# Patient Record
Sex: Male | Born: 1935
Health system: Southern US, Community
[De-identification: ages and names within clinical notes are randomized; demographics above are authoritative.]

## PROBLEM LIST (undated history)

## (undated) DIAGNOSIS — K589 Irritable bowel syndrome without diarrhea: Secondary | ICD-10-CM

## (undated) DIAGNOSIS — J310 Chronic rhinitis: Secondary | ICD-10-CM

## (undated) DIAGNOSIS — I10 Essential (primary) hypertension: Secondary | ICD-10-CM

## (undated) DIAGNOSIS — F419 Anxiety disorder, unspecified: Secondary | ICD-10-CM

## (undated) DIAGNOSIS — K5730 Diverticulosis of large intestine without perforation or abscess without bleeding: Secondary | ICD-10-CM

## (undated) DIAGNOSIS — I2510 Atherosclerotic heart disease of native coronary artery without angina pectoris: Secondary | ICD-10-CM

## (undated) DIAGNOSIS — Z87438 Personal history of other diseases of male genital organs: Secondary | ICD-10-CM

## (undated) DIAGNOSIS — I6529 Occlusion and stenosis of unspecified carotid artery: Secondary | ICD-10-CM

## (undated) DIAGNOSIS — Z9289 Personal history of other medical treatment: Secondary | ICD-10-CM

## (undated) DIAGNOSIS — K635 Polyp of colon: Secondary | ICD-10-CM

## (undated) DIAGNOSIS — E785 Hyperlipidemia, unspecified: Secondary | ICD-10-CM

## (undated) DIAGNOSIS — I6381 Other cerebral infarction due to occlusion or stenosis of small artery: Secondary | ICD-10-CM

## (undated) HISTORY — DX: Chronic rhinitis: J31.0

## (undated) HISTORY — DX: Atherosclerotic heart disease of native coronary artery without angina pectoris: I25.10

## (undated) HISTORY — DX: Personal history of other diseases of male genital organs: Z87.438

## (undated) HISTORY — DX: Anxiety disorder, unspecified: F41.9

## (undated) HISTORY — DX: Occlusion and stenosis of unspecified carotid artery: I65.29

## (undated) HISTORY — DX: Essential (primary) hypertension: I10

## (undated) HISTORY — DX: Polyp of colon: K63.5

## (undated) HISTORY — DX: Irritable bowel syndrome, unspecified: K58.9

## (undated) HISTORY — DX: Diverticulosis of large intestine without perforation or abscess without bleeding: K57.30

## (undated) HISTORY — DX: Hyperlipidemia, unspecified: E78.5

## (undated) HISTORY — DX: Personal history of other medical treatment: Z92.89

## (undated) HISTORY — DX: Other cerebral infarction due to occlusion or stenosis of small artery: I63.81

---
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
## 1973-05-17 HISTORY — PX: NASAL SINUS SURGERY: SHX719

## 1986-07-12 ENCOUNTER — Encounter (INDEPENDENT_AMBULATORY_CARE_PROVIDER_SITE_OTHER): Payer: Self-pay | Admitting: *Deleted

## 1993-02-16 ENCOUNTER — Encounter: Payer: Self-pay | Admitting: Internal Medicine

## 1993-04-20 ENCOUNTER — Encounter: Payer: Self-pay | Admitting: Internal Medicine

## 1997-10-04 ENCOUNTER — Encounter: Payer: Self-pay | Admitting: Internal Medicine

## 2001-09-28 ENCOUNTER — Encounter: Payer: Self-pay | Admitting: Internal Medicine

## 2003-01-23 ENCOUNTER — Encounter: Payer: Self-pay | Admitting: Internal Medicine

## 2003-02-01 ENCOUNTER — Ambulatory Visit (HOSPITAL_COMMUNITY): Admission: RE | Admit: 2003-02-01 | Discharge: 2003-02-01 | Payer: Self-pay | Admitting: Internal Medicine

## 2003-02-01 ENCOUNTER — Encounter: Payer: Self-pay | Admitting: Internal Medicine

## 2003-03-22 ENCOUNTER — Ambulatory Visit (HOSPITAL_COMMUNITY): Admission: RE | Admit: 2003-03-22 | Discharge: 2003-03-22 | Payer: Self-pay | Admitting: General Surgery

## 2003-05-18 HISTORY — PX: HERNIA REPAIR: SHX51

## 2004-03-03 ENCOUNTER — Ambulatory Visit: Payer: Self-pay | Admitting: Pain Medicine

## 2004-03-30 ENCOUNTER — Ambulatory Visit: Payer: Self-pay | Admitting: Internal Medicine

## 2004-04-14 ENCOUNTER — Ambulatory Visit: Payer: Self-pay | Admitting: Physician Assistant

## 2004-06-30 ENCOUNTER — Ambulatory Visit: Payer: Self-pay | Admitting: Physician Assistant

## 2004-08-05 ENCOUNTER — Ambulatory Visit: Payer: Self-pay | Admitting: Internal Medicine

## 2004-08-07 ENCOUNTER — Ambulatory Visit: Payer: Self-pay | Admitting: Internal Medicine

## 2004-08-10 ENCOUNTER — Ambulatory Visit: Payer: Self-pay | Admitting: Internal Medicine

## 2004-09-25 ENCOUNTER — Ambulatory Visit: Payer: Self-pay | Admitting: Physician Assistant

## 2005-03-12 ENCOUNTER — Ambulatory Visit: Payer: Self-pay | Admitting: Internal Medicine

## 2005-03-16 ENCOUNTER — Encounter: Admission: RE | Admit: 2005-03-16 | Discharge: 2005-03-16 | Payer: Self-pay | Admitting: Otolaryngology

## 2005-03-23 ENCOUNTER — Encounter (INDEPENDENT_AMBULATORY_CARE_PROVIDER_SITE_OTHER): Payer: Self-pay | Admitting: *Deleted

## 2005-03-23 ENCOUNTER — Ambulatory Visit: Payer: Self-pay | Admitting: Internal Medicine

## 2005-04-29 ENCOUNTER — Ambulatory Visit: Payer: Self-pay | Admitting: Internal Medicine

## 2005-05-02 ENCOUNTER — Encounter: Admission: RE | Admit: 2005-05-02 | Discharge: 2005-05-02 | Payer: Self-pay | Admitting: Internal Medicine

## 2005-09-03 ENCOUNTER — Ambulatory Visit: Payer: Self-pay | Admitting: Physician Assistant

## 2005-09-08 ENCOUNTER — Ambulatory Visit: Payer: Self-pay | Admitting: Physician Assistant

## 2005-09-15 ENCOUNTER — Encounter: Payer: Self-pay | Admitting: Pain Medicine

## 2005-10-25 ENCOUNTER — Ambulatory Visit: Payer: Self-pay | Admitting: Internal Medicine

## 2005-12-21 ENCOUNTER — Ambulatory Visit: Payer: Self-pay | Admitting: Physician Assistant

## 2006-02-21 ENCOUNTER — Ambulatory Visit: Payer: Self-pay | Admitting: Internal Medicine

## 2006-04-22 ENCOUNTER — Ambulatory Visit: Payer: Self-pay | Admitting: Physician Assistant

## 2006-05-11 ENCOUNTER — Ambulatory Visit: Payer: Self-pay | Admitting: Internal Medicine

## 2006-05-17 HISTORY — PX: OTHER SURGICAL HISTORY: SHX169

## 2006-05-17 HISTORY — PX: CORONARY STENT PLACEMENT: SHX1402

## 2006-06-20 ENCOUNTER — Ambulatory Visit: Payer: Self-pay | Admitting: Internal Medicine

## 2006-06-21 ENCOUNTER — Ambulatory Visit: Payer: Self-pay | Admitting: Internal Medicine

## 2006-06-21 LAB — CONVERTED CEMR LAB
BUN: 14 mg/dL (ref 6–23)
CO2: 30 meq/L (ref 19–32)
Calcium: 9 mg/dL (ref 8.4–10.5)
Chloride: 107 meq/L (ref 96–112)
Cortisol, Plasma: 12.1 ug/dL
Creatinine, Ser: 0.9 mg/dL (ref 0.4–1.5)
GFR calc Af Amer: 107 mL/min
GFR calc non Af Amer: 88 mL/min
Glucose, Bld: 84 mg/dL (ref 70–99)
Potassium: 4.1 meq/L (ref 3.5–5.1)
Sodium: 141 meq/L (ref 135–145)

## 2006-07-08 ENCOUNTER — Ambulatory Visit: Payer: Self-pay | Admitting: Internal Medicine

## 2006-08-15 ENCOUNTER — Emergency Department (HOSPITAL_COMMUNITY): Admission: EM | Admit: 2006-08-15 | Discharge: 2006-08-16 | Payer: Self-pay | Admitting: Emergency Medicine

## 2006-09-20 ENCOUNTER — Ambulatory Visit: Payer: Self-pay | Admitting: Internal Medicine

## 2006-09-20 LAB — CONVERTED CEMR LAB
Anti Nuclear Antibody(ANA): NEGATIVE
Basophils Absolute: 0 10*3/uL (ref 0.0–0.1)
Basophils Relative: 0.7 % (ref 0.0–1.0)
Eosinophils Absolute: 0.1 10*3/uL (ref 0.0–0.6)
Eosinophils Relative: 2.5 % (ref 0.0–5.0)
HCT: 41.1 % (ref 39.0–52.0)
Hemoglobin: 14.4 g/dL (ref 13.0–17.0)
Lymphocytes Relative: 21.3 % (ref 12.0–46.0)
MCHC: 34.9 g/dL (ref 30.0–36.0)
MCV: 90 fL (ref 78.0–100.0)
Monocytes Absolute: 0.4 10*3/uL (ref 0.2–0.7)
Monocytes Relative: 7.8 % (ref 3.0–11.0)
Neutro Abs: 3.9 10*3/uL (ref 1.4–7.7)
Neutrophils Relative %: 67.7 % (ref 43.0–77.0)
Platelets: 252 10*3/uL (ref 150–400)
RBC: 4.57 M/uL (ref 4.22–5.81)
RDW: 12.2 % (ref 11.5–14.6)
Sed Rate: 10 mm/hr (ref 0–20)
WBC: 5.6 10*3/uL (ref 4.5–10.5)

## 2006-10-13 ENCOUNTER — Ambulatory Visit: Payer: Self-pay | Admitting: Internal Medicine

## 2007-02-06 ENCOUNTER — Encounter: Payer: Self-pay | Admitting: *Deleted

## 2007-02-06 DIAGNOSIS — K589 Irritable bowel syndrome without diarrhea: Secondary | ICD-10-CM

## 2007-02-06 DIAGNOSIS — Z87898 Personal history of other specified conditions: Secondary | ICD-10-CM

## 2007-02-06 DIAGNOSIS — I10 Essential (primary) hypertension: Secondary | ICD-10-CM | POA: Insufficient documentation

## 2007-03-10 ENCOUNTER — Ambulatory Visit: Payer: Self-pay | Admitting: Internal Medicine

## 2007-03-10 LAB — CONVERTED CEMR LAB
ALT: 22 units/L (ref 0–53)
AST: 23 units/L (ref 0–37)
Albumin: 3.6 g/dL (ref 3.5–5.2)
Alkaline Phosphatase: 59 units/L (ref 39–117)
BUN: 12 mg/dL (ref 6–23)
Basophils Absolute: 0 10*3/uL (ref 0.0–0.1)
Basophils Relative: 0 % (ref 0.0–1.0)
Bilirubin Urine: NEGATIVE
Bilirubin, Direct: 0.1 mg/dL (ref 0.0–0.3)
CO2: 26 meq/L (ref 19–32)
Calcium: 9.4 mg/dL (ref 8.4–10.5)
Chloride: 107 meq/L (ref 96–112)
Cholesterol: 226 mg/dL (ref 0–200)
Creatinine, Ser: 1 mg/dL (ref 0.4–1.5)
Direct LDL: 132.2 mg/dL
Eosinophils Absolute: 0.2 10*3/uL (ref 0.0–0.6)
Eosinophils Relative: 3.5 % (ref 0.0–5.0)
GFR calc Af Amer: 95 mL/min
GFR calc non Af Amer: 78 mL/min
Glucose, Bld: 100 mg/dL — ABNORMAL HIGH (ref 70–99)
HCT: 41.4 % (ref 39.0–52.0)
HDL: 56 mg/dL (ref >39.0)
Hemoglobin, Urine: NEGATIVE
Hemoglobin: 14.3 g/dL (ref 13.0–17.0)
Ketones, ur: NEGATIVE mg/dL
Leukocytes, UA: NEGATIVE
Lymphocytes Relative: 29.7 % (ref 12.0–46.0)
MCHC: 34.6 g/dL (ref 30.0–36.0)
MCV: 91.2 fL (ref 78.0–100.0)
Monocytes Absolute: 0.4 10*3/uL (ref 0.2–0.7)
Monocytes Relative: 8.6 % (ref 3.0–11.0)
Neutro Abs: 2.6 10*3/uL (ref 1.4–7.7)
Neutrophils Relative %: 58.2 % (ref 43.0–77.0)
Nitrite: NEGATIVE
PSA: 1.58 ng/mL (ref 0.10–4.00)
Platelets: 191 10*3/uL (ref 150–400)
Potassium: 4 meq/L (ref 3.5–5.1)
RBC: 4.53 M/uL (ref 4.22–5.81)
RDW: 12.7 % (ref 11.5–14.6)
Sodium: 140 meq/L (ref 135–145)
Specific Gravity, Urine: 1.025 (ref 1.000–1.03)
TSH: 3.54 microintl units/mL (ref 0.35–5.50)
Total Bilirubin: 0.7 mg/dL (ref 0.3–1.2)
Total CHOL/HDL Ratio: 4
Total Protein, Urine: NEGATIVE mg/dL
Total Protein: 6.2 g/dL (ref 6.0–8.3)
Triglycerides: 184 mg/dL — ABNORMAL HIGH (ref 0–149)
Urine Glucose: NEGATIVE mg/dL
Urobilinogen, UA: 0.2 (ref 0.0–1.0)
VLDL: 37 mg/dL (ref 0–40)
WBC: 4.5 10*3/uL (ref 4.5–10.5)
pH: 6 (ref 5.0–8.0)

## 2007-03-16 ENCOUNTER — Ambulatory Visit: Payer: Self-pay | Admitting: Internal Medicine

## 2007-03-16 ENCOUNTER — Encounter: Payer: Self-pay | Admitting: Internal Medicine

## 2007-03-16 DIAGNOSIS — M542 Cervicalgia: Secondary | ICD-10-CM | POA: Insufficient documentation

## 2007-03-25 ENCOUNTER — Telehealth: Payer: Self-pay | Admitting: Internal Medicine

## 2007-03-25 ENCOUNTER — Observation Stay (HOSPITAL_COMMUNITY): Admission: EM | Admit: 2007-03-25 | Discharge: 2007-03-25 | Payer: Self-pay | Admitting: Emergency Medicine

## 2007-03-25 ENCOUNTER — Ambulatory Visit: Payer: Self-pay | Admitting: Internal Medicine

## 2007-03-25 DIAGNOSIS — I209 Angina pectoris, unspecified: Secondary | ICD-10-CM

## 2007-03-28 ENCOUNTER — Ambulatory Visit: Payer: Self-pay | Admitting: Cardiovascular Disease

## 2007-03-28 ENCOUNTER — Ambulatory Visit: Payer: Self-pay

## 2007-03-28 ENCOUNTER — Encounter: Payer: Self-pay | Admitting: Internal Medicine

## 2007-03-28 ENCOUNTER — Inpatient Hospital Stay (HOSPITAL_COMMUNITY): Admission: AD | Admit: 2007-03-28 | Discharge: 2007-03-30 | Payer: Self-pay | Admitting: Cardiovascular Disease

## 2007-04-05 ENCOUNTER — Ambulatory Visit: Payer: Self-pay | Admitting: Cardiovascular Disease

## 2007-04-05 ENCOUNTER — Ambulatory Visit: Payer: Self-pay

## 2007-04-05 ENCOUNTER — Ambulatory Visit: Payer: Self-pay | Admitting: Cardiology

## 2007-04-11 ENCOUNTER — Encounter (HOSPITAL_COMMUNITY): Admission: RE | Admit: 2007-04-11 | Discharge: 2007-05-17 | Payer: Self-pay | Admitting: Cardiovascular Disease

## 2007-04-18 ENCOUNTER — Ambulatory Visit: Payer: Self-pay | Admitting: Cardiovascular Disease

## 2007-04-26 ENCOUNTER — Ambulatory Visit: Payer: Self-pay | Admitting: Internal Medicine

## 2007-04-26 DIAGNOSIS — I2510 Atherosclerotic heart disease of native coronary artery without angina pectoris: Secondary | ICD-10-CM | POA: Insufficient documentation

## 2007-04-26 DIAGNOSIS — F4321 Adjustment disorder with depressed mood: Secondary | ICD-10-CM

## 2007-04-28 ENCOUNTER — Ambulatory Visit: Payer: Self-pay | Admitting: Cardiovascular Disease

## 2007-06-06 ENCOUNTER — Ambulatory Visit: Payer: Self-pay | Admitting: Cardiovascular Disease

## 2007-06-26 ENCOUNTER — Telehealth: Payer: Self-pay | Admitting: Internal Medicine

## 2007-06-28 ENCOUNTER — Encounter: Payer: Self-pay | Admitting: Internal Medicine

## 2007-07-06 ENCOUNTER — Telehealth: Payer: Self-pay | Admitting: Internal Medicine

## 2007-07-25 ENCOUNTER — Ambulatory Visit: Payer: Self-pay | Admitting: Cardiovascular Disease

## 2007-08-21 ENCOUNTER — Ambulatory Visit: Payer: Self-pay | Admitting: Cardiovascular Disease

## 2007-08-21 LAB — CONVERTED CEMR LAB
ALT: 36 units/L (ref 0–53)
AST: 26 units/L (ref 0–37)
Albumin: 3.6 g/dL (ref 3.5–5.2)
Alkaline Phosphatase: 53 units/L (ref 39–117)
Bilirubin, Direct: 0.1 mg/dL (ref 0.0–0.3)
Cholesterol: 121 mg/dL (ref 0–200)
HDL: 57.4 mg/dL (ref >39.0)
LDL Cholesterol: 37 mg/dL (ref 0–99)
Total Bilirubin: 0.9 mg/dL (ref 0.3–1.2)
Total CHOL/HDL Ratio: 2.1
Total Protein: 6.4 g/dL (ref 6.0–8.3)
Triglycerides: 134 mg/dL (ref 0–149)
VLDL: 27 mg/dL (ref 0–40)

## 2007-09-18 ENCOUNTER — Ambulatory Visit: Payer: Self-pay | Admitting: Internal Medicine

## 2007-09-28 ENCOUNTER — Ambulatory Visit: Payer: Medicare Other | Admitting: Physician Assistant

## 2007-10-04 ENCOUNTER — Telehealth (INDEPENDENT_AMBULATORY_CARE_PROVIDER_SITE_OTHER): Payer: Self-pay | Admitting: *Deleted

## 2007-10-23 ENCOUNTER — Ambulatory Visit: Payer: Self-pay | Admitting: Cardiovascular Disease

## 2007-11-08 ENCOUNTER — Telehealth (INDEPENDENT_AMBULATORY_CARE_PROVIDER_SITE_OTHER): Payer: Self-pay | Admitting: *Deleted

## 2007-11-09 ENCOUNTER — Ambulatory Visit: Payer: Medicare Other | Admitting: Pain Medicine

## 2007-11-16 ENCOUNTER — Ambulatory Visit: Payer: Self-pay | Admitting: Cardiovascular Disease

## 2007-11-16 ENCOUNTER — Encounter: Payer: Self-pay | Admitting: Internal Medicine

## 2007-11-27 ENCOUNTER — Ambulatory Visit: Payer: Medicare Other | Admitting: Physician Assistant

## 2007-12-04 ENCOUNTER — Encounter: Payer: Self-pay | Admitting: Internal Medicine

## 2007-12-14 ENCOUNTER — Ambulatory Visit: Payer: Medicare Other | Admitting: Pain Medicine

## 2008-01-15 ENCOUNTER — Ambulatory Visit: Payer: Self-pay | Admitting: Cardiovascular Disease

## 2008-01-23 ENCOUNTER — Ambulatory Visit: Payer: Self-pay | Admitting: Cardiovascular Disease

## 2008-02-27 ENCOUNTER — Ambulatory Visit: Payer: Self-pay | Admitting: Internal Medicine

## 2008-03-14 ENCOUNTER — Ambulatory Visit: Payer: Medicare Other | Admitting: Pain Medicine

## 2008-04-24 ENCOUNTER — Ambulatory Visit: Payer: Self-pay | Admitting: Internal Medicine

## 2008-04-26 ENCOUNTER — Ambulatory Visit: Payer: Self-pay | Admitting: Internal Medicine

## 2008-04-26 LAB — CONVERTED CEMR LAB
ALT: 34 units/L (ref 0–53)
AST: 24 units/L (ref 0–37)
Albumin: 3.9 g/dL (ref 3.5–5.2)
Alkaline Phosphatase: 50 units/L (ref 39–117)
BUN: 14 mg/dL (ref 6–23)
Bilirubin, Direct: 0.1 mg/dL (ref 0.0–0.3)
CO2: 27 meq/L (ref 19–32)
Calcium: 9.5 mg/dL (ref 8.4–10.5)
Chloride: 105 meq/L (ref 96–112)
Cholesterol: 132 mg/dL (ref 0–200)
Creatinine, Ser: 1.1 mg/dL (ref 0.4–1.5)
GFR calc Af Amer: 85 mL/min
GFR calc non Af Amer: 70 mL/min
Glucose, Bld: 97 mg/dL (ref 70–99)
HDL: 80.2 mg/dL (ref >39.0)
LDL Cholesterol: 36 mg/dL (ref 0–99)
PSA: 0.57 ng/mL (ref 0.10–4.00)
Potassium: 4.1 meq/L (ref 3.5–5.1)
Sodium: 140 meq/L (ref 135–145)
TSH: 1.72 microintl units/mL (ref 0.35–5.50)
Total Bilirubin: 0.7 mg/dL (ref 0.3–1.2)
Total CHOL/HDL Ratio: 1.6
Total Protein: 6.5 g/dL (ref 6.0–8.3)
Triglycerides: 81 mg/dL (ref 0–149)
VLDL: 16 mg/dL (ref 0–40)

## 2008-04-30 ENCOUNTER — Ambulatory Visit: Payer: Self-pay | Admitting: Cardiovascular Disease

## 2008-06-13 ENCOUNTER — Encounter: Payer: Self-pay | Admitting: Internal Medicine

## 2008-06-17 DIAGNOSIS — E785 Hyperlipidemia, unspecified: Secondary | ICD-10-CM

## 2008-06-18 ENCOUNTER — Ambulatory Visit: Payer: Self-pay | Admitting: Cardiovascular Disease

## 2008-06-24 ENCOUNTER — Telehealth: Payer: Self-pay | Admitting: Internal Medicine

## 2008-06-26 ENCOUNTER — Encounter: Payer: Self-pay | Admitting: Internal Medicine

## 2008-07-23 ENCOUNTER — Ambulatory Visit: Payer: Self-pay | Admitting: Cardiovascular Disease

## 2008-09-09 ENCOUNTER — Encounter: Payer: Self-pay | Admitting: Internal Medicine

## 2008-10-21 ENCOUNTER — Ambulatory Visit: Payer: Self-pay | Admitting: Cardiovascular Disease

## 2008-11-14 ENCOUNTER — Telehealth (INDEPENDENT_AMBULATORY_CARE_PROVIDER_SITE_OTHER): Payer: Self-pay | Admitting: *Deleted

## 2008-11-19 ENCOUNTER — Ambulatory Visit: Payer: Self-pay

## 2008-11-19 ENCOUNTER — Encounter: Payer: Self-pay | Admitting: Cardiology

## 2009-01-21 ENCOUNTER — Ambulatory Visit: Payer: Self-pay | Admitting: Cardiovascular Disease

## 2009-03-14 ENCOUNTER — Telehealth: Payer: Self-pay | Admitting: Internal Medicine

## 2009-03-27 ENCOUNTER — Encounter: Payer: Self-pay | Admitting: Cardiology

## 2009-03-28 ENCOUNTER — Encounter (INDEPENDENT_AMBULATORY_CARE_PROVIDER_SITE_OTHER): Payer: Self-pay | Admitting: *Deleted

## 2009-04-15 ENCOUNTER — Encounter: Payer: Self-pay | Admitting: Cardiology

## 2009-04-16 ENCOUNTER — Encounter: Payer: Self-pay | Admitting: Cardiology

## 2009-04-16 ENCOUNTER — Ambulatory Visit: Payer: Self-pay | Admitting: Cardiovascular Disease

## 2009-04-16 LAB — CONVERTED CEMR LAB
BUN: 13 mg/dL (ref 6–23)
Basophils Absolute: 0 10*3/uL (ref 0.0–0.1)
Basophils Relative: 0.2 % (ref 0.0–3.0)
CO2: 24 meq/L (ref 19–32)
Calcium: 9.3 mg/dL (ref 8.4–10.5)
Chloride: 109 meq/L (ref 96–112)
Creatinine, Ser: 1 mg/dL (ref 0.4–1.5)
Eosinophils Absolute: 0.2 10*3/uL (ref 0.0–0.7)
Eosinophils Relative: 2.3 % (ref 0.0–5.0)
GFR calc non Af Amer: 77.65 mL/min (ref >60)
Glucose, Bld: 87 mg/dL (ref 70–99)
HCT: 41.8 % (ref 39.0–52.0)
Hemoglobin: 14.4 g/dL (ref 13.0–17.0)
INR: 1 (ref 0.8–1.0)
Lymphocytes Relative: 13.5 % (ref 12.0–46.0)
Lymphs Abs: 1.1 10*3/uL (ref 0.7–4.0)
MCHC: 34.5 g/dL (ref 30.0–36.0)
MCV: 94.1 fL (ref 78.0–100.0)
Monocytes Absolute: 0.6 10*3/uL (ref 0.1–1.0)
Monocytes Relative: 6.7 % (ref 3.0–12.0)
Neutro Abs: 6.6 10*3/uL (ref 1.4–7.7)
Neutrophils Relative %: 77.3 % — ABNORMAL HIGH (ref 43.0–77.0)
Platelets: 192 10*3/uL (ref 150.0–400.0)
Potassium: 4.1 meq/L (ref 3.5–5.1)
Prothrombin Time: 10.1 s (ref 9.1–11.7)
RBC: 4.44 M/uL (ref 4.22–5.81)
RDW: 12.6 % (ref 11.5–14.6)
Sodium: 139 meq/L (ref 135–145)
WBC: 8.5 10*3/uL (ref 4.5–10.5)
aPTT: 30.6 s — ABNORMAL HIGH (ref 21.7–28.8)

## 2009-04-22 ENCOUNTER — Ambulatory Visit: Payer: Self-pay | Admitting: Cardiology

## 2009-04-22 ENCOUNTER — Inpatient Hospital Stay (HOSPITAL_BASED_OUTPATIENT_CLINIC_OR_DEPARTMENT_OTHER): Admission: RE | Admit: 2009-04-22 | Discharge: 2009-04-22 | Payer: Self-pay | Admitting: Cardiology

## 2009-04-28 ENCOUNTER — Ambulatory Visit: Payer: Self-pay | Admitting: Internal Medicine

## 2009-04-28 LAB — CONVERTED CEMR LAB
ALT: 27 units/L (ref 0–53)
AST: 21 units/L (ref 0–37)
Albumin: 3.8 g/dL (ref 3.5–5.2)
Alkaline Phosphatase: 51 units/L (ref 39–117)
Bilirubin, Direct: 0.1 mg/dL (ref 0.0–0.3)
Cholesterol: 122 mg/dL (ref 0–200)
HDL: 69.8 mg/dL (ref >39.00)
LDL Cholesterol: 37 mg/dL (ref 0–99)
PSA: 0.59 ng/mL (ref 0.10–4.00)
Total Bilirubin: 0.8 mg/dL (ref 0.3–1.2)
Total CHOL/HDL Ratio: 2
Total Protein: 6.6 g/dL (ref 6.0–8.3)
Triglycerides: 76 mg/dL (ref 0.0–149.0)
VLDL: 15.2 mg/dL (ref 0.0–40.0)

## 2009-04-30 ENCOUNTER — Ambulatory Visit: Payer: Self-pay | Admitting: Cardiology

## 2009-04-30 ENCOUNTER — Ambulatory Visit: Payer: Self-pay | Admitting: Cardiovascular Disease

## 2009-05-02 ENCOUNTER — Telehealth: Payer: Self-pay | Admitting: Cardiovascular Disease

## 2009-05-08 ENCOUNTER — Telehealth: Payer: Self-pay | Admitting: Cardiovascular Disease

## 2009-06-23 ENCOUNTER — Telehealth: Payer: Self-pay | Admitting: Internal Medicine

## 2009-07-21 ENCOUNTER — Telehealth: Payer: Self-pay | Admitting: Internal Medicine

## 2009-07-25 ENCOUNTER — Ambulatory Visit: Payer: Self-pay | Admitting: Cardiovascular Disease

## 2009-08-04 ENCOUNTER — Encounter (INDEPENDENT_AMBULATORY_CARE_PROVIDER_SITE_OTHER): Payer: Self-pay | Admitting: *Deleted

## 2009-08-04 LAB — CONVERTED CEMR LAB
ALT: 24 units/L (ref 0–53)
AST: 22 units/L (ref 0–37)
Albumin: 3.8 g/dL (ref 3.5–5.2)
Alkaline Phosphatase: 52 units/L (ref 39–117)
Bilirubin, Direct: 0.2 mg/dL (ref 0.0–0.3)
Cholesterol: 108 mg/dL (ref 0–200)
HDL: 66.2 mg/dL (ref >39.00)
LDL Cholesterol: 22 mg/dL (ref 0–99)
Total Bilirubin: 0.6 mg/dL (ref 0.3–1.2)
Total CHOL/HDL Ratio: 2
Total Protein: 6.7 g/dL (ref 6.0–8.3)
Triglycerides: 98 mg/dL (ref 0.0–149.0)
VLDL: 19.6 mg/dL (ref 0.0–40.0)

## 2009-08-07 ENCOUNTER — Telehealth: Payer: Self-pay | Admitting: Internal Medicine

## 2009-08-07 ENCOUNTER — Ambulatory Visit: Payer: Self-pay | Admitting: Cardiovascular Disease

## 2009-09-11 ENCOUNTER — Telehealth: Payer: Self-pay | Admitting: Internal Medicine

## 2010-01-15 ENCOUNTER — Encounter: Payer: Self-pay | Admitting: Cardiology

## 2010-01-15 ENCOUNTER — Ambulatory Visit: Payer: Self-pay | Admitting: Cardiovascular Disease

## 2010-02-03 ENCOUNTER — Ambulatory Visit: Payer: Self-pay | Admitting: Cardiology

## 2010-02-03 ENCOUNTER — Ambulatory Visit (HOSPITAL_COMMUNITY): Admission: RE | Admit: 2010-02-03 | Discharge: 2010-02-03 | Payer: Self-pay | Admitting: Cardiology

## 2010-02-03 ENCOUNTER — Encounter: Payer: Self-pay | Admitting: Cardiology

## 2010-02-03 ENCOUNTER — Telehealth (INDEPENDENT_AMBULATORY_CARE_PROVIDER_SITE_OTHER): Payer: Self-pay | Admitting: *Deleted

## 2010-02-03 ENCOUNTER — Ambulatory Visit: Payer: Self-pay

## 2010-02-04 ENCOUNTER — Encounter: Payer: Self-pay | Admitting: Cardiology

## 2010-02-11 ENCOUNTER — Telehealth (INDEPENDENT_AMBULATORY_CARE_PROVIDER_SITE_OTHER): Payer: Self-pay | Admitting: *Deleted

## 2010-02-14 ENCOUNTER — Telehealth (INDEPENDENT_AMBULATORY_CARE_PROVIDER_SITE_OTHER): Payer: Self-pay | Admitting: *Deleted

## 2010-02-17 ENCOUNTER — Encounter: Payer: Self-pay | Admitting: Internal Medicine

## 2010-02-19 ENCOUNTER — Encounter (INDEPENDENT_AMBULATORY_CARE_PROVIDER_SITE_OTHER): Payer: Self-pay | Admitting: *Deleted

## 2010-02-23 ENCOUNTER — Encounter: Payer: Self-pay | Admitting: Internal Medicine

## 2010-02-24 ENCOUNTER — Ambulatory Visit: Payer: Self-pay | Admitting: Diagnostic Neuroimaging

## 2010-02-24 ENCOUNTER — Encounter: Payer: Self-pay | Admitting: Cardiology

## 2010-02-27 ENCOUNTER — Encounter (INDEPENDENT_AMBULATORY_CARE_PROVIDER_SITE_OTHER): Payer: Self-pay | Admitting: *Deleted

## 2010-03-03 ENCOUNTER — Encounter: Payer: Self-pay | Admitting: Cardiology

## 2010-03-05 ENCOUNTER — Telehealth: Payer: Self-pay | Admitting: Cardiovascular Disease

## 2010-03-09 ENCOUNTER — Telehealth (INDEPENDENT_AMBULATORY_CARE_PROVIDER_SITE_OTHER): Payer: Self-pay | Admitting: *Deleted

## 2010-04-13 ENCOUNTER — Encounter (INDEPENDENT_AMBULATORY_CARE_PROVIDER_SITE_OTHER): Payer: Self-pay | Admitting: *Deleted

## 2010-04-15 ENCOUNTER — Ambulatory Visit: Payer: Self-pay | Admitting: Internal Medicine

## 2010-04-15 DIAGNOSIS — Z860100 Personal history of colon polyps, unspecified: Secondary | ICD-10-CM | POA: Insufficient documentation

## 2010-05-07 ENCOUNTER — Ambulatory Visit: Payer: Self-pay | Admitting: Internal Medicine

## 2010-05-07 LAB — CONVERTED CEMR LAB
ALT: 21 units/L (ref 0–53)
AST: 22 units/L (ref 0–37)
Albumin: 4 g/dL (ref 3.5–5.2)
Alkaline Phosphatase: 57 units/L (ref 39–117)
BUN: 14 mg/dL (ref 6–23)
Basophils Absolute: 0 10*3/uL (ref 0.0–0.1)
Basophils Relative: 0.5 % (ref 0.0–3.0)
Bilirubin, Direct: 0.1 mg/dL (ref 0.0–0.3)
CO2: 27 meq/L (ref 19–32)
Calcium: 9.6 mg/dL (ref 8.4–10.5)
Chloride: 106 meq/L (ref 96–112)
Cholesterol: 125 mg/dL (ref 0–200)
Creatinine, Ser: 1 mg/dL (ref 0.4–1.5)
Eosinophils Absolute: 0.2 10*3/uL (ref 0.0–0.7)
Eosinophils Relative: 3.6 % (ref 0.0–5.0)
GFR calc non Af Amer: 79.25 mL/min (ref >60.00)
Glucose, Bld: 86 mg/dL (ref 70–99)
HCT: 40.4 % (ref 39.0–52.0)
HDL: 57.3 mg/dL (ref >39.00)
Hemoglobin: 14 g/dL (ref 13.0–17.0)
LDL Cholesterol: 43 mg/dL (ref 0–99)
Lymphocytes Relative: 22.7 % (ref 12.0–46.0)
Lymphs Abs: 1.3 10*3/uL (ref 0.7–4.0)
MCHC: 34.6 g/dL (ref 30.0–36.0)
MCV: 91.2 fL (ref 78.0–100.0)
Monocytes Absolute: 0.6 10*3/uL (ref 0.1–1.0)
Monocytes Relative: 10.6 % (ref 3.0–12.0)
Neutro Abs: 3.4 10*3/uL (ref 1.4–7.7)
Neutrophils Relative %: 62.6 % (ref 43.0–77.0)
PSA: 0.55 ng/mL (ref 0.10–4.00)
Platelets: 171 10*3/uL (ref 150.0–400.0)
Potassium: 4.5 meq/L (ref 3.5–5.1)
RBC: 4.43 M/uL (ref 4.22–5.81)
RDW: 13.1 % (ref 11.5–14.6)
Sodium: 139 meq/L (ref 135–145)
TSH: 2.02 microintl units/mL (ref 0.35–5.50)
Total Bilirubin: 0.7 mg/dL (ref 0.3–1.2)
Total CHOL/HDL Ratio: 2
Total Protein: 6.4 g/dL (ref 6.0–8.3)
Triglycerides: 125 mg/dL (ref 0.0–149.0)
VLDL: 25 mg/dL (ref 0.0–40.0)
WBC: 5.5 10*3/uL (ref 4.5–10.5)

## 2010-05-13 ENCOUNTER — Encounter: Payer: Self-pay | Admitting: Internal Medicine

## 2010-05-19 ENCOUNTER — Telehealth: Payer: Self-pay | Admitting: Internal Medicine

## 2010-05-27 ENCOUNTER — Ambulatory Visit
Admission: RE | Admit: 2010-05-27 | Discharge: 2010-05-27 | Payer: Self-pay | Source: Home / Self Care | Attending: Internal Medicine | Admitting: Internal Medicine

## 2010-05-27 ENCOUNTER — Encounter: Payer: Self-pay | Admitting: Internal Medicine

## 2010-06-02 ENCOUNTER — Encounter: Payer: Self-pay | Admitting: Internal Medicine

## 2010-06-14 LAB — CONVERTED CEMR LAB
BUN: 15 mg/dL (ref 6–23)
CO2: 25 meq/L (ref 19–32)
Calcium: 9.8 mg/dL (ref 8.4–10.5)
Chloride: 108 meq/L (ref 96–112)
Creatinine, Ser: 1 mg/dL (ref 0.4–1.5)
GFR calc non Af Amer: 74.05 mL/min (ref >60)
Glucose, Bld: 83 mg/dL (ref 70–99)
Potassium: 4.2 meq/L (ref 3.5–5.1)
Sodium: 141 meq/L (ref 135–145)

## 2010-06-16 NOTE — Letter (Signed)
Summary: [facility] Office Visit Note   [facility] Office Visit Note   Imported By: Roderic Ovens 02/20/2010 13:54:39  _____________________________________________________________________  External Attachment:    Type:   Image     Comment:   External Document

## 2010-06-16 NOTE — Procedures (Signed)
Summary: Colonoscopy/[place]  Colonoscopy/[place]   Imported By: Sherian Rein 04/16/2010 10:24:40  _____________________________________________________________________  External Attachment:    Type:   Image     Comment:   External Document

## 2010-06-16 NOTE — Procedures (Signed)
Summary: Colonoscopy   Colonoscopy  Procedure date:  09/28/2001  Findings:      Location:  [facility].  Results: Hemorrhoids.     Results: Diverticulosis.       Results: Polyp. Tubular Adenoma   Procedures Next Due Date:    Colonoscopy: 09/2004 Patient Name: [name] [name], [name] [name] MRN: [id number] Procedure Procedures: Colonoscopy CPT: 62130.    with polypectomy. CPT: A3573898.  Personnel: Endoscopist: Wilhemina Bonito. Marina Goodell, MD.  Referred By: Rosalyn Gess. Norins, MD.  Exam Location: Exam performed in Outpatient Clinic. Outpatient  Patient Consent: Procedure, Alternatives, Risks and Benefits discussed, consent obtained, from patient. Consent was obtained by the RN.  Indications  Average Risk Screening Routine.  History  Pre-Exam Physical: Performed Sep 28, 2001. Entire physical exam was normal.  Exam Exam: Extent of exam reached: Cecum, extent intended: Cecum.  The cecum was identified by appendiceal orifice and IC valve. Patient position: on left side. Colon retroflexion performed. Images taken. ASA Classification: I. Tolerance: good.  Monitoring: Pulse and BP monitoring, Oximetry used. Supplemental O2 given.  Colon Prep Used Golytely for colon prep. Prep results: excellent.  Sedation Meds: Patient assessed and found to be appropriate for moderate (conscious) sedation. Fentanyl 100 mcg. given IV. Versed 5 mg. given IV.  Findings MULTIPLE POLYPS: Splenic Flexure. minimum size 2 mm, maximum size 4 mm. Procedure:  snare with cautery, removed, Polyp retrieved, 1 polyps ICD9: Colon Polyps: 211.3.  NORMAL EXAM: Cecum.  - DIVERTICULOSIS: Descending Colon to Sigmoid Colon. ICD9: Diverticulosis, Colon: 562.10.  HEMORRHOIDS: Internal and External. ICD9: Hemorrhoids, Internal and  External: 455.6.   Assessment Abnormal examination, see findings above.  Diagnoses: 211.3: Colon Polyps.  562.10: Diverticulosis, Colon.  455.6: Hemorrhoids, Internal and  External.    Events  Unplanned Interventions: No intervention was required.  Unplanned Events: There were no complications. Plans Patient Education: Patient given standard instructions for: Polyps.  Disposition: After procedure patient sent to recovery. After recovery patient sent home.  Scheduling/Referral: Colonoscopy, to Wilhemina Bonito. Marina Goodell, MD, in 3 years,    This report was created from the original endoscopy report, which was reviewed and signed by the above listed endoscopist.    cc:  Illene Regulus, MD

## 2010-06-16 NOTE — Procedures (Signed)
Summary: Summary Report  Summary Report   Imported By: Erle Crocker 03/04/2010 15:33:28  _____________________________________________________________________  External Attachment:    Type:   Image     Comment:   External Document

## 2010-06-16 NOTE — Assessment & Plan Note (Signed)
Summary: add on  Medications Added ASPIRIN EC 325 MG TBEC (ASPIRIN) Take one tablet by mouth daily      Allergies Added: NKDA  Primary Provider:  Norins   History of Present Illness: [name] [name] walks in today with a chief complaint of tunnel vision, bright lights in his eyes with scintillation of his vision.  He arose this morning and took his medications. He was sitting at the computer when he noticed he couldn't see the screen are read the words. He denied any weakness or numbness in his extremities or face. He denied any diplopia, slurred speech, dysarthria, difficulty swallowing, or imbalance when he stood up. He did not notice any change in his hearing that he did have a swishing sound in his head. He denies any headache other than minimal posterior occipital headache. It radiated to his left shoulder. It is now resolved.  Had no nausea, vomiting. He denies any fever chills or sweats.  There is no history of cerebrovascular disease. He takes an aspirin every day. He has multiple risk factors including coronary artery disease.  By the time he arrived to the clinic his symptoms had resolved. He says he is fine now. Symptoms lasted less than a hour.     Current Medications (verified): 1)  Calcium 600 Mg  Tabs (Calcium) .... Once Daily 2)  Garlic-Lecthin 956-213 Mg  Caps (Garlic-Lecthin) .... Once Daily 3)  Flomax 0.4 Mg  Cp24 (Tamsulosin Hcl) .Marland Kitchen.. 1 Once Daily 4)  Aspirin 81 Mg  Tabs (Aspirin) .... Take 1 Tablet By Mouth Once A Day 5)  Avodart 0.5 Mg  Caps (Dutasteride) .... Once Daily 6)  Lisinopril 10 Mg Tabs (Lisinopril) .... Take 1 Tablet By Mouth Once A Day 7)  Zolpidem Tartrate 10 Mg Tabs (Zolpidem Tartrate) .... As Needed 8)  Crestor 40 Mg Tabs (Rosuvastatin Calcium) .... Take One Tablet By Mouth Daily. 9)  Alprazolam 0.25 Mg Tabs (Alprazolam) .... 1/2 To 1 As Needed  Allergies (verified): No Known Drug Allergies  Past History:  Past Medical History: Last updated:  06/17/2008 UCD BENIGN PROSTATIC HYPERTROPHY, HX OF (ICD-V13.8) IBS (ICD-564.1) HYPERTENSION (ICD-401.9) anxiety rhinnitis Physician roster;                            GI- Dr. Landry Corporal - Dr. Nicholas Lose                             Psych- Dr Babs Sciara CAD: drug eluting stent LAD 03/2007 Brodie Hyperlipidemia  Past Surgical History: Last updated: 06/17/2008 Appendectomy 1956 sinus surgery 1975  Family History: Last updated: 03/16/2007 neg for lung, colon cancer; neg for CAD, DM, HTN  Social History: Last updated: 04/28/2009 [employer] grad. married '62  1 son- Chief Technology Officer x 12 years, now works with dad; 1 daughter-PA, 1 daughter - Hydrologist; 1 grand-daughter (Nov 15, '09) work: Copywriter, advertising and self-employed. Doing well. Reports that he spends a good deal of time at his home in [place] which he greatly enjoys.  Risk Factors: Alcohol Use: 1 (04/26/2008) Caffeine Use: 0 (04/26/2008) Exercise: yes (04/26/2008)  Risk Factors: Smoking Status: never (02/06/2007) Passive Smoke Exposure: no (03/16/2007)  Review of Systems       negative other than history of present  illness  Physical Exam  General:  well-developed well-nourished no acute distress, alert and oriented x3 Head:  normocephalic and atraumatic Eyes:  PERRLA/EOM intact; conjunctiva and lids normal. Mouth:  Teeth, gums and palate normal. Oral mucosa normal. Neck:  Neck supple, no JVD. No masses, thyromegaly or abnormal cervical nodes. Chest Wall:  no deformities or breast masses noted Lungs:  Clear bilaterally to auscultation and percussion. Heart:  MI nondisplaced, regular rate and rhythm, normal S1-S2, no carotid bruits Msk:  Back normal, normal gait. Muscle strength and tone normal. Pulses:  pulses normal in all 4 extremities Extremities:  No clubbing or cyanosis. Neurologic:  Alert and oriented x 3. motor, sensory, and gait intact. Cranial nerves grossly intact. Skin:  Intact  without lesions or rashes. Psych:  Normal affect.   EKG  Procedure date:  02/03/2010  Findings:      normal sinus rhythm, normal EKG showed an incomplete right bundle, stable, second EKG showed a PVC  Impression & Recommendations:  Problem # 1:  TIA (ICD-435.9) Assessment New I am suspicious that he had a posterior or basilar TIA. I spoke to Dr. Junie Bame of neurology. He advised that I increase his aspirin to 325 mg a day, schedule her MRI and MRA of the neck and head which is to be this afternoon at 5 PM, echocardiogram which is scheduled for 2 PM today. He is also been told as has his son is here with him today that if his symptoms recur he should go to emergency room and take an extra aspirin. All questions were answered.  Other Orders: EKG w/ Interpretation (93000) TLB-BMP (Basic Metabolic Panel-BMET) (80048-METABOL) MRA (MRA) MRI (MRI) Echocardiogram (Echo)  Patient Instructions: 1)  MRI/MRA of head and neck 2)  Your physician has requested that you have an echocardiogram.  Echocardiography is a painless test that uses sound waves to create images of your heart. It provides your doctor with information about the size and shape of your heart and how well your heart's chambers and valves are working.  This procedure takes approximately one hour. There are no restrictions for this procedure. 3)  Increase your Aspirin to 325mg  daily 4)  You have been referred to Dr Thad Ranger at [facility], if you have not heard from them please give our office a call back.

## 2010-06-16 NOTE — Assessment & Plan Note (Signed)
Summary: 6 month return.amber      Allergies Added: NKDA  Primary Provider:  Norins  CC:  check up.  History of Present Illness: [name] [name] is seen today for F/U of elevated lipids and CAD.  He has a stent to the LAD which was widely patient by recent cath 12/10  as part of the [employer] Trial.  He denies SSCP.  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  He is compliant with his meds and has less myalgias on Crestor.  His BP is under good control and he is trying to limit his sodium intake  He sees Dr Arthur Holms who follows his prostatism.    Current Problems (verified): 1)  Unspecified Chronic Ischemic Heart Disease  (ICD-414.9) 2)  Hyperlipidemia-mixed  (ICD-272.4) 3)  Cad  (ICD-414.00) 4)  Adjustment Disorder With Depressed Mood  (ICD-309.0) 5)  Angina, Atypical  (ICD-413.9) 6)  Neck Pain  (ICD-723.1) 7)  Benign Prostatic Hypertrophy, Hx of  (ICD-V13.8) 8)  Ibs  (ICD-564.1) 9)  Hypertension  (ICD-401.9)  Current Medications (verified): 1)  Calcium 600 Mg  Tabs (Calcium) .... Once Daily 2)  Garlic-Lecthin 573-220 Mg  Caps (Garlic-Lecthin) .... Once Daily 3)  Flomax 0.4 Mg  Cp24 (Tamsulosin Hcl) .Marland Kitchen.. 1 Once Daily 4)  Aspirin 81 Mg  Tabs (Aspirin) .... Take 1 Tablet By Mouth Once A Day 5)  Avodart 0.5 Mg  Caps (Dutasteride) .... Once Daily 6)  Lisinopril 10 Mg Tabs (Lisinopril) .... Take 1 Tablet By Mouth Once A Day 7)  Zolpidem Tartrate 10 Mg Tabs (Zolpidem Tartrate) .... As Needed 8)  Crestor 40 Mg Tabs (Rosuvastatin Calcium) .... Take One Tablet By Mouth Daily. 9)  Alprazolam 0.25 Mg Tabs (Alprazolam) .... 1/2 To 1 As Needed  Allergies (verified): No Known Drug Allergies  Past History:  Past Medical History: Last updated: 06/17/2008 UCD BENIGN PROSTATIC HYPERTROPHY, HX OF (ICD-V13.8) IBS (ICD-564.1) HYPERTENSION (ICD-401.9) anxiety rhinnitis Physician roster;                            GI- Dr. Landry Corporal - Dr. Nicholas Lose                             Psych- Dr Babs Sciara CAD: drug eluting stent LAD 03/2007 Brodie Hyperlipidemia  Past Surgical History: Last updated: 06/17/2008 Appendectomy 1956 sinus surgery 1975  Family History: Last updated: 03/16/2007 neg for lung, colon cancer; neg for CAD, DM, HTN  Social History: Last updated: 04/28/2009 [employer] grad. married '62  1 son- Chief Technology Officer x 12 years, now works with dad; 1 daughter-PA, 1 daughter - Hydrologist; 1 grand-daughter (Nov 15, '09) work: Copywriter, advertising and self-employed. Doing well. Reports that he spends a good deal of time at his home in [place] which he greatly enjoys.  Review of Systems       Denies fever, malais, weight loss, blurry vision, decreased visual acuity, cough, sputum, SOB, hemoptysis, pleuritic pain, palpitaitons, heartburn, abdominal pain, melena, lower extremity edema, claudication, or rash.   Vital Signs:  Patient profile:   77 year old male Height:      69 inches Weight:      152 pounds BMI:     22.53 Pulse rate:   70 / minute Resp:  12 per minute BP sitting:   110 / 70  (left arm)  Vitals Entered By: Kem Parkinson (January 15, 2010 4:37 PM)  Physical Exam  General:  Affect appropriate Healthy:  appears stated age HEENT: normal Neck supple with no adenopathy JVP normal no bruits no thyromegaly Lungs clear with no wheezing and good diaphragmatic motion Heart:  S1/S2 no murmur,rub, gallop or click PMI normal Abdomen: benighn, BS positve, no tenderness, no AAA no bruit.  No HSM or HJR Distal pulses intact with no bruits No edema Neuro non-focal Skin warm and dry    Impression & Recommendations:  Problem # 1:  HYPERLIPIDEMIA-MIXED (ICD-272.4) Well controlled labs per Norrins His updated medication list for this problem includes:    Crestor 40 Mg Tabs (Rosuvastatin calcium) .Marland Kitchen... Take one tablet by mouth daily.  CHOL: 108 (07/25/2009)   LDL: 22  (07/25/2009)   HDL: 66.20 (07/25/2009)   TG: 98.0 (07/25/2009)  Problem # 2:  CAD (ICD-414.00) Stent to LAD 2008  patent on cath 2010 Continue ASA His updated medication list for this problem includes:    Aspirin 81 Mg Tabs (Aspirin) .Marland Kitchen... Take 1 tablet by mouth once a day    Lisinopril 10 Mg Tabs (Lisinopril) .Marland Kitchen... Take 1 tablet by mouth once a day  Problem # 3:  HYPERTENSION (ICD-401.9) Well controlled continue ACE His updated medication list for this problem includes:    Aspirin 81 Mg Tabs (Aspirin) .Marland Kitchen... Take 1 tablet by mouth once a day    Lisinopril 10 Mg Tabs (Lisinopril) .Marland Kitchen... Take 1 tablet by mouth once a day  Prevention & Chronic Care Immunizations   Influenza vaccine: Fluvax 3+  (04/28/2009)    Tetanus booster: Not documented    Pneumococcal vaccine: Pneumovax (Medicare)  (02/27/2008)    H. zoster vaccine: 04/28/2009: Zostavax  Colorectal Screening   Hemoccult: Not documented    Colonoscopy: Done  (03/23/2005)  Other Screening   PSA: 0.59  (04/28/2009)   Smoking status: never  (02/06/2007)  Lipids   Total Cholesterol: 108  (07/25/2009)   LDL: 22  (07/25/2009)   LDL Direct: 132.2  (03/10/2007)   HDL: 66.20  (07/25/2009)   Triglycerides: 98.0  (07/25/2009)    SGOT (AST): 22  (07/25/2009)   SGPT (ALT): 24  (07/25/2009)   Alkaline phosphatase: 52  (07/25/2009)   Total bilirubin: 0.6  (07/25/2009)  Hypertension   Last Blood Pressure: 110 / 70  (01/15/2010)   Serum creatinine: 1.0  (04/16/2009)   Serum potassium 4.1  (04/16/2009)  Self-Management Support :    Hypertension self-management support: Not documented    Lipid self-management support: Not documented

## 2010-06-16 NOTE — Progress Notes (Signed)
Summary: mail order  Phone Note Call from Patient   Caller: Spouse Summary of Call: Wife called stating that [facility] have not received rx from 06/23/09. I advised wife that they were sent but we will fax again Initial call taken by: Rock Nephew CMA,  July 21, 2009 9:22 AM    Prescriptions: AVODART 0.5 MG  CAPS (DUTASTERIDE) once daily  #90 Capsule x 3   Entered by:   Rock Nephew CMA   Authorized by:   Jacques Navy MD   Signed by:   Rock Nephew CMA on 07/21/2009   Method used:   Re-Faxed to ...       [facility])             , [place]         Ph: [phone]       Fax: [phone]   RxID:   [id number] FLOMAX 0.4 MG  CP24 (TAMSULOSIN HCL) 1 once daily  #90 Capsule x 3   Entered by:   Rock Nephew CMA   Authorized by:   Jacques Navy MD   Signed by:   Rock Nephew CMA on 07/21/2009   Method used:   Re-Faxed to ...       [facility])             , [place]         Ph: [phone]       Fax: [phone]   RxID:   [id number]

## 2010-06-16 NOTE — Progress Notes (Signed)
Summary: 48 hour holter  Phone Note Outgoing Call Call back at [facility] Phone [phone]   Call placed by: Stanton Kidney, EMT-P,  February 14, 2010 1:12 PM Summary of Call: Left message to call to schedule for 48 holter monitor. Stanton Kidney, EMT-P  February 14, 2010 1:13 PM  02/16/10 s/w wife, schedule pt. for 02/18/10 at 9:30am. Stanton Kidney, EMT-P  February 16, 2010 5:21 PM

## 2010-06-16 NOTE — Progress Notes (Signed)
Summary: [facility]  Phone Note Call from Patient   Summary of Call: Pt's wife is req a call [facility].  Initial call taken by: Lamar Sprinkles, CMA,  August 07, 2009 5:53 PM  Follow-up for Phone Call        Spoke with patient spouse and they rec'd a letter stating that patient meds could not be shipped b/c [facility] could not determine the origin of the fax. Patient spouse is aware that I re-sent the prescription's noting please expedite shipment. She will call back as needed. Follow-up by: Lucious Groves,  August 08, 2009 8:52 AM    Prescriptions: AVODART 0.5 MG  CAPS (DUTASTERIDE) once daily  #90 Capsule x 3   Entered by:   Lucious Groves   Authorized by:   Jacques Navy MD   Signed by:   Lucious Groves on 08/08/2009   Method used:   Faxed to ...       [facility])             , [place]         Ph: [phone]       Fax: [phone]   RxID:   [id number] FLOMAX 0.4 MG  CP24 (TAMSULOSIN HCL) 1 once daily  #90 Capsule x 3   Entered by:   Lucious Groves   Authorized by:   Jacques Navy MD   Signed by:   Lucious Groves on 08/08/2009   Method used:   Faxed to ...       [facility])             , [place]         Ph: [phone]       Fax: [phone]   RxID:   [id number]

## 2010-06-16 NOTE — Letter (Signed)
Summary: [facility] - Lipid  [facility], Main Office  [address]   [place], [place] [address]   Phone: [phone]  Fax: [phone]     August 04, 2009 MRN: [id number]   [name] [name] [address] [place], [place]  [address]   Dear [name],  We have reviewed your cholesterol results.  They are as follows:     Total Cholesterol:    108 (Desirable: less than 200)       HDL  Cholesterol:     66.20  (Desirable: greater than 40 for men and 50 for women)       LDL Cholesterol:       22  (Desirable: less than 100 for low risk and less than 70 for moderate to high risk)       Triglycerides:       98.0  (Desirable: less than 150)  Our recommendations include:These numbers look good. Continue on the same medicine. Liver function is normal. Take care, Dr. Leanora Cover.    Call our office at the number listed above if you have any questions.  Lowering your LDL cholesterol is important, but it is only one of a large number of "risk factors" that may indicate that you are at risk for heart disease, stroke or other complications of hardening of the arteries.  Other risk factors include:   A.  Cigarette Smoking* B.  High Blood Pressure* C.  Obesity* D.   Low HDL Cholesterol (see yours above)* E.   Diabetes Mellitus (higher risk if your is uncontrolled) F.  Family history of premature heart disease G.  Previous history of stroke or cardiovascular disease    *These are risk factors YOU HAVE CONTROL OVER.  For more information, visit .  There is now evidence that lowering the TOTAL CHOLESTEROL AND LDL CHOLESTEROL can reduce the risk of heart disease.  The American Heart Association recommends the following guidelines for the treatment of elevated cholesterol:  1.  If there is now current heart disease and less than two risk factors, TOTAL CHOLESTEROL should be less than 200 and LDL CHOLESTEROL should be less than 100. 2.  If there is current heart disease or two or  more risk factors, TOTAL CHOLESTEROL should be less than 200 and LDL CHOLESTEROL should be less than 70.  A diet low in cholesterol, saturated fat, and calories is the cornerstone of treatment for elevated cholesterol.  Cessation of smoking and exercise are also important in the management of elevated cholesterol and preventing vascular disease.  Studies have shown that 30 to 60 minutes of physical activity most days can help lower blood pressure, lower cholesterol, and keep your weight at a healthy level.  Drug therapy is used when cholesterol levels do not respond to therapeutic lifestyle changes (smoking cessation, diet, and exercise) and remains unacceptably high.  If medication is started, it is important to have you levels checked periodically to evaluate the need for further treatment options.  Thank you,    [employer] Team

## 2010-06-16 NOTE — Progress Notes (Signed)
   Faxed LOv,Demo,Insurance Card over to [facility] @ [phone] [facility]  February 03, 2010 11:46 AM

## 2010-06-16 NOTE — Procedures (Signed)
Summary: colonoscopy   Colonoscopy  Procedure date:  03/23/2005  Findings:      Location:  [facility].  Pathology:  Adenomatous polyp.        Pathology:  Hyperplastic polyp.     Results: Diverticulosis.         Procedures Next Due Date:    Colonoscopy: 03/2010 Patient Name: [name] [name], [name] [name] MRN: [id number] Procedure Procedures: Colonoscopy CPT: 40981.    with polypectomy. CPT: A3573898.  Personnel: Endoscopist: Wilhemina Bonito. Marina Goodell, MD.  Exam Location: Exam performed in Outpatient Clinic. Outpatient  Patient Consent: Procedure, Alternatives, Risks and Benefits discussed, consent obtained, from patient. Consent was obtained by the RN.  Indications  Surveillance of: Adenomatous Polyp(s). This is an initial surveillance exam. Initial polypectomy was performed in 2003. in May. 1-2 Polyps were found at Index Exam. Largest polyp removed was 1 to 5 mm. Prior polyp located in proximal (splenic flexure and beyond) colon. Pathology of worst  polyp: tubular adenoma.  History  Current Medications: Patient is not currently taking Coumadin.  Pre-Exam Physical: Performed Mar 23, 2005. Cardio-pulmonary exam, Rectal exam, Abdominal exam, Mental status exam WNL.  Exam Exam: Extent of exam reached: Cecum, extent intended: Cecum.  The cecum was identified by appendiceal orifice and IC valve. Patient position: on left side. Colon retroflexion performed. Images taken. ASA Classification: II. Tolerance: excellent.  Monitoring: Pulse and BP monitoring, Oximetry used. Supplemental O2 given.  Colon Prep Used MIRALAX for colon prep. Prep results: excellent.  Sedation Meds: Patient assessed and found to be appropriate for moderate (conscious) sedation. Fentanyl 100 mcg. given IV. Versed 10 mg. given IV.  Findings NORMAL EXAM: Cecum to Rectum.  MULTIPLE POLYPS: Hepatic Flexure to Descending Colon. minimum size 3 mm, maximum size 4 mm. Procedure:  snare without cautery,  removed, Polyp retrieved, 2 polyps Polyps sent to pathology. ICD9: Colon Polyps: 211.3. Comments: 3 seen and removed (hep,splen,descnd).  - DIVERTICULOSIS: Descending Colon to Sigmoid Colon. ICD9: Diverticulosis, Colon: 562.10.   Assessment Abnormal examination, see findings above.  Diagnoses: 211.3: Colon Polyps.  562.10: Diverticulosis, Colon.   Events  Unplanned Interventions: No intervention was required.  Unplanned Events: There were no complications. Plans Disposition: After procedure patient sent to recovery. After recovery patient sent home.  Scheduling/Referral: Colonoscopy, to Wilhemina Bonito. Marina Goodell, MD, in 5 years,    This report was created from the original endoscopy report, which was reviewed and signed by the above listed endoscopist.   cc:  Illene Regulus, MD      The Patient

## 2010-06-16 NOTE — Procedures (Signed)
Summary: Flexible Sigmoidoscopy/[facility]  Flexible Sigmoidoscopy/[facility]   Imported By: Sherian Rein 04/16/2010 10:26:47  _____________________________________________________________________  External Attachment:    Type:   Image     Comment:   External Document

## 2010-06-16 NOTE — Progress Notes (Signed)
Summary: [facility]   Imported By: Sherian Rein 04/16/2010 10:30:34  _____________________________________________________________________  External Attachment:    Type:   Image     Comment:   External Document

## 2010-06-16 NOTE — Progress Notes (Signed)
Summary: calling re rx to be sent    Phone Note Call from Patient   Caller: Patient [phone] Reason for Call: Talk to Nurse Summary of Call: pt calling re rx to be sent, pt forgot name, they said they don't have it can you resend, says he gave the info to you a few weeks ago, can call him if not and he will get it from wife Initial call taken by: Glynda Jaeger,  March 05, 2010 9:40 AM  Follow-up for Phone Call        Left message to call back Deliah Goody, RN  March 05, 2010 5:59 PM   lmtcb Scherrie Bateman, LPN  March 06, 2010 12:18 PM  PT NEEDED SCRIPTS FOR CRESTOR  40 MG AND LISINOPRIL 10 MG  FAXED TO [facility]  NUMBER [phone]. Follow-up by: Scherrie Bateman, LPN,  March 06, 2010 1:42 PM    Prescriptions: LISINOPRIL 10 MG TABS (LISINOPRIL) Take 1 tablet by mouth once a day  #90 x 4   Entered by:   Scherrie Bateman, LPN   Authorized by:   Colon Branch, MD, [facility]   Signed by:   Scherrie Bateman, LPN on 78/29/5621   Method used:   Printed then faxed to ...       [facility]. [phone]* (retail)       [phone] [address].       [place], [place]  [address]       Ph: [phone]       Fax: [phone]   RxID:   [id number] CRESTOR 40 MG TABS (ROSUVASTATIN CALCIUM) Take one tablet by mouth daily.  #90 x 4   Entered by:   Scherrie Bateman, LPN   Authorized by:   Colon Branch, MD, [facility]   Signed by:   Scherrie Bateman, LPN on 33/29/5188   Method used:   Printed then faxed to ...       [facility]. [phone]* (retail)       [phone] [address].       [place], [place]  [address]       Ph: [phone]       Fax: [phone]   RxID:   [id number]

## 2010-06-16 NOTE — Progress Notes (Signed)
   [facility] request recieved sent to [facility]  February 11, 2010 1:32 PM     Appended Document:  2nd Request from [facility]Marland KitchenMarland Kitchen1st copy not found by [facility]. spoke with Okey Regal @ [facility] @ [phone] Case # [id number] she states [facility] never recieved !st copy of request

## 2010-06-16 NOTE — Progress Notes (Signed)
  Phone Note Refill Request   Refills Requested: Medication #1:  FLOMAX 0.4 MG  CP24 1 once daily  Medication #2:  AVODART 0.5 MG  CAPS once daily Initial call taken by: Ami Bullins CMA,  June 23, 2009 4:40 PM    Prescriptions: AVODART 0.5 MG  CAPS (DUTASTERIDE) once daily  #90 Capsule x 3   Entered by:   Ami Bullins CMA   Authorized by:   Jacques Navy MD   Signed by:   Bill Salinas CMA on 06/23/2009   Method used:   Faxed to ...       [facility])             , [place]         Ph: [phone]       Fax: [phone]   RxID:   [id number] FLOMAX 0.4 MG  CP24 (TAMSULOSIN HCL) 1 once daily  #90 Capsule x 3   Entered by:   Ami Bullins CMA   Authorized by:   Jacques Navy MD   Signed by:   Bill Salinas CMA on 06/23/2009   Method used:   Faxed to ...       [facility])             , [place]         Ph: [phone]       Fax: [phone]   RxID:   [id number]   Appended Document:  Patient called to be sure prescriptions were done, notified.

## 2010-06-16 NOTE — Progress Notes (Signed)
Summary[facility]   Imported By: Sherian Rein 04/16/2010 10:32:01  _____________________________________________________________________  External Attachment:    Type:   Image     Comment:   External Document

## 2010-06-16 NOTE — Consult Note (Signed)
Summary: [facility]  [facility]   Imported By: Lennie Odor 02/26/2010 14:35:51  _____________________________________________________________________  External Attachment:    Type:   Image     Comment:   External Document

## 2010-06-16 NOTE — Letter (Signed)
Summary: [facility] Instructions  [facility]  [address] [place], [place] [address]   Phone: [phone]  Fax: [phone]       [name] [name]    1932-11-29    MRN: [id number]        Procedure Day /Date:TUESDAY, 05/19/10     Arrival Time:8:00 AM     Procedure Time:9:00 AM     Location of Procedure:                    X  [facility] (4th Floor)          PREPARATION FOR COLONOSCOPY WITH MOVIPREP   Starting 5 days prior to your procedure12/29/11do not eat nuts, seeds, popcorn, corn, beans, peas,  salads, or any raw vegetables.  Do not take any fiber supplements (e.g. Metamucil, Citrucel, and Benefiber).  THE DAY BEFORE YOUR PROCEDURE         DATE: 05/18/09 DAY: MONDAY  1.  Drink clear liquids the entire day-NO SOLID FOOD  2.  Do not drink anything colored red or purple.  Avoid juices with pulp.  No orange juice.  3.  Drink at least 64 oz. (8 glasses) of fluid/clear liquids during the day to prevent dehydration and help the prep work efficiently.  CLEAR LIQUIDS INCLUDE: Water Jello Ice Popsicles Tea (sugar ok, no milk/cream) Powdered fruit flavored drinks Coffee (sugar ok, no milk/cream) Gatorade Juice: apple, white grape, white cranberry  Lemonade Clear bullion, consomm, broth Carbonated beverages (any kind) Strained chicken noodle soup Hard Candy                             4.  In the morning, mix first dose of MoviPrep solution:    Empty 1 Pouch A and 1 Pouch B into the disposable container    Add lukewarm drinking water to the top line of the container. Mix to dissolve    Refrigerate (mixed solution should be used within 24 hrs)  5.  Begin drinking the prep at 5:00 p.m. The MoviPrep container is divided by 4 marks.   Every 15 minutes drink the solution down to the next mark (approximately 8 oz) until the full liter is complete.   6.  Follow completed prep with 16 oz of clear liquid of your choice (Nothing red or purple).  Continue  to drink clear liquids until bedtime.  7.  Before going to bed, mix second dose of MoviPrep solution:    Empty 1 Pouch A and 1 Pouch B into the disposable container    Add lukewarm drinking water to the top line of the container. Mix to dissolve    Refrigerate  THE DAY OF YOUR PROCEDURE      DATE: 05/19/09 DAY: TUESDAY  Beginning at 4:00 a.m. (5 hours before procedure):         1. Every 15 minutes, drink the solution down to the next mark (approx 8 oz) until the full liter is complete.  2. Follow completed prep with 16 oz. of clear liquid of your choice.    3. You may drink clear liquids until 7:00 AM (2 HOURS BEFORE PROCEDURE).   MEDICATION INSTRUCTIONS  Unless otherwise instructed, you should take regular prescription medications with a small sip of water   as early as possible the morning of your procedure.         OTHER INSTRUCTIONS  You will need a responsible adult at least  75 years of age to accompany you and drive you home.   This person must remain in the waiting room during your procedure.  Wear loose fitting clothing that is easily removed.  Leave jewelry and other valuables at home.  However, you may wish to bring a book to read or  an iPod/MP3 player to listen to music as you wait for your procedure to start.  Remove all body piercing jewelry and leave at home.  Total time from sign-in until discharge is approximately 2-3 hours.  You should go home directly after your procedure and rest.  You can resume normal activities the  day after your procedure.  The day of your procedure you should not:   Drive   Make legal decisions   Operate machinery   Drink alcohol   Return to work  You will receive specific instructions about eating, activities and medications before you leave.    The above instructions have been reviewed and explained to me by   _______________________    I fully understand and can verbalize these instructions  _____________________________ Date _________

## 2010-06-16 NOTE — Letter (Signed)
Summary: Pre Visit Letter Revised  [facility]  [address] [place], [place] [address]   Phone: [phone]  Fax: [phone]        02/19/2010 MRN: [id number] [name] [name] [address] [place], [place]  [address]             Procedure Date:  04-07-10   Welcome to the Gastroenterology Division at [facility].    You are scheduled to see a nurse for your pre-procedure visit on 03-24-10 at 10:30A.M. on the 3rd floor at [facility], [address]  We ask that you try to arrive at our office 15 minutes prior to your appointment time to allow for check-in.  Please take a minute to review the attached form.  If you answer "Yes" to one or more of the questions on the first page, we ask that you call the person listed at your earliest opportunity.  If you answer "No" to all of the questions, please complete the rest of the form and bring it to your appointment.    Your nurse visit will consist of discussing your medical and surgical history, your immediate family medical history, and your medications.   If you are unable to list all of your medications on the form, please bring the medication bottles to your appointment and we will list them.  We will need to be aware of both prescribed and over the counter drugs.  We will need to know exact dosage information as well.    Please be prepared to read and sign documents such as consent forms, a financial agreement, and acknowledgement forms.  If necessary, and with your consent, a friend or relative is welcome to sit-in on the nurse visit with you.  Please bring your insurance card so that we may make a copy of it.  If your insurance requires a referral to see a specialist, please bring your referral form from your primary care physician.  No co-pay is required for this nurse visit.     If you cannot keep your appointment, please call [phone] to cancel or reschedule prior to your appointment date.  This allows  [place] the opportunity to schedule an appointment for another patient in need of care.    Thank you for choosing [facility] for your medical needs.  We appreciate the opportunity to care for you.  Please visit [place] at our website  to learn more about our practice.  Sincerely, The Gastroenterology Division

## 2010-06-16 NOTE — Letter (Signed)
Summary: Colonoscopy Letter  [facility]  [address] [place], [place] [address]   Phone: [phone]  Fax: [phone]      February 17, 2010 MRN: [id number]   EFRAIN CLAUSON [address] [place], [place]  [address]   Dear Mr. MAHABIR,   According to your medical record, it is time for you to schedule a Colonoscopy. The American Cancer Society recommends this procedure as a method to detect early colon cancer. Patients with a family history of colon cancer, or a personal history of colon polyps or inflammatory bowel disease are at increased risk.  This letter has been generated based on the recommendations made at the time of your procedure. If you feel that in your particular situation this may no longer apply, please contact our office.  Please call our office at [phone] to schedule this appointment or to update your records at your earliest convenience.  Thank you for cooperating with [place] to provide you with the very best care possible.   Sincerely,  Wilhemina Bonito. Marina Goodell, M.D.  [facility] Gastroenterology Division [phone]

## 2010-06-16 NOTE — Assessment & Plan Note (Signed)
Summary: [name] [name] call/lg  Medications Added ZOLPIDEM TARTRATE 10 MG TABS (ZOLPIDEM TARTRATE) as needed      Allergies Added: NKDA  Primary Provider:  Norins   History of Present Illness: Hortman is seen today for F/U of elevated lipids and CAD.  He has a stent to the LAD which was widely patient by recent cath as part of the [employer] Trial.  He denies SSCP.  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  He is compliant with his meds and has less myalgias on Crestor.  His BP is under good control and he is trying to limit his sodium intake  Current Problems (verified): 1)  Unspecified Chronic Ischemic Heart Disease  (ICD-414.9) 2)  Hyperlipidemia-mixed  (ICD-272.4) 3)  Cad  (ICD-414.00) 4)  Adjustment Disorder With Depressed Mood  (ICD-309.0) 5)  Angina, Atypical  (ICD-413.9) 6)  Neck Pain  (ICD-723.1) 7)  Benign Prostatic Hypertrophy, Hx of  (ICD-V13.8) 8)  Ibs  (ICD-564.1) 9)  Hypertension  (ICD-401.9)  Current Medications (verified): 1)  Calcium 600 Mg  Tabs (Calcium) .... Once Daily 2)  Garlic-Lecthin 315-176 Mg  Caps (Garlic-Lecthin) .... Once Daily 3)  Flomax 0.4 Mg  Cp24 (Tamsulosin Hcl) .Marland Kitchen.. 1 Once Daily 4)  Aspirin 81 Mg  Tabs (Aspirin) .... Take 1 Tablet By Mouth Once A Day 5)  Avodart 0.5 Mg  Caps (Dutasteride) .... Once Daily 6)  Lisinopril 10 Mg Tabs (Lisinopril) .... Take 1 Tablet By Mouth Once A Day 7)  Zolpidem Tartrate 10 Mg Tabs (Zolpidem Tartrate) .... As Needed 8)  Crestor 40 Mg Tabs (Rosuvastatin Calcium) .... Take One Tablet By Mouth Daily.  Allergies (verified): No Known Drug Allergies  Past History:  Past Medical History: Last updated: 06/17/2008 UCD BENIGN PROSTATIC HYPERTROPHY, HX OF (ICD-V13.8) IBS (ICD-564.1) HYPERTENSION (ICD-401.9) anxiety rhinnitis Physician roster;                            GI- Dr. Landry Corporal - Dr. Nicholas Lose    Psych- Dr Babs Sciara CAD: drug eluting stent LAD 03/2007 [place] Hyperlipidemia  Past Surgical History: Last updated: 06/17/2008 Appendectomy 1956 sinus surgery 1975  Family History: Last updated: 03/16/2007 neg for lung, colon cancer; neg for CAD, DM, HTN  Social History: Last updated: 04/28/2009 [employer] grad. married '62  1 son- Chief Technology Officer x 12 years, now works with dad; 1 daughter-PA, 1 daughter - Hydrologist; 1 grand-daughter (Nov 15, '09) work: Copywriter, advertising and self-employed. Doing well. Reports that he spends a good deal of time at his home in [place] which he greatly enjoys.  Review of Systems       Denies fever, malais, weight loss, blurry vision, decreased visual acuity, cough, sputum, SOB, hemoptysis, pleuritic pain, palpitaitons, heartburn, abdominal pain, melena, lower extremity edema, claudication, or rash.   Vital Signs:  Patient profile:   77 year old male Height:      69 inches Weight:      152 pounds BMI:     22.53 Pulse rate:   74 / minute Resp:     12 per minute BP sitting:   109 / 65  (left arm)  Vitals Entered By: Kem Parkinson (August 07, 2009 4:15 PM)  Physical Exam  General:  Affect appropriate Healthy:  appears stated age HEENT: normal Neck supple with no adenopathy JVP normal no bruits no thyromegaly Lungs clear with no wheezing and good diaphragmatic motion Heart:  S1/S2 no murmur,rub, gallop or click PMI normal Abdomen: benighn, BS positve, no tenderness, no AAA no bruit.  No HSM or HJR Distal pulses intact with no bruits No edema Neuro non-focal Skin warm and dry    Impression & Recommendations:  Problem # 1:  CAD (ICD-414.00) Prev stent LAD patent by cath.  No angina.  Continue medical Rx His updated medication list for this problem includes:    Aspirin 81 Mg Tabs (Aspirin) .Marland Kitchen... Take 1 tablet by mouth once a day    Lisinopril 10 Mg Tabs (Lisinopril) .Marland Kitchen... Take 1 tablet by mouth once a day  Problem # 2:   HYPERTENSION (ICD-401.9) Well contorlled His updated medication list for this problem includes:    Aspirin 81 Mg Tabs (Aspirin) .Marland Kitchen... Take 1 tablet by mouth once a day    Lisinopril 10 Mg Tabs (Lisinopril) .Marland Kitchen... Take 1 tablet by mouth once a day  Problem # 3:  HYPERLIPIDEMIA-MIXED (ICD-272.4) At goal with no side effects His updated medication list for this problem includes:    Crestor 40 Mg Tabs (Rosuvastatin calcium) .Marland Kitchen... Take one tablet by mouth daily.  CHOL: 108 (07/25/2009)   LDL: 22 (07/25/2009)   HDL: 66.20 (07/25/2009)   TG: 98.0 (07/25/2009)  Patient Instructions: 1)  Your physician wants you to follow-up in: 6 months with Dr Eden Emms.  You will receive a reminder letter in the mail two months in advance. If you don't receive a letter, please call our office to schedule the follow-up appointment.

## 2010-06-16 NOTE — Progress Notes (Signed)
   Monitor,12 lead faxed to [facility] @ [phone] [facility]  March 09, 2010 12:51 PM

## 2010-06-16 NOTE — Letter (Signed)
Summary: New Patient letter  [facility]  [address] [place], [place] [address]   Phone: [phone]  Fax: [phone]       02/27/2010 MRN: [id number]  [name] [name] [address] [place], [place]  [address]  Dear [name] [name],  Welcome to the Gastroenterology Division at [facility].    You are scheduled to see Dr.  Marina Goodell on 04-15-10 at 10:00a.m. on the 3rd floor at [facility], [address].  We ask that you try to arrive at our office 15 minutes prior to your appointment time to allow for check-in.  We would like you to complete the enclosed self-administered evaluation form prior to your visit and bring it with you on the day of your appointment.  We will review it with you.  Also, please bring a complete list of all your medications or, if you prefer, bring the medication bottles and we will list them.  Please bring your insurance card so that we may make a copy of it.  If your insurance requires a referral to see a specialist, please bring your referral form from your primary care physician.  Co-payments are due at the time of your visit and may be paid by cash, check or credit card.     Your office visit will consist of a consult with your physician (includes a physical exam), any laboratory testing he/she may order, scheduling of any necessary diagnostic testing (e.g. x-ray, ultrasound, CT-scan), and scheduling of a procedure (e.g. Endoscopy, Colonoscopy) if required.  Please allow enough time on your schedule to allow for any/all of these possibilities.    If you cannot keep your appointment, please call [phone] to cancel or reschedule prior to your appointment date.  This allows [place] the opportunity to schedule an appointment for another patient in need of care.  If you do not cancel or reschedule by 5 p.m. the business day prior to your appointment date, you will be charged a $50.00 late cancellation/no-show fee.    Thank you for choosing  [facility] for your medical needs.  We appreciate the opportunity to care for you.  Please visit [place] at our website  to learn more about our practice.                     Sincerely,                                                             The Gastroenterology Division

## 2010-06-16 NOTE — Procedures (Signed)
Summary: SUMMARY REPORT  SUMMARY REPORT   Imported By: Mirna Mires 03/03/2010 15:00:13  _____________________________________________________________________  External Attachment:    Type:   Image     Comment:   External Document

## 2010-06-16 NOTE — Consult Note (Signed)
Summary[facility]   Imported By: Sherian Rein 04/16/2010 10:29:26  _____________________________________________________________________  External Attachment:    Type:   Image     Comment:   External Document

## 2010-06-16 NOTE — Progress Notes (Signed)
Summary: Alprazolam refill  Phone Note Call from Patient Call back at Work Phone [phone]   Summary of Call: Patient left message on triage that he will be flying out of town and needs Alprazolam refilled. Per the patient this was last filled in Feb and he cannot board the plane without having had the medication. Please advise. Initial call taken by: Lucious Groves,  September 11, 2009 9:26 AM  Follow-up for Phone Call        ok to refill his alprazolam Follow-up by: Jacques Navy MD,  September 11, 2009 1:18 PM    New/Updated Medications: ALPRAZOLAM 0.25 MG TABS (ALPRAZOLAM) 1/2 to 1 as needed Prescriptions: ALPRAZOLAM 0.25 MG TABS (ALPRAZOLAM) 1/2 to 1 as needed  #30 x 1   Entered by:   Ami Bullins CMA   Authorized by:   Jacques Navy MD   Signed by:   Bill Salinas CMA on 09/12/2009   Method used:   Telephoned to ...       [facility]. [phone]* (retail)       [phone] [address].       [place], [place]  [address]       Ph: [phone]       Fax: [phone]   RxID:   [id number]

## 2010-06-16 NOTE — Assessment & Plan Note (Signed)
Summary: Surveillance colonoscopy (on Plavix?)    History of Present Illness Visit Type: new patient  Primary GI MD: Yancey Flemings MD Primary Provider: Jacques Navy, MD  Requesting Provider: na Chief Complaint: Consult colon. Pt states at times will have diarrhea, constipation, and hemorrhoids but not bothering him at this time. Pt states that he was taken off of Plavix by Cardiologist  History of Present Illness:   77 year old with hypertension, hyperlipidemia, coronary artery disease, anxiety, and adenomatous colon polyps. He presents today after receiving a recall letter for surveillance colonoscopy. At that time, he was on Plavix therapy. Patient underwent previous colonoscopy in 2003 and again in 2006 with both adenomatous and hyperplastic polyps found. He recently had some visual disturbance which was felt to possibly represent a TIA. Initially placed on aspirin and sent to neurology. A neurologist told him that he probably had a migraine equivalent but recommended Plavix empirically, off chance that may represent TIA. The patient reported easy bruisability and has discontinued Plavix on his own. Thus, on no anticoagulation therapy. He does remain on aspirin. Chronic medical problems are stable as are his medications. He continues with chronic bowel habits and alternate between mild constipation and diarrhea. He has had this for years. Also occasional discomfort from hemorrhoids.   GI Review of Systems      Denies abdominal pain, acid reflux, belching, bloating, chest pain, dysphagia with liquids, dysphagia with solids, heartburn, loss of appetite, nausea, vomiting, vomiting blood, weight loss, and  weight gain.      Reports constipation, diarrhea, and  hemorrhoids.     Denies anal fissure, black tarry stools, change in bowel habit, diverticulosis, fecal incontinence, heme positive stool, irritable bowel syndrome, jaundice, light color stool, liver problems, rectal bleeding, and  rectal  pain.    Current Medications (verified): 1)  Calcium 600 Mg  Tabs (Calcium) .... Once Daily 2)  Garlic-Lecthin 161-096 Mg  Caps (Garlic-Lecthin) .... Once Daily 3)  Flomax 0.4 Mg  Cp24 (Tamsulosin Hcl) .Marland Kitchen.. 1 Once Daily 4)  Aspirin Ec 325 Mg Tbec (Aspirin) .... Take One Tablet By Mouth Daily 5)  Avodart 0.5 Mg  Caps (Dutasteride) .... Once Daily 6)  Lisinopril 10 Mg Tabs (Lisinopril) .... Take 1 Tablet By Mouth Once A Day 7)  Zolpidem Tartrate 10 Mg Tabs (Zolpidem Tartrate) .... As Needed 8)  Crestor 40 Mg Tabs (Rosuvastatin Calcium) .... Take One Tablet By Mouth Daily. 9)  Alprazolam 0.25 Mg Tabs (Alprazolam) .... 1/2 To 1 As Needed  Allergies (verified): No Known Drug Allergies  Past History:  Past Medical History: UCD BENIGN PROSTATIC HYPERTROPHY, HX OF (ICD-V13.8) IBS (ICD-564.1) HYPERTENSION (ICD-401.9) Anxiety Rhinnitis Hyperlipidemia  TIA CAD 211.3: Colon Polyps 562.10: Diverticulosis, Colon 455.6: Hemorrhoids, Internal and  External  Physician roster:                            GI- Dr. Landry Corporal - Dr. Nicholas Lose                             Psych- Dr Babs Sciara CAD: drug eluting stent LAD 03/2007 Juanda Chance  Past Surgical History: Appendectomy 1956 sinus surgery 1975 Stent Surgery  Hernia Surgery   Family History: neg for lung, colon cancer; neg for CAD,  DM, HTN Family History of Heart Disease: Father--MI   Social History: [employer] grad married '62  1 son- Chief Technology Officer x 12 years, now works with dad; 1 daughter-PA, 1 daughter - Hydrologist; 1 grand-daughter (Nov 15, '09) work: Copywriter, advertising and self-employed. Doing well. Reports that he spends a good deal of time at his home in [facility] which he greatly enjoys. Patient has never smoked Alcohol Use - yes: 1-2  Illicit Drug Use - no  Review of Systems       The patient complains of allergy/sinus.  The patient denies anemia, anxiety-new, arthritis/joint pain, back pain, blood in  urine, breast changes/lumps, change in vision, confusion, cough, coughing up blood, depression-new, fainting, fatigue, fever, headaches-new, hearing problems, heart murmur, heart rhythm changes, itching, menstrual pain, muscle pains/cramps, night sweats, nosebleeds, pregnancy symptoms, shortness of breath, skin rash, sleeping problems, sore throat, swelling of feet/legs, swollen lymph glands, thirst - excessive , urination - excessive , urination changes/pain, urine leakage, vision changes, and voice change.    Vital Signs:  Patient profile:   77 year old male Height:      69 inches Weight:      155 pounds BMI:     22.97 BSA:     1.86 Pulse rate:   68 / minute Pulse rhythm:   regular BP sitting:   128 / 64  (left arm) Cuff size:   regular  Vitals Entered By: Ok Anis CMA (April 15, 2010 10:23 AM)  Physical Exam  General:  Well developed, well nourished, no acute distress. Head:  Normocephalic and atraumatic. Eyes:  PERRLA, no icterus. Mouth:  No deformity or lesions. Neck:  Supple; no masses or thyromegaly. Lungs:  Clear throughout to auscultation. Heart:  Regular rate and rhythm; no murmurs, rubs,  or bruits. Abdomen:  Soft, nontender and nondistended. No masses, hepatosplenomegaly or hernias noted. Normal bowel sounds. Msk:  Symmetrical with no gross deformities. Normal posture. Pulses:  Normal pulses noted. Extremities:  No clubbing, cyanosis, edema or deformities noted. Neurologic:  Alert and  oriented x4;  grossly normal neurologically. Skin:  Intact without significant lesions or rashes. Psych:  Alert and cooperative. Normal mood and affect.   Impression & Recommendations:  Problem # 1:  COLONIC POLYPS, HX OF (ICD-V12.72) history of adenomatous colon polyps. Due for surveillance. He is an appropriate candidate without contraindication. The nature of colonoscopy as well as the risks, benefits, and alternatives were reviewed. He understood and agreed to proceed. Movi  prep prescribed. The patient instructed on its use.  Problem # 2:  CAD (ICD-414.00) history of coronary artery disease. Stable. On aspirin but no Plavix.  Other Orders: Colonoscopy (Colon)  Patient Instructions: 1)  Colonoscopy [facility] 05/19/10 9:00 am arrive at 8:00 am 2)  Movi prep instructions given. 3)  Movi prep Rx. sent to pharmacy. 4)  Colonoscopy and Flexible Sigmoidoscopy brochure given.  5)  Copy sent to : Jacques Navy, MD  6)  The medication list was reviewed and reconciled.  All changed / newly prescribed medications were explained.  A complete medication list was provided to the patient / caregiver. Prescriptions: MOVIPREP 100 GM  SOLR (PEG-KCL-NACL-NASULF-NA ASC-C) As per prep instructions.  #1 x 0   Entered by:   Milford Cage NCMA   Authorized by:   Hilarie Fredrickson MD   Signed by:   Milford Cage NCMA on 04/15/2010   Method used:   Electronically to        [facility] [phone] (retail)       [phone]       [place], [place]  [address]       Ph: [phone]       Fax: [phone]   RxID:   [id number]

## 2010-06-18 NOTE — Assessment & Plan Note (Signed)
Summary: YEARLY   --STC   Vital Signs:  Patient profile:   77 year old male Height:      69 inches Weight:      155 pounds BMI:     22.97 O2 Sat:      96 % on Room air Temp:     97.8 degrees F oral Pulse rate:   72 / minute BP sitting:   110 / 58  (left arm) Cuff size:   regular  Vitals Entered By: Bill Salinas CMA (May 07, 2010 10:09 AM)  O2 Flow:  Room air CC: cpx/ ab  Vision Screening:      Vision Comments: 10/2009 with no changes/ ab   Primary Care Ethie Curless:  Jacques Navy, MD   CC:  cpx/ ab.  History of Present Illness: Patient presents for medicare wellness exam and general medical care.   He has been doing well except for an episode of visual change of brief duration. He was thoroughly evaluated  by both Cardiology ( Dr. Daleen Squibb) and neurology (Dr. Kinnie Scales) with negative MRI brain, MRA brain, 2 D echo, event record and labs. 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   He is otherwise doing well. He has had routine follow up for his hearing with Dr. Annalee Genta. He is scheduled for routine colorectal cancer screening with  Dr. Marina Goodell.  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  He is 100%  independent in all ADLs. He has had no falls and has no increased fall risk. he has no signs or symptoms of depression. He is cognitively intact managing his business affairs and personal affairs.   Preventive Screening-Counseling & Management  Alcohol-Tobacco     Alcohol drinks/day: 1     Alcohol type: wine     >5/day in last 3 mos: no     Smoking Status: never  Caffeine-Diet-Exercise     Caffeine use/day: 6 cups per day     Diet Comments: heart healthy     Diet Counseling: not indicated; diet is assessed to be healthy     Does Patient Exercise: yes     Type of exercise: walking     Exercise (avg: min/session): 30-60     Times/week: 6     Exercise Counseling: not indicated; exercise is  adequate  Hep-HIV-STD-Contraception     Hepatitis Risk: no risk noted     HIV Risk: no risk noted     STD Risk: no risk noted     Dental Visit-last 6 months yes     TSE monthly: no     Sun Exposure-Excessive: no  Safety-Violence-Falls     Seat Belt Use: yes     Helmet Use: n/a     Firearms in the Home: firearms in the home-gun safe     Smoke Detectors: yes     Violence in the Home: no risk noted     Sexual Abuse: no     Fall Risk: low fall risk      Sexual History:  currently monogamous.        Drug Use:  never.        Blood Transfusions:  no.    Current Problems (verified): 1)  Special Screening Malignant Neoplasm of Prostate  (ICD-V76.44) 2)  Colonic Polyps, Hx of  (ICD-V12.72) 3)  Unspecified Chronic Ischemic Heart Disease  (ICD-414.9) 4)  Hyperlipidemia-mixed  (ICD-272.4) 5)  Cad  (ICD-414.00) 6)  Adjustment Disorder With Depressed Mood  (  ICD-309.0) 7)  Angina, Atypical  (ICD-413.9) 8)  Neck Pain  (ICD-723.1) 9)  Benign Prostatic Hypertrophy, Hx of  (ICD-V13.8) 10)  Ibs  (ICD-564.1) 11)  Hypertension  (ICD-401.9)  Current Medications (verified): 1)  Calcium 600 Mg  Tabs (Calcium) .... Once Daily 2)  Garlic-Lecthin 161-096 Mg  Caps (Garlic-Lecthin) .... Once Daily 3)  Flomax 0.4 Mg  Cp24 (Tamsulosin Hcl) .Marland Kitchen.. 1 Once Daily 4)  Aspirin Ec 325 Mg Tbec (Aspirin) .... Take One Tablet By Mouth Daily 5)  Avodart 0.5 Mg  Caps (Dutasteride) .... Once Daily 6)  Lisinopril 10 Mg Tabs (Lisinopril) .... Take 1 Tablet By Mouth Once A Day 7)  Zolpidem Tartrate 10 Mg Tabs (Zolpidem Tartrate) .... As Needed 8)  Crestor 40 Mg Tabs (Rosuvastatin Calcium) .... Take One Tablet By Mouth Daily. 9)  Alprazolam 0.25 Mg Tabs (Alprazolam) .... 1/2 To 1 As Needed 10)  Moviprep 100 Gm  Solr (Peg-Kcl-Nacl-Nasulf-Na Asc-C) .... As Per Prep Instructions.  Allergies (verified): No Known Drug Allergies  Past History:  Past Medical History: UCD BENIGN PROSTATIC HYPERTROPHY, HX OF  (ICD-V13.8) IBS (ICD-564.1) HYPERTENSION (ICD-401.9) Anxiety Rhinnitis Hyperlipidemia  CAD 211.3: Colon Polyps 562.10: Diverticulosis, Colon 455.6: Hemorrhoids, Internal and  External  Physician roster:                            GI- Dr. Landry Corporal - Dr. Nicholas Lose                             Psych- Dr Babs Sciara                             Neurology - Dr. Marjory Lies                             cardiologist - Dr. Eden Emms  Past Surgical History: Appendectomy 1956 sinus surgery 1975 Stent Surgery  Hernia Surgery - righ herniorrhy  Family History: Father  deceased @ 83: CAd/MI Mother - deceased @ 39: cause of death unknown. Had RA neg for lung, colon cancer, DM, HTN  Social History: Caffeine use/day:  6 cups per day Dental Care w/in 6 mos.:  yes Sun Exposure-Excessive:  no Seat Belt Use:  yes Fall Risk:  low fall risk Blood Transfusions:  no Hepatitis Risk:  no risk noted HIV Risk:  no risk noted STD Risk:  no risk noted Sexual History:  currently monogamous Drug Use:  never  Review of Systems  The patient denies anorexia, fever, weight loss, weight gain, vision loss, decreased hearing, hoarseness, chest pain, syncope, dyspnea on exertion, peripheral edema, prolonged cough, headaches, hemoptysis, abdominal pain, severe indigestion/heartburn, hematuria, genital sores, muscle weakness, transient blindness, difficulty walking, depression, abnormal bleeding, enlarged lymph nodes, angioedema, and testicular masses.    Physical Exam  General:  alert, well-developed, well-nourished, well-hydrated, and healthy-appearing white male in no distress.   Head:  normocephalic, atraumatic, and no abnormalities observed.   Eyes:  vision grossly intact, pupils equal, pupils round, pupils reactive to light, corneas and lenses clear, and no injection.  Fundicsopic exam deferred to opthalmology Ears:  R ear normal, L ear normal, and no external deformities.  Nose:  no  external deformity and no external erythema.   Mouth:  good dentition, no gingival abnormalities, no dental plaque, pharynx pink and moist, no erythema, no posterior lymphoid hypertrophy, and no pharyngeal crowing.   Neck:  supple, full ROM, no masses, no thyromegaly, and no carotid bruits.   Chest Wall:  no deformities.   Lungs:  Normal respiratory effort, chest expands symmetrically. Lungs are clear to auscultation, no crackles or wheezes. Heart:  Normal rate and regular rhythm. S1 and S2 normal without gallop, murmur, click, rub or other extra sounds. Abdomen:  soft, non-tender, normal bowel sounds, no guarding, and no hepatomegaly.   Prostate:  deferred to PSA Msk:  normal ROM, no joint tenderness, no joint swelling, no joint warmth, no redness over joints, and no joint instability.   Pulses:  2+ radial pulses Extremities:  No clubbing, cyanosis, edema, or deformity noted with normal full range of motion of all joints.   Neurologic:  alert & oriented X3, cranial nerves II-XII intact, strength normal in all extremities, gait normal, and DTRs symmetrical and normal.   Skin:  turgor normal, color normal, no rashes, no suspicious lesions, and no ulcerations.   Cervical Nodes:  no anterior cervical adenopathy and no posterior cervical adenopathy.   Axillary Nodes:  no R axillary adenopathy and no L axillary adenopathy.   Psych:  Oriented X3, memory intact for recent and remote, normally interactive, good eye contact, and not anxious appearing.     Impression & Recommendations:  Problem # 1:  CAD (ICD-414.00) Followed closely by Dr. Eden Emms. He is doing very well: no chest pain or limitations in his activities. He is adherent to his medical regimen.  Plan - continued risk factor modification           follow-up with Dr. Eden Emms as instructed.  His updated medication list for this problem includes:    Aspirin Ec 325 Mg Tbec (Aspirin) .Marland Kitchen... Take one tablet by mouth daily    Lisinopril 10 Mg  Tabs (Lisinopril) .Marland Kitchen... Take 1 tablet by mouth once a day  Problem # 2:  HYPERLIPIDEMIA-MIXED (ICD-272.4) Due for routine lab follow-up with recommendations to follow.   His updated medication list for this problem includes:    Crestor 40 Mg Tabs (Rosuvastatin calcium) .Marland Kitchen... Take one tablet by mouth daily.  Orders: TLB-Lipid Panel (80061-LIPID) TLB-Hepatic/Liver Function Pnl (80076-HEPATIC) TLB-TSH (Thyroid Stimulating Hormone) (84443-TSH)  Addendum - lab with excellent control. LDL less than 70.   Problem # 3:  IBS (ICD-564.1) Stable with no flare of symptoms or bowel problems.  Problem # 4:  HYPERTENSION (ICD-401.9)  His updated medication list for this problem includes:    Lisinopril 10 Mg Tabs (Lisinopril) .Marland Kitchen... Take 1 tablet by mouth once a day  Orders: TLB-BMP (Basic Metabolic Panel-BMET) (80048-METABOL)  BP today: 110/58 Prior BP: 128/64 (04/15/2010)  Labs Reviewed: K+: 4.2 (02/03/2010) Creat: : 1.0 (02/03/2010)   Excellent control. Plan is to continue present medications.   Problem # 5:  Preventive Health Care (ICD-V70.0)  Interval history as noted. He is currently doing very well. Physical exam is unremarkable. Lab results are all within normal range. He is current with colorectal cancer screening with last study Nov '06. Prostate cancer screen - PSA 0.55 normal. Immunizations: Pneumovax Oct '09; shingles vaccine Dec '10.  Neuro workup was negative and the working diagnosis is atypical migraine to explain his transient visual changes which have not recurred.   In summary - a very nice man who is  medically stable. His prevention is up to date. He is counseled to continue healthy diet and to maintain a regular exercise program. He is counseled to investigate long term care options.  He will return as needed or in 1 year for routine follow-up.  Orders: Medicare -1st Annual Wellness Visit [phone])  Complete Medication List: 1)  Calcium 600 Mg Tabs (Calcium) ....  Once daily 2)  Garlic-lecthin 956-213 Mg Caps (Garlic-lecthin) .... Once daily 3)  Flomax 0.4 Mg Cp24 (Tamsulosin hcl) .Marland Kitchen.. 1 once daily 4)  Aspirin Ec 325 Mg Tbec (Aspirin) .... Take one tablet by mouth daily 5)  Avodart 0.5 Mg Caps (Dutasteride) .... Once daily 6)  Lisinopril 10 Mg Tabs (Lisinopril) .... Take 1 tablet by mouth once a day 7)  Zolpidem Tartrate 10 Mg Tabs (Zolpidem tartrate) .... As needed 8)  Crestor 40 Mg Tabs (Rosuvastatin calcium) .... Take one tablet by mouth daily. 9)  Alprazolam 0.25 Mg Tabs (Alprazolam) .... 1/2 to 1 as needed 10)  Moviprep 100 Gm Solr (Peg-kcl-nacl-nasulf-na asc-c) .... As per prep instructions.  Other Orders: TLB-CBC Platelet - w/Differential (85025-CBCD) TLB-PSA (Prostate Specific Antigen) (84153-PSA)  Patient: [name] [name] Note: All result statuses are Final unless otherwise noted.  Tests: (1) Lipid Panel (LIPID)   Cholesterol               125 mg/dL                   0-865     ATP III Classification            Desirable:  < 200 mg/dL                    Borderline High:  200 - 239 mg/dL               High:  > = 240 mg/dL   Triglycerides             125.0 mg/dL                 7.8-469.6     Normal:  <150 mg/dL     Borderline High:  295 - 199 mg/dL   HDL                       28.41 mg/dL                 >32.44   VLDL Cholesterol          25.0 mg/dL                  0.1-02.7   LDL Cholesterol           43 mg/dL                    2-53  CHO/HDL Ratio:  CHD Risk                             2                    Men          Women     1/2 Average Risk     3.4          3.3     Average Risk          5.0  4.4     2X Average Risk          9.6          7.1     3X Average Risk          15.0          11.0                           Tests: (2) Hepatic/Liver Function Panel (HEPATIC)   Total Bilirubin           0.7 mg/dL                   1.6-1.0   Direct Bilirubin          0.1 mg/dL                   9.6-0.4   Alkaline Phosphatase      57  U/L                      39-117   AST                       22 U/L                      0-37   ALT                       21 U/L                      0-53   Total Protein             6.4 g/dL                    5.4-0.9   Albumin                   4.0 g/dL                    8.1-1.9  Tests: (3) BMP (METABOL)   Sodium                    139 mEq/L                   135-145   Potassium                 4.5 mEq/L                   3.5-5.1   Chloride                  106 mEq/L                   96-112   Carbon Dioxide            27 mEq/L                    19-32   Glucose                   86 mg/dL                    14-78   BUN  14 mg/dL                    1-61   Creatinine                1.0 mg/dL                   0.9-6.0   Calcium                   9.6 mg/dL                   4.5-40.9   GFR                       79.25 mL/min                >60.00  Tests: (4) CBC Platelet w/Diff (CBCD)   White Cell Count          5.5 K/uL                    4.5-10.5   Red Cell Count            4.43 Mil/uL                 4.22-5.81   Hemoglobin                14.0 g/dL                   81.1-91.4   Hematocrit                40.4 %                      39.0-52.0   MCV                       91.2 fl                     78.0-100.0   MCHC                      34.6 g/dL                   78.2-95.6   RDW                       13.1 %                      11.5-14.6   Platelet Count            171.0 K/uL                  150.0-400.0   Neutrophil %              62.6 %                      43.0-77.0   Lymphocyte %              22.7 %                      12.0-46.0   Monocyte %                10.6 %  3.0-12.0   Eosinophils%              3.6 %                       0.0-5.0   Basophils %               0.5 %                       0.0-3.0   Neutrophill Absolute      3.4 K/uL                    1.4-7.7   Lymphocyte Absolute       1.3 K/uL                    0.7-4.0   Monocyte  Absolute         0.6 K/uL                    0.1-1.0  Eosinophils, Absolute                             0.2 K/uL                    0.0-0.7   Basophils Absolute        0.0 K/uL                    0.0-0.1  Tests: (5) TSH (TSH)   FastTSH                   2.02 uIU/mL                 0.35-5.50  Tests: (6) Prostate Specific Antigen (PSA)   PSA-Hyb                   0.55 ng/mL                  0.10-4.00   Orders Added: 1)  TLB-Lipid Panel [80061-LIPID] 2)  TLB-Hepatic/Liver Function Pnl [80076-HEPATIC] 3)  TLB-BMP (Basic Metabolic Panel-BMET) [80048-METABOL] 4)  TLB-CBC Platelet - w/Differential [85025-CBCD] 5)  TLB-TSH (Thyroid Stimulating Hormone) [84443-TSH] 6)  TLB-PSA (Prostate Specific Antigen) [16109-UEA] 7)  Medicare -1st Annual Wellness Visit [G0438] 8)  Est. Patient Level III [54098]

## 2010-06-18 NOTE — Letter (Signed)
Summary: [facility]  [facility]   Imported By: Lester [place] 05/20/2010 11:10:46  _____________________________________________________________________  External Attachment:    Type:   Image     Comment:   External Document

## 2010-06-18 NOTE — Progress Notes (Signed)
 ----   Converted from flag ---- ---- 05/19/2010 8:06 AM, Tawni Levy wrote: patient wants to cancel procedure and wants to give you the reason on why he is cancelling ------------------------------  Phone Note Other Incoming   Summary of Call: see phone note. Initial call taken by: Milford Cage NCMA,  May 19, 2010 8:15 AM

## 2010-06-18 NOTE — Procedures (Addendum)
Summary: Colonoscopy  Patient: [name] [name] Note: All result statuses are Final unless otherwise noted.  Tests: (1) Colonoscopy (COL)   COL Colonoscopy           DONE (C)     [facility]     [address]     [place], [place]  [address]           COLONOSCOPY PROCEDURE REPORT           PATIENT:  [name] [name], [name] [name]  MR#:  [id number]     BIRTHDATE:  08-17-1932, 77 yrs. old  GENDER:  male     ENDOSCOPIST:  Wilhemina Bonito. Eda Keys, MD     REF. BY:  Surveillance Program Recall     PROCEDURE DATE:  05/27/2010     PROCEDURE:  Colonoscopy with snare polypectomy x 1     ASA CLASS:  Class II     INDICATIONS:  history of pre-cancerous (adenomatous) colon polyps,     surveillance and high-risk screening ; index 2003 and f/u 2006 w/     TAs     MEDICATIONS:   Fentanyl 87.5 mcg IV, Versed 10 mg IV           DESCRIPTION OF PROCEDURE:   After the risks benefits and     alternatives of the procedure were thoroughly explained, informed     consent was obtained.  Digital rectal exam was performed and     revealed moderate external hemorrhoids.   The LB CF-[id number]     endoscope was introduced through the anus and advanced to the     cecum, which was identified by both the appendix and ileocecal     valve, without limitations.Time to ceum = 4:40 min.  The quality     of the prep was excellent, using MoviPrep.  The instrument was     then slowly withdrawn (time = 18:06 min) as the colon was fully     examined.     <<PROCEDUREIMAGES>>           FINDINGS:  A 12mm sessile polyp was found in the mid transverse     colon. Polyps were snared without cautery. Retrieval was     successful.   Mild diverticulosis was found in the sigmoid colon.     Retroflexed views in the rectum revealed internal hemorrhoids.     The scope was then withdrawn from the patient and the procedure     completed.           COMPLICATIONS:  None     ENDOSCOPIC IMPRESSION:     1) Sessile polyp in the mid transverse colon -  removed     2) Mild diverticulosis in the sigmoid colon           RECOMMENDATIONS:     1) Repeat Colonoscopy in 3 years.           ______________________________     Wilhemina Bonito. Eda Keys, MD           CC:  Dr. Illene Regulus, The patient           n.     REVISED:  05/28/2010 10:52 AM     eSIGNED:   Wilhemina Bonito. Eda Keys at 05/28/2010 10:52 AM           Latanya Maudlin, [id number]  Note: An exclamation mark (!) indicates a result that was not dispersed into the flowsheet. Document Creation Date: 05/28/2010 10:52 AM _______________________________________________________________________  Marland Kitchen  1) Order result status: Final Collection or observation date-time: 05/27/2010 08:54 Requested date-time:  Receipt date-time:  Reported date-time:  Referring Physician:   Ordering Physician: Fransico Setters [phone]) Specimen Source:  Source: Launa Grill Order Number: [id number] Lab site:   Appended Document: Colonoscopy recall 3 yrs/aw     Procedures Next Due Date:    Colonoscopy: 05/2013

## 2010-06-18 NOTE — Progress Notes (Signed)
Summary: cxl procedure for 05/19/10   Phone Note Outgoing Call   Call placed by: Milford Cage NCMA,  May 19, 2010 8:08 AM Call placed to: Patient Summary of Call: Returned pt. call and he states that he is not going to be able to make it this morning for his 9:00 am procedure.  He wants to reschedule.  I rescheduled him to 05/27/10.  Pt. did not give me details on why he canceled and is fully aware of the 100.00 cxl fee.   Initial call taken by: Milford Cage NCMA,  May 19, 2010 8:13 AM     Appended Document: cxl procedure for 05/19/10 charge fee  Appended Document: cxl procedure for 05/19/10 done.

## 2010-06-18 NOTE — Letter (Signed)
Summary: Patient Notice- Polyp Results  [facility]  [address] [place], [place] [address]   Phone: [phone]  Fax: [phone]        June 02, 2010 MRN: [id number]    [name] [name] [address] [place], [place]  [address]    Dear [name] [name],  I am pleased to inform you that the colon polyp(s) removed during your recent colonoscopy was (were) found to be benign (no cancer detected) upon pathologic examination.  I recommend you have a repeat colonoscopy examination in 3 years to look for recurrent polyps, as having colon polyps increases your risk for having recurrent polyps or even colon cancer in the future.  Should you develop new or worsening symptoms of abdominal pain, bowel habit changes or bleeding from the rectum or bowels, please schedule an evaluation with either your primary care physician or with me.  Additional information/recommendations:  __ No further action with gastroenterology is needed at this time. Please      follow-up with your primary care physician for your other healthcare      needs.    Please call us if you are having persistent problems or have questions about your condition that have not been fully answered at this time.  Sincerely,  Hilarie Fredrickson MD  This letter has been electronically signed by your physician.  Appended Document: Patient Notice- Polyp Results Letter mailed

## 2010-07-20 ENCOUNTER — Encounter: Payer: Self-pay | Admitting: Cardiovascular Disease

## 2010-08-03 ENCOUNTER — Encounter: Payer: Self-pay | Admitting: Cardiovascular Disease

## 2010-08-04 ENCOUNTER — Ambulatory Visit (INDEPENDENT_AMBULATORY_CARE_PROVIDER_SITE_OTHER): Payer: Medicare Other | Admitting: Cardiovascular Disease

## 2010-08-04 ENCOUNTER — Encounter: Payer: Self-pay | Admitting: Cardiovascular Disease

## 2010-08-04 DIAGNOSIS — E785 Hyperlipidemia, unspecified: Secondary | ICD-10-CM

## 2010-08-04 DIAGNOSIS — I10 Essential (primary) hypertension: Secondary | ICD-10-CM

## 2010-08-04 DIAGNOSIS — I2510 Atherosclerotic heart disease of native coronary artery without angina pectoris: Secondary | ICD-10-CM

## 2010-08-04 NOTE — Patient Instructions (Addendum)
Follow-up appointment with Dr Eden Emms in 1year. Labs with Dr Arthur Holms in 6 months Call if any chest pain

## 2010-08-04 NOTE — Assessment & Plan Note (Signed)
Stable continue statin Labs per primary  LDL 43 on 12/11  Lab Results  Component Value Date   LDLCALC 43 05/07/2010

## 2010-08-04 NOTE — Progress Notes (Signed)
HPI  [name] [name] is seen today for F/U of elevated lipids and CAD.  He has a stent to the LAD on 11/08  which was widely patient by  cath 12/10  as part of the Saturn Trial.  He denies SSCP.  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  He is compliant with his meds and has less myalgias on Crestor.  His BP is under good control and he is trying to limit his sodium intake  He sees Dr Arthur Holms who follows his prostatism.  He requires some ambien to sleep.  Last winter had ? Migraine and had w/u for TIA which was negative with no carotid disease and no MRI/MRA abnormalities  ROS: Denies fever, malais, weight loss, blurry vision, decreased visual acuity, cough, sputum, SOB, hemoptysis, pleuritic pain, palpitaitons, heartburn, abdominal pain, melena, lower extremity edema, claudication, or rash.   General:  Affect appropriate Healthy:  appears stated age HEENT: normal Neck supple with no adenopathy JVP normal no bruits no thyromegaly Lungs clear with no wheezing and good diaphragmatic motion Heart:  S1/S2 no murmur,rub, gallop or click PMI normal Abdomen: benighn, BS positve, no tenderness, no AAA no bruit.  No HSM or HJR Distal pulses intact with no bruits No edema Neuro non-focal Skin warm and dry No muscular weakness   Current Outpatient Prescriptions  Medication Sig Dispense Refill  . ALPRAZolam (XANAX) 0.25 MG tablet Take 0.25 mg by mouth. 1/2 to 1 tab prn       . aspirin 81 MG tablet Take 81 mg by mouth daily.        . calcium carbonate (OS-CAL) 600 MG TABS Take 600 mg by mouth daily.        Marland Kitchen dutasteride (AVODART) 0.5 MG capsule Take 0.5 mg by mouth daily.        . Garlic-Lecthin 161-096 MG CAPS 1 tab po qd       . lisinopril (PRINIVIL,ZESTRIL) 10 MG tablet Take 10 mg by mouth daily.        . nitroGLYCERIN (NITROSTAT) 0.4 MG SL tablet Place 0.4 mg under the tongue every 5 (five) minutes as needed.        . Psyllium  (MEDI-MUCIL PO) Take 240 g by mouth daily. Every morning       . rosuvastatin (CRESTOR) 40 MG tablet Take 40 mg by mouth daily.        . Tamsulosin HCl (FLOMAX) 0.4 MG CAPS 1 tab by mouth daily       . zolpidem (AMBIEN) 10 MG tablet Take 10 mg by mouth at bedtime as needed.        Marland Kitchen DISCONTD: aspirin 325 MG tablet Take 325 mg by mouth daily.           Review of patient's allergies indicates no known allergies.

## 2010-08-04 NOTE — Assessment & Plan Note (Signed)
Well controlled Continue ACE and low sodium diet

## 2010-08-04 NOTE — Assessment & Plan Note (Signed)
Stent proximal LAD 2008  Patent by cath 2010 Active with no angina  Continue asa

## 2010-09-29 NOTE — Discharge Summary (Signed)
NAMEMarland Kitchen  [name] [name], [name] [name]                ACCOUNT NO.:  [id number]   MEDICAL RECORD NO.:  [id number]          PATIENT TYPE:  INP   LOCATION:  6522                         FACILITY:  [facility]   PHYSICIAN:  [name]. Eden Emms, MD, FACCDATE OF BIRTH:  11-04-1932   DATE OF ADMISSION:  03/28/2007  DATE OF DISCHARGE:  03/30/2007                         DISCHARGE SUMMARY - REFERRING   DISCHARGING PHYSICIAN:  Theron Arista C. Eden Emms, MD.   HISTORY:  [name] [name] is a 77 year old,  white male who was referred to Dr.  Fabio Bering office clinic somewhat urgently for chest discomfort.  Apparently he was in the hospital over the weekend and ruled out for  myocardial infarction and was set up to have a stress test.  However,  his Myoview was markedly positive in the anterior apical wall associated  with EKG changes.  He was brought immediately to Dr. Fabio Bering clinic.  The patient had described his discomfort as particularly related to  exertion sudden in onset with increasing frequency over the preceding 2  weeks.  His risk factors include hypertension and positive family  history.  He also has a history of constipation, irritable bowel  syndrome and BPH.   LABORATORY DATA:  Admission H&H was 14.4 and 41.5, normal indices,  platelets 241, WBCs 4.9. At discharge, H&H was 12.9 and 37.6, normal  indices.  Platelets 196, WBCs 4.8. Admission PT was 12.3, sodium 134,  potassium 3.9, BUN 11, creatinine 0.91, glucose 112. At discharge,  sodium is 139, potassium 4.0, BUN 11, creatinine 0.96, glucose 86.  Post  procedure CK-MB was 57 and 1.5.  Fasting lipids showed a total  cholesterol of 221, triglycerides 264, HDL 49, LDL 119. Chest x-ray that  had been performed on the 8th showed hyperexpansion without acute  cardiopulmonary processes. Admission EKG showed normal sinus rhythm,  normal axis, early R-wave, nonspecific ST-T wave changes.  EKG at the  time of discharge postprocedure showed normal sinus rhythm. He did have  some new  T-wave inversion in 1 and AVL and in V2 his T-wave was  biphasic, otherwise unremarkable.   HOSPITAL COURSE:  The patient was admitted to [facility] and placed  on IV heparin and Integrilin as well as beta blocker and cholesterol  agent. He did not have any further chest discomfort and cardiac  catheterization was arranged.  The research team saw him in regards to  SATURN study.  On March 29, 2007, Dr. Juanda Chance performed  catheterization.  This showed a 30% proximal RCA 95/50% proximal LAD,  30% distal circumflex with an EF of 60%.  Dr. Riley Kill performed drug-  eluting stent to the LAD reducing these lesions to 0%. By the 13th, he  was ambulating without difficulty. The catheterization site did show  some slight oozing after closed with AngioSeal.  Post ambulation and  cardiac rehab, it was felt that he could possibly be discharged home.  Prior to discharge, [name] [name] will also be evaluated by the research  team in regards to the SATURN study.   DISCHARGE DIAGNOSIS:  1. ACS.  2. Positive stress Myoview.  3. Coronary artery disease.  4.  Status post drug-eluting stent to the left anterior descending.  5. Hyperlipidemia.  6. History of hypertension.  7. History as listed below.   PROCEDURES PERFORMED:  Cardiac catheterization with drug-eluting stent  performed to the proximal LAD by Dr. Juanda Chance on March 29, 2007.   DISPOSITION:  The patient is discharged home, asked to maintain low-  sodium heart-healthy diet.  Wound care and activities are per  supplemental sheet.   DISCHARGE MEDICATIONS:  1. Aspirin 325 mg daily.  2. Plavix 75 mg daily.  3. Lipitor 80 mg q.h.s.  4. Nitroglycerin 0.4 as needed.  He is also instructed he could continue calcium, garlic, flax seed oil,  Metamucil and Flomax as previously.   In regards to the SATURN study, his core LDL has already been sent to  the lab.  He will receive a phone call and may possibly be discontinuing  Lipitor and being  placed on the SATURN study drug in the future.  If he  is maintained on the SATURN trial, they will be following his lipids and  LFTs.  If he is not entered into the SATURN trial, he will need fasting  LFTs and FLPs in 6-8 weeks.  He was asked to bring all medications to  all appointments.  He will follow up with Dr. Fabio Bering PA next week on  April 05, 2007 at 1:15 in regards to slight oozing of his  catheterization site at the time of discharge.   DISCHARGE TIME:  35 minutes.      Joellyn Rued, PA-C      [name]. Eden Emms, MD, [facility]  Electronically Signed    EW/MEDQ  D:  03/30/2007  T:  03/30/2007  Job:  [id number]   cc:   Rosalyn Gess. Norins, MD

## 2010-09-29 NOTE — Assessment & Plan Note (Signed)
[facility]                            CARDIOLOGY OFFICE NOTE   NAME:[name], [name] ORRICO                  MRN:          [id number]  DATE:04/05/2007                            DOB:          08-27-1932    This is a 77 year old married white male patient of Dr. Eden [name], who was  admitted to [facility] urgently on November 11 with a markedly  positive abnormal Myoview and EKG changes.  The patient underwent  cardiac catheterization and successful stenting of the proximal LAD with  a Promus drug-eluting stent and IVUS guidance, improving the narrowing  from 95% down to 0.  IVUS of the circumflex artery for the SATURN study  showed moderate amount of plaque in the mid circumflex.  He had normal  LV function with no areas of hypokinesis.  He had ruled out for an MI.   Since the patient has been home, he has done quite well.  He is walking  daily without problems.  He denies any further chest pain, palpitations  or shortness of breath.   CURRENT MEDICATIONS:  1. Plavix 75 mg daily.  2. Lipitor 80 mg q.h.s.  3. Aspirin 81 mg daily.  4. Avodart daily.  5. Flomax daily.  6. Calcium 600 mg daily.  7. Garlic 650 mg daily.  8. Metamucil daily.   PHYSICAL EXAM:  This is a pleasant but anxious 77 year old white male in  no acute distress.  Blood pressure 118/68, pulse 85, weight 159.  NECK:  Without JVD, HJR, bruit or thyroid enlargement.  LUNGS:  Clear anterior, posterior and lateral.  HEART:  Regular rate and rhythm at 85 beats per minute, normal S1 and  S2.  No murmur, rub, bruit, thrill or heave noted.  ABDOMEN:  Soft without organomegaly, masses, lesions or abnormal  tenderness.  RIGHT GROIN:  He has a moderate amount of ecchymosis.  He has a slightly  widened pulse without bruit.  LOWER EXTREMITIES:  Without cyanosis, clubbing or edema.  He has good  distal pulses.   IMPRESSION:  1. Coronary artery disease, status post stenting to the proximal  left      anterior descending artery on March 29, 2007.  2. Right groin hematoma, rule out pseudoaneurysm.  3. Hyperlipidemia.   PLAN AT THIS TIME:  The patient is stable from a cardiac standpoint.  We  will check a duplex of his right groin to rule out pseudoaneurysm.  I  have asked him to hold off on lifting weights until his groin is  completely healed and he will see Dr. Eden [name] back in 2 months.      [name] Reedy, PA-C  Electronically Signed      [name] [name]. [name] Chance, MD, [facility]  Electronically Signed   ML/MedQ  DD: 04/05/2007  DT: 04/06/2007  Job #: [id number]

## 2010-09-29 NOTE — Assessment & Plan Note (Signed)
[facility]                             PULMONARY OFFICE NOTE   [name] [name], [name] [name]                  MRN:          [id number]  DATE:10/13/2006                            DOB:          03-26-1933    PRIMARY CARE PHYSICIAN:  Illene Regulus, M.D.   PROBLEM LIST:  Recurrent angio edema of the face.   HISTORY OF PRESENT ILLNESS:  He says that he has had no recurrence since  he was last here for initial visit with me 3 weeks ago. He has not  changed medications and has not needed routine antihistamines. He does  not notice nasal congestion, wheeze, adenopathy, or fever.   MEDICATIONS:  Aspirin 325 mg, calcium, garlic, flax seed oil, Metamucil,  Flomax. Doxazosin remains on hold. P.r.n. use of Nasarel, Xanax, Zantac,  Tylenol and Ambien.   ALLERGIES:  NO KNOWN DRUG ALLERGIES.   OBJECTIVE:  VITAL SIGNS:  Weight 160 pounds. Blood pressure 110/72,  pulse 82. Room air saturation 95%.  HEENT:  There is no evidence of angio edema or urticaria. I find no  adenopathy. Eyes, nose, and throat are clear.  CHEST:  Clear.   LABORATORY DATA:  Lab work from last visit included RAST test, positive  for specific IGE for dust mite, cat and dog dander's, grass, house dust.  His CBC with normal with a normal eosinophil percentage of 2.5.  Sedimentation rate was unremarkable at 10.   IMPRESSION:  Angio edema suggested by his history, may have faded. If  possible, this was a reaction to the Doxazosin that he has not been  taking but I suspect a lingering viral response because of the time of  year when it developed. Hopefully, it will remain gone.   PLAN:  We agreed that I would see him again p.r.n.     Clinton D. Maple Hudson, MD, [facility] Bollman, FACP  Electronically Signed    CDY/MedQ  DD: 10/16/2006  DT: 10/16/2006  Job #: [id number]

## 2010-09-29 NOTE — Assessment & Plan Note (Signed)
[facility]                            CARDIOLOGY OFFICE NOTE   GLENDALE, YOUNGBLOOD                       MRN:          [id number]  DATE:01/15/2008                            DOB:          1933/04/28    [name] returns today for followup.  He had a stent to his LAD in  November 2008.  He has been doing well.  He had a followup stress test  and had a hypertensive response.  We started him on lisinopril 10 mg a  day.  This has helped quite a bit and his blood pressure is in a better  range.  He is not having significant chest pain, PND, or orthopnea.  No  palpitations.  He is no longer on a statin drug, but he does take a  study drug in regards to his cholesterol.   His current meds otherwise include calcium, garlic, Plavix 75 a day,  aspirin a day, Avodart, cholesterol study drug, Flomax, and lisinopril  10 a day.   PHYSICAL EXAMINATION:  GENERAL:  Remarkable for somewhat anxious white  male in no distress.  VITAL SIGNS:  Weight is 156, blood pressure 120/68, pulse 80 and  regular, respiratory rate 14, and afebrile.  HEENT:  Unremarkable.  NECK:  Carotids are normal without bruit.  No lymphadenopathy,  thyromegaly, or JVP elevation.  LUNGS:  Clear.  Good diaphragmatic motion.  No wheezing.  CARDIAC:  S1 and S2.  Normal heart sounds.  PMI normal.  ABDOMEN:  Benign.  Bowel sounds positive.  No AAA.  No tenderness.  No  bruit.  No hepatosplenomegaly.  No hepatojugular reflux.  No tenderness.  EXTREMITIES:  Distal pulses are intact.  No edema.  NEURO:  Nonfocal.  SKIN:  Warm and dry.  No muscular weakness.   IMPRESSION:  1. Hypertension.  Currently, improved and controlled.  Continue low-      salt diet and lisinopril.  2. Coronary artery disease, previous stent to the left anterior      descending.  Continue aspirin and Plavix.  3. History of prostatism.  Continue Avodart.  I will check PSA in 6      months.  Follow up primary care.  4.  Cholesterol study drug.  Follow up with research nurses.  I suspect      it is part of the study that his LFTs and LDL will be followed.   Overall, I think he is doing well.     [name]. Eden Emms, MD, [facility]  Electronically Signed    PCN/MedQ  DD: 01/15/2008  DT: 01/16/2008  Job #: [id number]

## 2010-09-29 NOTE — Cardiovascular Report (Signed)
NAME:  [name] [name], [name] [name]                ACCOUNT NO.:  [id number]   MEDICAL RECORD NO.:  [id number]          PATIENT TYPE:  INP   LOCATION:  6522                         FACILITY:  [facility]   PHYSICIAN:  Everardo Beals. Juanda Chance, MD, FACCDATE OF BIRTH:  October 22, 1932   DATE OF PROCEDURE:  03/29/2007  DATE OF DISCHARGE:                            CARDIAC CATHETERIZATION   PROCEDURE:  Cardiac catheterization, percutaneous coronary intervention  and IVUS study.   HISTORY:  Mr. Atkerson is 77 years old and recently developed symptoms of  chest pain and was hospitalized and ruled out for myocardial infarction  and had a Myoview scan done yesterday which was markedly positive for  anterior ischemia.  He was seen by Dr. Eden Emms and admitted to the  hospital for evaluation and angiography today.   PROCEDURE:  The procedure was performed by the right femoral artery  using an arterial sheath and 6-French preformed coronary catheters.  A  front wall arterial puncture was performed and Omnipaque contrast was  used.  After completion of diagnostic study, made a decision to proceed  with intervention on the lesion in the proximal left anterior descending  artery.   The patient had been on heparin and Integrilin and was given additional  heparin to prolong ACT greater than 200 seconds.  He was given 600 mg of  Plavix at the end of the procedure and received a chewable aspirin on  the morning of the procedure.  We used a Q4 6-French guiding catheter  with side holes.  We passed a Prowater wire down across the lesion and  down the LAD without difficulty.  We predilated with a 2.25 x 50-mm  Maverick performing 2 inflations up to 8 atmospheres for 20 seconds.  We  then deployed a 2.75 x 18-mm Promus stent, deploying this with 1  inflation of 12 atmospheres for 20 seconds.  Proximal edge of the stent  ended right at the first diagonal branch and the distal end of the stent  ended right at the second diagonal branch.  We then  postdilated the  stent with a 3.0 x 50-mm Quantum Maverick performing 2 inflation of 16  atmospheres for 20 seconds.  We then performed an IVUS run with Atlantis  IVUS catheter and automatic pullback.  This demonstrated that the stent  was well expanded and well opposed.   The patient had been consented for the Saturn trial.  We next performed  IVUS of the circumflex artery as part of this trial.  We passed the new  Prowater wire down the circumflex artery.  Passed an Atlantis catheter  about 2/3 the way down the circumflex artery and did automatic pullback.  Final diagnostic studies were then performed through the guiding  catheter.  The right femoral artery was closed with Angio-Seal at the  end of the procedure.  The patient tolerated the procedure well and left  the laboratory in satisfactory condition.   RESULTS:  THE LEFT MAIN CORONARY:  The left main coronary artery was  free of significant disease.   THE LEFT ANTERIOR DESCENDING ARTERY:  The left anterior descending  artery gave rise to 2 diagonal branches, septal perforator, and the  third diagonal branch.  There was 95% stenosis at the first diagonal  branch and a tandem 50% stenosis just before the second diagonal branch.   THE CIRCUMFLEX ARTERY:  The circumflex artery gave rise to a ramus  branch and atrial branch, a small marginal branch and a posterolateral  branch.  There was 30% narrowing in the mid-to-distal circumflex artery  after the small marginal branch.   THE RIGHT CORONARY ARTERY:  The right coronary artery was a moderate-  sized vessel that gave rise to 2 right ventricular branches, posterior  descending branch, and 2 posterolateral branches.  There was 30%  narrowing in the mid right coronary.   THE LEFT VENTRICULOGRAM:  The left ventriculogram performed in the RAO  projection showed good wall motion with no areas of hypokinesis.  The  estimated ejection fraction was 60%.   The aortic pressure was 102/56  with a mean of 77.  Left ventricular  pressure was 102/13.   CONCLUSION:  1. Coronary artery disease with 95% stenosis in the proximal LAD, 30%      narrowing in the mid-to-distal circumflex artery, 30% narrowing in      the mid right coronary and normal LV function.  2. Successful PCI of the lesion in the proximal LAD using a Promus      drug-eluting stent and IVUS guidance with improvement in center      narrowing from 95% to 0%.  3. IVUS of the circumflex artery for the Saturn study with a moderate      amount of plaque in the mid circumflex artery.   DISPOSITION:  The patient returned to post angio room for further  observation.  He should be on Plavix for at least a year.      Bruce Elvera Lennox Juanda Chance, MD, [facility]  Electronically Signed     BRB/MEDQ  D:  03/29/2007  T:  03/30/2007  Job:  [id number]   cc:   Rosalyn Gess. Norins, MD  [name]. Eden Emms, MD, [facility]  Bruce R. Juanda Chance, MD, [facility]  Cardiopulmonary Lab

## 2010-09-29 NOTE — Assessment & Plan Note (Signed)
[facility]                            CARDIOLOGY OFFICE NOTE   [name] [name], [name] [name]                  MRN:          [id number]  DATE:03/28/2007                            DOB:          1932-07-29    [name] was seen today as a semi-urgent add on.   He is a 77 year old patient of Dr. Debby Bud.  He was just in the hospital  over the weekend for chest pain.  Apparently, he ruled out for  myocardial infarction and was set up to have a Myoview.  His Myoview was  markedly positive with gross 4/4 ischemia in the anterior apical wall.  The patient had chest pain with exercise that was 5/10 with EKG changes.  He was brought immediately to our pod.  I talked with the patient at  length.  His chest pain is crescendo.  It has been going on for 2 weeks.  It is fairly classic of exertional angina.  It is very concerning in  regards to sudden onset and increasing frequency.  I explained to him  that he needed to be hospitalized.  We will transport him to [facility] by EMS, and he will be placed on Integrilin and heparin, as  well as aspirin and beta blockers.  In talking to the patient, he has no  previously documented coronary artery disease.  His coronary risk  factors include hypertension and positive family history.   He has otherwise been doing well.   PAST MEDICAL HISTORY:  Unremarkable, except for some:  1. Constipation.  2. Hypertension.  3. Irritable bowel syndrome.  4. BPH.   PAST SURGICAL HISTORY:  Status post appendectomy.   Family History: non-contributory   CURRENT MEDICATIONS:  1. An aspirin a day.  2. Calcium.  3. Garlic.  4. Flax seed oil.  5. Metamucil.  6. Doxazosin 8 mg daily.  7. Flomax.   ALLERGIES:  He has no known allergies.   SOCIAL HISTORY:  The patient is married.  His wife is coming to the  office to meet him.  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  He does not smoke  or drink.  He is  active, except for the last 2 weeks since he has had chest pain.   PHYSICAL EXAMINATION:  GENERAL:  A pale, elderly white male in no  distress.  VITAL SIGNS:  Blood pressure is 150/80, pulse 80 and regular, afebrile,  respiratory rate 14.  HEENT:  Unremarkable.  NECK:  Carotids normal.  No bruits.  No lymphadenopathy or thyromegaly  or JVP elevation.  LUNGS:  Clear.  Good diaphragmatic motion.  No wheezing.  HEART:  S1 and S2, normal heart sounds.  PMI normal.  ABDOMEN:  Benign.  Bowel sounds positive.  No AAA.  No tenderness.  No  hepatosplenomegaly, no hepatojugular reflux, no bruits.  EXTREMITIES:  Distal pulses intact, no edema.  NEUROLOGIC:  Nonfocal.   ELECTROCARDIOGRAM:  His baseline EKG shows sinus rhythm with nonspecific  ST-T wave changes.   IMPRESSION:  1. Unstable angina.  Hospitalization for heparin and Integrilin.  Aspirin given in the office.  IV kept in from stress test.  Beta      blockers.  2. Hypertension.  Continue beta blocker.  3. Prostatism.  Continue Flomax.   The risks of catheterization were discussed with the patient.  He will  be transported by EMS to [facility] to a telemetry bed.  I have placed him on  the board for Dr. Juanda Chance for first case angioplasty.     [name]. Eden Emms, MD, [facility]  Electronically Signed    PCN/MedQ  DD: 03/28/2007  DT: 03/29/2007  Job #: [id number]

## 2010-09-29 NOTE — Discharge Summary (Signed)
[name], [name]                ACCOUNT NO.:  [id number]   MEDICAL RECORD NO.:  [id number]          PATIENT TYPE:  INP   LOCATION:  1407                         FACILITY:  [facility]   PHYSICIAN:  Rosalyn Gess. Norins, MD  DATE OF BIRTH:  1933/01/05   DATE OF ADMISSION:  03/25/2007  DATE OF DISCHARGE:  03/25/2007                               DISCHARGE SUMMARY   ADMITTING DIAGNOSIS:  Chest pain.   DISCHARGE DIAGNOSIS:  Atypical chest pain, myocardial infarction ruled  out.   CONSULTANTS:  None.   PROCEDURES:  None.   HISTORY OF PRESENT ILLNESS:  The patient is a 77 year old Caucasian  gentleman followed by Dr. Debby Bud in the office for general routine  medical care.  He has no prior history of any cardiac disease.  He  reports that over the past several days he has noticed that with  exertion he will have some substernal chest discomfort which he  describes as sharp.  There has been no radiation of pain or discomfort.  No associated symptoms, specifically no shortness of breath and  diaphoresis.  The patient did report that he has noticed that the  episodes of discomfort had become more intense over the past several  days, such that on Friday when he went walking, the pain was such that  he had to lie down.  The patient had recurrent chest pain later in the  evening and presented to [facility] Emergency Department for  evaluation.  The patient was subsequently admitted to rule out MI.   HOSPITAL COURSE:  The patient was admitted to a telemetry unit for  observation.  His telemetry tracings were unremarkable and normal.  Cardiac markers were cycled with point-of-care CK-MB of 1.4 with  troponin I of less than 0.05.  Cardiac panel at 0700 hours with a CK-MB  of 1.9 with a troponin-I of 0.04, third panel at time 12:43 p.m. with a  CK-MB of 1.7 and troponin I of 0.03.   During the patient's hospital stay, he remained pain-free and  comfortable.  Additional laboratory was drawn  including a TSH, which was  normal at 3.128, and a lipid profile revealing a cholesterol of 210,  triglycerides of 210, HDL cholesterol of 49, LDL cholesterol of 119.  Basic metabolic panel was unremarkable with a glucose of 105.  CBC was  normal.   With the patient having ruled out by cardiac enzymes, being pain-free  and asymptomatic with normal telemetry tracings, he is thought to be  stable and ready for discharge home.   The patient was examined in the early morning of the day of discharge,  had temperature of 97.9, blood pressure 108/73, heart rate 70,  respirations 18.  O2 SAT was 99% on 2 L.  General Appearance:  This is a  slender gentleman who was actually walking about the room in no acute  distress.  Chest was clear with no rales, wheezes or rhonchi.  Cardiovascular exam revealed 2+ pulses and a quiet precordium with  regular rate and rhythm.   DISCHARGE MEDICATIONS:  The patient will resume all his home  medications  basically consisting of aspirin.   ACTIVITY:  The patient is advised to avoid rigorous activity and  exercise until further notice.   FOLLOWUP:  He will be scheduled for an outpatient nuclear stress test at  [facility].   CONDITION ON DISCHARGE:  The patient's condition at time of discharge  dictation is stable, pain-free and has ruled out for MI.      Rosalyn Gess Norins, MD  Electronically Signed     MEN/MEDQ  D:  03/25/2007  T:  03/27/2007  Job:  [id number]

## 2010-09-29 NOTE — Assessment & Plan Note (Signed)
[facility]                            CARDIOLOGY OFFICE NOTE   [name], [name]                       MRN:          [id number]  DATE:06/18/2008                            DOB:          09/29/1932    [name] returns today for followup.  The patient has had angioplasty  and stenting of the LAD in November 2008.  He is now approximately 14  months post.  He has been having significant bruising from his Plavix.  He actually took a bandage off in the room today and had a 2 x 2 cm  piece of skin come off his hand.  He had placed a bandage on there to  cover up his bruising.  Since he has been stable without any significant  chest pain, a nonischemic Myoview postprocedure.  I told him we could  stop his Plavix.  Dr. Juanda Chance had recommended 1 years' treatment and he  has succeeded with this.   He will continue aspirin.  He is otherwise doing well.   REVIEW OF SYSTEMS:  Remarkable for continued prostatism.  He requires  both Avodart and Flomax.  He does not have a urologist and follows up  with Dr. Debby Bud for this.   CURRENT MEDICATIONS:  1. Calcium.  2. Garlic.  3. Metamucil.  4. Plavix 75 a day to be stopped and aspirin a day.  5. Avodart 5 mg a day.  6. Study drug, which is a cholesterol agent.  7. Flomax, the dose unknown.  8. Lisinopril 10 a day.  9. Aspirin a day.   PHYSICAL EXAMINATION:  GENERAL:  Remarkable for a thin, affable male in  no distress.  He does have multiple ecchymoses particularly in his right  hand.  VITAL SIGNS:  Blood pressure is 120/70, pulse 70 and regular,  respiratory 14, and afebrile.  HEENT:  Unremarkable.  NECK:  Carotids are normal without bruit.  No lymphadenopathy,  thyromegaly, or JVP elevation.  LUNGS:  Clear.  Good diaphragmatic motion.  No wheezing.  S1 and S2.  Normal heart sounds.  PMI normal.  ABDOMEN:  Benign.  Bowel sounds positive.  No AAA.  No tenderness.  No  bruit.  No hepatosplenomegaly.  No  hepatojugular reflux.  EXTREMITIES:  Distal pulses are intact.  No edema.  NEUROLOGIC:  Nonfocal.  SKIN:  Warm and dry.  MUSCULOSKELETAL:  No muscular weakness.   IMPRESSION:  1. Coronary artery disease, stent to the left anterior descending      coronary artery.  Continue aspirin therapy.  Consider adding beta-      blocker therapy in the future, although I believe the patient has      had fatigue on beta-blockers in the past.  2. Bruising on aspirin and Plavix.  He has finished 14 months of      Plavix.  Post drug-eluting stent to the  left anterior descending      coronary artery.  Stop Plavix and allow the skin to heal.  I will      resume Plavix only for high risk Myoview or current symptoms.  3. Prostatism.  Continue Flomax and Avodart.  Prostate-specific      antigen in 6 months.  Follow up with Dr. Debby Bud.  4. Hypercholesterolemia.  Continue study drug.  Follow up with      research nurses in regards to routine lab work.  I will see him      back in July for his followup stress Myoview.     [name]. Eden Emms, MD, [facility]  Electronically Signed    PCN/MedQ  DD: 06/18/2008  DT: 06/19/2008  Job #: [id number]

## 2010-09-29 NOTE — H&P (Signed)
NAME:  [name] [name], [name] [name]                ACCOUNT NO.:  [id number]   MEDICAL RECORD NO.:  [id number]          PATIENT TYPE:  INP   LOCATION:  0101                         FACILITY:  [facility]   PHYSICIAN:  Candyce Churn, M.D.DATE OF BIRTH:  Oct 30, 1932   DATE OF ADMISSION:  03/25/2007  DATE OF DISCHARGE:                              HISTORY & PHYSICAL   CHIEF COMPLAINT:  Exertional chest pain over the last 24-48 hours now  occurring at rest.   HISTORY OF PRESENT ILLNESS:  Mr. Mcfadyen is a very pleasant 77 year old  male with onset of exertional chest tightness yesterday with severe  fatigue.  Today he has developed the substernal chest pressure with  exertion, occurring at rest.  This does not radiate.  It is not  associated with diaphoresis, nausea or shortness of breath.  He  presented [facility] Emergency Room and had relief of chest  discomfort with sublingual nitroglycerin but has required repeated doses  of nitroglycerin to keep the chest pain relieved.  Reports a history of  indigestion at times and uses Tagamet but this discomfort is different.  He went for a walk yesterday and had severe fatigue and had to lie down  on the floor of his garage after walking.  Again, the pain does not  radiate to his jaw, neck, shoulder, arm or back.  It is a pressure  sensation and substernal.  He is now admitted to rule out unstable  angina.   PAST MEDICAL HISTORY:  1. Seasonal allergic rhinitis.  2. Benign prostatic hypertrophy.  3. Moderate elevations of cholesterol.  4. Episodic indigestion.  5. Mild insomnia.   MEDICATIONS:  1. Flomax 0.4 mg p.o. nightly.  2. Aspirin 81 mg daily.  3. Episodic use of Alavert.  4. Episodic use of Tagamet.  5. Episodic use of Ambien 10 mg h.s. p.r.n. insomnia.   PAST SURGICAL HISTORY:  Appendectomy.   FAMILY HISTORY:  Significant for brother with CABG at age 89 and he was  not a smoker.  He is now age 44.  Father died of an MI at age 42 but  was  a smoker.  Mother died at age 22 with complications from rheumatoid  arthritis.   SOCIAL HISTORY:  Denies tobacco use.  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  His wife is present  with him in the ER and very supportive.  He has two daughters and a son  in their late 78s and early 56s, all in good health.   REVIEW OF SYSTEMS:  The patient reports chest pain syndrome in May 2006,  where he went to the emergency room and found to be indigestion.  Again,  he denies fevers, chills, nausea, vomiting, diarrhea or diaphoresis.   PHYSICAL EXAMINATION:  GENERAL APPEARANCE:  This is an alert male in no  acute distress.  VITAL SIGNS:  Blood pressure 136/69, temperature 97.5, pulse 70 and  regular, respirations 16 and unlabored.  O2 saturation on 2 liters 97%.  HEENT:  Exam is benign.  NECK:  Supple without JVD  or thyromegaly or bruits or lymphadenopathy.  CHEST:  Clear to auscultation.  CARDIAC:  Regular rhythm.  No murmurs or gallops.  ABDOMEN:  Soft, nontender.  No obvious organomegaly.  Bowel sounds are  normal.  No pain to palpation.  EXTREMITIES:  No cyanosis, clubbing or edema.  Warm distally.  Good  capillary refill.  NEUROLOGIC:  Alert and oriented x3, nonfocal.   EKG reveals sinus rhythm at 67 with an incomplete right bundle branch  block.  No ST-segment elevation or depression.   Chest x-ray is clear but appears slightly hyperexpanded.   LABORATORY DATA:  White cell count 4200, hemoglobin 13.4, platelet count  233,000.  Sodium 139, potassium 3.8, chloride 107, bicarb 26, BUN 15,  creatinine 0.9, glucose 105, calcium 9.4.  CK-MB 1.4, troponin I is less  than 0.05 and myoglobin of 73.2.   ASSESSMENT:  A 77 year old male with chest pain syndrome consistent with  crescendo angina.  It could also be esophageal reflux with spasm.  He  does have a significant family history of coronary artery disease.   PLAN:  Admit.  Nasal cannula  O2, subcu Lovenox 1 mg/kg q.12h., aspirin,  Protonix and IV nitroglycerin.  Will check serial cardiac enzymes and  repeat EKG in a.m. and check fasting lipid profile and TSH.  The patient  will definitely need  cardiac consultation.      Candyce Churn, M.D.  Electronically Signed     RNG/MEDQ  D:  03/25/2007  T:  03/26/2007  Job:  [id number]

## 2010-09-29 NOTE — Assessment & Plan Note (Signed)
[facility]                            CARDIOLOGY OFFICE NOTE   Stokes, Rattigan [name] [name]                  MRN:          [id number]  DATE:06/06/2007                            DOB:          Mar 05, 1933    HISTORY:  Mr. Schowalter returns today for followup.  I saw him as an urgent  add-on at the end of November for markedly positive Myoview.   The patient had marked ischemia in the anterior wall.  He had a 95%  stenosis in the proximal LAD without critical disease in his other  arteries.  I think he had a Promus drug-eluting stent placed by Dr.  Juanda Chance.  Since that time, he has done well post-cath.  He needed an IVUS  to rule out fistula, but his leg has now healed well.  He has been  compliant with his medications.  He has not been on a beta blocker due  to relative bradycardia.   He has been compliant with his aspirin and Plavix.  He has not had any  significant chest pain.  He has been active.  He walks 2 miles a day in  less than 30 minutes.  He carries his nitro with him, but has not had to  use it.  He needs a follow up lipid and liver profile in 3 months.   REVIEW OF SYSTEMS:  Remarkable for prostatism.  He is trying to wean  himself off Flomax and take Avodart only.  He says his urinary stream  has been good.  His review of systems otherwise negative.   CURRENT MEDICATIONS:  1. Calcium.  2. Garlic.  3. Metamucil.  4. Plavix 75 a day.  5. Lipitor 80 a day.  6. Aspirin a day.  7. Avodart.  8. Flomax.  9. I believe Saturn study drug.   PHYSICAL EXAMINATION:  VITAL SIGNS:  Remarkable for a heart rate of 68  with occasional PACs.  His weight 159, blood pressure 114/70,  respiratory rate 16, afebrile.  HEENT:  Unremarkable.  Carotids are without bruit.  No lymphadenopathy,  thyromegaly or JVP elevation.  LUNGS:  Clear with good diaphragmatic motion.  No wheezing.  S1-S2  normal heart sounds.  PMI normal.  ABDOMEN:  Benign.  Bowel sounds are  positive.  No AAA, no bruit, no  hepatosplenomegaly, no hepatojugular reflux.  No tenderness.  EXTREMITIES:  Femorals are +3 bilaterally without bruit.  Right groin is  well healed.  Distal pulses are intact, no edema.  NEURO:  Nonfocal.  SKIN:  Warm and dry.  No muscular weakness.   DIAGNOSTICS:  EKG is essentially normal.  He does have occasional PACs  and sinus arrhythmia.   IMPRESSION:  1. Coronary disease.  Stent to the proximal left anterior descending.      Follow up Myoview in 6 months.  Continue aspirin and Plavix.  2. Hypertension, currently well controlled.  We will see where his      blood pressure lies when he is off his Flomax.  Continue a low-salt      diet.  3. Hypercholesterolemia in the setting of coronary disease.  Continue      high-dose Lipitor 80 a day.  Lipid and liver profile in 3 months.  4. Premature atrial contractions with sinus arrhythmia, currently      asymptomatic.  At some point, he may need a Holter monitor.  He      will let me know if he has any palpitations or undue high heart      rate and we would consider starting a beta blocker.  5. Prostatism, Flomax being stopped.  Continue Avodart for decreasing      prostate volume.  Follow up with Dr. Debby Bud.  6. Saturn study drug to follow up with our research nurses   FOLLOW UP:  I will see [name] [name] in about 6 months.     [name]. Eden Emms, MD, [facility]  Electronically Signed    PCN/MedQ  DD: 06/06/2007  DT: 06/06/2007  Job #: [id number]

## 2010-09-29 NOTE — Assessment & Plan Note (Signed)
[facility]                            CARDIOLOGY OFFICE NOTE   [name], [name]                       MRN:          [id number]  DATE:11/16/2007                            DOB:          28-Jun-1932    [name] returns today for followup.  He has had a previous stent to  the LAD.  He had a Myoview study today, it was nonischemic.  However, he  was quite hypertensive developing a systolic BP of 225.  I talked to him  at length about this last time I saw him.  His Flomax had been stopped  for his prostatism and I told him his blood pressure may elevate, it  clearly has, given his known coronary artery disease.  We will start him  on lisinopril.  We could have chosen a beta blocker, but I think he will  have better tolerance to the ACE inhibitor.  He has a history of sinus  arrhythmia and PACs, but these are asymptomatic, and he is not having  palpitations.  I went over his Myoview with him in detail.  There was no  evidence of in-stent restenosis to the LAD.  He has normal perfusion  with an EF of 57%.   He has been compliant with his medications.   REVIEW OF SYSTEMS:  Remarkable for compliant with a low-sodium diet.  He  has the ability to take his blood pressure at home and want to keep  blood pressure chart for me over the next few weeks.  He is on calcium,  garlic, and Metamucil, Plavix 75 a day, aspirin a day, Avodart, and  statin study drug, which is an anti-cholesterol pill.   PHYSICAL EXAMINATION:  GENERAL:  Remarkable for a thin white male in no  distress.  VITAL SIGNS:  Weight is 182; blood pressure is 178/63; pulse 87 and  regular; and respiratory rate 14, afebrile.  Affect appropriate.  HEENT:  Unremarkable.  NECK:  Carotids are without bruit.  No lymphadenopathy, thyromegaly, or  JVP elevation.  LUNGS:  Clear diaphragmatic motion.  No wheezing.  S1 and S2 with an S4  gallop.  PMI normal.  ABDOMEN:  Benign.  Bowel sounds positive.   No AAA.  No tenderness.  No  bruit.  No hepatosplenomegaly.  No hepatojugular reflux.  EXTREMITIES:  Distal pulses intact.  No edema.  NEURO:  Nonfocal.  SKIN:  Warm and dry.  MUSCULOSKELETAL:  No muscular weakness.   DIAGNOSTIC IMAGING:  EKG shows an incomplete right bundle branch block.  Stress test reviewed in detail.  He was able to exercise for 11 minutes.  His systolic pressure rose to 225.  Peak heart rate was 146.  EF 57%  with nonischemic Myoview.   IMPRESSION:  1. Coronary artery disease, previous stent to the left anterior      descending artery.  Continue aspirin therapy and consider addition      of beta-blocker.  2. Hypercholesterolemia.  Continue statin study drug.  3. Hypertension, lisinopril 10 mg a day added.   We will see the patient back in 6-8  weeks.     [name]. Eden Emms, MD, [facility]  Electronically Signed    PCN/MedQ  DD: 11/16/2007  DT: 11/17/2007  Job #: [id number]

## 2010-10-02 NOTE — Assessment & Plan Note (Signed)
[facility]                             PULMONARY OFFICE NOTE   [name] [name], [name] [name]                  MRN:          [id number]  DATE:09/20/2006                            DOB:          December 02, 1932    REFERRING PHYSICIAN:  Rosalyn Gess. Norins, MD   PROBLEM:  Allergy consultation at the kind request of Dr. Debby Bud for  this 77 year old man concerned about an allergic basis for episodes of  swelling of his face.   HISTORY:  Six months ago he had the first episode, onset between 2 a.m.  and lasting until 8 a.m. while he was in bed, of swelling in the tissues  of his cheek and then eyelids. Has had 2 or 3 these episodes now, the  longest of which lasted 36 hours. It is not always the same side of his  face. It does not involve his throat although he is concerned it might  challenge his breathing. He has had no prior similar episodes except  remotely when he had some urticaria within 20 minutes of [place] food.  There was no repeat when he rechallenged himself with more of the same  food later. He has not recognized any obvious infection symptoms.  Talking through usual suspect exposures nothing is identified. There has  been no change in household products. He did change to Flomax 3 months  ago. Overall health has been good. Usually he experiences days to weeks  between episodes. There is a remote allergy evaluation including allergy  skin testing when he was in the [employer]. At that time, attention was to  seasonal pollen inhalants primarily.   MEDICATIONS:  1. Aspirin 325 mg.  2. Calcium 600 mg.  3. Garlic 650 mg.  4. Flax seed oil 100 mg.  5. Metamucil.  6. Doxazosin 8 mg currently on hold since February.  7. Flomax.  8. Nasarel p.r.n.  9. Xanax.  10.Zantac.  11.Tylenol.  12.Ambien.   No medication allergy.   REVIEW OF SYSTEMS:  No obvious infection, no adenopathy. Weight has been  stable. No fever or sweat. No joint pain. No purulent or  bloody  discharge, wheeze, or cough, abdominal cramps or change in bowel or  bladder habit. Occasional nasal congestion with some difficulty  breathing through stuffy nose.   PAST MEDICAL HISTORY:  Elevated cholesterol, appendectomy, enlarged  prostate. No intolerance to latex, contrast dye or aspirin. There is a  remote history of sinus surgery. Degenerative disk disease cervical  spine, remote history of irritable bowel syndrome, depression and  anxiety.   SOCIAL HISTORY:  Two glasses of wine per day. He is married and lives  with his wife. 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 He is not usually around the construction site. As  hobbies he sails and walks his dogs. There are no new products being  used on his dogs that might relate to current complaints.   FAMILY HISTORY:  Allergies.   OBJECTIVE:  VITAL SIGNS:  Weight 166 pounds, blood pressure 126/72,  pulse 86, room air saturation 95%.  GENERAL:  This is a trim, alert,  comfortable-appearing man, pale  complexion. Skin is without visible rash, no adenopathy.  HEENT:  Nasal and oral mucosa looks normal. Conjunctiva are normal. No  stridor.  CHEST:  Quiet, clear lung fields, unlabored breathing.  HEART:  Regular rhythm without murmur or gallop.  ABDOMEN:  No enlargement of liver or spleen.  EXTREMITIES:  No edema or cyanosis.   LABORATORY DATA:  He had an eosinophil report at 8.2% on October 25, 2005.   IMPRESSION:  Episodic subacute angioedema mostly involving the face  without obvious trigger. My first thought would be his antiprostate  drugs although the pattern is not clear cut.   PLAN:  CBC with DIF, ANA, sed rate, __________ with IgG. He will return  in 3 weeks for followup and we will look at whether other evaluation  would be helpful at that time. We may want to do a therapeutic trial off  of his prostate drugs if he can tolerate it for a few weeks. I  appreciate the chance to see  him.     Clinton D. Maple Hudson, MD, [facility], FACP  Electronically Signed    CDY/MedQ  DD: 09/22/2006  DT: 09/23/2006  Job #: [id number]   cc:   Rosalyn Gess. Norins, MD

## 2010-10-02 NOTE — Letter (Signed)
May 12, 2006    Duke Salvia, MD, [facility]  [address]  [address]  [place], [place] [address]   RE:  [name] [name], [name] [name]  MRN:  [id number]  /  DOB:  1933/01/12   Dear [name] [name],   [name] [name] is a pleasant 77 year old gentleman I follow for  general medical care. He has been followed for hypertension, IBS, and  BPH. He had a full physical examination February 21, 2006 which is  available on his chart.   The patient sees me today reporting that he has now developed episodes  of a.m. dizziness. These will occur usually after being up and about for  20-30 minutes. This sensation of dizziness and disequilibrium is to the  extent where he has to lie down although he has not had any loss of  consciousness. He denies any palpitations or skipped or irregular heart  beats. He reports that these episodes will last anywhere from 10-20  minutes.   CURRENT MEDICATIONS:  1. Cardura 8 mg q.h.s. for BPH.  2. Aspirin 325 mg daily.   He does take multiple vitamins, garlic, flax seed.   His cardiovascular history is unremarkable. He did have a stress  Cardiolite study October 28, 2003 read out as sonographic images revealing  mild inferior basal thinning but no ischemia on the study with an EF of  59%. His EKG during that study was unremarkable and he had no  arrhythmias. The patient did undergo carotid Doppler examination October 28, 2003 which showed 0-39% RICA stenosis, 40-59% LICA stenosis. His  examination is unremarkable. The patient has been seen and evaluated by  Lucky Cowboy for high frequency left-sided sudden sensory neural hearing  loss with associated tinnitus.   PHYSICAL EXAMINATION:  VITAL SIGNS:  Today the patient is afebrile, his  blood pressure was 118/70, heart rate was 71, weight is 164.  GENERAL:  This is a slender gentleman who is in no acute distress.  CARDIOVASCULAR:  2+ radial pulses, precordium was quiet and he had a  regular rate and rhythm.   No further exam was  conducted.   [name]' symptoms were worrisome to me in regards to new onset  dizziness. It is not a classic presentation for orthostatic hypotension  that would occur right after changing position. Furthermore, the  severity of his dizziness requiring him to lay down also compounds my  concern. In talking with the patient, I suggested he may be a candidate  to undergo tilt table testing. I would appreciate your opinion in this  regard. He is very willing and I would be delighted to refer him to you  if an in- person evaluation would be helpful or perhaps he could be  directly scheduled for tilt-table testing.   I appreciate your consideration of this matter. Please let me know what  you would wish to do in regards to either scheduling him for a tilt-  table test or referring him for consultation.   I hope you had a wonderful holiday season with your family and have a  prosperous new year.    Sincerely,      Rosalyn Gess. Norins, MD  Electronically Signed    MEN/MedQ  DD: 05/12/2006  DT: 05/12/2006  Job #: [id number]

## 2010-10-02 NOTE — Op Note (Signed)
   NAME:  [name] [name], [name] [name] NO.:  [id number]   MEDICAL RECORD NO.:  [id number]                   PATIENT TYPE:  AMB   LOCATION:  DAY                                  FACILITY:  [facility]   PHYSICIAN:  Ollen Gross. Vernell Morgans, M.D.              DATE OF BIRTH:  04-20-1933   DATE OF PROCEDURE:  03/22/2003  DATE OF DISCHARGE:                                 OPERATIVE REPORT   PREOPERATIVE DIAGNOSIS:  Umbilical hernia.   POSTOPERATIVE DIAGNOSIS:  Umbilical hernia.   OPERATION/PROCEDURE:  Umbilical hernia repair.   SURGEON:  Ollen Gross. Carolynne Edouard, M.D.   ANESTHESIA:  General endotracheal anesthesia.   DESCRIPTION OF PROCEDURE:  After informed consent was obtained, the patient  was brought to the operating room and placed in the supine position on the  operating table.  After adequate induction of general anesthesia, the  patient's abdomen was prepped with Betadine and draped in the usual sterile  manner.  A vertically oriented incision was made through the umbilicus.  This incision was carried down through the skin and subcutaneous tissue  sharply with the electrocautery.  Blunt dissection was then carried out to  define the hernia sac.  There was only some preperitoneal fat within the  hernia.  This was all able to be reduced back below the fascia without  difficulty.  The edges of the hernia defect were very healthy and thick  fascia.  There were no other palpable defects of the anterior abdominal  wall.  The defect was then closed with interrupted 0 Prolene sutures.  The  wound was then infiltrated with 0.25% Marcaine.  The wound was then  irrigated with copious amounts of saline.  The deep subcutaneous layer was  closed with interrupted 3-0 Vicryl stitches and the skin was closed with a  running 4-0 Monocryl subcuticular stitch.  Benzoin and Steri-Strips and  sterile dressings were applied.  The patient tolerated the procedure well.  At the end of the case all needle,  sponge and instrument counts were  correct.  The patient was then awakened and taken to the recovery room in  stable condition.                                               Ollen Gross. Vernell Morgans, M.D.    PST/MEDQ  D:  03/22/2003  T:  03/22/2003  Job:  [id number]

## 2010-10-02 NOTE — Assessment & Plan Note (Signed)
[facility]                             PRIMARY CARE OFFICE NOTE   NAME:[name] [name], [name] [name]                  MRN:          [id number]  DATE:02/21/2006                            DOB:          1932/08/24    Mr. Riecke is a 77 year old gentleman followed for hypertension, IBS and BPH,  who has returned for follow-up evaluation and exam.   The patient was last seen on October 25, 2005, by Corwin Levins, MD, for viral  gastroenteritis, from which he made a good recovery.   INTERVAL HISTORY:  1. The patient has had plantar fasciitis, which has been going on for some      time.  He has seen Dr. Charlsie Merles.  He has had steroid injections x3.  He      recently had a sonic treatment.  He also has been wearing a leg brace      to relieve pressure on the tendons.  This continues to be a problem for      him.  2. Sleep difficulty.  The patient does have nocturia x3-5.  This does      disrupt his sleep pattern.  3. Decreased appetite.  The patient reports he has had a loss of interest      in food.  He has no significant symptoms.  He has had no dyspepsia.  He      has had no dysphagia or other symptoms.   PAST MEDICAL HISTORY:  Surgical:  1. Appendectomy, remote.  2. Sinus surgery, remote.   Medical:  1. The patient had the usual childhood disease.  2. Hypertension.  3. Chronic rhinitis.  4. Irritable bowel syndrome.  5. Mild anxiety.  6. BPH.   CURRENT MEDICATIONS:  1. Doxazosin 8 mg daily.  2. Aspirin 325 mg daily.  3. Calcium 600 mg daily.  4. Garlic 80 mg.  5. Flaxseed daily.  6. Metamucil daily.  7. Nasarel nasal spray p.r.n.  8. Xanax p.r.n.  9. Ambien 10 mg q.h.s. p.r.n.   FAMILY HISTORY:  Noncontributory and is well-documented in previous visits.   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  He  reports that his son, who was at the [employer] and a Teacher, adult education, is now working with him in his  business.  He has a daughter who  is in Artist as a Advice worker.  The patient reports his  marriage is very healthy and robust.   REVIEW OF SYSTEMS:  Negative for any fevers, sweats or chills.  He has had  some insomnia as noted.  Eye exam was done in the last 12 months.  No ENT,  cardiovascular or respiratory complaints.  GI:  The patient has rare  heartburn but takes no medication for this.  He has had no change in his  bowel habits.  GU:  Significant for nocturia 3-5 times.  No ED.  No  musculoskeletal complaints.  No dermatologic complaints.  The patient does  see Dr. Nicholas Lose every 6 months.  No neurologic or psychiatric issues.   CHART REVIEW:  The  patient's last colonoscopy was March 23, 2005, and was  a normal study with a single polyp which was adenomatous in nature and one  hyperplastic polyp.  He would be a candidate for follow-up in 5 years.  The  patient's latest stress Cardiolite study October 28, 2003, showed mild inferior  basal thinning but no ischemia, with an ejection fraction estimated at 59%.  The patient's last carotid Doppler study October 28, 2003, showed no RICA  stenosis, LICA with 40-59% stenosis with mild plaquing.  Last  hospitalization was in 1998.  The patient did have a hearing evaluation per  Lucky Cowboy, MD, August 11, 2004.  The patient had MRI of the brain with and  without contrast May 02, 2005, for evaluation of vertigo.  This was a  negative study with no abnormalities noted.  MRI angiogram was also  performed, which showed no abnormalities.  The patient had cervical spine  films February 01, 2003, which showed mild degenerative disease at C4-5, C5-  6, with normal alignment.   Laboratory ordered and pending includes a lipid panel, BUN and creatinine  and creatinine and PSA.  The patient will be notified by phone for these.   ASSESSMENT AND PLAN:  1. Hypertension.  The patient's blood pressure is excellently controlled      at  111/65.  He will continue on Cardura.  2. Benign prostatic hypertrophy.  Patient with increasing nocturia.      Interestingly, he has normal volumes.  This has been a recent change.      Plan:  A trial of Vesicare 5 mg daily to rule out irritable bladder.      If the patient does not get a response, we would need to consider      changing his medications to possibly Flomax versus use of Proscar or      Avodart.  If this fails, the patient might be a candidate after that      for urology consultation.  3. Gastrointestinal.  The patient is currently stable with no symptoms of      irritable bowel syndrome.  4. Health maintenance.  The patient is current and up to date as noted      with studies recently completed.   In summary, he is a very pleasant gentleman who seems to be medically stable  at this time.  He will return to see me on a p.r.n. basis, and he will be  notified by phone of his lab results.            ______________________________  Rosalyn Gess Norins, MD      MEN/MedQ  DD:  02/22/2006  DT:  02/23/2006  Job #:  [id number]

## 2010-11-02 ENCOUNTER — Telehealth: Payer: Self-pay

## 2010-11-02 NOTE — Telephone Encounter (Signed)
Patient called [facility] requesting 14yr refills on his avodart. He is requesting a call back when ready to pick up

## 2010-11-04 MED ORDER — DUTASTERIDE 0.5 MG PO CAPS: 0.5000 mg | ORAL_CAPSULE | Freq: Every day | ORAL | Status: DC

## 2011-01-11 ENCOUNTER — Telehealth: Payer: Self-pay | Admitting: *Deleted

## 2011-01-11 ENCOUNTER — Other Ambulatory Visit: Payer: Self-pay | Admitting: *Deleted

## 2011-01-11 MED ORDER — TAMSULOSIN HCL 0.4 MG PO CAPS: 0.4000 mg | ORAL_CAPSULE | Freq: Every day | ORAL | Status: DC

## 2011-01-11 NOTE — Telephone Encounter (Signed)
Patient requesting a call back about RF needed.

## 2011-01-12 NOTE — Telephone Encounter (Signed)
No answer, left vm for patient to call office w/details of RX needed

## 2011-01-22 ENCOUNTER — Ambulatory Visit (INDEPENDENT_AMBULATORY_CARE_PROVIDER_SITE_OTHER): Payer: Medicare Other | Admitting: Internal Medicine

## 2011-01-22 VITALS — BP 128/70 | HR 72 | Temp 97.0°F | Wt 160.0 lb

## 2011-01-22 DIAGNOSIS — R194 Change in bowel habit: Secondary | ICD-10-CM

## 2011-01-22 DIAGNOSIS — R198 Other specified symptoms and signs involving the digestive system and abdomen: Secondary | ICD-10-CM

## 2011-01-22 MED ORDER — CHOLESTYRAMINE 4 G PO PACK: 1.0000 | PACK | Freq: Two times a day (BID) | ORAL | Status: DC

## 2011-01-22 NOTE — Patient Instructions (Signed)
Loose stools and rare fecal incontinence without blood or signs of infection, without pain or evidence of any colon related disease. Plan - stop the metamucil and use cholestyramine 4 g packet twice a day. If this does not clear up the problem will need to consider a GI consult to evaluate colon function and absorption issues.

## 2011-01-24 NOTE — Progress Notes (Signed)
  Subjective:    Patient ID: [name] [name], male    DOB: [birth year], 77 y.o.   MRN: [id number]  HPI [name] [name] prsents due to a change in bowel habit: he has had more frequent stools that are loose in nature. He has had an episode of nocturnal incontinence. There has been no blood or mucus in the stool. He has not had any abdominal pain. No new foods and no travel. Last colonoscopy Jan 11th, '12 with removal of a sessile polyp. He has had no weight loss, night sweats or adenopathy.  I have reviewed the patient's medical history in detail and updated the computerized patient record.    Review of Systems System review is negative for any constitutional, cardiac, pulmonary, GI or neuro symptoms or complaints     Objective:   Physical Exam Vitals reviewed - normal Gen'l - WNWD white man in no distress Abdomen - BS+, soft       Assessment & Plan:  Change in bowel habit - no evidence of infectious diarrhea or IBD. Has had recent colonoscopy.  PLan - cholestyramine 4 g  BID, stop bulk laxative            For lack of improvement in symptoms will refer to GI

## 2011-02-01 ENCOUNTER — Other Ambulatory Visit: Payer: Self-pay | Admitting: Internal Medicine

## 2011-02-23 LAB — BASIC METABOLIC PANEL
BUN: 15
BUN: 11
CO2: 26
Calcium: 9.4
Chloride: 105
Creatinine, Ser: 0.97
GFR calc Af Amer: >60
GFR calc non Af Amer: >60
Glucose, Bld: 105 — ABNORMAL HIGH
Potassium: 3.9
Potassium: 4

## 2011-02-23 LAB — HEPARIN LEVEL (UNFRACTIONATED)
Heparin Unfractionated: 0.24 — ABNORMAL LOW
Heparin Unfractionated: <0.1 — ABNORMAL LOW

## 2011-02-23 LAB — LIPID PANEL
Cholesterol: 210 — ABNORMAL HIGH
Cholesterol: 221 — ABNORMAL HIGH
HDL: 49
HDL: 49
Total CHOL/HDL Ratio: 4.3
Total CHOL/HDL Ratio: 4.5
Triglycerides: 264 — ABNORMAL HIGH

## 2011-02-23 LAB — CBC
HCT: 37.6 — ABNORMAL LOW
HCT: 41.5
MCHC: 34.5
MCV: 89.8
Platelets: 237
Platelets: 241
RBC: 4.16 — ABNORMAL LOW
RBC: 4.19 — ABNORMAL LOW
RDW: 13.1
WBC: 4.8
WBC: 4.9
WBC: 4.9
WBC: 5.1

## 2011-02-23 LAB — CARDIAC PANEL(CRET KIN+CKTOT+MB+TROPI)
CK, MB: 1.7
Relative Index: INVALID
Total CK: 74
Troponin I: 0.04

## 2011-02-23 LAB — TSH: TSH: 3.128

## 2011-02-23 LAB — POCT CARDIAC MARKERS
Myoglobin, poc: 73.2
Operator id: 4531

## 2011-02-23 LAB — PROTIME-INR: Prothrombin Time: 12.3

## 2011-02-23 LAB — CK TOTAL AND CKMB (NOT AT ARMC)
CK, MB: 1.5
Relative Index: INVALID
Total CK: 57

## 2011-05-10 ENCOUNTER — Encounter: Payer: Self-pay | Admitting: Internal Medicine

## 2011-05-10 ENCOUNTER — Other Ambulatory Visit (INDEPENDENT_AMBULATORY_CARE_PROVIDER_SITE_OTHER): Payer: Medicare Other

## 2011-05-10 ENCOUNTER — Ambulatory Visit (INDEPENDENT_AMBULATORY_CARE_PROVIDER_SITE_OTHER): Payer: Medicare Other | Admitting: Internal Medicine

## 2011-05-10 DIAGNOSIS — Z86010 Personal history of colonic polyps: Secondary | ICD-10-CM

## 2011-05-10 DIAGNOSIS — Z2911 Encounter for prophylactic immunotherapy for respiratory syncytial virus (RSV): Secondary | ICD-10-CM | POA: Diagnosis not present

## 2011-05-10 DIAGNOSIS — I2510 Atherosclerotic heart disease of native coronary artery without angina pectoris: Secondary | ICD-10-CM

## 2011-05-10 DIAGNOSIS — I10 Essential (primary) hypertension: Secondary | ICD-10-CM

## 2011-05-10 DIAGNOSIS — Z23 Encounter for immunization: Secondary | ICD-10-CM

## 2011-05-10 DIAGNOSIS — Z0000 Encounter for general adult medical examination without abnormal findings: Secondary | ICD-10-CM

## 2011-05-10 DIAGNOSIS — E785 Hyperlipidemia, unspecified: Secondary | ICD-10-CM

## 2011-05-10 DIAGNOSIS — Z87898 Personal history of other specified conditions: Secondary | ICD-10-CM

## 2011-05-10 DIAGNOSIS — M542 Cervicalgia: Secondary | ICD-10-CM

## 2011-05-10 LAB — COMPREHENSIVE METABOLIC PANEL
Albumin: 3.9 g/dL (ref 3.5–5.2)
BUN: 18 mg/dL (ref 6–23)
CO2: 25 mEq/L (ref 19–32)
Calcium: 9.2 mg/dL (ref 8.4–10.5)
Chloride: 109 mEq/L (ref 96–112)
Creatinine, Ser: 0.9 mg/dL (ref 0.4–1.5)
GFR: 83.96 mL/min (ref >60.00)
Glucose, Bld: 92 mg/dL (ref 70–99)
Potassium: 4.1 mEq/L (ref 3.5–5.1)

## 2011-05-10 LAB — CBC WITH DIFFERENTIAL/PLATELET
Basophils Absolute: 0 10*3/uL (ref 0.0–0.1)
Eosinophils Relative: 2.6 % (ref 0.0–5.0)
MCV: 91.4 fl (ref 78.0–100.0)
Monocytes Absolute: 0.5 10*3/uL (ref 0.1–1.0)
Neutrophils Relative %: 59.8 % (ref 43.0–77.0)
Platelets: 174 10*3/uL (ref 150.0–400.0)
RDW: 13.5 % (ref 11.5–14.6)
WBC: 4.6 10*3/uL (ref 4.5–10.5)

## 2011-05-10 LAB — LIPID PANEL
Cholesterol: 117 mg/dL (ref 0–200)
LDL Cholesterol: 35 mg/dL (ref 0–99)
Triglycerides: 98 mg/dL (ref 0.0–149.0)

## 2011-05-10 LAB — HEPATIC FUNCTION PANEL
Alkaline Phosphatase: 52 U/L (ref 39–117)
Bilirubin, Direct: 0.1 mg/dL (ref 0.0–0.3)
Total Bilirubin: 0.7 mg/dL (ref 0.3–1.2)

## 2011-05-10 NOTE — Progress Notes (Signed)
Subjective:    Patient ID: [name] [name], male    DOB: 12-Aug-1932, 77 y.o.   MRN: [id number]  HPI The patient is here for annual Medicare wellness examination and management of other chronic and acute problems. He is doing well and feels well.    The risk factors are reflected in the social history.  The roster of all physicians providing medical care to patient - is listed in the Snapshot section of the chart.  Activities of daily living:  The patient is 100% inedpendent in all ADLs: dressing, toileting, feeding as well as independent mobility  Home safety : The patient has smoke detectors in the home. They wear seatbelts.  firearms are present in the home, kept in a safe fashion. There is no violence in the home. No fall risk and house has been made safe.   There is no risks for hepatitis, STDs or HIV. There is no   history of blood transfusion. They have no travel history to infectious disease endemic areas of the world.  The patient has seen their dentist in the last six month. They have seen their eye doctor in the last year. They deny any acute hearing difficulty, does have chronic loss of hearing in the left ear.He gets aduciology exam every six months.  They do not  have excessive sun exposure. Discussed the need for sun protection: hats, long sleeves and use of sunscreen if there is significant sun exposure.   Diet: the importance of a healthy diet is discussed. They do have a healthy diet.  The patient has a regular exercise program: walking , 30 minutes duration,  7 per week.  The benefits of regular aerobic exercise were discussed.  Depression screen: there are no signs or vegative symptoms of depression- irritability, change in appetite, anhedonia, sadness/tearfullness.  Cognitive assessment: the patient manages all their financial and personal affairs and is actively engaged.  The following portions of the patient's history were reviewed and updated as appropriate:  allergies, current medications, past family history, past medical history,  past surgical history, past social history  and problem list.  Vision, hearing, body mass index were assessed and reviewed.   During the course of the visit the patient was educated and counseled about appropriate screening and preventive services including : fall prevention , diabetes screening, nutrition counseling, colorectal cancer screening, and recommended immunizations.  Past Medical History  Diagnosis Date  . History of BPH   . IBS (irritable bowel syndrome)   . HTN (hypertension)   . Anxiety   . Rhinitis   . Hyperlipidemia   . CAD (coronary artery disease)   . Colon polyps   . Diverticular disease of colon   . Hemorrhoids     external and internal   Past Surgical History  Procedure Date  . Appendectomy 1956  . Nasal sinus surgery 1975  . Hernia repair   . Coronary stent placement 2008     Successful PCI of the lesion in the proximal LAD using a Promus    Family History  Problem Relation Age of Onset  . Coronary artery disease Father   . Heart attack Father   . Rheum arthritis Mother    History   Social History  . Marital Status: Married    Spouse Name: N/A    Number of Children: 3  . Years of Education: 16   Occupational History  . businessman, Copywriter, advertising    Social History Main Topics  . Smoking status: Never Smoker   .  Smokeless tobacco: Never Used  . Alcohol Use: 2.4 - 3.0 oz/week    4-5 Glasses of wine per week     occasional  . Drug Use: No  . Sexually Active: Yes -- Male partner(s)   Other Topics Concern  . 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 He has two daughters, 1 in [place] 1 in [place] (Dec '12) and a son who works with him. Several grandchildren.Reports that he spends a good deal of time at his home in [facility] which he  greatly enjoys and his home in the [place]. Enjoys driving his BMW convertible when in the mountains.       Review of Systems Constitutional:  Negative for fever, chills, activity change and unexpected weight change.  HEENT:  Negative for hearing loss, ear pain, congestion, neck stiffness and postnasal drip. Negative for sore throat or swallowing problems. Negative for dental complaints.   Eyes: Negative for vision loss or change in visual acuity.  Respiratory: Negative for chest tightness and wheezing. Negative for DOE.   Cardiovascular: Negative for chest pain or palpitations. No decreased exercise tolerance Gastrointestinal: No change in bowel habit. No bloating or gas. No reflux or indigestion Genitourinary: Negative for urgency, frequency, flank pain and difficulty urinating.  Musculoskeletal: Negative for myalgias, back pain, arthralgias and gait problem.  Neurological: Negative for dizziness, tremors, weakness and headaches.  Hematological: Negative for adenopathy.  Psychiatric/Behavioral: Negative for behavioral problems and dysphoric mood.       Objective:   Physical Exam Vital signs reviewed Gen'l: Well nourished well developed white male in no acute distress  HEENT: Head: Normocephalic and atraumatic. Right Ear: External ear normal. EAC/TM nl. Left Ear: External ear normal.  EAC/TM nl. Nose: Nose normal. Mouth/Throat: Oropharynx is clear and moist. Dentition - native, in good repair. No buccal or palatal lesions. Posterior pharynx clear. Eyes: Conjunctivae and sclera clear. EOM intact. Pupils are equal, round, and reactive to light. Right eye exhibits no discharge. Left eye exhibits no discharge. Neck: Normal range of motion. Neck supple. No JVD present. No tracheal deviation present. No thyromegaly present.  Cardiovascular: Normal rate, regular rhythm, no gallop, no friction rub, no murmur heard.      Quiet precordium. 2+ radial and DP pulses . No carotid  bruits Pulmonary/Chest: Effort normal. No respiratory distress or increased WOB, no wheezes, no rales. No chest wall deformity or CVAT. Abdominal: Soft. Bowel sounds are normal in all quadrants. He exhibits no distension, no tenderness, no rebound or guarding, No heptosplenomegaly  Genitourinary:  deferred Musculoskeletal: Normal range of motion. He exhibits no edema and no tenderness.       Small and large joints without redness, synovial thickening or deformity. Full range of motion preserved about all small, median and large joints.  Lymphadenopathy:    He has no cervical or supraclavicular adenopathy.  Neurological: He is alert and oriented to person, place, and time. CN II-XII intact. DTRs 2+ and symmetrical biceps, radial and patellar tendons. Cerebellar function normal with no tremor, rigidity, normal gait and station.  Skin: Skin is warm and dry. No rash noted. No erythema.  Psychiatric: He has a normal mood and affect. His behavior is normal. Thought content normal.   Lab Results  Component Value Date   WBC 4.6 05/10/2011   HGB 13.1 05/10/2011   HCT 38.1* 05/10/2011   PLT  174.0 05/10/2011   GLUCOSE 92 05/10/2011   CHOL 117 05/10/2011   TRIG 98.0 05/10/2011   HDL 62.90 05/10/2011        LDLCALC 35 05/10/2011        ALT 26 05/10/2011   AST 26 05/10/2011   AST     NA 139 05/10/2011   K 4.1 05/10/2011   CL 109 05/10/2011   CREATININE 0.9 05/10/2011   BUN 18 05/10/2011   CO2 25 05/10/2011   TSH 2.02 05/07/2010   PSA 0.44 05/10/2011              Assessment & Plan:

## 2011-05-11 ENCOUNTER — Encounter: Payer: Self-pay | Admitting: Internal Medicine

## 2011-05-11 DIAGNOSIS — Z0000 Encounter for general adult medical examination without abnormal findings: Secondary | ICD-10-CM | POA: Insufficient documentation

## 2011-05-11 NOTE — Assessment & Plan Note (Signed)
BP Readings from Last 3 Encounters:  05/10/11 112/68  01/22/11 128/70  08/04/10 118/60   Excellent control.  Plan - continue present medications

## 2011-05-11 NOTE — Assessment & Plan Note (Signed)
Doing very well - no chest pain, no limitations in activity. Risk modification is excellent. He continues to exercise, weight is well controlled, lipids are controlled. Last cardiology OV March 20, '12 - stable.  Plan- continue risk modification.

## 2011-05-11 NOTE — Assessment & Plan Note (Signed)
Doing well - nocturia x 2 and no other symptoms of prostatism. PSA remains in the low normal range.

## 2011-05-11 NOTE — Assessment & Plan Note (Signed)
No active complaint and no limitation in range of motion, no radicular symptoms.

## 2011-05-11 NOTE — Assessment & Plan Note (Signed)
Interval medical history significant for colon polypectomy otherwise normal. Physical exam is normal. Lab results are in normal range. He is current with colon cancer screening and prostate cancer screening with a normal PSA. Advised that future prostate screening is not indicated. Immunizations are up to date except he is due Tetanus booster..  In summary - a very nice man who is medically stable with a projected life-expectancy that is better than average. He will return as needed or in one year.

## 2011-05-11 NOTE — Assessment & Plan Note (Signed)
Excellent control with LDL less than 70!  Plan - continue present medication

## 2011-05-11 NOTE — Assessment & Plan Note (Signed)
Last colonoscopy May 27, 2010. S/p polypectomy - final path report = tubular adenoma.  Plan - recall per GI protocol.

## 2011-05-26 ENCOUNTER — Other Ambulatory Visit: Payer: Self-pay | Admitting: Dermatology

## 2011-05-26 DIAGNOSIS — D2360 Other benign neoplasm of skin of unspecified upper limb, including shoulder: Secondary | ICD-10-CM | POA: Diagnosis not present

## 2011-05-26 DIAGNOSIS — Z85820 Personal history of malignant melanoma of skin: Secondary | ICD-10-CM | POA: Diagnosis not present

## 2011-05-26 DIAGNOSIS — L570 Actinic keratosis: Secondary | ICD-10-CM | POA: Diagnosis not present

## 2011-05-26 DIAGNOSIS — Z85828 Personal history of other malignant neoplasm of skin: Secondary | ICD-10-CM | POA: Diagnosis not present

## 2011-05-26 DIAGNOSIS — C44621 Squamous cell carcinoma of skin of unspecified upper limb, including shoulder: Secondary | ICD-10-CM | POA: Diagnosis not present

## 2011-06-03 DIAGNOSIS — H2589 Other age-related cataract: Secondary | ICD-10-CM | POA: Diagnosis not present

## 2011-06-06 ENCOUNTER — Other Ambulatory Visit: Payer: Self-pay | Admitting: Internal Medicine

## 2011-06-14 ENCOUNTER — Other Ambulatory Visit: Payer: Self-pay | Admitting: *Deleted

## 2011-06-14 MED ORDER — LISINOPRIL 10 MG PO TABS: 10.0000 mg | ORAL_TABLET | Freq: Every day | ORAL | Status: DC

## 2011-06-14 MED ORDER — ROSUVASTATIN CALCIUM 40 MG PO TABS: 40.0000 mg | ORAL_TABLET | Freq: Every day | ORAL | Status: DC

## 2011-06-17 ENCOUNTER — Other Ambulatory Visit: Payer: Self-pay | Admitting: *Deleted

## 2011-06-17 ENCOUNTER — Other Ambulatory Visit: Payer: Self-pay | Admitting: Cardiovascular Disease

## 2011-06-17 MED ORDER — ROSUVASTATIN CALCIUM 40 MG PO TABS: 40.0000 mg | ORAL_TABLET | Freq: Every day | ORAL | Status: DC

## 2011-06-17 NOTE — Telephone Encounter (Signed)
Refill   Patient needs Crestor refill  90 day through [facility], as well as a short term supply as he is almost out of meds.   Please send to:   [facility]  [address]  [place], [place]  [phone]  Patient can be reached on mobile [phone]

## 2011-07-21 DIAGNOSIS — H00029 Hordeolum internum unspecified eye, unspecified eyelid: Secondary | ICD-10-CM | POA: Diagnosis not present

## 2011-07-29 DIAGNOSIS — H16149 Punctate keratitis, unspecified eye: Secondary | ICD-10-CM | POA: Diagnosis not present

## 2011-08-04 ENCOUNTER — Encounter: Payer: Self-pay | Admitting: Cardiovascular Disease

## 2011-08-04 ENCOUNTER — Ambulatory Visit (INDEPENDENT_AMBULATORY_CARE_PROVIDER_SITE_OTHER): Payer: Medicare Other | Admitting: Cardiovascular Disease

## 2011-08-04 VITALS — BP 101/52 | HR 74 | Resp 18 | Ht 70.0 in | Wt 159.8 lb

## 2011-08-04 DIAGNOSIS — I2510 Atherosclerotic heart disease of native coronary artery without angina pectoris: Secondary | ICD-10-CM | POA: Diagnosis not present

## 2011-08-04 NOTE — Assessment & Plan Note (Signed)
Stable with no angina and good activity level.  Continue medical Rx  

## 2011-08-04 NOTE — Patient Instructions (Signed)
Your physician wants you to follow-up in:  6 MONTHS WITH DR NISHAN  You will receive a reminder letter in the mail two months in advance. If you don't receive a letter, please call our office to schedule the follow-up appointment. Your physician recommends that you continue on your current medications as directed. Please refer to the Current Medication list given to you today. 

## 2011-08-04 NOTE — Assessment & Plan Note (Signed)
Cholesterol is at goal.  Continue current dose of statin and diet Rx.  No myalgias or side effects.  F/U  LFT's in 6 months. Lab Results  Component Value Date   LDLCALC 35 05/10/2011

## 2011-08-04 NOTE — Progress Notes (Signed)
78 yo S/P stent to LAD in 2008  Now off Plavix.    ROS: Denies fever, malais, weight loss, blurry vision, decreased visual acuity, cough, sputum, SOB, hemoptysis, pleuritic pain, palpitaitons, heartburn, abdominal pain, melena, lower extremity edema, claudication, or rash.  All other systems reviewed and negative  General: Affect appropriate Healthy:  appears stated age HEENT: normal Neck supple with no adenopathy JVP normal no bruits no thyromegaly Lungs clear with no wheezing and good diaphragmatic motion Heart:  S1/S2 no murmur, no rub, gallop or click PMI normal Abdomen: benighn, BS positve, no tenderness, no AAA no bruit.  No HSM or HJR Distal pulses intact with no bruits No edema Neuro non-focal Skin warm and dry No muscular weakness   Current Outpatient Prescriptions  Medication Sig Dispense Refill  . ALPRAZolam (XANAX) 0.25 MG tablet Take 0.25 mg by mouth. 1/2 to 1 tab prn       . aspirin 81 MG tablet Take 81 mg by mouth daily.        . calcium carbonate (OS-CAL) 600 MG TABS Take 600 mg by mouth daily.        . cholestyramine (QUESTRAN) 4 G packet Take 1 packet by mouth 2 (two) times daily.  60 each  11  . dutasteride (AVODART) 0.5 MG capsule Take 1 capsule (0.5 mg total) by mouth daily.  90 capsule  3  . Garlic-Lecthin 161-096 MG CAPS 1 tab po qd       . lisinopril (PRINIVIL,ZESTRIL) 10 MG tablet Take 1 tablet (10 mg total) by mouth daily.  90 tablet  3  . nitroGLYCERIN (NITROSTAT) 0.4 MG SL tablet Place 0.4 mg under the tongue every 5 (five) minutes as needed.        . Psyllium (MEDI-MUCIL PO) Take 240 g by mouth daily. Every morning       . rosuvastatin (CRESTOR) 40 MG tablet Take 1 tablet (40 mg total) by mouth daily.  90 tablet  3  . Tamsulosin HCl (FLOMAX) 0.4 MG CAPS TAKE 1 CAPSULE BY MOUTH ONCE DAILY  30 capsule  3  . zolpidem (AMBIEN) 10 MG tablet Take 10 mg by mouth at bedtime as needed.          Allergies  Review of patient's allergies indicates no known  allergies.  Electrocardiogram: NSR rate 78 ICRBBB  Assessment and Plan

## 2011-08-04 NOTE — Assessment & Plan Note (Signed)
Well controlled.  Continue current medications and low sodium Dash type diet.    

## 2011-08-06 DIAGNOSIS — H0019 Chalazion unspecified eye, unspecified eyelid: Secondary | ICD-10-CM | POA: Diagnosis not present

## 2011-08-06 DIAGNOSIS — H01009 Unspecified blepharitis unspecified eye, unspecified eyelid: Secondary | ICD-10-CM | POA: Diagnosis not present

## 2011-08-06 DIAGNOSIS — H00029 Hordeolum internum unspecified eye, unspecified eyelid: Secondary | ICD-10-CM | POA: Diagnosis not present

## 2011-08-18 DIAGNOSIS — H00029 Hordeolum internum unspecified eye, unspecified eyelid: Secondary | ICD-10-CM | POA: Diagnosis not present

## 2011-08-18 DIAGNOSIS — H0019 Chalazion unspecified eye, unspecified eyelid: Secondary | ICD-10-CM | POA: Diagnosis not present

## 2011-08-18 DIAGNOSIS — H01009 Unspecified blepharitis unspecified eye, unspecified eyelid: Secondary | ICD-10-CM | POA: Diagnosis not present

## 2011-08-24 ENCOUNTER — Ambulatory Visit (INDEPENDENT_AMBULATORY_CARE_PROVIDER_SITE_OTHER): Payer: Medicare Other | Admitting: Internal Medicine

## 2011-08-24 ENCOUNTER — Encounter: Payer: Self-pay | Admitting: Internal Medicine

## 2011-08-24 ENCOUNTER — Other Ambulatory Visit (INDEPENDENT_AMBULATORY_CARE_PROVIDER_SITE_OTHER): Payer: Medicare Other

## 2011-08-24 VITALS — BP 98/58 | HR 89 | Temp 98.0°F | Resp 16 | Wt 158.5 lb

## 2011-08-24 DIAGNOSIS — R109 Unspecified abdominal pain: Secondary | ICD-10-CM

## 2011-08-24 LAB — URINALYSIS, ROUTINE W REFLEX MICROSCOPIC
Leukocytes, UA: NEGATIVE
Nitrite: NEGATIVE
Specific Gravity, Urine: 1.025 (ref 1.000–1.030)
Total Protein, Urine: NEGATIVE
pH: 5.5 (ref 5.0–8.0)

## 2011-08-25 NOTE — Progress Notes (Signed)
  Subjective:    Patient ID: [name] [name], male    DOB: 01/01/1933, 78 y.o.   MRN: [id number]  HPI [name] [name] was awakened from sleep by sharp pain in the left flank that eventually radiated to the left side and abdomen. He could not get comfortable. He had nausea and a clammy feeling. The pain lasted for several hours and then resolved. He denies any fever or chills, no dysuria, no hematuria. He has remained asymptomatic.  PMH, FamHx and SocHx reviewed for any changes and relevance.    Review of Systems System review is negative for any constitutional, cardiac, pulmonary, GI or neuro symptoms or complaints other than as described in the HPI.     Objective:   Physical Exam Filed Vitals:   08/24/11 1549  BP: 98/58  Pulse: 89  Temp: 98 F (36.7 C)  Resp: 16   Gen'l- WNWD whie man in no distress Cor - RRR Pulm - normal respirations Back - no CVAT, no flank tenderness to percussion Abd - BS + x 4, no guarding or rebound  Lab: U/A negative for blood     Assessment & Plan:  Flank pain - his history is classic for nephrolithiasis with either a passed stone or small stone that became asymptomatic, favoring the former. With U/A being negative for blood would not pursue CT - kdiney stone protocol due to low probable yield  Plan - reassurance            For recurrent episode will need prompt U/A and CT

## 2011-09-03 DIAGNOSIS — D239 Other benign neoplasm of skin, unspecified: Secondary | ICD-10-CM | POA: Diagnosis not present

## 2011-09-03 DIAGNOSIS — L821 Other seborrheic keratosis: Secondary | ICD-10-CM | POA: Diagnosis not present

## 2011-09-03 DIAGNOSIS — L570 Actinic keratosis: Secondary | ICD-10-CM | POA: Diagnosis not present

## 2011-09-07 DIAGNOSIS — H0019 Chalazion unspecified eye, unspecified eyelid: Secondary | ICD-10-CM | POA: Diagnosis not present

## 2011-09-14 ENCOUNTER — Telehealth: Payer: Self-pay | Admitting: *Deleted

## 2011-09-14 MED ORDER — ALPRAZOLAM 0.25 MG PO TABS: 0.2500 mg | ORAL_TABLET | Freq: Every evening | ORAL | Status: DC | PRN

## 2011-09-14 NOTE — Telephone Encounter (Signed)
Left msg on vm stating needing a nre rx for his xanax. Haven't had med fill since 04/2010. Going on a trip to [place] and needing med to help calm him down. [facility]ing [facility] [address]... 09/14/11@10 :21am/LMB

## 2011-09-14 NOTE — Telephone Encounter (Signed)
Ok for refill on xanax. Have a great trip

## 2011-09-14 NOTE — Telephone Encounter (Signed)
Notified pt med call into [facility].... 09/14/11@3 :44pm/LMB

## 2011-09-17 ENCOUNTER — Other Ambulatory Visit: Payer: Self-pay | Admitting: Ophthalmology

## 2011-09-17 DIAGNOSIS — H0019 Chalazion unspecified eye, unspecified eyelid: Secondary | ICD-10-CM | POA: Diagnosis not present

## 2011-09-17 DIAGNOSIS — D3100 Benign neoplasm of unspecified conjunctiva: Secondary | ICD-10-CM | POA: Diagnosis not present

## 2011-10-12 DIAGNOSIS — H0019 Chalazion unspecified eye, unspecified eyelid: Secondary | ICD-10-CM | POA: Diagnosis not present

## 2011-12-14 ENCOUNTER — Telehealth: Payer: Self-pay | Admitting: Internal Medicine

## 2011-12-14 NOTE — Telephone Encounter (Signed)
Caller: Agam/Patient; PCP: Illene Regulus; CB#: [phone];  Call regarding Status of TdaP Vaccine;  Leaving for [facility] for birth of new grandbaby within next 3 weeks;  Was advised by daughter to get updated TDaP before visiting new infant.  Per Epic, no recent TDaP but electronic record has only immunizations since 2009.  Transferred to office/Sue for nurse visit appt. Info provided for caller with med question that was answered with available resources per Med Question Call Guideline.

## 2011-12-15 ENCOUNTER — Ambulatory Visit (INDEPENDENT_AMBULATORY_CARE_PROVIDER_SITE_OTHER): Payer: Medicare Other

## 2011-12-15 DIAGNOSIS — Z23 Encounter for immunization: Secondary | ICD-10-CM

## 2011-12-21 DIAGNOSIS — J343 Hypertrophy of nasal turbinates: Secondary | ICD-10-CM | POA: Diagnosis not present

## 2011-12-22 ENCOUNTER — Other Ambulatory Visit: Payer: Self-pay | Admitting: *Deleted

## 2011-12-22 MED ORDER — TAMSULOSIN HCL 0.4 MG PO CAPS: 0.4000 mg | ORAL_CAPSULE | Freq: Every day | ORAL | Status: DC

## 2012-02-24 ENCOUNTER — Ambulatory Visit (INDEPENDENT_AMBULATORY_CARE_PROVIDER_SITE_OTHER): Payer: Medicare Other | Admitting: Internal Medicine

## 2012-02-24 ENCOUNTER — Encounter: Payer: Self-pay | Admitting: Internal Medicine

## 2012-02-24 VITALS — BP 110/62 | HR 60 | Temp 98.3°F | Resp 16 | Wt 156.0 lb

## 2012-02-24 DIAGNOSIS — L570 Actinic keratosis: Secondary | ICD-10-CM | POA: Diagnosis not present

## 2012-02-24 DIAGNOSIS — N318 Other neuromuscular dysfunction of bladder: Secondary | ICD-10-CM | POA: Diagnosis not present

## 2012-02-24 DIAGNOSIS — N3281 Overactive bladder: Secondary | ICD-10-CM

## 2012-02-27 NOTE — Progress Notes (Signed)
  Subjective:    Patient ID: [name] [name], male    DOB: August 14, 1932, 78 y.o.   MRN: [id number]  HPI Mr. Votta presents for a problem with increased urinary frequency - nocturia 4-5 despite being on Proscar and Flomax for BPH. This is a progressive problem.  PMH, FamHx and SocHx reviewed for any changes and relevance.  Medications - reviewed   Review of Systems System review is negative for any constitutional, cardiac, pulmonary, GI or neuro symptoms or complaints other than as described in the HPI.     Objective:   Physical Exam Filed Vitals:   02/24/12 1548  BP: 110/62  Pulse: 60  Temp: 98.3 F (36.8 C)  Resp: 16   Gen'l- WNWD white man in no distress Cor- RRR PUlm - normal respirations Neuro - A&O x 3.       Assessment & Plan:  OAB - possible OAB despite treatment for BPH.  Plan Trial of myrbetriq 25 mg daily (#14 samples)

## 2012-03-09 DIAGNOSIS — L570 Actinic keratosis: Secondary | ICD-10-CM | POA: Diagnosis not present

## 2012-03-09 DIAGNOSIS — L821 Other seborrheic keratosis: Secondary | ICD-10-CM | POA: Diagnosis not present

## 2012-03-09 DIAGNOSIS — D1801 Hemangioma of skin and subcutaneous tissue: Secondary | ICD-10-CM | POA: Diagnosis not present

## 2012-03-09 DIAGNOSIS — D239 Other benign neoplasm of skin, unspecified: Secondary | ICD-10-CM | POA: Diagnosis not present

## 2012-03-14 DIAGNOSIS — H9319 Tinnitus, unspecified ear: Secondary | ICD-10-CM | POA: Diagnosis not present

## 2012-03-14 DIAGNOSIS — R05 Cough: Secondary | ICD-10-CM | POA: Diagnosis not present

## 2012-03-14 DIAGNOSIS — H04129 Dry eye syndrome of unspecified lacrimal gland: Secondary | ICD-10-CM | POA: Diagnosis not present

## 2012-03-14 DIAGNOSIS — H9120 Sudden idiopathic hearing loss, unspecified ear: Secondary | ICD-10-CM | POA: Diagnosis not present

## 2012-03-15 ENCOUNTER — Telehealth: Payer: Self-pay | Admitting: Internal Medicine

## 2012-03-15 NOTE — Telephone Encounter (Signed)
Forward  4 pages from [facility] to Dr. Illene Regulus for review on 03-15-12 ym

## 2012-03-23 ENCOUNTER — Telehealth: Payer: Self-pay | Admitting: Internal Medicine

## 2012-03-23 NOTE — Telephone Encounter (Signed)
K. Myrbetriq 25 mg #30, 1 po qd, refill 11

## 2012-03-23 NOTE — Telephone Encounter (Signed)
Pt had samples of Myrbetriq.  He wants an Advertising account executive for this sent to [facility] at [place].

## 2012-03-24 MED ORDER — MIRABEGRON ER 25 MG PO TB24: 25.0000 mg | ORAL_TABLET | Freq: Every day | ORAL | Status: DC

## 2012-03-24 NOTE — Telephone Encounter (Signed)
mediation called into [facility].  patient notified of this.

## 2012-03-30 ENCOUNTER — Telehealth: Payer: Self-pay | Admitting: Internal Medicine

## 2012-03-30 NOTE — Telephone Encounter (Signed)
Patient Information:  Caller Name: Andre  Phone: [phone]  Patient: Garlin, Batdorf  Gender: Male  DOB: 1933-02-08  Age: 78 Years  PCP: Illene Regulus (Adults only)   Symptoms  Reason For Call & Symptoms: neck pain/tightness x 2 weeks  Reviewed Health History In EMR: Yes  Reviewed Medications In EMR: Yes  Reviewed Allergies In EMR: Yes  Date of Onset of Symptoms: 03/16/2012  Guideline(s) Used:  Neck Pain or Stiffness  Disposition Per Guideline:   See Today in Office  Reason For Disposition Reached:   Patient wants to be seen  Advice Given:  N/A  Office Follow Up:  Does the office need to follow up with this patient?: Yes  Instructions For The Office: If can work in appt 03/30/12 please call patient; appt scheduled for 03/31/12 0800.  krs/can  RN Note:  Patient anxious.  Denies chest pain or SOB.  Appt scheduled 0800 03/31/12 with Ms. Sampson Si for See Within 24 Hours.

## 2012-03-31 ENCOUNTER — Ambulatory Visit (INDEPENDENT_AMBULATORY_CARE_PROVIDER_SITE_OTHER): Payer: Medicare Other | Admitting: Internal Medicine

## 2012-03-31 ENCOUNTER — Other Ambulatory Visit (INDEPENDENT_AMBULATORY_CARE_PROVIDER_SITE_OTHER): Payer: Medicare Other

## 2012-03-31 ENCOUNTER — Encounter: Payer: Self-pay | Admitting: Internal Medicine

## 2012-03-31 VITALS — BP 118/70 | HR 77 | Temp 97.5°F | Ht 70.0 in | Wt 155.0 lb

## 2012-03-31 DIAGNOSIS — R5383 Other fatigue: Secondary | ICD-10-CM

## 2012-03-31 DIAGNOSIS — E785 Hyperlipidemia, unspecified: Secondary | ICD-10-CM

## 2012-03-31 DIAGNOSIS — Z1329 Encounter for screening for other suspected endocrine disorder: Secondary | ICD-10-CM

## 2012-03-31 DIAGNOSIS — R070 Pain in throat: Secondary | ICD-10-CM

## 2012-03-31 DIAGNOSIS — R5381 Other malaise: Secondary | ICD-10-CM

## 2012-03-31 LAB — LIPID PANEL
Cholesterol: 123 mg/dL (ref 0–200)
LDL Cholesterol: 39 mg/dL (ref 0–99)
Triglycerides: 115 mg/dL (ref 0.0–149.0)
VLDL: 23 mg/dL (ref 0.0–40.0)

## 2012-03-31 LAB — CBC
MCHC: 33.1 g/dL (ref 30.0–36.0)
Platelets: 188 10*3/uL (ref 150.0–400.0)
RDW: 13 % (ref 11.5–14.6)
WBC: 5.4 10*3/uL (ref 4.5–10.5)

## 2012-03-31 LAB — BASIC METABOLIC PANEL
CO2: 26 mEq/L (ref 19–32)
Chloride: 110 mEq/L (ref 96–112)
Potassium: 4.2 mEq/L (ref 3.5–5.1)

## 2012-03-31 NOTE — Progress Notes (Signed)
Subjective:    Patient ID: [name] [name], male    DOB: [birth year], 78 y.o.   MRN: [id number]  HPI  Pt presents to the clinic today with c/o of throat pain. This incident occurred on 2 separate occasions yesterday. He felt as if he had a necktie on too tight. His throat would tighten up and his vision would be slightly blurred for just a second, then it would return to normal. He did not have any difficulty talking, swallowing or feeling like he was choking on his food. This sensation would last only a few minutes. He was not feeling stressed or anxious. He did not have any difficulty breathing.He has been feeling a little more tired, but he has be  nworking a lot more than usual.  He feels like this may be due to a medication he was recently started on Mirabegron, so he decided to stop taking this medication. He has not been sick or feeling ill. He has had recent travel to [place] 1 month ago. He has had no sick contacts.   Review of Systems  Past Medical History  Diagnosis Date  . History of BPH   . IBS (irritable bowel syndrome)   . HTN (hypertension)   . Anxiety   . Rhinitis   . Hyperlipidemia   . CAD (coronary artery disease)   . Colon polyps   . Diverticular disease of colon   . Hemorrhoids     external and internal    Current Outpatient Prescriptions  Medication Sig Dispense Refill  . ALPRAZolam (XANAX) 0.25 MG tablet Take 1 tablet (0.25 mg total) by mouth at bedtime as needed for sleep. 1/2 to 1 tab prn  30 tablet  0  . aspirin 81 MG tablet Take 81 mg by mouth daily.        . calcium carbonate (OS-CAL) 600 MG TABS Take 600 mg by mouth daily.        . cholestyramine (QUESTRAN) 4 G packet Take 1 packet by mouth 2 (two) times daily.  60 each  11  . dutasteride (AVODART) 0.5 MG capsule Take 1 capsule (0.5 mg total) by mouth daily.  90 capsule  3  . Garlic-Lecthin 213-086 MG CAPS 1 tab po qd       . lisinopril (PRINIVIL,ZESTRIL) 10 MG tablet Take 1 tablet (10 mg total) by mouth  daily.  90 tablet  3  . mirabegron ER (MYRBETRIQ) 25 MG TB24 Take 1 tablet (25 mg total) by mouth daily.  30 tablet  11  . nitroGLYCERIN (NITROSTAT) 0.4 MG SL tablet Place 0.4 mg under the tongue every 5 (five) minutes as needed.        . Psyllium (MEDI-MUCIL PO) Take 240 g by mouth daily. Every morning       . rosuvastatin (CRESTOR) 40 MG tablet Take 1 tablet (40 mg total) by mouth daily.  90 tablet  3  . Tamsulosin HCl (FLOMAX) 0.4 MG CAPS Take 1 capsule (0.4 mg total) by mouth daily.  90 capsule  3  . zolpidem (AMBIEN) 10 MG tablet Take 10 mg by mouth at bedtime as needed.          No Known Allergies  Family History  Problem Relation Age of Onset  . Coronary artery disease Father   . Heart attack Father   . Rheum arthritis Mother     History   Social History  . Marital Status: Married    Spouse Name: N/A    Number  of Children: 3  . Years of Education: 16   Occupational History  . businessman, Copywriter, advertising    Social History Main Topics  . Smoking status: Never Smoker   . Smokeless tobacco: Never Used  . Alcohol Use: 2.4 - 3.0 oz/week    4-5 Glasses of wine per week     Comment: occasional  . Drug Use: No  . Sexually Active: Yes -- Male partner(s)   Other Topics Concern  . 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 He has two daughters, 1 in [place] 1 in [place] (Dec '12) and a son who works with him. Several grandchildren.Reports that he spends a good deal of time at his home in [facility] which he greatly enjoys and his home in the [place]. Enjoys driving his BMW [place]ible when in the mountains.     Constitutional: Denies fever, malaise, fatigue, headache or abrupt weight changes.  HEENT: Pt reports intermittent throat tightening. Denies eye pain, eye redness, ear pain, ringing in the ears, wax buildup, runny nose, nasal  congestion, bloody nose, or sore throat. Respiratory: Denies difficulty breathing, shortness of breath, cough or sputum production.   Cardiovascular: Denies chest pain, chest tightness, palpitations or swelling in the hands or feet.  Gastrointestinal: Denies abdominal pain, bloating, constipation, diarrhea or blood in the stool.  Neurological: Denies dizziness, difficulty with memory, difficulty with speech or problems with balance and coordination.   No other specific complaints in a complete review of systems (except as listed in HPI above).     Objective:   Physical Exam   Wt Readings from Last 3 Encounters:  02/24/12 156 lb (70.761 kg)  08/24/11 158 lb 8 oz (71.895 kg)  08/04/11 159 lb 12.8 oz (72.485 kg)    General: Appears his stated age, well developed, well nourished in NAD. HEENT: Head: normal shape and size; Eyes: sclera white, no icterus, conjunctiva pink, PERRLA and EOMs intact; Ears: Tm's gray and intact, normal light reflex; Nose: mucosa pink and moist, septum midline; Throat/Mouth: Teeth present, mucosa pink and moist, no exudate, lesions or ulcerations noted.  Neck: Normal range of motion. Neck supple, trachea midline. No massses, lumps or thyromegaly present. Some mild discomfort with palpation of the thyroid. Cardiovascular: Normal rate and rhythm. S1,S2 noted.  No murmur, rubs or gallops noted. No JVD or BLE edema. No carotid bruits noted. Pulmonary/Chest: Normal effort and positive vesicular breath sounds. No respiratory distress. No wheezes, rales or ronchi noted.  Neurological: Alert and oriented. Cranial nerves II-XII intact. Coordination normal. +DTRs bilaterally.     Assessment & Plan:   Throat discomfort Pain in thyroid Fatigue Hyperlipidemia  Will obtain basic labs: CBC, BMET, lipids, TSH Based upon lab results, may consider further evaluation of thyroid disorder  RTC as need or if symptoms perist

## 2012-04-03 ENCOUNTER — Encounter: Payer: Self-pay | Admitting: Internal Medicine

## 2012-04-06 ENCOUNTER — Other Ambulatory Visit: Payer: Self-pay | Admitting: Internal Medicine

## 2012-04-10 ENCOUNTER — Ambulatory Visit (INDEPENDENT_AMBULATORY_CARE_PROVIDER_SITE_OTHER): Payer: Medicare Other | Admitting: Cardiovascular Disease

## 2012-04-10 ENCOUNTER — Encounter: Payer: Self-pay | Admitting: Cardiovascular Disease

## 2012-04-10 VITALS — BP 114/56 | HR 78 | Ht 70.0 in | Wt 157.0 lb

## 2012-04-10 DIAGNOSIS — I10 Essential (primary) hypertension: Secondary | ICD-10-CM | POA: Diagnosis not present

## 2012-04-10 DIAGNOSIS — R079 Chest pain, unspecified: Secondary | ICD-10-CM | POA: Diagnosis not present

## 2012-04-10 DIAGNOSIS — I2510 Atherosclerotic heart disease of native coronary artery without angina pectoris: Secondary | ICD-10-CM

## 2012-04-10 DIAGNOSIS — E785 Hyperlipidemia, unspecified: Secondary | ICD-10-CM

## 2012-04-10 MED ORDER — ROSUVASTATIN CALCIUM 40 MG PO TABS: 40.0000 mg | ORAL_TABLET | Freq: Every day | ORAL | Status: DC

## 2012-04-10 MED ORDER — LISINOPRIL 10 MG PO TABS: 10.0000 mg | ORAL_TABLET | Freq: Every day | ORAL | Status: DC

## 2012-04-10 NOTE — Assessment & Plan Note (Signed)
Cholesterol is at goal.  Continue current dose of statin and diet Rx.  No myalgias or side effects.  F/U  LFT's in 6 months. Lab Results  Component Value Date   LDLCALC 39 03/31/2012             

## 2012-04-10 NOTE — Patient Instructions (Addendum)
Your physician recommends that you schedule a follow-up appointment in: YEAR WITH DR [facility] Your physician recommends that you continue on your current medications as directed. Please refer to the Current Medication list given to you today.  Your physician has requested that you have an exercise tolerance test. For further information please visit [email or web address] Please also follow instruction sheet, as given. DX CHEST PAIN  WITH D RNISHAN

## 2012-04-10 NOTE — Assessment & Plan Note (Signed)
Well controlled.  Continue current medications and low sodium Dash type diet.    

## 2012-04-10 NOTE — Assessment & Plan Note (Signed)
Pain in neck,throat and chest does not sound anginal but 5 years since stent  F/U ETT

## 2012-04-10 NOTE — Progress Notes (Signed)
Patient ID: [name] [name], male   DOB: 10/19/1932, 78 y.o.   MRN: [id number] 78 yo S/P stent to LAD in 2008 Now off Plavix.  Recently seen by primary care for throat tightness and chest pain. More of his symptoms included pounding in his head and feeling that his collar was too tight.  No nitro taken.  Only two episodes last few weeks.  Urinary frequency Rx with alpha blocker by Dr Norrins with improvement and PSA ok    ROS: Denies fever, malais, weight loss, blurry vision, decreased visual acuity, cough, sputum, SOB, hemoptysis, pleuritic pain, palpitaitons, heartburn, abdominal pain, melena, lower extremity edema, claudication, or rash.  All other systems reviewed and negative  General: Affect appropriate Healthy:  appears stated age HEENT: normal Neck supple with no adenopathy JVP normal no bruits no thyromegaly Lungs clear with no wheezing and good diaphragmatic motion Heart:  S1/S2 no murmur, no rub, gallop or click PMI normal Abdomen: benighn, BS positve, no tenderness, no AAA no bruit.  No HSM or HJR Distal pulses intact with no bruits No edema Neuro non-focal Skin warm and dry No muscular weakness   Current Outpatient Prescriptions  Medication Sig Dispense Refill  . ALPRAZolam (XANAX) 0.25 MG tablet Take 1 tablet (0.25 mg total) by mouth at bedtime as needed for sleep. 1/2 to 1 tab prn  30 tablet  0  . aspirin 81 MG tablet Take 81 mg by mouth daily.        . AVODART 0.5 MG capsule TAKE 1 CAPSULE BY MOUTH DAILY  90 capsule  2  . calcium carbonate (OS-CAL) 600 MG TABS Take 600 mg by mouth daily.        . cholestyramine (QUESTRAN) 4 G packet Take 1 packet by mouth 2 (two) times daily.  60 each  11  . Garlic-Lecthin 192-650 MG CAPS 1 tab po qd       . lisinopril (PRINIVIL,ZESTRIL) 10 MG tablet Take 1 tablet (10 mg total) by mouth daily.  90 tablet  3  . mirabegron ER (MYRBETRIQ) 25 MG TB24 Take 1 tablet (25 mg total) by mouth daily.  30 tablet  11  . nitroGLYCERIN (NITROSTAT) 0.4  MG SL tablet Place 0.4 mg under the tongue every 5 (five) minutes as needed.        . Psyllium (MEDI-MUCIL PO) Take 240 g by mouth daily. Every morning       . rosuvastatin (CRESTOR) 40 MG tablet Take 1 tablet (40 mg total) by mouth daily.  90 tablet  3  . Tamsulosin HCl (FLOMAX) 0.4 MG CAPS Take 1 capsule (0.4 mg total) by mouth daily.  90 capsule  3  . zolpidem (AMBIEN) 10 MG tablet Take 10 mg by mouth at bedtime as needed.          Allergies  Review of patient's allergies indicates no known allergies.  Electrocardiogram:  08/04/11  SR rate 78 ICRBBB otherwise normal  Assessment and Plan  

## 2012-04-27 ENCOUNTER — Ambulatory Visit (INDEPENDENT_AMBULATORY_CARE_PROVIDER_SITE_OTHER): Payer: Medicare Other | Admitting: Cardiovascular Disease

## 2012-04-27 ENCOUNTER — Encounter: Payer: Self-pay | Admitting: *Deleted

## 2012-04-27 DIAGNOSIS — R9439 Abnormal result of other cardiovascular function study: Secondary | ICD-10-CM | POA: Diagnosis not present

## 2012-04-27 DIAGNOSIS — R079 Chest pain, unspecified: Secondary | ICD-10-CM | POA: Diagnosis not present

## 2012-04-27 DIAGNOSIS — Z01810 Encounter for preprocedural cardiovascular examination: Secondary | ICD-10-CM | POA: Diagnosis not present

## 2012-04-27 LAB — CBC WITH DIFFERENTIAL/PLATELET
Basophils Absolute: 0 10*3/uL (ref 0.0–0.1)
Basophils Relative: 0.3 % (ref 0.0–3.0)
Eosinophils Absolute: 0.1 10*3/uL (ref 0.0–0.7)
HCT: 41 % (ref 39.0–52.0)
Hemoglobin: 13.9 g/dL (ref 13.0–17.0)
Lymphocytes Relative: 29.6 % (ref 12.0–46.0)
Lymphs Abs: 1.7 10*3/uL (ref 0.7–4.0)
MCHC: 33.9 g/dL (ref 30.0–36.0)
MCV: 90.8 fl (ref 78.0–100.0)
Monocytes Absolute: 0.5 10*3/uL (ref 0.1–1.0)
Neutro Abs: 3.3 10*3/uL (ref 1.4–7.7)
RBC: 4.51 Mil/uL (ref 4.22–5.81)
RDW: 13.2 % (ref 11.5–14.6)

## 2012-04-27 LAB — PROTIME-INR
INR: 1 ratio (ref 0.8–1.0)
Prothrombin Time: 10.4 s (ref 10.2–12.4)

## 2012-04-27 LAB — BASIC METABOLIC PANEL
BUN: 17 mg/dL (ref 6–23)
Calcium: 9.4 mg/dL (ref 8.4–10.5)
Chloride: 106 mEq/L (ref 96–112)
Creatinine, Ser: 1.1 mg/dL (ref 0.4–1.5)
GFR: 72.01 mL/min (ref >60.00)

## 2012-04-27 MED ORDER — METOPROLOL SUCCINATE ER 25 MG PO TB24: 25.0000 mg | ORAL_TABLET | Freq: Every day | ORAL | Status: DC

## 2012-04-27 NOTE — Patient Instructions (Signed)
Your physician has requested that you have a cardiac catheterization. Cardiac catheterization is used to diagnose and/or treat various heart conditions. Doctors may recommend this procedure for a number of different reasons. The most common reason is to evaluate chest pain. Chest pain can be a symptom of coronary artery disease (CAD), and cardiac catheterization can show whether plaque is narrowing or blocking your heart's arteries. This procedure is also used to evaluate the valves, as well as measure the blood flow and oxygen levels in different parts of your heart. For further information please visit [email or web address] Please follow instruction sheet, as given. WITH DR  COOPER ON MON 05-01-12

## 2012-04-27 NOTE — Progress Notes (Signed)
Exercise Treadmill Test  Pre-Exercise Testing Evaluation Rhythm: normal sinus  Rate: 79                 Test  Exercise Tolerance Test Ordering MD: Charlton Haws, MD  Interpreting MD: Charlton Haws, MD  Unique Test No: 1  Treadmill:  1  Indication for ETT: cad  Contraindication to ETT: No   Stress Modality: exercise - treadmill  Cardiac Imaging Performed: non   Protocol: standard Bruce - maximal  Max BP:  206/72  Max MPHR (bpm):  144 85% MPR (bpm):  122  MPHR obtained (bpm):  164 % MPHR obtained:  113  Reached 85% MPHR (min:sec):  3:13 Total Exercise Time (min-sec):  6:46  Workload in METS:  11.3 Borg Scale: 15  Reason ETT Terminated:  NSVT    ST Segment Analysis At Rest: normal ST segments - no evidence of significant ST depression With Exercise: no evidence of significant ST depression  Other Information Arrhythmia:  Yes Angina during ETT:  No angina but had dyspnea and salvos of VT longest run 18 beats some of which was polymorphic Quality of ETT:  diagnostic  ETT Interpretation:  Abnormal   Comments: Test terminated for salvos of VT with longest run about 18 beats   Recommendations: Patient advised to have heart cath given known CAD and worrisome VT.  Prevoiusly EF ok  Will have cors and LV with Dr Excell Seltzer on Monday

## 2012-04-28 ENCOUNTER — Other Ambulatory Visit: Payer: Self-pay | Admitting: Cardiovascular Disease

## 2012-04-28 DIAGNOSIS — I472 Ventricular tachycardia: Secondary | ICD-10-CM

## 2012-05-01 ENCOUNTER — Encounter (HOSPITAL_BASED_OUTPATIENT_CLINIC_OR_DEPARTMENT_OTHER)
Admission: RE | Disposition: A | Discharge status: Home / Self Care | Payer: Self-pay | Source: Ambulatory Visit | Attending: Cardiovascular Disease

## 2012-05-01 ENCOUNTER — Inpatient Hospital Stay (HOSPITAL_BASED_OUTPATIENT_CLINIC_OR_DEPARTMENT_OTHER)
Admission: RE | Admit: 2012-05-01 | Discharge: 2012-05-01 | Disposition: A | Discharge status: Home / Self Care | Payer: Medicare Other | Source: Ambulatory Visit | Attending: Cardiovascular Disease | Admitting: Cardiovascular Disease

## 2012-05-01 ENCOUNTER — Encounter (HOSPITAL_COMMUNITY): Payer: Self-pay

## 2012-05-01 DIAGNOSIS — I472 Ventricular tachycardia, unspecified: Secondary | ICD-10-CM

## 2012-05-01 DIAGNOSIS — Z79899 Other long term (current) drug therapy: Secondary | ICD-10-CM | POA: Insufficient documentation

## 2012-05-01 DIAGNOSIS — I2510 Atherosclerotic heart disease of native coronary artery without angina pectoris: Secondary | ICD-10-CM | POA: Diagnosis not present

## 2012-05-01 DIAGNOSIS — I209 Angina pectoris, unspecified: Secondary | ICD-10-CM | POA: Insufficient documentation

## 2012-05-01 DIAGNOSIS — T82897A Other specified complication of cardiac prosthetic devices, implants and grafts, initial encounter: Secondary | ICD-10-CM | POA: Insufficient documentation

## 2012-05-01 DIAGNOSIS — Y831 Surgical operation with implant of artificial internal device as the cause of abnormal reaction of the patient, or of later complication, without mention of misadventure at the time of the procedure: Secondary | ICD-10-CM | POA: Insufficient documentation

## 2012-05-01 SURGERY — JV LEFT HEART CATHETERIZATION WITH CORONARY ANGIOGRAM
Anesthesia: Moderate Sedation

## 2012-05-01 MED ORDER — SODIUM CHLORIDE 0.9 % IJ SOLN: 3.0000 mL | Freq: Two times a day (BID) | INTRAMUSCULAR | Status: DC

## 2012-05-01 MED ORDER — SODIUM CHLORIDE 0.9 % IV SOLN: 250.0000 mL | INTRAVENOUS | Status: DC | PRN

## 2012-05-01 MED ORDER — ONDANSETRON HCL 4 MG/2ML IJ SOLN: 4.0000 mg | Freq: Four times a day (QID) | INTRAMUSCULAR | Status: DC | PRN

## 2012-05-01 MED ORDER — SODIUM CHLORIDE 0.9 % IV SOLN: 1.0000 mL/kg/h | INTRAVENOUS | Status: DC

## 2012-05-01 MED ORDER — ASPIRIN 81 MG PO CHEW
324.0000 mg | CHEWABLE_TABLET | ORAL | Status: AC
  Administered 2012-05-01: 324 mg via ORAL

## 2012-05-01 MED ORDER — ACETAMINOPHEN 325 MG PO TABS: 650.0000 mg | ORAL_TABLET | ORAL | Status: DC | PRN

## 2012-05-01 MED ORDER — SODIUM CHLORIDE 0.9 % IJ SOLN: 3.0000 mL | INTRAMUSCULAR | Status: DC | PRN

## 2012-05-01 NOTE — OR Nursing (Signed)
Discharge instructions reviewed and signed, pt stated understanding, ambulated in hall without difficulty, site level 0, transported to wife's car via wheelchair 

## 2012-05-01 NOTE — Interval H&P Note (Signed)
History and Physical Interval Note:  05/01/2012 7:59 AM  [name] [name]  has presented today for surgery, with the diagnosis of chest pain  The various methods of treatment have been discussed with the patient and family. After consideration of risks, benefits and other options for treatment, the patient has consented to  Procedure(s) (LRB) with comments: JV LEFT HEART CATHETERIZATION WITH CORONARY ANGIOGRAM (N/A) as a surgical intervention .  The patient's history has been reviewed, patient examined, no change in status, stable for surgery.  I have reviewed the patient's chart and labs.  Questions were answered to the patient's satisfaction.     Tonny Bollman

## 2012-05-01 NOTE — OR Nursing (Signed)
Tegaderm dressing applied, site level 0, bedrest begins at 0845 

## 2012-05-01 NOTE — H&P (View-Only) (Signed)
Patient ID: [name] [name], male   DOB: 1932/12/06, 78 y.o.   MRN: [id number] 78 yo S/P stent to LAD in 2008 Now off Plavix.  Recently seen by primary care for throat tightness and chest pain. More of his symptoms included pounding in his head and feeling that his collar was too tight.  No nitro taken.  Only two episodes last few weeks.  Urinary frequency Rx with alpha blocker by Dr Arthur Holms with improvement and PSA ok    ROS: Denies fever, malais, weight loss, blurry vision, decreased visual acuity, cough, sputum, SOB, hemoptysis, pleuritic pain, palpitaitons, heartburn, abdominal pain, melena, lower extremity edema, claudication, or rash.  All other systems reviewed and negative  General: Affect appropriate Healthy:  appears stated age HEENT: normal Neck supple with no adenopathy JVP normal no bruits no thyromegaly Lungs clear with no wheezing and good diaphragmatic motion Heart:  S1/S2 no murmur, no rub, gallop or click PMI normal Abdomen: benighn, BS positve, no tenderness, no AAA no bruit.  No HSM or HJR Distal pulses intact with no bruits No edema Neuro non-focal Skin warm and dry No muscular weakness   Current Outpatient Prescriptions  Medication Sig Dispense Refill  . ALPRAZolam (XANAX) 0.25 MG tablet Take 1 tablet (0.25 mg total) by mouth at bedtime as needed for sleep. 1/2 to 1 tab prn  30 tablet  0  . aspirin 81 MG tablet Take 81 mg by mouth daily.        . AVODART 0.5 MG capsule TAKE 1 CAPSULE BY MOUTH DAILY  90 capsule  2  . calcium carbonate (OS-CAL) 600 MG TABS Take 600 mg by mouth daily.        . cholestyramine (QUESTRAN) 4 G packet Take 1 packet by mouth 2 (two) times daily.  60 each  11  . Garlic-Lecthin 213-086 MG CAPS 1 tab po qd       . lisinopril (PRINIVIL,ZESTRIL) 10 MG tablet Take 1 tablet (10 mg total) by mouth daily.  90 tablet  3  . mirabegron ER (MYRBETRIQ) 25 MG TB24 Take 1 tablet (25 mg total) by mouth daily.  30 tablet  11  . nitroGLYCERIN (NITROSTAT) 0.4  MG SL tablet Place 0.4 mg under the tongue every 5 (five) minutes as needed.        . Psyllium (MEDI-MUCIL PO) Take 240 g by mouth daily. Every morning       . rosuvastatin (CRESTOR) 40 MG tablet Take 1 tablet (40 mg total) by mouth daily.  90 tablet  3  . Tamsulosin HCl (FLOMAX) 0.4 MG CAPS Take 1 capsule (0.4 mg total) by mouth daily.  90 capsule  3  . zolpidem (AMBIEN) 10 MG tablet Take 10 mg by mouth at bedtime as needed.          Allergies  Review of patient's allergies indicates no known allergies.  Electrocardiogram:  08/04/11  SR rate 78 ICRBBB otherwise normal  Assessment and Plan

## 2012-05-01 NOTE — CV Procedure (Signed)
   Cardiac Catheterization Procedure Note  Name: [name] [name] MRN: [id number] DOB: 1932-08-05  Procedure: Left Heart Cath, Selective Coronary Angiography, LV angiography  Indication: 78 year old gentleman with known CAD. He presented with symptoms of atypical angina. An exercise treadmill study was done and the patient had exercise-induced ventricular tachycardia. He was referred for cardiac catheterization to evaluate for ischemia induced VT.  Procedural details: The right groin was prepped, draped, and anesthetized with 1% lidocaine. Using modified Seldinger technique, a 4 French sheath was introduced into the right femoral artery. Standard Judkins catheters were used for coronary angiography and left ventriculography. Catheter exchanges were performed over a guidewire. There were no immediate procedural complications. The patient was transferred to the post catheterization recovery area for further monitoring.  Procedural Findings: Hemodynamics:  AO 86/45 with a mean of 63 LV 87/11   Coronary angiography: Coronary dominance: right  Left mainstem: The left main is patent. There is no obstructive disease present.  Left anterior descending (LAD): The LAD is patent throughout. There is a stented segment in the proximal LAD with 30% in-stent restenosis in the proximal aspect of the stent. The diagonal branches are patent. The distal LAD is small with mild diffuse disease.  Left circumflex (LCx): The left circumflex is a moderate to large caliber vessel. The proximal circumflex is patent. The mid vessel has 30% stenosis. The obtuse marginal branches are patent.  Right coronary artery (RCA): The RCA is a large, dominant vessel. There is calcification in the right cusp. The proximal and mid portions of the RCA had minor irregularity with no significant stenosis. The PDA is a large vessel with no significant obstructive disease. The distal RCA has an area of 60-75% stenosis that has  progressed since the previous study in 2010. The PLA branches are patent.  Left ventriculography: Left ventricular systolic function is normal, LVEF is estimated at 55-65%, there is no significant mitral regurgitation   Final Conclusions:   1. Moderately severe stenosis of the distal RCA 2. Continued patency of the stented segment in the proximal LAD with mild in-stent restenosis 3. Mild nonobstructive stenosis of left circumflex 4. Normal left ventricular function  Recommendations: The patient's RCA stenosis has progressed since the previous study. He has had exercise-induced VT. It is not clear to me whether this lesion is causing coronary ischemia. Will plan on FFR evaluation of the distal RCA and possible stenting if the FFR is positive. The patient will be brought back for a staged radial case with planned same-day discharge. He will be started on Plavix in anticipation of stenting.  Tonny Bollman 05/01/2012, 8:36 AM

## 2012-05-01 NOTE — OR Nursing (Signed)
Meal served 

## 2012-05-03 ENCOUNTER — Ambulatory Visit (HOSPITAL_COMMUNITY)
Admission: RE | Admit: 2012-05-03 | Discharge: 2012-05-03 | Disposition: A | Discharge status: Home / Self Care | Payer: Medicare Other | Source: Ambulatory Visit | Attending: Cardiovascular Disease | Admitting: Cardiovascular Disease

## 2012-05-03 ENCOUNTER — Encounter (HOSPITAL_COMMUNITY)
Admission: RE | Disposition: A | Discharge status: Home / Self Care | Payer: Self-pay | Source: Ambulatory Visit | Attending: Cardiovascular Disease

## 2012-05-03 DIAGNOSIS — I2510 Atherosclerotic heart disease of native coronary artery without angina pectoris: Secondary | ICD-10-CM | POA: Insufficient documentation

## 2012-05-03 HISTORY — PX: PERCUTANEOUS CORONARY STENT INTERVENTION (PCI-S): SHX5485

## 2012-05-03 LAB — BASIC METABOLIC PANEL
BUN: 17 mg/dL (ref 6–23)
CO2: 25 mEq/L (ref 19–32)
Calcium: 9.6 mg/dL (ref 8.4–10.5)
Creatinine, Ser: 1.1 mg/dL (ref 0.50–1.35)
GFR calc non Af Amer: 63 mL/min — ABNORMAL LOW (ref >90)
Glucose, Bld: 94 mg/dL (ref 70–99)
Sodium: 140 mEq/L (ref 135–145)

## 2012-05-03 LAB — POCT ACTIVATED CLOTTING TIME: Activated Clotting Time: 209 seconds

## 2012-05-03 SURGERY — PERCUTANEOUS CORONARY STENT INTERVENTION (PCI-S)
Anesthesia: LOCAL

## 2012-05-03 MED ORDER — LIDOCAINE HCL (PF) 1 % IJ SOLN
INTRAMUSCULAR | Status: AC
  Filled 2012-05-03: qty 30

## 2012-05-03 MED ORDER — SODIUM CHLORIDE 0.9 % IV SOLN
INTRAVENOUS | Status: DC
  Administered 2012-05-03: 08:00:00 via INTRAVENOUS

## 2012-05-03 MED ORDER — ONDANSETRON HCL 4 MG/2ML IJ SOLN: 4.0000 mg | Freq: Four times a day (QID) | INTRAMUSCULAR | Status: DC | PRN

## 2012-05-03 MED ORDER — SODIUM CHLORIDE 0.9 % IV SOLN: 1.0000 mL/kg/h | INTRAVENOUS | Status: DC

## 2012-05-03 MED ORDER — SODIUM CHLORIDE 0.9 % IJ SOLN: 3.0000 mL | Freq: Two times a day (BID) | INTRAMUSCULAR | Status: DC

## 2012-05-03 MED ORDER — HEPARIN SODIUM (PORCINE) 1000 UNIT/ML IJ SOLN
INTRAMUSCULAR | Status: AC
  Filled 2012-05-03: qty 1

## 2012-05-03 MED ORDER — ADENOSINE 12 MG/4ML IV SOLN
12.0000 mL | INTRAVENOUS | Status: DC
  Filled 2012-05-03: qty 12

## 2012-05-03 MED ORDER — SODIUM CHLORIDE 0.9 % IJ SOLN: 3.0000 mL | INTRAMUSCULAR | Status: DC | PRN

## 2012-05-03 MED ORDER — SODIUM CHLORIDE 0.9 % IV SOLN: 250.0000 mL | INTRAVENOUS | Status: DC | PRN

## 2012-05-03 MED ORDER — FENTANYL CITRATE 0.05 MG/ML IJ SOLN
INTRAMUSCULAR | Status: AC
  Filled 2012-05-03: qty 2

## 2012-05-03 MED ORDER — ACETAMINOPHEN 325 MG PO TABS: 650.0000 mg | ORAL_TABLET | ORAL | Status: DC | PRN

## 2012-05-03 MED ORDER — MIDAZOLAM HCL 2 MG/2ML IJ SOLN
INTRAMUSCULAR | Status: AC
  Filled 2012-05-03: qty 2

## 2012-05-03 MED ORDER — VERAPAMIL HCL 2.5 MG/ML IV SOLN
INTRAVENOUS | Status: AC
  Filled 2012-05-03: qty 2

## 2012-05-03 MED ORDER — NITROGLYCERIN 0.2 MG/ML ON CALL CATH LAB
INTRAVENOUS | Status: AC
  Filled 2012-05-03: qty 1

## 2012-05-03 MED ORDER — OXYCODONE-ACETAMINOPHEN 5-325 MG PO TABS: 1.0000 | ORAL_TABLET | ORAL | Status: DC | PRN

## 2012-05-03 MED ORDER — ASPIRIN 81 MG PO CHEW
324.0000 mg | CHEWABLE_TABLET | ORAL | Status: AC
  Administered 2012-05-03: 324 mg via ORAL
  Filled 2012-05-03: qty 4

## 2012-05-03 MED ORDER — HEPARIN (PORCINE) IN NACL 2-0.9 UNIT/ML-% IJ SOLN
INTRAMUSCULAR | Status: AC
  Filled 2012-05-03: qty 1000

## 2012-05-03 NOTE — CV Procedure (Signed)
   CARDIAC CATH NOTE  Name: [name] [name] MRN: [id number] DOB: 1932-10-25  Procedure: Pressure wire analysis of the right coronary artery  Indication: 78 year old gentleman with known CAD. He underwent recent cardiac catheterization demonstrating moderate stenosis of the distal RCA with a 60% lesion. He was initially referred for cardiac catheterization because of exercised induced ventricular tachycardia. He was brought in today for pressure wire analysis of the RCA to rule out an ischemic substrate for his ventricular tachycardia.  Procedural Details: The right wrist was prepped, draped, and anesthetized with 1% lidocaine. Using the modified Seldinger technique, a 5 Fr sheath was introduced into the radial artery. 3 mg verapamil was administered through the radial sheath. Weight-based unfractionated heparin was given for anticoagulation. Once a therapeutic ACT was achieved, a 5 [place] JR 4 guide catheter was inserted.  A prime wire coronary guidewire was used to cross the lesion.  The wire was normalized at the tip of the guide catheter prior to insertion and coronary artery. Intravenous adenosine was administered after the wire had crossed the lesion. The FFR at peak hyperemia was 0.92. The wire was removed and angiography demonstrated stability of the right coronary artery appearance. The patient tolerated the procedure well. There were no immediate procedural complications. A TR band was used for radial hemostasis. The patient was transferred to the post catheterization recovery area for further monitoring.  Conclusions: Moderate nonobstructive stenosis of the distal right coronary artery.  Recommendations: Continue medical therapy. No indication for PCI at this time.  Tonny Bollman 05/03/2012, 10:21 AM

## 2012-05-03 NOTE — Interval H&P Note (Signed)
History and Physical Interval Note:  05/03/2012 9:31 AM  Lenetta Quaker  has presented today for surgery, with the diagnosis of blockage  The various methods of treatment have been discussed with the patient and family. After consideration of risks, benefits and other options for treatment, the patient has consented to  Procedure(s) (LRB) with comments: PERCUTANEOUS CORONARY STENT INTERVENTION (PCI-S) (N/A) as a surgical intervention .  The patient's history has been reviewed, patient examined, no change in status, stable for surgery.  I have reviewed the patient's chart and labs.  Questions were answered to the patient's satisfaction.    Diagnostic cardiac catheterization showed an intermediate lesion in the distal right coronary artery. The patient had exercised induced ventricular tachycardia. He will undergo pressure wire analysis of the RCA today with possible PCI if the pressure wire is positive.  Tonny Bollman

## 2012-05-03 NOTE — H&P (View-Only) (Signed)
Patient ID: [name] [name], male   DOB: 06/20/1932, 78 y.o.   MRN: [id number] 78 yo S/P stent to LAD in 2008 Now off Plavix.  Recently seen by primary care for throat tightness and chest pain. More of his symptoms included pounding in his head and feeling that his collar was too tight.  No nitro taken.  Only two episodes last few weeks.  Urinary frequency Rx with alpha blocker by Dr Norrins with improvement and PSA ok    ROS: Denies fever, malais, weight loss, blurry vision, decreased visual acuity, cough, sputum, SOB, hemoptysis, pleuritic pain, palpitaitons, heartburn, abdominal pain, melena, lower extremity edema, claudication, or rash.  All other systems reviewed and negative  General: Affect appropriate Healthy:  appears stated age HEENT: normal Neck supple with no adenopathy JVP normal no bruits no thyromegaly Lungs clear with no wheezing and good diaphragmatic motion Heart:  S1/S2 no murmur, no rub, gallop or click PMI normal Abdomen: benighn, BS positve, no tenderness, no AAA no bruit.  No HSM or HJR Distal pulses intact with no bruits No edema Neuro non-focal Skin warm and dry No muscular weakness   Current Outpatient Prescriptions  Medication Sig Dispense Refill  . ALPRAZolam (XANAX) 0.25 MG tablet Take 1 tablet (0.25 mg total) by mouth at bedtime as needed for sleep. 1/2 to 1 tab prn  30 tablet  0  . aspirin 81 MG tablet Take 81 mg by mouth daily.        . AVODART 0.5 MG capsule TAKE 1 CAPSULE BY MOUTH DAILY  90 capsule  2  . calcium carbonate (OS-CAL) 600 MG TABS Take 600 mg by mouth daily.        . cholestyramine (QUESTRAN) 4 G packet Take 1 packet by mouth 2 (two) times daily.  60 each  11  . Garlic-Lecthin 192-650 MG CAPS 1 tab po qd       . lisinopril (PRINIVIL,ZESTRIL) 10 MG tablet Take 1 tablet (10 mg total) by mouth daily.  90 tablet  3  . mirabegron ER (MYRBETRIQ) 25 MG TB24 Take 1 tablet (25 mg total) by mouth daily.  30 tablet  11  . nitroGLYCERIN (NITROSTAT) 0.4  MG SL tablet Place 0.4 mg under the tongue every 5 (five) minutes as needed.        . Psyllium (MEDI-MUCIL PO) Take 240 g by mouth daily. Every morning       . rosuvastatin (CRESTOR) 40 MG tablet Take 1 tablet (40 mg total) by mouth daily.  90 tablet  3  . Tamsulosin HCl (FLOMAX) 0.4 MG CAPS Take 1 capsule (0.4 mg total) by mouth daily.  90 capsule  3  . zolpidem (AMBIEN) 10 MG tablet Take 10 mg by mouth at bedtime as needed.          Allergies  Review of patient's allergies indicates no known allergies.  Electrocardiogram:  08/04/11  SR rate 78 ICRBBB otherwise normal  Assessment and Plan  

## 2012-05-03 NOTE — Progress Notes (Signed)
PER DR COOPER STOP PLAVIX AND CLIENT NOTIFIED AND VOICED UNDERSTANDING

## 2012-05-22 ENCOUNTER — Encounter: Payer: Self-pay | Admitting: Physician Assistant

## 2012-05-22 ENCOUNTER — Encounter (INDEPENDENT_AMBULATORY_CARE_PROVIDER_SITE_OTHER): Payer: Medicare Other

## 2012-05-22 ENCOUNTER — Ambulatory Visit (INDEPENDENT_AMBULATORY_CARE_PROVIDER_SITE_OTHER): Payer: Medicare Other | Admitting: Physician Assistant

## 2012-05-22 VITALS — BP 122/70 | HR 60 | Ht 70.0 in | Wt 157.4 lb

## 2012-05-22 DIAGNOSIS — H539 Unspecified visual disturbance: Secondary | ICD-10-CM | POA: Diagnosis not present

## 2012-05-22 DIAGNOSIS — I472 Ventricular tachycardia: Secondary | ICD-10-CM | POA: Diagnosis not present

## 2012-05-22 DIAGNOSIS — E785 Hyperlipidemia, unspecified: Secondary | ICD-10-CM

## 2012-05-22 DIAGNOSIS — I10 Essential (primary) hypertension: Secondary | ICD-10-CM

## 2012-05-22 DIAGNOSIS — I6529 Occlusion and stenosis of unspecified carotid artery: Secondary | ICD-10-CM

## 2012-05-22 DIAGNOSIS — I2510 Atherosclerotic heart disease of native coronary artery without angina pectoris: Secondary | ICD-10-CM | POA: Diagnosis not present

## 2012-05-22 DIAGNOSIS — H53129 Transient visual loss, unspecified eye: Secondary | ICD-10-CM

## 2012-05-22 NOTE — Patient Instructions (Addendum)
Your physician recommends that you continue on your current medications as directed. Please refer to the Current Medication list given to you today.   PLEASE SCHEDULE FOR CAROTID DUPLEX TO BE DONE; DX VISUAL DISTURBANCE/CHANGES  FOLLOW UP WITH DR. Eden Emms IN APPROX 6 WEEKS  PLEASE SCHEDULE AND APPT TO SEE YOUR OPTOMETRIST ( DR. Hyacinth Meeker) ASAP

## 2012-05-22 NOTE — Progress Notes (Signed)
[address] [place], [place]  [address] Phone: [phone], Fax:  [phone]  Date:  05/22/2012   Name:  [name] [name]   DOB:  1933-02-18   MRN:  [id number]  PCP:  Illene Regulus, MD  Primary Cardiologist:  Dr. Charlton Haws  Primary Electrophysiologist:  None    History of Present Illness: [name] [name] is a 79 y.o. male who returns for follow up after recent heart cath.  He has a hx of CAD, s/p stent to the LAD in 2008, HTN, HL, IBS, anxiety.  Echo 9/11:  EF 55-60%, normal motion, grade 2 diastolic dysfunction, mild MR, mild LAE, mild RAE, PASP 33.  He saw Dr. Eden Emms in 03/2012 and noted throat pain intermittently.  He was set up for an ETT.  ETT 04/27/12: Abnormal with episodes of ventricular tachycardia-longest 18 beats. He was set up for cardiac catheterization. LHC 05/01/12:  pLAD 30% ISR, mCFX 30%, dRCA 60-75% which had progressed from previous study in 2010. Patient was loaded with Plavix and brought back 12/18 for pressure wire analysis. FFR at peak hyperemia was 0.92 which was felt to be hemodynamically insignificant. Continued medical therapy was recommended. He was placed on Toprol and his Plavix was stopped.   Patient comes in today with multiple questions about the findings on his stress test and the findings on his heart catheterization. He notes his initial symptoms that prompted the workup included a visual disturbance. He describes central visual field blurriness with normal peripheral vision that was episodic. It would last for about 20 minutes. It was associated with throat tightness. There have been no exertional symptoms. He denies chest pain, shortness of breath, syncope. He denies orthopnea, PND or edema. I explained the findings on his heart catheterization as well as indications for medical therapy versus PCI. We also discussed the rationale for beta blocker therapy as well as his low risk for sudden cardiac death given that his ejection  fraction is normal and he does not have significant, obstructive coronary artery disease.  Labs (11/13):   LDL 39, TSH 2.94 Labs (12/13):   Hgb 13.9, K 4, creatinine 1.10   Wt Readings from Last 3 Encounters:  05/22/12 157 lb 6.4 oz (71.396 kg)  05/03/12 151 lb (68.493 kg)  05/03/12 151 lb (68.493 kg)     Past Medical History  Diagnosis Date  . History of BPH   . IBS (irritable bowel syndrome)   . HTN (hypertension)   . Anxiety   . Rhinitis   . Hyperlipidemia   . CAD (coronary artery disease)     a. s/p stent to LAD 2008;  b. abnormal ETT 11/13 with VTach => LHC pLAD 30% ISR, mCFX 30%, dRCA 60-75% which had progressed from previous study in 2010 => FFR of RCA 0.92 => med Rx   . Colon polyps   . Diverticular disease of colon   . Hemorrhoids     external and internal  . Hx of echocardiogram     a. Echo 9/11:  EF 55-60%, normal motion, grade 2 diastolic dysfunction, mild MR, mild LAE, mild RAE, PASP 33.    Current Outpatient Prescriptions  Medication Sig Dispense Refill  . ALPRAZolam (XANAX) 0.25 MG tablet Take 1 tablet (0.25 mg total) by mouth at bedtime as needed for sleep. 1/2 to 1 tab prn  30 tablet  0  . aspirin 81 MG tablet Take 81 mg by mouth daily.        Marland Kitchen  AVODART 0.5 MG capsule TAKE 1 CAPSULE BY MOUTH DAILY  90 capsule  2  . calcium carbonate (OS-CAL) 600 MG TABS Take 600 mg by mouth daily.        . Garlic-Lecthin 130-865 MG CAPS Take 1 tablet by mouth daily.       Marland Kitchen lisinopril (PRINIVIL,ZESTRIL) 10 MG tablet Take 1 tablet (10 mg total) by mouth daily.  90 tablet  3  . metoprolol succinate (TOPROL-XL) 25 MG 24 hr tablet Take 1 tablet (25 mg total) by mouth daily.  30 tablet  11  . mirabegron ER (MYRBETRIQ) 25 MG TB24 Take 1 tablet (25 mg total) by mouth daily.  30 tablet  11  . nitroGLYCERIN (NITROSTAT) 0.4 MG SL tablet Place 0.4 mg under the tongue every 5 (five) minutes as needed. For shortness of breath      . Psyllium (MEDI-MUCIL PO) Take 240 g by mouth daily. Every  morning       . rosuvastatin (CRESTOR) 40 MG tablet Take 1 tablet (40 mg total) by mouth daily.  90 tablet  3  . Tamsulosin HCl (FLOMAX) 0.4 MG CAPS Take 1 capsule (0.4 mg total) by mouth daily.  90 capsule  3  . zolpidem (AMBIEN) 10 MG tablet Take 10 mg by mouth at bedtime as needed. For sleep      . clopidogrel (PLAVIX) 75 MG tablet Take 75 mg by mouth daily.        Allergies: No Known Allergies  Social History:  The patient  reports that he has never smoked. He has never used smokeless tobacco. He reports that he drinks about 2.4 - 3 ounces of alcohol per week. He reports that he does not use illicit drugs.   ROS:  Please see the history of present illness.      All other systems reviewed and negative.   PHYSICAL EXAM: VS:  BP 122/70  Pulse 60  Ht 5\' 10"  (1.778 m)  Wt 157 lb 6.4 oz (71.396 kg)  BMI 22.58 kg/m2 Well nourished, well developed, in no acute distress HEENT: normal Neck: no JVD Cardiac:  normal S1, S2; RRR; no murmur Lungs:  clear to auscultation bilaterally, no wheezing, rhonchi or rales Abd: soft, nontender, no hepatomegaly Ext: no edema; right groin without hematoma or bruit, right groin without hematoma or bruit  Skin: warm and dry Neuro:  CNs 2-12 intact, no focal abnormalities noted  EKG:  Sinus bradycardia, HR 57, normal axis, incomplete RBBB, no significant change when compared to prior tracings     ASSESSMENT AND PLAN:  1. Coronary Artery Disease:   As noted, FFR of the RCA lesion was not hemodynamically significant. Continue aspirin and beta blocker therapy. Also continue on statin. As noted, he had many questions regarding findings on cardiac catheterization and stress testing. As noted above, we had a long discussion regarding the rationale for his current treatment.  2. Ventricular Tachycardia:   As noted, we a long discussion regarding his low risk for sudden cardiac death given the fact that his cardiac catheterization demonstrated nonobstructive CAD  and he has normal LV function. Continue beta blocker.  3. Visual Disturbance:   Patient tells me that this was his initial symptom complex in conjunction with his throat discomfort. I have asked him to followup with his optometrist for a full retinal exam. I will also set him up for carotid Dopplers today to rule out significant carotid stenosis. Continue aspirin.  4. Hypertension:   Controlled.  Continue current  therapy.   5. Hyperlipidemia:   Continue Crestor.  6. Disposition:   Followup with Dr. Eden Emms in 6 weeks.  Signed, Tereso Newcomer, PA-C  1:17 PM 05/22/2012

## 2012-05-23 ENCOUNTER — Encounter: Payer: Self-pay | Admitting: Internal Medicine

## 2012-05-23 ENCOUNTER — Ambulatory Visit (INDEPENDENT_AMBULATORY_CARE_PROVIDER_SITE_OTHER): Payer: Medicare Other | Admitting: Internal Medicine

## 2012-05-23 VITALS — BP 110/60 | HR 71 | Temp 97.5°F | Resp 10 | Ht 70.0 in | Wt 157.1 lb

## 2012-05-23 DIAGNOSIS — I10 Essential (primary) hypertension: Secondary | ICD-10-CM

## 2012-05-23 DIAGNOSIS — E785 Hyperlipidemia, unspecified: Secondary | ICD-10-CM | POA: Diagnosis not present

## 2012-05-23 DIAGNOSIS — N318 Other neuromuscular dysfunction of bladder: Secondary | ICD-10-CM

## 2012-05-23 DIAGNOSIS — Z0000 Encounter for general adult medical examination without abnormal findings: Secondary | ICD-10-CM | POA: Diagnosis not present

## 2012-05-23 DIAGNOSIS — I2510 Atherosclerotic heart disease of native coronary artery without angina pectoris: Secondary | ICD-10-CM

## 2012-05-23 DIAGNOSIS — N3281 Overactive bladder: Secondary | ICD-10-CM

## 2012-05-23 DIAGNOSIS — Z87898 Personal history of other specified conditions: Secondary | ICD-10-CM

## 2012-05-23 DIAGNOSIS — K589 Irritable bowel syndrome without diarrhea: Secondary | ICD-10-CM

## 2012-05-23 NOTE — Patient Instructions (Addendum)
Everything is looking great. You do not need labs since they have been normal Nov and Dec.  Please come back as needed or in 1 year.

## 2012-05-23 NOTE — Progress Notes (Signed)
Subjective:    Patient ID: [name] [name], male    DOB: 02/18/1933, 79 y.o.   MRN: [id number]  HPI The patient is here for annual Medicare wellness examination and management of other chronic and acute problems.  Mr. Rewerts had Cardiac cath revealing RCA lesion and in-stent LAD. Had FFR which revealed absence of significant RCA obstruction thus not requiring stenting.  He has otherwise been doing well. He had a great trip to [place] and [place].   The risk factors are reflected in the social history.  The roster of all physicians providing medical care to patient - is listed in the Snapshot section of the chart.  Activities of daily living:  The patient is 100% inedpendent in all ADLs: dressing, toileting, feeding as well as independent mobility  Home safety : The patient has smoke detectors in the home. Falls - no falls. Will make the home fall safe. They wear seatbelts. firearms are present in the home, kept in a safe fashion. There is no violence in the home.   There is no risks for hepatitis, STDs or HIV. There is no  history of blood transfusion. They have no travel history to infectious disease endemic areas of the world - [place] but not tropical.  The patient has seen their dentist in the last six month. They have seen their eye doctor in the last year. They admit to hearing difficulty left ear and have had audiologic testing in the last year.    They do not  have excessive sun exposure. Discussed the need for sun protection: hats, long sleeves and use of sunscreen if there is significant sun exposure.   Diet: the importance of a healthy diet is discussed. They do have a healthy diet.  The patient has a regular exercise program: walks , 2 miles a day - 14 min/mileduration, 7 per week.  The benefits of regular aerobic exercise were discussed.  Depression screen: there are no signs or vegative symptoms of depression- irritability, change in appetite, anhedonia,  sadness/tearfullness.  Cognitive assessment: the patient manages all their financial and personal affairs and is actively engaged.   The following portions of the patient's history were reviewed and updated as appropriate: allergies, current medications, past family history, past medical history,  past surgical history, past social history  and problem list.  Past Medical History  Diagnosis Date  . History of BPH   . IBS (irritable bowel syndrome)   . HTN (hypertension)   . Anxiety   . Rhinitis   . Hyperlipidemia   . CAD (coronary artery disease)     a. s/p stent to LAD 2008;  b. abnormal ETT 11/13 with VTach => LHC pLAD 30% ISR, mCFX 30%, dRCA 60-75% which had progressed from previous study in 2010 => FFR of RCA 0.92 => med Rx   . Colon polyps   . Diverticular disease of colon   . Hemorrhoids     external and internal  . Hx of echocardiogram     a. Echo 9/11:  EF 55-60%, normal motion, grade 2 diastolic dysfunction, mild MR, mild LAE, mild RAE, PASP 33.   Past Surgical History  Procedure Date  . Appendectomy 1956  . Nasal sinus surgery 1975  . Hernia repair   . Coronary stent placement 2008     Successful PCI of the lesion in the proximal LAD using a Promus    Family History  Problem Relation Age of Onset  . Coronary artery disease Father   .  Heart attack Father   . Rheum arthritis Mother    History   Social History  . Marital Status: Married    Spouse Name: N/A    Number of Children: 3  . Years of Education: 16   Occupational History  . businessman, Copywriter, advertising    Social History Main Topics  . Smoking status: Never Smoker   . Smokeless tobacco: Never Used  . Alcohol Use: 2.4 - 3.0 oz/week    4-5 Glasses of wine per week     Comment: occasional  . Drug Use: No  . Sexually Active: Yes -- Male partner(s)   Other Topics Concern  . 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 He has two daughters, 1 in [place] 1 in [place] (Dec '12) and a son who works with him. Several grandchildren.Reports that he spends a good deal of time at his home in [facility] which he greatly enjoys and his home in the [place]. Enjoys driving his BMW convertible when in the mountains.    Current Outpatient Prescriptions on File Prior to Visit  Medication Sig Dispense Refill  . ALPRAZolam (XANAX) 0.25 MG tablet Take 1 tablet (0.25 mg total) by mouth at bedtime as needed for sleep. 1/2 to 1 tab prn  30 tablet  0  . aspirin 81 MG tablet Take 81 mg by mouth daily.        . AVODART 0.5 MG capsule TAKE 1 CAPSULE BY MOUTH DAILY  90 capsule  2  . calcium carbonate (OS-CAL) 600 MG TABS Take 600 mg by mouth daily.        . Garlic-Lecthin 161-096 MG CAPS Take 1 tablet by mouth daily.       Marland Kitchen lisinopril (PRINIVIL,ZESTRIL) 10 MG tablet Take 1 tablet (10 mg total) by mouth daily.  90 tablet  3  . metoprolol succinate (TOPROL-XL) 25 MG 24 hr tablet Take 1 tablet (25 mg total) by mouth daily.  30 tablet  11  . mirabegron ER (MYRBETRIQ) 25 MG TB24 Take 1 tablet (25 mg total) by mouth daily.  30 tablet  11  . nitroGLYCERIN (NITROSTAT) 0.4 MG SL tablet Place 0.4 mg under the tongue every 5 (five) minutes as needed. For shortness of breath      . Psyllium (MEDI-MUCIL PO) Take 240 g by mouth daily. Every morning       . rosuvastatin (CRESTOR) 40 MG tablet Take 1 tablet (40 mg total) by mouth daily.  90 tablet  3  . Tamsulosin HCl (FLOMAX) 0.4 MG CAPS Take 1 capsule (0.4 mg total) by mouth daily.  90 capsule  3  . zolpidem (AMBIEN) 10 MG tablet Take 10 mg by mouth at bedtime as needed. For sleep         Vision, hearing, body mass index were assessed and reviewed.   During the course of the visit the patient was educated and counseled about appropriate screening and preventive services including : fall prevention , diabetes screening, nutrition  counseling, colorectal cancer screening, and recommended immunizations.    Review of Systems Constitutional:  Negative for fever, chills, activity change and unexpected weight change.  HEENT:  Negative for hearing loss, ear pain, congestion, neck stiffness and postnasal drip. Negative for sore throat or swallowing problems. Negative for dental complaints.   Eyes: Negative for vision loss or change in visual acuity.  Respiratory: Negative for chest tightness and wheezing. Negative for DOE.   Cardiovascular: Negative for chest pain or palpitations. No decreased exercise tolerance Gastrointestinal: No change in bowel habit. No bloating or gas. No reflux or indigestion Genitourinary: Negative for urgency, frequency, flank pain and difficulty urinating.  Musculoskeletal: Negative for myalgias, back pain, arthralgias and gait problem.  Neurological: Negative for dizziness, tremors, weakness and headaches.  Hematological: Negative for adenopathy.  Psychiatric/Behavioral: Negative for behavioral problems and dysphoric mood.       Objective:   Physical Exam Filed Vitals:   05/23/12 1431  BP: 110/60  Pulse: 71  Temp: 97.5 F (36.4 C)  Resp: 10   Wt Readings from Last 3 Encounters:  05/23/12 157 lb 1.9 oz (71.269 kg)  05/22/12 157 lb 6.4 oz (71.396 kg)  05/03/12 151 lb (68.493 kg)   Gen'l: Well nourished well developed white male in no acute distress  HEENT: Head: Normocephalic and atraumatic. Right Ear: External ear normal. EAC/TM nl. Left Ear: External ear normal.  EAC/TM nl. Nose: Nose normal. Mouth/Throat: Oropharynx is clear and moist. Dentition - native, in good repair. No buccal or palatal lesions. Posterior pharynx clear. Eyes: Conjunctivae and sclera clear. EOM intact. Pupils are equal, round, and reactive to light. Right eye exhibits no discharge. Left eye exhibits no discharge. Neck: Normal range of motion. Neck supple. No JVD present. No tracheal deviation present. No thyromegaly  present.  Cardiovascular: Normal rate, regular rhythm, no gallop, no friction rub, no murmur heard.      Quiet precordium. 2+ radial and DP pulses . No carotid bruits Pulmonary/Chest: Effort normal. No respiratory distress or increased WOB, no wheezes, no rales. No chest wall deformity or CVAT. Abdomen: Soft. Bowel sounds are normal in all quadrants. He exhibits no distension, no tenderness, no rebound or guarding, No heptosplenomegaly  Genitourinary:  deferred Musculoskeletal: Normal range of motion. He exhibits no edema and no tenderness.       Small and large joints without redness, synovial thickening or deformity. Full range of motion preserved about all small, median and large joints.  Lymphadenopathy:    He has no cervical or supraclavicular adenopathy.  Neurological: He is alert and oriented to person, place, and time. CN II-XII intact. DTRs 2+ and symmetrical biceps, radial and patellar tendons. Cerebellar function normal with no tremor, rigidity, normal gait and station.  Skin: Skin is warm and dry. No rash noted. No erythema.  Psychiatric: He has a normal mood and affect. His behavior is normal. Thought content normal.   Lab Results  Component Value Date   WBC 5.6 04/27/2012   HGB 13.9 04/27/2012   HCT 41.0 04/27/2012   PLT 195.0 04/27/2012   GLUCOSE 94 05/03/2012   CHOL 123 03/31/2012   TRIG 115.0 03/31/2012   HDL 61.50 03/31/2012   LDLDIRECT 132.2 03/10/2007   LDLCALC 39 03/31/2012        ALT 26 05/10/2011   AST 26 05/10/2011        NA 140 05/03/2012   K 4.0 05/03/2012   CL 105 05/03/2012   CREATININE 1.10 05/03/2012   BUN 17 05/03/2012   CO2 25 05/03/2012   TSH 2.94 03/31/2012   PSA 0.44 05/10/2011   INR 1.0 04/27/2012           Assessment & Plan:

## 2012-05-24 ENCOUNTER — Telehealth: Payer: Self-pay | Admitting: *Deleted

## 2012-05-24 ENCOUNTER — Encounter: Payer: Self-pay | Admitting: Physician Assistant

## 2012-05-24 NOTE — Telephone Encounter (Signed)
Message copied by Tarri Fuller on Wed May 24, 2012  5:29 PM ------      Message from: [place], [place] T      Created: Wed May 24, 2012  2:08 PM       0-39% bilateral ICA stenosis      Repeat dopplers in 1 year      Continue ASA      Follow up with optometrist for visual changes as discussed at office visit      Tereso Newcomer, PA-C  2:07 PM 05/24/2012

## 2012-05-24 NOTE — Telephone Encounter (Signed)
wife notified about carotid doppler results and to repeat dopplers in 1 yr, make sure pt f/u w/eye dr about visual changes, wife verbalized understanding today

## 2012-05-25 DIAGNOSIS — H2589 Other age-related cataract: Secondary | ICD-10-CM | POA: Diagnosis not present

## 2012-05-25 DIAGNOSIS — G43809 Other migraine, not intractable, without status migrainosus: Secondary | ICD-10-CM | POA: Diagnosis not present

## 2012-05-26 DIAGNOSIS — N3281 Overactive bladder: Secondary | ICD-10-CM | POA: Insufficient documentation

## 2012-05-26 NOTE — Assessment & Plan Note (Signed)
Good response to myrbetriq.  Plan  Continue myrbetriq

## 2012-05-26 NOTE — Assessment & Plan Note (Signed)
Interval history with NSVT on stress test leading to cath that was negative for obstructive disease. He has otherwise been doing very well. Physical exam is normal. Labs from past months reviewed - no indication to repeat labs. He is current with colorectal cancer screening and is aged out for prostate cancer screening. Immunizations are brought up to date.  In summary - a very nice man who is medically stable. He has good control of cardiac risk factors. He will return as needed or in 1 year

## 2012-05-26 NOTE — Assessment & Plan Note (Signed)
Stable and doing well. 

## 2012-05-26 NOTE — Assessment & Plan Note (Signed)
Excellent control on present medications  Plan- continue present regimen

## 2012-05-26 NOTE — Assessment & Plan Note (Signed)
Had a problem with increase urinary frequency which did respond to Myrbetriq - establishing OAB as diagnosis

## 2012-05-26 NOTE — Assessment & Plan Note (Signed)
Recent complete evaluation for CAD with non-obstructive disease of the RCA, patent stent to LAD  Plan Continue risk factor modification.

## 2012-05-26 NOTE — Assessment & Plan Note (Signed)
BP Readings from Last 3 Encounters:  05/23/12 110/60  05/22/12 122/70  05/03/12 104/55   Great control. No change in medications.

## 2012-06-19 ENCOUNTER — Ambulatory Visit (INDEPENDENT_AMBULATORY_CARE_PROVIDER_SITE_OTHER): Payer: Medicare Other | Admitting: Cardiovascular Disease

## 2012-06-19 VITALS — BP 110/60 | HR 72 | Ht 70.0 in | Wt 158.4 lb

## 2012-06-19 DIAGNOSIS — I472 Ventricular tachycardia, unspecified: Secondary | ICD-10-CM | POA: Diagnosis not present

## 2012-06-19 DIAGNOSIS — I10 Essential (primary) hypertension: Secondary | ICD-10-CM

## 2012-06-19 DIAGNOSIS — E785 Hyperlipidemia, unspecified: Secondary | ICD-10-CM | POA: Diagnosis not present

## 2012-06-19 DIAGNOSIS — I2510 Atherosclerotic heart disease of native coronary artery without angina pectoris: Secondary | ICD-10-CM | POA: Diagnosis not present

## 2012-06-19 NOTE — Assessment & Plan Note (Signed)
Well controlled.  Continue current medications and low sodium Dash type diet.    

## 2012-06-19 NOTE — Patient Instructions (Signed)
Your physician recommends that you schedule a follow-up appointment in: 3 MONTHS WITH DR Eden Emms    09-19-12  AT 9:30 AM Your physician recommends that you continue on your current medications as directed. Please refer to the Current Medication list given to you today.

## 2012-06-19 NOTE — Assessment & Plan Note (Signed)
Cholesterol is at goal.  Continue current dose of statin and diet Rx.  No myalgias or side effects.  F/U  LFT's in 6 months. Lab Results  Component Value Date   LDLCALC 39 03/31/2012

## 2012-06-19 NOTE — Assessment & Plan Note (Signed)
CAD stabel with normal EF  Continue Toprol

## 2012-06-19 NOTE — Assessment & Plan Note (Signed)
Stable with no angina and good activity level.  Continue medical Rx  

## 2012-06-19 NOTE — Progress Notes (Signed)
Patient ID: [name] [name], male   DOB: [birth year], 79 y.o.   MRN: [id number] He has a hx of CAD, s/p stent to the LAD in 2008, HTN, HL, IBS, anxiety. Echo 9/11: EF 55-60%, normal motion, grade 2 diastolic dysfunction, mild MR, mild LAE, mild RAE, PASP 33. He saw Dr. Eden Emms in 03/2012 and noted throat pain intermittently. He was set up for an ETT. ETT 04/27/12: Abnormal with episodes of ventricular tachycardia-longest 18 beats. He was set up for cardiac catheterization. LHC 05/01/12: pLAD 30% ISR, mCFX 30%, dRCA 60-75% which had progressed from previous study in 2010. Patient was loaded with Plavix and brought back 12/18 for pressure wire analysis. FFR at peak hyperemia was 0.92 which was felt to be hemodynamically insignificant. Continued medical therapy was recommended. He was placed on Toprol and his Plavix was stopped.   Doing well since being on Toprol no palpitations chest pain or presyncope  Previous visual issues turned out to be occular migrains   ROS: Denies fever, malais, weight loss, blurry vision, decreased visual acuity, cough, sputum, SOB, hemoptysis, pleuritic pain, palpitaitons, heartburn, abdominal pain, melena, lower extremity edema, claudication, or rash.  All other systems reviewed and negative  General: Affect appropriate Healthy:  appears stated age HEENT: normal Neck supple with no adenopathy JVP normal no bruits no thyromegaly Lungs clear with no wheezing and good diaphragmatic motion Heart:  S1/S2 no murmur, no rub, gallop or click PMI normal Abdomen: benighn, BS positve, no tenderness, no AAA no bruit.  No HSM or HJR Distal pulses intact with no bruits No edema Neuro non-focal Skin warm and dry No muscular weakness   Current Outpatient Prescriptions  Medication Sig Dispense Refill  . ALPRAZolam (XANAX) 0.25 MG tablet Take 1 tablet (0.25 mg total) by mouth at bedtime as needed for sleep. 1/2 to 1 tab prn  30 tablet  0  . aspirin 81 MG tablet Take 81 mg by mouth  daily.        . AVODART 0.5 MG capsule TAKE 1 CAPSULE BY MOUTH DAILY  90 capsule  2  . calcium carbonate (OS-CAL) 600 MG TABS Take 600 mg by mouth daily.        . Garlic-Lecthin 284-132 MG CAPS Take 1 tablet by mouth daily.       Marland Kitchen lisinopril (PRINIVIL,ZESTRIL) 10 MG tablet Take 1 tablet (10 mg total) by mouth daily.  90 tablet  3  . metoprolol succinate (TOPROL-XL) 25 MG 24 hr tablet Take 1 tablet (25 mg total) by mouth daily.  30 tablet  11  . mirabegron ER (MYRBETRIQ) 25 MG TB24 Take 1 tablet (25 mg total) by mouth daily.  30 tablet  11  . nitroGLYCERIN (NITROSTAT) 0.4 MG SL tablet Place 0.4 mg under the tongue every 5 (five) minutes as needed. For shortness of breath      . Psyllium (MEDI-MUCIL PO) Take 240 g by mouth daily. Every morning       . rosuvastatin (CRESTOR) 40 MG tablet Take 1 tablet (40 mg total) by mouth daily.  90 tablet  3  . zolpidem (AMBIEN) 10 MG tablet Take 10 mg by mouth at bedtime as needed. For sleep      . Tamsulosin HCl (FLOMAX) 0.4 MG CAPS Take 1 capsule (0.4 mg total) by mouth daily.  90 capsule  3    Allergies  Review of patient's allergies indicates no known allergies.  Electrocardiogram:  05/22/12  SR normal except for ICRBBB  Assessment and Plan

## 2012-06-26 ENCOUNTER — Ambulatory Visit: Payer: Medicare Other | Admitting: Cardiovascular Disease

## 2012-07-18 ENCOUNTER — Other Ambulatory Visit: Payer: Self-pay | Admitting: Internal Medicine

## 2012-07-20 ENCOUNTER — Other Ambulatory Visit: Payer: Self-pay

## 2012-07-20 MED ORDER — ALPRAZOLAM 0.25 MG PO TABS: ORAL_TABLET | ORAL | Status: DC

## 2012-07-31 ENCOUNTER — Other Ambulatory Visit: Payer: Self-pay | Admitting: *Deleted

## 2012-07-31 MED ORDER — NITROGLYCERIN 0.4 MG SL SUBL: 0.4000 mg | SUBLINGUAL_TABLET | SUBLINGUAL | Status: DC | PRN

## 2012-08-24 ENCOUNTER — Encounter: Payer: Self-pay | Admitting: Internal Medicine

## 2012-08-24 ENCOUNTER — Ambulatory Visit (INDEPENDENT_AMBULATORY_CARE_PROVIDER_SITE_OTHER): Payer: Medicare Other | Admitting: Internal Medicine

## 2012-08-24 VITALS — BP 130/70 | HR 86 | Temp 98.5°F | Ht 70.0 in | Wt 157.0 lb

## 2012-08-24 DIAGNOSIS — I10 Essential (primary) hypertension: Secondary | ICD-10-CM

## 2012-08-24 DIAGNOSIS — I6381 Other cerebral infarction due to occlusion or stenosis of small artery: Secondary | ICD-10-CM

## 2012-08-24 DIAGNOSIS — R5381 Other malaise: Secondary | ICD-10-CM

## 2012-08-24 DIAGNOSIS — R5383 Other fatigue: Secondary | ICD-10-CM | POA: Diagnosis not present

## 2012-08-24 HISTORY — DX: Other cerebral infarction due to occlusion or stenosis of small artery: I63.81

## 2012-08-24 NOTE — Progress Notes (Signed)
Subjective:    Patient ID: [name] [name], male    DOB: 09/14/1932, 79 y.o.   MRN: [id number]  HPI  Here to f/u with acute complaint of left thumb weakkness and discomfort for 5 days, made appt and today states incidentally pain and weakness are much improved with minimal loss grip strength left.  No trauma, gout or overuse per pt, but experienced pain to thenar eminence with weakness of thumb flexion and opposition, but fortunately improved today.  Pt denies chest pain, increased sob or doe, wheezing, orthopnea, PND, increased LE swelling, palpitations, dizziness or syncope. Pt denies new neurological symptoms such as new headache, or facial or extremity weakness or numbness, recent MRI 2011 with old lacunar infarct of which he was unaware today Past Medical History  Diagnosis Date  . History of BPH   . IBS (irritable bowel syndrome)   . HTN (hypertension)   . Anxiety   . Rhinitis   . Hyperlipidemia   . CAD (coronary artery disease)     a. s/p stent to LAD 2008;  b. abnormal ETT 11/13 with VTach => LHC pLAD 30% ISR, mCFX 30%, dRCA 60-75% which had progressed from previous study in 2010 => FFR of RCA 0.92 => med Rx   . Colon polyps   . Diverticular disease of colon   . Hemorrhoids     external and internal  . Hx of echocardiogram     a. Echo 9/11:  EF 55-60%, normal motion, grade 2 diastolic dysfunction, mild MR, mild LAE, mild RAE, PASP 33.  . Carotid stenosis     dopplers 1/14:  0-39% bilateral ICA stenosis  . Stroke, lacunar 08/24/2012    Old, 2011 head mri   Past Surgical History  Procedure Laterality Date  . Appendectomy  1956  . Nasal sinus surgery  1975  . Hernia repair    . Coronary stent placement  2008     Successful PCI of the lesion in the proximal LAD using a Promus     reports that he has never smoked. He has never used smokeless tobacco. He reports that he drinks about 2.4 ounces of alcohol per week. He reports that he does not use illicit drugs. family history  includes Coronary artery disease in his father; Heart attack in his father; and Rheum arthritis in his mother. No Known Allergies Current Outpatient Prescriptions on File Prior to Visit  Medication Sig Dispense Refill  . ALPRAZolam (XANAX) 0.25 MG tablet take 1/2 to 1 tablet by mouth at bedtime if needed for sleep  30 tablet  0  . aspirin 81 MG tablet Take 81 mg by mouth daily.        . AVODART 0.5 MG capsule TAKE 1 CAPSULE BY MOUTH DAILY  90 capsule  2  . calcium carbonate (OS-CAL) 600 MG TABS Take 600 mg by mouth daily.        . Garlic-Lecthin 409-811 MG CAPS Take 1 tablet by mouth daily.       Marland Kitchen lisinopril (PRINIVIL,ZESTRIL) 10 MG tablet Take 1 tablet (10 mg total) by mouth daily.  90 tablet  3  . metoprolol succinate (TOPROL-XL) 25 MG 24 hr tablet Take 1 tablet (25 mg total) by mouth daily.  30 tablet  11  . mirabegron ER (MYRBETRIQ) 25 MG TB24 Take 1 tablet (25 mg total) by mouth daily.  30 tablet  11  . nitroGLYCERIN (NITROSTAT) 0.4 MG SL tablet Place 1 tablet (0.4 mg total) under the tongue every 5 (  five) minutes as needed. For shortness of breath  25 tablet  12  . Psyllium (MEDI-MUCIL PO) Take 240 g by mouth daily. Every morning       . rosuvastatin (CRESTOR) 40 MG tablet Take 1 tablet (40 mg total) by mouth daily.  90 tablet  3  . Tamsulosin HCl (FLOMAX) 0.4 MG CAPS Take 1 capsule (0.4 mg total) by mouth daily.  90 capsule  3  . zolpidem (AMBIEN) 10 MG tablet Take 10 mg by mouth at bedtime as needed. For sleep       No current facility-administered medications on file prior to visit.   Review of Systems All otherwise neg per pt     Objective:   Physical Exam BP 130/70  Pulse 86  Temp(Src) 98.5 F (36.9 C) (Oral)  Ht 5\' 10"  (1.778 m)  Wt 157 lb (71.215 kg)  BMI 22.53 kg/m2  SpO2 95% VS noted,  Constitutional: Pt appears well-developed and well-nourished.  HENT: Head: NCAT.  Right Ear: External ear normal.  Left Ear: External ear normal.  Eyes: Conjunctivae and EOM are  normal. Pupils are equal, round, and reactive to light.  Neck: Normal range of motion. Neck supple.  Cardiovascular: Normal rate and regular rhythm.   Pulmonary/Chest: Effort normal and breath sounds normal.  Neurological: Pt is alert. Not confused , motor 5/5 except trace left grip loss, no thenar eminence atrophy or interosseous, and thumb/5th finger opposition intact, sens/dtr intact to LUE Skin: Skin is warm. No erythema. No rash Psychiatric: Pt behavior is normal. Thought content normal.     Assessment & Plan:

## 2012-08-24 NOTE — Patient Instructions (Signed)
Please continue all other medications as before, and refills have been done if requested. OK to use tylenol 8 hr for pain (or low dose advil or alleve with prilosec) Please call or message if you feel you are not better in 1-2 wks for a referral to Hand Surgeon Thank you for enrolling in MyChart. Please follow the instructions below to securely access your online medical record. MyChart allows you to send messages to your doctor, view your test results, renew your prescriptions, schedule appointments, and more.

## 2012-08-24 NOTE — Assessment & Plan Note (Signed)
Spontaneously improved with minor weakness/grip strength loss, ? MSK vs neuritic, exam o/w benign, to cont to monitor for now for any worsening, tylenol prn,  to f/u any worsening symptoms or concerns

## 2012-08-24 NOTE — Assessment & Plan Note (Signed)
stable overall by history and exam, recent data reviewed with pt, and pt to continue medical treatment as before,  to f/u any worsening symptoms or concerns BP Readings from Last 3 Encounters:  08/24/12 130/70  06/19/12 110/60  05/23/12 110/60

## 2012-09-07 ENCOUNTER — Other Ambulatory Visit: Payer: Self-pay | Admitting: Dermatology

## 2012-09-07 ENCOUNTER — Other Ambulatory Visit: Payer: Self-pay

## 2012-09-07 DIAGNOSIS — L819 Disorder of pigmentation, unspecified: Secondary | ICD-10-CM | POA: Diagnosis not present

## 2012-09-07 DIAGNOSIS — D1801 Hemangioma of skin and subcutaneous tissue: Secondary | ICD-10-CM | POA: Diagnosis not present

## 2012-09-07 DIAGNOSIS — L821 Other seborrheic keratosis: Secondary | ICD-10-CM | POA: Diagnosis not present

## 2012-09-07 DIAGNOSIS — L570 Actinic keratosis: Secondary | ICD-10-CM | POA: Diagnosis not present

## 2012-09-07 DIAGNOSIS — Z85820 Personal history of malignant melanoma of skin: Secondary | ICD-10-CM | POA: Diagnosis not present

## 2012-09-07 DIAGNOSIS — Z85828 Personal history of other malignant neoplasm of skin: Secondary | ICD-10-CM | POA: Diagnosis not present

## 2012-09-07 DIAGNOSIS — D485 Neoplasm of uncertain behavior of skin: Secondary | ICD-10-CM | POA: Diagnosis not present

## 2012-09-07 MED ORDER — DUTASTERIDE 0.5 MG PO CAPS: 0.5000 mg | ORAL_CAPSULE | Freq: Every day | ORAL | Status: DC

## 2012-09-11 ENCOUNTER — Other Ambulatory Visit: Payer: Self-pay

## 2012-09-11 MED ORDER — DUTASTERIDE 0.5 MG PO CAPS: 0.5000 mg | ORAL_CAPSULE | Freq: Every day | ORAL | Status: DC

## 2012-09-11 NOTE — Telephone Encounter (Signed)
rx for Avodart on 09/07/12 was not electronically routed, this was done today.

## 2012-09-14 DIAGNOSIS — M6530 Trigger finger, unspecified finger: Secondary | ICD-10-CM | POA: Diagnosis not present

## 2012-09-19 ENCOUNTER — Encounter: Payer: Self-pay | Admitting: Cardiovascular Disease

## 2012-09-19 ENCOUNTER — Ambulatory Visit (INDEPENDENT_AMBULATORY_CARE_PROVIDER_SITE_OTHER): Payer: Medicare Other | Admitting: Cardiovascular Disease

## 2012-09-19 VITALS — BP 116/64 | HR 72 | Resp 18 | Ht 70.0 in | Wt 155.0 lb

## 2012-09-19 DIAGNOSIS — I2510 Atherosclerotic heart disease of native coronary artery without angina pectoris: Secondary | ICD-10-CM | POA: Diagnosis not present

## 2012-09-19 DIAGNOSIS — E785 Hyperlipidemia, unspecified: Secondary | ICD-10-CM

## 2012-09-19 DIAGNOSIS — I10 Essential (primary) hypertension: Secondary | ICD-10-CM

## 2012-09-19 NOTE — Assessment & Plan Note (Signed)
Well controlled.  Continue current medications and low sodium Dash type diet.    

## 2012-09-19 NOTE — Patient Instructions (Signed)
Your physician wants you to follow-up in:  6 MONTHS WITH DR NISHAN  You will receive a reminder letter in the mail two months in advance. If you don't receive a letter, please call our office to schedule the follow-up appointment. Your physician recommends that you continue on your current medications as directed. Please refer to the Current Medication list given to you today. 

## 2012-09-19 NOTE — Assessment & Plan Note (Signed)
Cholesterol is at goal.  Continue current dose of statin and diet Rx.  No myalgias or side effects.  F/U  LFT's in 6 months. Lab Results  Component Value Date   LDLCALC 39 03/31/2012

## 2012-09-19 NOTE — Progress Notes (Signed)
Patient ID: [name] [name], male   DOB: [birth year], 79 y.o.   MRN: [id number] He has a hx of CAD, s/p stent to the LAD in 2008, HTN, HL, IBS, anxiety. Echo 9/11: EF 55-60%, normal motion, grade 2 diastolic dysfunction, mild MR, mild LAE, mild RAE, PASP 33. He saw Dr. Eden Emms in 03/2012 and noted throat pain intermittently. He was set up for an ETT. ETT 04/27/12: Abnormal with episodes of ventricular tachycardia-longest 18 beats. He was set up for cardiac catheterization. LHC 05/01/12: pLAD 30% ISR, mCFX 30%, dRCA 60-75% which had progressed from previous study in 2010. Patient was loaded with Plavix and brought back 12/18 for pressure wire analysis. FFR at peak hyperemia was 0.92 which was felt to be hemodynamically insignificant. Continued medical therapy was recommended. He was placed on Toprol and his Plavix was stopped.  Doing well since being on Toprol no palpitations chest pain or presyncope Previous visual issues turned out to be occular migrains  ROS: Denies fever, malais, weight loss, blurry vision, decreased visual acuity, cough, sputum, SOB, hemoptysis, pleuritic pain, palpitaitons, heartburn, abdominal pain, melena, lower extremity edema, claudication, or rash.  All other systems reviewed and negative  General: Affect appropriate Healthy:  appears stated age HEENT: normal Neck supple with no adenopathy JVP normal no bruits no thyromegaly Lungs clear with no wheezing and good diaphragmatic motion Heart:  S1/S2 no murmur, no rub, gallop or click PMI normal Abdomen: benighn, BS positve, no tenderness, no AAA no bruit.  No HSM or HJR Distal pulses intact with no bruits No edema Neuro non-focal Skin warm and dry No muscular weakness   Current Outpatient Prescriptions  Medication Sig Dispense Refill  . ALPRAZolam (XANAX) 0.25 MG tablet take 1/2 to 1 tablet by mouth at bedtime if needed for sleep  30 tablet  0  . aspirin 81 MG tablet Take 81 mg by mouth daily.        . calcium  carbonate (OS-CAL) 600 MG TABS Take 600 mg by mouth daily.        Marland Kitchen dutasteride (AVODART) 0.5 MG capsule Take 1 capsule (0.5 mg total) by mouth daily.  90 capsule  0  . Garlic-Lecthin 952-841 MG CAPS Take 1 tablet by mouth daily.       Marland Kitchen lisinopril (PRINIVIL,ZESTRIL) 10 MG tablet Take 1 tablet (10 mg total) by mouth daily.  90 tablet  3  . metoprolol succinate (TOPROL-XL) 25 MG 24 hr tablet Take 1 tablet (25 mg total) by mouth daily.  30 tablet  11  . mirabegron ER (MYRBETRIQ) 25 MG TB24 Take 1 tablet (25 mg total) by mouth daily.  30 tablet  11  . nitroGLYCERIN (NITROSTAT) 0.4 MG SL tablet Place 1 tablet (0.4 mg total) under the tongue every 5 (five) minutes as needed. For shortness of breath  25 tablet  12  . Psyllium (MEDI-MUCIL PO) Take 240 g by mouth daily. Every morning       . rosuvastatin (CRESTOR) 40 MG tablet Take 1 tablet (40 mg total) by mouth daily.  90 tablet  3  . Tamsulosin HCl (FLOMAX) 0.4 MG CAPS Take 1 capsule (0.4 mg total) by mouth daily.  90 capsule  3  . zolpidem (AMBIEN) 10 MG tablet Take 10 mg by mouth at bedtime as needed. For sleep       No current facility-administered medications for this visit.    Allergies  Review of patient's allergies indicates no known allergies.  Electrocardiogram:  Assessment and Plan

## 2012-09-19 NOTE — Assessment & Plan Note (Signed)
Stable with no angina and good activity level.  Continue medical Rx  

## 2012-09-20 ENCOUNTER — Other Ambulatory Visit: Payer: Self-pay | Admitting: *Deleted

## 2012-09-20 MED ORDER — METOPROLOL SUCCINATE ER 25 MG PO TB24: 25.0000 mg | ORAL_TABLET | Freq: Every day | ORAL | Status: DC

## 2012-09-26 DIAGNOSIS — H5210 Myopia, unspecified eye: Secondary | ICD-10-CM | POA: Diagnosis not present

## 2012-09-26 DIAGNOSIS — H5000 Unspecified esotropia: Secondary | ICD-10-CM | POA: Diagnosis not present

## 2012-10-04 ENCOUNTER — Telehealth: Payer: Self-pay

## 2012-10-04 NOTE — Telephone Encounter (Signed)
Prior authorization form filled out and waiting on MD signature

## 2012-10-04 NOTE — Telephone Encounter (Signed)
Phone call from patients wife stating a prior authorization is required for Avodart. She states he's been on this medication for many years. Ref # [id number]; [facility] [phone].

## 2012-10-19 ENCOUNTER — Telehealth: Payer: Self-pay | Admitting: Internal Medicine

## 2012-10-19 NOTE — Telephone Encounter (Signed)
Caller: Can/Patient; Phone: [phone]; Reason for Call: Patient calling about Avodart Rx.  States using [facility] for years, but received letter saying Avodart was not covered.  Will only cover finasteride, but Avodart works well for them.  Would like "MD to fight for me on this one and get it straightened out.  " Per Epic, PA completed 10/04/12, but [facility] did not approve.  States would like samples if possible until this can be resolved.  Info to office for provider review/letter of necessity/callback.  May reach patient at [phone].  Krs/can

## 2012-10-19 NOTE — Telephone Encounter (Signed)
I did the PA for Avodart but it was denied. (see other message)

## 2012-10-19 NOTE — Telephone Encounter (Signed)
Pt aware and would like a prescription for [facility] sent to [facility].

## 2012-10-19 NOTE — Telephone Encounter (Signed)
Call patient - we did what we could. Fenasteride does work very well and can replace avodart.

## 2012-10-20 MED ORDER — FINASTERIDE 5 MG PO TABS: 5.0000 mg | ORAL_TABLET | Freq: Every day | ORAL | Status: DC

## 2012-10-20 NOTE — Telephone Encounter (Signed)
Rx done. 

## 2012-10-20 NOTE — Telephone Encounter (Signed)
Pt notified by voicemail.

## 2012-12-28 DIAGNOSIS — M722 Plantar fascial fibromatosis: Secondary | ICD-10-CM | POA: Diagnosis not present

## 2013-01-04 ENCOUNTER — Telehealth: Payer: Self-pay | Admitting: Internal Medicine

## 2013-01-04 MED ORDER — TAMSULOSIN HCL 0.4 MG PO CAPS: 0.4000 mg | ORAL_CAPSULE | Freq: Every day | ORAL | Status: DC

## 2013-01-04 NOTE — Telephone Encounter (Signed)
Prescription has been sent to [facility] and [facility]

## 2013-01-04 NOTE — Telephone Encounter (Signed)
Patient is requesting a refill on Tamsulosin, he would like a 30 day supply sent to [facility] [phone], and a year supply sent to [facility]

## 2013-01-09 ENCOUNTER — Ambulatory Visit (INDEPENDENT_AMBULATORY_CARE_PROVIDER_SITE_OTHER): Payer: Medicare Other | Admitting: Internal Medicine

## 2013-01-09 ENCOUNTER — Encounter: Payer: Self-pay | Admitting: Internal Medicine

## 2013-01-09 ENCOUNTER — Encounter: Payer: Self-pay | Admitting: Family Medicine

## 2013-01-09 ENCOUNTER — Ambulatory Visit (INDEPENDENT_AMBULATORY_CARE_PROVIDER_SITE_OTHER): Payer: Medicare Other | Admitting: Family Medicine

## 2013-01-09 VITALS — BP 128/70 | HR 61 | Temp 97.6°F | Wt 155.0 lb

## 2013-01-09 VITALS — BP 128/70 | HR 61 | Wt 155.0 lb

## 2013-01-09 DIAGNOSIS — M7918 Myalgia, other site: Secondary | ICD-10-CM

## 2013-01-09 DIAGNOSIS — G5702 Lesion of sciatic nerve, left lower limb: Secondary | ICD-10-CM

## 2013-01-09 DIAGNOSIS — G5700 Lesion of sciatic nerve, unspecified lower limb: Secondary | ICD-10-CM | POA: Diagnosis not present

## 2013-01-09 DIAGNOSIS — IMO0001 Reserved for inherently not codable concepts without codable children: Secondary | ICD-10-CM | POA: Diagnosis not present

## 2013-01-09 NOTE — Progress Notes (Signed)
Subjective:    Patient ID: [name] [name], male    DOB: 1933-03-30, 79 y.o.   MRN: [id number]  HPI Mr. Stefanko presents with a several day h/o pain in the left buttock. It hurts to walk, to sit and to turn. No radiation of pain down the leg, no paresthesia or weakness. No hip pain. No h/o injury or overuse.  Past Medical History  Diagnosis Date  . History of BPH   . IBS (irritable bowel syndrome)   . HTN (hypertension)   . Anxiety   . Rhinitis   . Hyperlipidemia   . CAD (coronary artery disease)     a. s/p stent to LAD 2008;  b. abnormal ETT 11/13 with VTach => LHC pLAD 30% ISR, mCFX 30%, dRCA 60-75% which had progressed from previous study in 2010 => FFR of RCA 0.92 => med Rx   . Colon polyps   . Diverticular disease of colon   . Hemorrhoids     external and internal  . Hx of echocardiogram     a. Echo 9/11:  EF 55-60%, normal motion, grade 2 diastolic dysfunction, mild MR, mild LAE, mild RAE, PASP 33.  . Carotid stenosis     dopplers 1/14:  0-39% bilateral ICA stenosis  . Stroke, lacunar 08/24/2012    Old, 2011 head mri   Past Surgical History  Procedure Laterality Date  . Appendectomy  1956  . Nasal sinus surgery  1975  . Hernia repair    . Coronary stent placement  2008     Successful PCI of the lesion in the proximal LAD using a Promus    Family History  Problem Relation Age of Onset  . Coronary artery disease Father   . Heart attack Father   . Rheum arthritis Mother    History   Social History  . Marital Status: Married    Spouse Name: N/A    Number of Children: 3  . Years of Education: 16   Occupational History  . businessman, Copywriter, advertising    Social History Main Topics  . Smoking status: Never Smoker   . Smokeless tobacco: Never Used  . Alcohol Use: 2.4 - 3 oz/week    4-5 Glasses of wine per week     Comment: occasional  . Drug Use: No  . Sexual Activity: Yes    Partners: Female   Other Topics Concern  . 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 He has two daughters, 1 in [place] 1 in [place] (Dec '12) and a son who works with him. Several grandchildren.Reports that he spends a good deal of time at his home in [facility] [place] [facility] which he greatly enjoys and his home in the [place]. Enjoys driving his BMW [place] when [place].      ACP - Yes CPR, yes for short-term mechanical ventilation for reversible disease. Recommended [email or web address] for consideration.    Current Outpatient Prescriptions on File Prior to Visit  Medication Sig Dispense Refill  . ALPRAZolam (XANAX) 0.25 MG tablet take 1/2 to 1 tablet by mouth at bedtime if needed for sleep  30 tablet  0  . aspirin 81 MG tablet Take 81 mg by mouth daily.        . calcium carbonate (OS-CAL) 600 MG TABS Take 600 mg by mouth daily.        Marland Kitchen  dutasteride (AVODART) 0.5 MG capsule Take 1 capsule (0.5 mg total) by mouth daily.  90 capsule  0  . finasteride (PROSCAR) 5 MG tablet Take 1 tablet (5 mg total) by mouth daily.  90 tablet  3  . lisinopril (PRINIVIL,ZESTRIL) 10 MG tablet Take 1 tablet (10 mg total) by mouth daily.  90 tablet  3  . metoprolol succinate (TOPROL-XL) 25 MG 24 hr tablet Take 1 tablet (25 mg total) by mouth daily.  90 tablet  4  . Psyllium (MEDI-MUCIL PO) Take 240 g by mouth daily. Every morning       . rosuvastatin (CRESTOR) 40 MG tablet Take 1 tablet (40 mg total) by mouth daily.  90 tablet  3  . tamsulosin (FLOMAX) 0.4 MG CAPS capsule Take 1 capsule (0.4 mg total) by mouth daily.  90 capsule  3  . zolpidem (AMBIEN) 10 MG tablet Take 10 mg by mouth at bedtime as needed. For sleep      . nitroGLYCERIN (NITROSTAT) 0.4 MG SL tablet Place 1 tablet (0.4 mg total) under the tongue every 5 (five) minutes as needed. For shortness of breath  25 tablet  12   No current facility-administered medications on file prior to visit.       Review of Systems System review is negative for any constitutional, cardiac, pulmonary, GI or neuro symptoms or complaints other than as described in the HPI.     Objective:   Physical Exam Filed Vitals:   01/09/13 1537  BP: 128/70  Pulse: 61  Temp: 97.6 F (36.4 C)  Gen'l- WNWD white man in no distress Cor- RRR BAck - Back exam: normal stand; normal flex to greater than 100 degrees; normal gait; normal toe/heel walk; normal step up to exam table; normal SLR sitting; normal DTRs at the patellar tendons;  sensation to ; no  CVA tenderness; able to move supine to sitting witout assistance. Normal internal and external rotation of the hips.         Assessment & Plan:  Buttock pain, left - suspect muscle injury with negative back and hip exam  Plan - immediate referral to Dr. Katrinka Blazing for sports medicine consult.

## 2013-01-09 NOTE — Patient Instructions (Addendum)
Pain in the left buttock - no evidence of back or hip pathology.  Plan - immediate referral to Dr. Antoine Primas for sports medicine consult.

## 2013-01-09 NOTE — Patient Instructions (Signed)
Great to meet you You have piriformis syndrome.  You will sit in your car with tennis ball in back pocket.  Can do that in front of computer as well.  Ibuprofen as needed Icing could be help Do exercises at least most days of the week Come back in 2-3 weeks if not completely better.

## 2013-01-09 NOTE — Assessment & Plan Note (Signed)
Piriformis Syndrome  Using an anatomical model, reviewed with the patient the structures involved and how they related to diagnosis. The patient indicated understanding.   The patient was given a handout from Dr. Ailene Ards book "The Sports Medicine Patient Advisor" describing the anatomy and rehabilitation of the following condition: Piriformis Syndrome  Also given a handout with more extensive Piriformis stretching, hip flexor and abductor strengthening, ham stretching  Rec deep massage w, explained self-massage with ball  Icing and anti-inflammatories as needed  Followup in 2-3 weeks if not better.

## 2013-01-09 NOTE — Progress Notes (Signed)
   CC: Left buttock pain  HPI: Patient is a very pleasant 79 year old male coming in with left buttocks pain. Patient has had this pain for approximately 3 weeks. Patient does walk a regular basis and has noticed that when he is going uphill or downhill he seems to have more discomfort. If he is walking in a straight line he does not have any pain. Patient describes the pain as more of a dull aching sensation. Patient denies any changing shoes recently, denies any injury, denies any falls. Patient denies any radiation of pain, any numbness or weakness of the lower extremity. Patient also denies any fevers chills or any abnormal weight loss. Patient has tried ibuprofen which has been minimally beneficial. Patient was the severity of 2-3/10. Patient's toe favorite pastime is walking and he would like to not discontinue if possible. Patient denies any nighttime awakening  Past medical, surgical, family and social history reviewed. Medications reviewed all in the electronic medical record.   Review of Systems: No headache, visual changes, nausea, vomiting, diarrhea, constipation, dizziness, abdominal pain, skin rash, fevers, chills, night sweats, weight loss, swollen lymph nodes, body aches, joint swelling, muscle aches, chest pain, shortness of breath, mood changes.   Objective:    Blood pressure 128/70, pulse 61, weight 155 lb (70.308 kg).   General: No apparent distress alert and oriented x3 mood and affect normal, dressed appropriately.  HEENT: Pupils equal, extraocular movements intact Respiratory: Patient's speak in full sentences and does not appear short of breath Cardiovascular: No lower extremity edema, non tender, no erythema Skin: Warm dry intact with no signs of infection or rash on extremities or on axial skeleton. Abdomen: Soft nontender Neuro: Cranial nerves II through XII are intact, neurovascularly intact in all extremities with 2+ DTRs and 2+ pulses. Lymph: No lymphadenopathy of  posterior or anterior cervical chain or axillae bilaterally.  Gait normal with good balance and coordination.  MSK: Non tender with full range of motion and good stability and symmetric strength and tone of shoulders, elbows, wrist, hip, knee and ankles bilaterally.  Hip: Left ROM IR: 35 Deg, ER: 45 Deg, Flexion: 120 Deg, Extension: 100 Deg, Abduction: 45 Deg, Adduction: 45 Deg Strength IR: 5/5, ER: 5/5, Flexion: 5/5, Extension: 5/5, Abduction: 5/5, Adduction: 5/5 Pelvic alignment unremarkable to inspection and palpation. Standing hip rotation and gait without trendelenburg sign / unsteadiness. Greater trochanter without tenderness to palpation. No tenderness over piriformis and greater trochanter. Mildly positive Pearlean Brownie been negative Fadir Patient is tender to palpation over the performance muscle on the left side. Minimal tenderness over the left SI joint  Right hip is unremarkable. Impression and Recommendations:     This case required medical decision making of moderate complexity.

## 2013-02-20 DIAGNOSIS — H43399 Other vitreous opacities, unspecified eye: Secondary | ICD-10-CM | POA: Diagnosis not present

## 2013-02-21 ENCOUNTER — Other Ambulatory Visit: Payer: Self-pay | Admitting: Cardiovascular Disease

## 2013-03-09 ENCOUNTER — Other Ambulatory Visit: Payer: Self-pay | Admitting: Dermatology

## 2013-03-09 DIAGNOSIS — L570 Actinic keratosis: Secondary | ICD-10-CM | POA: Diagnosis not present

## 2013-03-09 DIAGNOSIS — Z85820 Personal history of malignant melanoma of skin: Secondary | ICD-10-CM | POA: Diagnosis not present

## 2013-03-09 DIAGNOSIS — D0470 Carcinoma in situ of skin of unspecified lower limb, including hip: Secondary | ICD-10-CM | POA: Diagnosis not present

## 2013-03-09 DIAGNOSIS — D485 Neoplasm of uncertain behavior of skin: Secondary | ICD-10-CM | POA: Diagnosis not present

## 2013-03-09 DIAGNOSIS — Z85828 Personal history of other malignant neoplasm of skin: Secondary | ICD-10-CM | POA: Diagnosis not present

## 2013-03-14 ENCOUNTER — Other Ambulatory Visit (INDEPENDENT_AMBULATORY_CARE_PROVIDER_SITE_OTHER): Payer: Medicare Other

## 2013-03-14 ENCOUNTER — Encounter: Payer: Self-pay | Admitting: Family Medicine

## 2013-03-14 ENCOUNTER — Ambulatory Visit (INDEPENDENT_AMBULATORY_CARE_PROVIDER_SITE_OTHER): Payer: Medicare Other | Admitting: Family Medicine

## 2013-03-14 VITALS — BP 110/62 | HR 73 | Wt 157.0 lb

## 2013-03-14 DIAGNOSIS — M7072 Other bursitis of hip, left hip: Secondary | ICD-10-CM

## 2013-03-14 DIAGNOSIS — G5702 Lesion of sciatic nerve, left lower limb: Secondary | ICD-10-CM

## 2013-03-14 DIAGNOSIS — M76899 Other specified enthesopathies of unspecified lower limb, excluding foot: Secondary | ICD-10-CM

## 2013-03-14 DIAGNOSIS — G5700 Lesion of sciatic nerve, unspecified lower limb: Secondary | ICD-10-CM

## 2013-03-14 NOTE — Patient Instructions (Signed)
Good to see you Hamstring tendonitis with ischial bursitis Tennis ball in back pocket when driving.  Continue other exercises 3 times a week and start these daily.  No running or jumping for next 48 hours  Compression sleeve with activity and 30 minutes afterward.  Come back again in 3-4 weeks.

## 2013-03-14 NOTE — Progress Notes (Signed)
CC: Left buttock pain follow up.   HPI: Patient is a 79 year old gentleman, coming in with recurrent left gluteal pain.  Was seen two months ago and was diagnosed with piriformis syndrome. Patient states he had been doing the exercises that did seem to be improving for quite some time. Unfortunately though when he continues to do his walking of approximately 5-6 miles a day he does have some discomfort especially going up hills. Patient is also noticing discomfort when he is sitting in his car. This is all left-sided. Patient states that it is a little bit more inferior can wear was previously. Patient denies any radiation to the legs or any back pain. Patient points to the pain being more just under the gluteal crease. Denies any nighttime awakening. Patient states that ibuprofen does seem to help.  Past medical, surgical, family and social history reviewed. Medications reviewed all in the electronic medical record.   Review of Systems: No headache, visual changes, nausea, vomiting, diarrhea, constipation, dizziness, abdominal pain, skin rash, fevers, chills, night sweats, weight loss, swollen lymph nodes, body aches, joint swelling, muscle aches, chest pain, shortness of breath, mood changes.   Objective:    Blood pressure 110/62, pulse 73, weight 157 lb (71.215 kg), SpO2 97.00%.   General: No apparent distress alert and oriented x3 mood and affect normal, dressed appropriately.  HEENT: Pupils equal, extraocular movements intact Respiratory: Patient's speak in full sentences and does not appear short of breath Cardiovascular: No lower extremity edema, non tender, no erythema Skin: Warm dry intact with no signs of infection or rash on extremities or on axial skeleton. Abdomen: Soft nontender Neuro: Cranial nerves II through XII are intact, neurovascularly intact in all extremities with 2+ DTRs and 2+ pulses. Lymph: No lymphadenopathy of posterior or anterior cervical chain or axillae  bilaterally.  Gait normal with good balance and coordination.  MSK: Non tender with full range of motion and good stability and symmetric strength and tone of shoulders, elbows, wrist, hip, knee and ankles bilaterally.  Hip: Left ROM IR: 35 Deg, ER: 45 Deg, Flexion: 120 Deg, Extension: 100 Deg, Abduction: 45 Deg, Adduction: 45 Deg Strength IR: 5/5, ER: 5/5, Flexion: 5/5, Extension: 5/5, Abduction: 5/5, Adduction: 5/5 Pelvic alignment unremarkable to inspection and palpation. Standing hip rotation and gait without trendelenburg sign / unsteadiness. Greater trochanter without tenderness to palpation. Mildly positive Pearlean Brownie been negative Fadir Patient is tender to palpation over ischial tuberosity at the insertion of the hamstring. Minimal tenderness over the left SI joint Patient does have mild tightness of the hamstrings bilaterally left greater than right  Muscular skeletal ultrasound was performed and interpreted by Antoine Primas, M today.  Limited ultrasound shows that patient should tuberosity does have hypoechoic changes surrounding it that does correspond well to a bursitis. Patient does have what appears to be chronic tendinopathy with some hyperechoic changes but no true tear appreciated in the tendon. No avulsion tear noted.  Impression: Ischial bursitis with chronic proximal hamstring tendinopathy.  Procedure: Real-time Ultrasound Guided Injection of left ischial tuberosity bursitis Device: GE Logiq E  Ultrasound guided injection is preferred based studies that show increased duration, increased effect, greater accuracy, decreased procedural pain, increased response rate, and decreased cost with ultrasound guided versus blind injection.  Verbal informed consent obtained.  Time-out conducted.  Noted no overlying erythema, induration, or other signs of local infection.  Skin prepped in a sterile fashion.  Local anesthesia: Topical Ethyl chloride.  With sterile technique and under  real  time ultrasound guidance:  Injected 4 cc of 0.5% Marcaine and 1 cc of Kenalog 40 mg/dL into the ischial bursa. Pictures taken. Completed without difficulty  Pain immediately resolved suggesting accurate placement of the medication.  Advised to call if fevers/chills, erythema, induration, drainage, or persistent bleeding.  Images permanently stored and available for review in the ultrasound unit.  Impression: Technically successful ultrasound guided injection.      Impression and Recommendations:     This case required medical decision making of moderate complexity.

## 2013-03-14 NOTE — Assessment & Plan Note (Signed)
The patient did have injection into the ischial bursa under ultrasound guidance. Patient tolerated the procedure well with decrease in pain immediately. Patient given home exercise program and he'll start 48 hours. Patient will gradually increase his activity over the course of time. Patient will continue with the walking as well as icing protocol. Can take anti-inflammatories as needed. Patient will follow up again in 4 weeks. If he continues to have trouble at that time we can consider further imaging versus physical therapy.

## 2013-03-15 DIAGNOSIS — D239 Other benign neoplasm of skin, unspecified: Secondary | ICD-10-CM | POA: Diagnosis not present

## 2013-03-15 DIAGNOSIS — Z85820 Personal history of malignant melanoma of skin: Secondary | ICD-10-CM | POA: Diagnosis not present

## 2013-03-15 DIAGNOSIS — D1801 Hemangioma of skin and subcutaneous tissue: Secondary | ICD-10-CM | POA: Diagnosis not present

## 2013-03-15 DIAGNOSIS — L821 Other seborrheic keratosis: Secondary | ICD-10-CM | POA: Diagnosis not present

## 2013-03-15 DIAGNOSIS — Z85828 Personal history of other malignant neoplasm of skin: Secondary | ICD-10-CM | POA: Diagnosis not present

## 2013-03-15 DIAGNOSIS — L819 Disorder of pigmentation, unspecified: Secondary | ICD-10-CM | POA: Diagnosis not present

## 2013-03-15 DIAGNOSIS — L570 Actinic keratosis: Secondary | ICD-10-CM | POA: Diagnosis not present

## 2013-03-19 DIAGNOSIS — H903 Sensorineural hearing loss, bilateral: Secondary | ICD-10-CM | POA: Diagnosis not present

## 2013-03-19 DIAGNOSIS — H9120 Sudden idiopathic hearing loss, unspecified ear: Secondary | ICD-10-CM | POA: Diagnosis not present

## 2013-03-19 DIAGNOSIS — H905 Unspecified sensorineural hearing loss: Secondary | ICD-10-CM | POA: Diagnosis not present

## 2013-03-19 DIAGNOSIS — H9319 Tinnitus, unspecified ear: Secondary | ICD-10-CM | POA: Diagnosis not present

## 2013-03-20 ENCOUNTER — Telehealth: Payer: Self-pay | Admitting: Internal Medicine

## 2013-03-20 NOTE — Telephone Encounter (Signed)
Rec'd from [facility] forward 4 pages to Dr.Norins ° °

## 2013-03-21 ENCOUNTER — Encounter: Payer: Self-pay | Admitting: Internal Medicine

## 2013-03-21 ENCOUNTER — Encounter: Payer: Self-pay | Admitting: Cardiovascular Disease

## 2013-03-21 ENCOUNTER — Ambulatory Visit (INDEPENDENT_AMBULATORY_CARE_PROVIDER_SITE_OTHER): Payer: Medicare Other | Admitting: Cardiovascular Disease

## 2013-03-21 ENCOUNTER — Ambulatory Visit (INDEPENDENT_AMBULATORY_CARE_PROVIDER_SITE_OTHER): Payer: Medicare Other | Admitting: Internal Medicine

## 2013-03-21 VITALS — BP 150/73 | HR 96 | Ht 70.0 in | Wt 152.0 lb

## 2013-03-21 VITALS — BP 124/64 | HR 67 | Temp 98.0°F | Wt 154.0 lb

## 2013-03-21 DIAGNOSIS — J370 Chronic laryngitis: Secondary | ICD-10-CM | POA: Diagnosis not present

## 2013-03-21 DIAGNOSIS — I2510 Atherosclerotic heart disease of native coronary artery without angina pectoris: Secondary | ICD-10-CM

## 2013-03-21 MED ORDER — LISINOPRIL 10 MG PO TABS: 10.0000 mg | ORAL_TABLET | Freq: Every day | ORAL | Status: DC

## 2013-03-21 MED ORDER — ROSUVASTATIN CALCIUM 40 MG PO TABS: 40.0000 mg | ORAL_TABLET | Freq: Every day | ORAL | Status: DC

## 2013-03-21 MED ORDER — PANTOPRAZOLE SODIUM 40 MG PO TBEC: 40.0000 mg | DELAYED_RELEASE_TABLET | Freq: Every day | ORAL | Status: DC

## 2013-03-21 NOTE — Progress Notes (Signed)
Patient ID: [name] [name], male   DOB: 07-05-1932, 79 y.o.   MRN: [id number] He has a hx of CAD, s/p stent to the LAD in 2008, HTN, HL, IBS, anxiety. Echo 9/11: EF 55-60%, normal motion, grade 2 diastolic dysfunction, mild MR, mild LAE, mild RAE, PASP 33. He saw Dr. Eden Emms in 03/2012 and noted throat pain intermittently. He was set up for an ETT. ETT 04/27/12: Abnormal with episodes of ventricular tachycardia-longest 18 beats. He was set up for cardiac catheterization. LHC 05/01/12: pLAD 30% ISR, mCFX 30%, dRCA 60-75% which had progressed from previous study in 2010. Patient was loaded with Plavix and brought back 12/18 for pressure wire analysis. FFR at peak hyperemia was 0.92 which was felt to be hemodynamically insignificant. Continued medical therapy was recommended. He was placed on Toprol and his Plavix was stopped.  Doing well since being on Toprol no palpitations chest pain or presyncope Previous visual issues turned out to be occular migrains  Needs lisinopril and crestor refilled Will have labs with Dr Arthur Holms in January  Has a new grand daughter in [place]   ROS: Denies fever, malais, weight loss, blurry vision, decreased visual acuity, cough, sputum, SOB, hemoptysis, pleuritic pain, palpitaitons, heartburn, abdominal pain, melena, lower extremity edema, claudication, or rash.  All other systems reviewed and negative  General: Affect appropriate Healthy:  appears stated age HEENT: normal Neck supple with no adenopathy JVP normal no bruits no thyromegaly Lungs clear with no wheezing and good diaphragmatic motion Heart:  S1/S2 no murmur, no rub, gallop or click PMI normal Abdomen: benighn, BS positve, no tenderness, no AAA no bruit.  No HSM or HJR Distal pulses intact with no bruits No edema Neuro non-focal Skin warm and dry No muscular weakness   Current Outpatient Prescriptions  Medication Sig Dispense Refill  . ALPRAZolam (XANAX) 0.25 MG tablet take 1/2 to 1 tablet by mouth  at bedtime if needed for sleep  30 tablet  0  . aspirin 81 MG tablet Take 81 mg by mouth daily.        . calcium carbonate (OS-CAL) 600 MG TABS Take 600 mg by mouth daily.        . CRESTOR 40 MG tablet TAKE 1 TABLET DAILY  90 tablet  0  . dutasteride (AVODART) 0.5 MG capsule Take 1 capsule (0.5 mg total) by mouth daily.  90 capsule  0  . finasteride (PROSCAR) 5 MG tablet Take 1 tablet (5 mg total) by mouth daily.  90 tablet  3  . lisinopril (PRINIVIL,ZESTRIL) 10 MG tablet TAKE 1 TABLET DAILY  90 tablet  0  . metoprolol succinate (TOPROL-XL) 25 MG 24 hr tablet Take 1 tablet (25 mg total) by mouth daily.  90 tablet  4  . nitroGLYCERIN (NITROSTAT) 0.4 MG SL tablet Place 1 tablet (0.4 mg total) under the tongue every 5 (five) minutes as needed. For shortness of breath  25 tablet  12  . Psyllium (MEDI-MUCIL PO) Take 240 g by mouth daily. Every morning       . tamsulosin (FLOMAX) 0.4 MG CAPS capsule Take 1 capsule (0.4 mg total) by mouth daily.  90 capsule  3  . zolpidem (AMBIEN) 10 MG tablet Take 10 mg by mouth at bedtime as needed. For sleep       No current facility-administered medications for this visit.    Allergies  Review of patient's allergies indicates no known allergies.  Electrocardiogram: 05/22/12  SR rate 57 normal   Assessment and Plan

## 2013-03-21 NOTE — Progress Notes (Signed)
Subjective:    Patient ID: [name] [name], male    DOB: September 09, 1932, 79 y.o.   MRN: [id number]  HPI [name] [name] presents for chronic throat clearing and he has been loosing his voice. He has tried gargling without success. No fevers, no chills. No cough.  He has seen Dr. Eden Emms today for cardiac follow up - good report.  Saw Dr. Michiel Sites - hamstring pull.   Past Medical History  Diagnosis Date  . History of BPH   . IBS (irritable bowel syndrome)   . HTN (hypertension)   . Anxiety   . Rhinitis   . Hyperlipidemia   . CAD (coronary artery disease)     a. s/p stent to LAD 2008;  b. abnormal ETT 11/13 with VTach => LHC pLAD 30% ISR, mCFX 30%, dRCA 60-75% which had progressed from previous study in 2010 => FFR of RCA 0.92 => med Rx   . Colon polyps   . Diverticular disease of colon   . Hemorrhoids     external and internal  . Hx of echocardiogram     a. Echo 9/11:  EF 55-60%, normal motion, grade 2 diastolic dysfunction, mild MR, mild LAE, mild RAE, PASP 33.  . Carotid stenosis     dopplers 1/14:  0-39% bilateral ICA stenosis  . Stroke, lacunar 08/24/2012    Old, 2011 head mri   Past Surgical History  Procedure Laterality Date  . Appendectomy  1956  . Nasal sinus surgery  1975  . Hernia repair    . Coronary stent placement  2008     Successful PCI of the lesion in the proximal LAD using a Promus    Family History  Problem Relation Age of Onset  . Coronary artery disease Father   . Heart attack Father   . Rheum arthritis Mother    History   Social History  . Marital Status: Married    Spouse Name: N/A    Number of Children: 3  . Years of Education: 16   Occupational History  . businessman, Copywriter, advertising    Social History Main Topics  . Smoking status: Never Smoker   . Smokeless tobacco: Never Used  . Alcohol Use: 2.4 - 3 oz/week    4-5 Glasses of wine per week     Comment: occasional  . Drug Use: No  . Sexual Activity: Yes    Partners: Female   Other Topics  Concern  . 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 He has two daughters, 1 in [place] 1 in [place] (Dec '12) and a son who works with him. Several grandchildren.Reports that he spends a good deal of time at his home in [facility] which he greatly enjoys and his home in the [place]. Enjoys driving his BMW convertible when in the mountains.      ACP - Yes CPR, yes for short-term mechanical ventilation for reversible disease. Recommended [email or web address] for consideration.    Current Outpatient Prescriptions on File Prior to Visit  Medication Sig Dispense Refill  . ALPRAZolam (XANAX) 0.25 MG tablet take 1/2 to 1 tablet by mouth at bedtime if needed for sleep  30 tablet  0  . aspirin 81 MG tablet Take 81 mg by mouth daily.        . calcium carbonate (OS-CAL) 600 MG TABS Take 600 mg by mouth daily.        Marland Kitchen  dutasteride (AVODART) 0.5 MG capsule Take 1 capsule (0.5 mg total) by mouth daily.  90 capsule  0  . finasteride (PROSCAR) 5 MG tablet Take 1 tablet (5 mg total) by mouth daily.  90 tablet  3  . lisinopril (PRINIVIL,ZESTRIL) 10 MG tablet Take 1 tablet (10 mg total) by mouth daily.  90 tablet  3  . metoprolol succinate (TOPROL-XL) 25 MG 24 hr tablet Take 1 tablet (25 mg total) by mouth daily.  90 tablet  4  . nitroGLYCERIN (NITROSTAT) 0.4 MG SL tablet Place 1 tablet (0.4 mg total) under the tongue every 5 (five) minutes as needed. For shortness of breath  25 tablet  12  . Psyllium (MEDI-MUCIL PO) Take 240 g by mouth daily. Every morning       . rosuvastatin (CRESTOR) 40 MG tablet Take 1 tablet (40 mg total) by mouth daily.  90 tablet  3  . tamsulosin (FLOMAX) 0.4 MG CAPS capsule Take 1 capsule (0.4 mg total) by mouth daily.  90 capsule  3  . zolpidem (AMBIEN) 10 MG tablet Take 10 mg by mouth at bedtime as needed. For sleep       No current  facility-administered medications on file prior to visit.      Review of Systems System review is negative for any constitutional, cardiac, pulmonary, GI or neuro symptoms or complaints other than as described in the HPI.     Objective:   Physical Exam Filed Vitals:   03/21/13 1704  BP: 124/64  Pulse: 67  Temp: 98 F (36.7 C)    Wt Readings from Last 3 Encounters:  03/21/13 154 lb (69.854 kg)  03/21/13 152 lb (68.947 kg)  03/14/13 157 lb (71.215 kg)   Gen'l- WNWD man in no distress HEENT- TMs normal, Throat clear Neck - supple Nodes - negative neck, supraclavicular region Lungs - CTAP      Assessment & Plan:  Chronic laryngitis - no sign of infection on exam. No sign of enlarged lymph nodes. The most common cause of chronic laryngitis is silent acid reflux. Other causes are a distant 2nd.  PLan  Protonix 40 mg once a day in the AM  If no improvement after 2-3 weeks of treatment will need referral to ENT for direct laryngoscopy.

## 2013-03-21 NOTE — Patient Instructions (Signed)
Your physician wants you to follow-up in: YEAR WITH DR NISHAN  You will receive a reminder letter in the mail two months in advance. If you don't receive a letter, please call our office to schedule the follow-up appointment.  Your physician recommends that you continue on your current medications as directed. Please refer to the Current Medication list given to you today. 

## 2013-03-21 NOTE — Assessment & Plan Note (Signed)
Well controlled.  Continue current medications and low sodium Dash type diet.    

## 2013-03-21 NOTE — Patient Instructions (Signed)
Chronic laryngitis - no sign of infection on exam. No sign of enlarged lymph nodes. The most common cause of chronic laryngitis is silent acid reflux. Other causes are a distant 2nd.  PLan  Protonix 40 mg once a day in the AM  If no improvement after 2-3 weeks of treatment will need referral to ENT for direct laryngoscopy.  Laryngitis At the top of your windpipe is your voice box. It is the source of your voice. Inside your voice box are 2 bands of muscles called vocal cords. When you breathe, your vocal cords are relaxed and open so that air can get into the lungs. When you decide to say something, these cords come together and vibrate. The sound from these vibrations goes into your throat and comes out through your mouth as sound. Laryngitis is an inflammation of the vocal cords that causes hoarseness, cough, loss of voice, sore throat, and dry throat. Laryngitis can be temporary (acute) or long-term (chronic). Most cases of acute laryngitis improve with time.Chronic laryngitis lasts for more than 3 weeks. CAUSES Laryngitis can often be related to excessive smoking, talking, or yelling, as well as inhalation of toxic fumes and allergies. Acute laryngitis is usually caused by a viral infection, vocal strain, measles or mumps, or bacterial infections. Chronic laryngitis is usually caused by vocal cord strain, vocal cord injury, postnasal drip, growths on the vocal cords, or acid reflux. SYMPTOMS   Cough.  Sore throat.  Dry throat. RISK FACTORS  Respiratory infections.  Exposure to irritating substances, such as cigarette smoke, excessive amounts of alcohol, stomach acids, and workplace chemicals.  Voice trauma, such as vocal cord injury from shouting or speaking too loud. DIAGNOSIS  Your cargiver will perform a physical exam. During the physical exam, your caregiver will examine your throat. The most common sign of laryngitis is hoarseness. Laryngoscopy may be necessary to confirm the  diagnosis of this condition. This procedure allows your caregiver to look into the larynx. HOME CARE INSTRUCTIONS  Drink enough fluids to keep your urine clear or pale yellow.  Rest until you no longer have symptoms or as directed by your caregiver.  Breathe in moist air.  Take all medicine as directed by your caregiver.  Do not smoke.  Talk as little as possible (this includes whispering).  Write on paper instead of talking until your voice is back to normal.  Follow up with your caregiver if your condition has not improved after 10 days. SEEK MEDICAL CARE IF:   You have trouble breathing.  You cough up blood.  You have persistent fever.  You have increasing pain.  You have difficulty swallowing. MAKE SURE YOU:  Understand these instructions.  Will watch your condition.  Will get help right away if you are not doing well or get worse. Document Released: 05/03/2005 Document Revised: 07/26/2011 Document Reviewed: 07/09/2010 [facility] Patient Information 2014 [place], [place].

## 2013-03-21 NOTE — Assessment & Plan Note (Signed)
Cholesterol is at goal.  Continue current dose of statin and diet Rx.  No myalgias or side effects.  F/U  LFT's in 6 months. Lab Results  Component Value Date   LDLCALC 39 03/31/2012

## 2013-03-21 NOTE — Assessment & Plan Note (Signed)
Stable Normal EF  Continue beta blocker

## 2013-03-21 NOTE — Assessment & Plan Note (Signed)
Stable with no angina and good activity level.  Continue medical Rx  

## 2013-03-21 NOTE — Progress Notes (Signed)
Pre-visit discussion using our clinic review tool. No additional management support is needed unless otherwise documented below in the visit note.  

## 2013-03-23 DIAGNOSIS — L570 Actinic keratosis: Secondary | ICD-10-CM | POA: Diagnosis not present

## 2013-04-24 DIAGNOSIS — L570 Actinic keratosis: Secondary | ICD-10-CM | POA: Diagnosis not present

## 2013-05-14 ENCOUNTER — Telehealth: Payer: Self-pay | Admitting: Internal Medicine

## 2013-05-14 NOTE — Telephone Encounter (Signed)
Patient notified via voicemail that alternative med is acceptable and to let us know if it does not work as well.

## 2013-05-14 NOTE — Telephone Encounter (Signed)
[facility]/Patient [phone] called with medication question.  Asking if Alfuzosin 10 mg is the same thing as Flomax.  Pharmacy/[facility] made a drug substitution.  Per http://www.ward.com/, advised it is in the same classification of drugs.  "Alfuzosin belongs to a class of drugs called alpha-blockers. It is used for increasing the flow of urine that is reduced by benign prostatic hypertrophy (BPH). Alpha blockers relax the muscles in the prostate gland and the neck of the bladder. This allows the urethra (the tube that conducts urine out of the bladder) to open wider so that urine flows more easily. Other medicines in this of drugs include terazosin (Hytrin), prazosin (Minipress), doxazosin (Cardura), and tamsulosin (Flomax)."

## 2013-05-14 NOTE — Telephone Encounter (Signed)
Yes this is an acceptable alternative. If it doesn't work as well we can proceed with a prior authorization request for coverage of a non-formulary drug. Insurance like women: can't live with or without them.

## 2013-05-22 ENCOUNTER — Encounter: Payer: Self-pay | Admitting: Internal Medicine

## 2013-06-07 ENCOUNTER — Telehealth: Payer: Self-pay | Admitting: *Deleted

## 2013-06-07 MED ORDER — DUTASTERIDE 0.5 MG PO CAPS: 0.5000 mg | ORAL_CAPSULE | Freq: Every day | ORAL | Status: DC

## 2013-06-07 MED ORDER — TAMSULOSIN HCL 0.4 MG PO CAPS: 0.4000 mg | ORAL_CAPSULE | Freq: Every day | ORAL | Status: DC

## 2013-06-07 NOTE — Telephone Encounter (Signed)
Pt states he was switch to Alfucosin 10 mg & Finasteride 10 mg because these two meds was on his insurance formulary plan. Pt states since he has been taking these two meds he has not been doing to well. Requesting to go back on [place]. [facility]. Is this ok...Johny Chess

## 2013-06-07 NOTE — Telephone Encounter (Signed)
Notified pt med has been change back & [facility]...lmb

## 2013-06-07 NOTE — Telephone Encounter (Signed)
Ok to go back to flomaxc 0.8 mg once a day and avodart once a day. Will need to do a PA to document failure of covered drugs

## 2013-06-13 ENCOUNTER — Other Ambulatory Visit (HOSPITAL_COMMUNITY): Payer: Self-pay | Admitting: *Deleted

## 2013-06-13 DIAGNOSIS — I6529 Occlusion and stenosis of unspecified carotid artery: Secondary | ICD-10-CM

## 2013-06-18 ENCOUNTER — Ambulatory Visit (HOSPITAL_COMMUNITY): Payer: Medicare Other | Attending: Cardiovascular Disease

## 2013-06-18 ENCOUNTER — Encounter (HOSPITAL_COMMUNITY): Payer: Medicare Other

## 2013-06-18 ENCOUNTER — Encounter: Payer: Self-pay | Admitting: Cardiovascular Disease

## 2013-06-18 DIAGNOSIS — I658 Occlusion and stenosis of other precerebral arteries: Secondary | ICD-10-CM | POA: Insufficient documentation

## 2013-06-18 DIAGNOSIS — E785 Hyperlipidemia, unspecified: Secondary | ICD-10-CM | POA: Diagnosis not present

## 2013-06-18 DIAGNOSIS — I2510 Atherosclerotic heart disease of native coronary artery without angina pectoris: Secondary | ICD-10-CM | POA: Insufficient documentation

## 2013-06-18 DIAGNOSIS — H539 Unspecified visual disturbance: Secondary | ICD-10-CM | POA: Diagnosis not present

## 2013-06-18 DIAGNOSIS — I6529 Occlusion and stenosis of unspecified carotid artery: Secondary | ICD-10-CM | POA: Insufficient documentation

## 2013-06-18 DIAGNOSIS — I10 Essential (primary) hypertension: Secondary | ICD-10-CM | POA: Diagnosis not present

## 2013-06-19 ENCOUNTER — Telehealth: Payer: Self-pay | Admitting: Cardiovascular Disease

## 2013-06-19 NOTE — Telephone Encounter (Signed)
New message ° ° ° ° ° °Pt returning nurses' phone call °

## 2013-06-19 NOTE — Telephone Encounter (Signed)
PT AWARE OF CAROTID RESULTS ./CY 

## 2013-06-26 ENCOUNTER — Other Ambulatory Visit: Payer: Medicare Other

## 2013-06-26 ENCOUNTER — Ambulatory Visit (INDEPENDENT_AMBULATORY_CARE_PROVIDER_SITE_OTHER): Payer: Medicare Other | Admitting: Internal Medicine

## 2013-06-26 ENCOUNTER — Encounter: Payer: Self-pay | Admitting: Internal Medicine

## 2013-06-26 VITALS — BP 118/68 | HR 68 | Temp 97.0°F | Ht 70.0 in | Wt 153.8 lb

## 2013-06-26 DIAGNOSIS — N318 Other neuromuscular dysfunction of bladder: Secondary | ICD-10-CM | POA: Diagnosis not present

## 2013-06-26 DIAGNOSIS — I10 Essential (primary) hypertension: Secondary | ICD-10-CM | POA: Diagnosis not present

## 2013-06-26 DIAGNOSIS — Z0000 Encounter for general adult medical examination without abnormal findings: Secondary | ICD-10-CM

## 2013-06-26 DIAGNOSIS — K5900 Constipation, unspecified: Secondary | ICD-10-CM | POA: Insufficient documentation

## 2013-06-26 DIAGNOSIS — E785 Hyperlipidemia, unspecified: Secondary | ICD-10-CM

## 2013-06-26 DIAGNOSIS — I2510 Atherosclerotic heart disease of native coronary artery without angina pectoris: Secondary | ICD-10-CM | POA: Diagnosis not present

## 2013-06-26 DIAGNOSIS — G5700 Lesion of sciatic nerve, unspecified lower limb: Secondary | ICD-10-CM | POA: Diagnosis not present

## 2013-06-26 DIAGNOSIS — N3281 Overactive bladder: Secondary | ICD-10-CM

## 2013-06-26 DIAGNOSIS — G5702 Lesion of sciatic nerve, left lower limb: Secondary | ICD-10-CM

## 2013-06-26 DIAGNOSIS — Z23 Encounter for immunization: Secondary | ICD-10-CM

## 2013-06-26 LAB — LIPID PANEL
CHOL/HDL RATIO: 2
CHOLESTEROL: 129 mg/dL (ref 0–200)
HDL: 66.7 mg/dL (ref >39.00)
LDL Cholesterol: 44 mg/dL (ref 0–99)
Triglycerides: 93 mg/dL (ref 0.0–149.0)
VLDL: 18.6 mg/dL (ref 0.0–40.0)

## 2013-06-26 LAB — BASIC METABOLIC PANEL
BUN: 16 mg/dL (ref 6–23)
CALCIUM: 9.2 mg/dL (ref 8.4–10.5)
CO2: 24 mEq/L (ref 19–32)
CREATININE: 1 mg/dL (ref 0.4–1.5)
Chloride: 109 mEq/L (ref 96–112)
GFR: 73.39 mL/min (ref >60.00)
GLUCOSE: 95 mg/dL (ref 70–99)
Potassium: 3.9 mEq/L (ref 3.5–5.1)
Sodium: 140 mEq/L (ref 135–145)

## 2013-06-26 NOTE — Assessment & Plan Note (Addendum)
Taking and tolerating statin therapy and exercises daily.  Plan Follow up lipid panel with recommendations to follow

## 2013-06-26 NOTE — Progress Notes (Signed)
Subjective:     Patient ID: [name] [name], male   DOB: 1932/06/16, 80 y.o.   MRN: [id number]  HPI The patient is here for annual Medicare wellness examination and management of other chronic and acute problems.   The risk factors are reflected in the social history.  The roster of all physicians providing medical care to patient - is listed in the Snapshot section of the chart.  Activities of daily living:  The patient is 100% independent in all ADLs: dressing, toileting, feeding as well as independent mobility  Home safety : The patient has smoke detectors in the home. They wear seatbelts. No firearms at home. There is no violence in the home.   There is no risks for hepatitis, STDs or HIV. There is no   history of blood transfusion. They have no travel history to infectious disease endemic areas of the world.  The patient has seen their dentist in the last six month. They have seen their eye doctor in the last year. They admit to deafness in left ear and has had audiologic testing in the last year.  They do not  have excessive sun exposure. Discussed the need for sun protection: hats, long sleeves and use of sunscreen if there is significant sun exposure. No falls in the last year.  Diet: the importance of a healthy diet is discussed. They do have a healthy diet, seldom eats processed foods.  The patient has a regular exercise program: walking,1 hour duration, 7 days per week.  The benefits of regular aerobic exercise were discussed.  Depression screen: there are no signs or vegative symptoms of depression- irritability, change in appetite, anhedonia, sadness/tearfullness.  Cognitive assessment: the patient manages all their financial and personal affairs and is actively engaged.   The following portions of the patient's history were reviewed and updated as appropriate: allergies, current medications, past family history, past medical history,  past surgical history, past social history   and problem list.  Vision, hearing, body mass index were assessed and reviewed.   During the course of the visit the patient was educated and counseled about appropriate screening and preventive services including : fall prevention , diabetes screening, nutrition counseling, colorectal cancer screening, and recommended immunizations.  Past Medical History  Diagnosis Date  . History of BPH   . IBS (irritable bowel syndrome)   . HTN (hypertension)   . Anxiety   . Rhinitis   . Hyperlipidemia   . CAD (coronary artery disease)     a. s/p stent to LAD 2008;  b. abnormal ETT 11/13 with VTach => LHC pLAD 30% ISR, mCFX 30%, dRCA 60-75% which had progressed from previous study in 2010 => FFR of RCA 0.92 => med Rx   . Colon polyps   . Diverticular disease of colon   . Hemorrhoids     external and internal  . Hx of echocardiogram     a. Echo 9/11:  EF 55-60%, normal motion, grade 2 diastolic dysfunction, mild MR, mild LAE, mild RAE, PASP 33.  . Carotid stenosis     dopplers 1/14:  0-39% bilateral ICA stenosis  . Stroke, lacunar 08/24/2012    Old, 2011 head mri   Past Surgical History  Procedure Laterality Date  . Appendectomy  1956  . Nasal sinus surgery  1975  . Hernia repair    . Coronary stent placement  2008     Successful PCI of the lesion in the proximal LAD using a Promus  Family History  Problem Relation Age of Onset  . Coronary artery disease Father   . Heart attack Father   . Rheum arthritis Mother    History   Social History  . Marital Status: Married    Spouse Name: N/A    Number of Children: 3  . Years of Education: 16   Occupational History  . businessman, Marine scientist    Social History Main Topics  . Smoking status: Never Smoker   . Smokeless tobacco: Never Used  . Alcohol Use: 2.4 - 3 oz/week    4-5 Glasses of wine per week     Comment: occasional  . Drug Use: No  . Sexual Activity: Yes    Partners: Female   Other Topics Concern  . 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 He has two daughters, 1 in [place] 1 in [place] (Dec '12) and a son who works with him. Several grandchildren.Reports that he spends a good deal of time at his home in [place] [facility] which he greatly enjoys and his home in the [place]. Enjoys driving his BMW [place]ible when in the mountains.      ACP - Yes CPR, yes for short-term mechanical ventilation for reversible disease. Recommended [email or web address] for consideration.      Review of Systems Constitutional:  Negative for fever, chills, activity change and unexpected weight change.  HEENT:  Positive for hearing loss, Negative for ear pain, congestion, neck stiffness and postnasal drip. Negative for sore throat or swallowing problems. Negative for dental complaints.   Eyes: Negative for vision loss or change in visual acuity.  Respiratory: Negative for chest tightness and wheezing. Negative for DOE.   Cardiovascular: Negative for chest pain or palpitations. No decreased exercise tolerance Gastrointestinal: Constipation, takes med 1/wk. Fiber, regular exerciese. No bloating or gas. No reflux or indigestion Genitourinary: Positive for urgency, frequency, negative for flank pain and difficulty urinating.  Musculoskeletal: Negative for myalgias, back pain, arthralgias and gait problem.  Neurological: Negative for dizziness, tremors, weakness and headaches.  Hematological: Negative for adenopathy.  Psychiatric/Behavioral: Negative for behavioral problems and dysphoric mood.       Objective:   Physical Exam Filed Vitals:   06/26/13 1328  BP: 118/68  Pulse: 68  Temp: 97 F (36.1 C)   Wt Readings from Last 3 Encounters:  06/26/13 153 lb 12.8 oz (69.763 kg)  03/21/13 154 lb (69.854 kg)  03/21/13 152 lb (68.947 kg)   Gen'l: Well nourished well developed      male in no acute distress  HEENT: Head: Normocephalic and atraumatic. Right Ear: External ear normal. EAC/TM nl. Left Ear: External ear normal.  EAC/TM nl. Nose: Nose normal. Mouth/Throat: Oropharynx is clear and moist. Dentition - native, in good repair. No buccal or palatal lesions. Posterior pharynx clear. Eyes: Conjunctivae and sclera clear. EOM intact. Pupils are equal, round, and reactive to light. Right eye exhibits no discharge. Left eye exhibits no discharge. Neck: Normal range of motion. Neck supple. No JVD present. No tracheal deviation present. No thyromegaly present.  Cardiovascular: Normal rate, regular rhythm, no gallop, no friction rub, no murmur heard.      Quiet precordium. 2+ radial and DP pulses . No carotid bruits Pulmonary/Chest: Effort normal. No respiratory distress or increased WOB, no wheezes, no rales. No chest wall deformity or CVAT. Abdomen: Soft. Bowel sounds are normal in  all quadrants. He exhibits no distension, no tenderness, no rebound or guarding, No heptosplenomegaly  Genitourinary:  Deferred Musculoskeletal: Normal range of motion. He exhibits no edema and no tenderness. 5/5 Strength in biceps, triceps, deltoids, hamstrings, quadriceps.     Full range of motion preserved about all small, median and large joints.  Lymphadenopathy:    He has no cervical or supraclavicular adenopathy.  Neurological: He is alert and oriented to person, place, and time. CN II-XII intact. DTRs 2+ and symmetrical biceps, radial and patellar tendons. Cerebellar function normal with no tremor, rigidity, normal gait and station.  Skin: Skin is warm and dry. No rash noted. No erythema.  Psychiatric: He has a normal mood and affect. His behavior is normal. Thought content normal.      Assessment:     Per problem list     Plan:     Per problem list

## 2013-06-26 NOTE — Patient Instructions (Addendum)
It has been a pleasure providing medical care for you.   1) Urinary Frequency - Continue Myrbetriq, Avadart, and tamsulosin.  - We sent a referrall to a urologist (Dr. Franchot Gallo)  2) Blood pressure - Your blood pressure is fantastic!  - Continue the good diet and exercise.   3) Health Maintenance - Colonoscopy to follow up on polyps - Checking a lipid panel and metabolic panel today - I will share the results via MyChart  4) Retirement - I am retiring at the end of March. - Starting on April 1, Dr. Parks Ranger will be taking over your care.

## 2013-06-26 NOTE — Progress Notes (Signed)
Pre visit review using our clinic review tool, if applicable. No additional management support is needed unless otherwise documented below in the visit note. 

## 2013-06-26 NOTE — Assessment & Plan Note (Addendum)
Pain partially controlled with tylenol and stretching.   Plan Reemphasized the value of stretch and massage, using a rubber ball for myofascial release.

## 2013-06-26 NOTE — Assessment & Plan Note (Signed)
This is a very pleasant 80 year old man who presents for an annual physical. Interval history is unremarkable for acute illness, surgery, or hospitalization. His medical problems are well controlled, he is active and walks 3 miles daily, and he eats well.   Given the Prevnar vaccine today Follow up with GI for Colonoscopy in March '15 (adenomatous polyps in Jan '12) Lipid panel and BMET today

## 2013-06-26 NOTE — Assessment & Plan Note (Addendum)
2/2: Carotid Duplex showed R ICA 6-86% stenosis, LICA showed 16-83% stenosis.  Stable at this time and closely followed by Dr. Johnsie Cancel.  Plan Continue risk factor modification.

## 2013-06-26 NOTE — Assessment & Plan Note (Addendum)
Waking up 2-3 times per night to use the bathroom despite Myrbetriq. He is also taking tamsulosin for BPH.   Plan For refractory symptoms despite 2 medications will refer to Urology -Dr. Franchot Gallo for further assessment and treatment.

## 2013-06-26 NOTE — Assessment & Plan Note (Signed)
Continue current regimen, exercise, and dietary changes. Continue fiber.

## 2013-06-26 NOTE — Assessment & Plan Note (Signed)
BP Readings from Last 3 Encounters:  06/26/13 118/68  03/21/13 124/64  03/21/13 150/73    Continue exercise and weight management. Well controlled on current medications.

## 2013-07-04 ENCOUNTER — Telehealth: Payer: Self-pay | Admitting: *Deleted

## 2013-07-04 NOTE — Telephone Encounter (Signed)
Patient phoned requesting refill for his [place].  Last OV with PCP 06/26/13 and ambien last ordered 07/20/10.  Please advise.  CB# [phone]

## 2013-07-04 NOTE — Telephone Encounter (Signed)
Ok for refill x 5 

## 2013-07-05 MED ORDER — ZOLPIDEM TARTRATE 10 MG PO TABS: 10.0000 mg | ORAL_TABLET | Freq: Every evening | ORAL | Status: DC | PRN

## 2013-07-05 NOTE — Telephone Encounter (Signed)
Script printed & awaiting MD signature & patient notified & pharmacy to [facility].

## 2013-07-11 DIAGNOSIS — R351 Nocturia: Secondary | ICD-10-CM | POA: Diagnosis not present

## 2013-07-17 ENCOUNTER — Telehealth: Payer: Self-pay

## 2013-07-17 NOTE — Telephone Encounter (Signed)
My chart message sent 06/27/13. Everything was fine. If cannot access MyChart please print resutls and comments to be mailed.

## 2013-07-17 NOTE — Telephone Encounter (Signed)
Patient notified lab were fine and he does want them mailed. This was done.

## 2013-07-17 NOTE — Telephone Encounter (Signed)
The patient called and is hoping to get information on his lab results from his cpe.

## 2013-07-27 DIAGNOSIS — H2510 Age-related nuclear cataract, unspecified eye: Secondary | ICD-10-CM | POA: Diagnosis not present

## 2013-07-27 DIAGNOSIS — H5000 Unspecified esotropia: Secondary | ICD-10-CM | POA: Diagnosis not present

## 2013-07-27 DIAGNOSIS — H5210 Myopia, unspecified eye: Secondary | ICD-10-CM | POA: Diagnosis not present

## 2013-07-30 ENCOUNTER — Ambulatory Visit (AMBULATORY_SURGERY_CENTER): Payer: Self-pay | Admitting: *Deleted

## 2013-07-30 VITALS — Ht 70.0 in | Wt 153.6 lb

## 2013-07-30 DIAGNOSIS — Z86010 Personal history of colonic polyps: Secondary | ICD-10-CM

## 2013-07-30 MED ORDER — MOVIPREP 100 G PO SOLR: ORAL | Status: DC

## 2013-07-30 NOTE — Progress Notes (Signed)
No allergies to eggs or soy. No problems with anesthesia.  

## 2013-08-02 DIAGNOSIS — D492 Neoplasm of unspecified behavior of bone, soft tissue, and skin: Secondary | ICD-10-CM | POA: Diagnosis not present

## 2013-08-02 DIAGNOSIS — Z411 Encounter for cosmetic surgery: Secondary | ICD-10-CM | POA: Diagnosis not present

## 2013-08-08 DIAGNOSIS — R351 Nocturia: Secondary | ICD-10-CM | POA: Diagnosis not present

## 2013-08-20 ENCOUNTER — Encounter: Payer: Medicare Other | Admitting: Internal Medicine

## 2013-08-22 ENCOUNTER — Encounter: Payer: Self-pay | Admitting: Internal Medicine

## 2013-08-22 DIAGNOSIS — D0430 Carcinoma in situ of skin of unspecified part of face: Secondary | ICD-10-CM | POA: Diagnosis not present

## 2013-08-22 DIAGNOSIS — D0439 Carcinoma in situ of skin of other parts of face: Secondary | ICD-10-CM | POA: Diagnosis not present

## 2013-08-22 NOTE — Telephone Encounter (Signed)
Error

## 2013-08-23 DIAGNOSIS — D0439 Carcinoma in situ of skin of other parts of face: Secondary | ICD-10-CM | POA: Diagnosis not present

## 2013-08-23 DIAGNOSIS — D492 Neoplasm of unspecified behavior of bone, soft tissue, and skin: Secondary | ICD-10-CM | POA: Diagnosis not present

## 2013-08-23 DIAGNOSIS — Z411 Encounter for cosmetic surgery: Secondary | ICD-10-CM | POA: Diagnosis not present

## 2013-08-23 DIAGNOSIS — D0430 Carcinoma in situ of skin of unspecified part of face: Secondary | ICD-10-CM | POA: Diagnosis not present

## 2013-08-27 ENCOUNTER — Ambulatory Visit (AMBULATORY_SURGERY_CENTER): Payer: Medicare Other | Admitting: Internal Medicine

## 2013-08-27 ENCOUNTER — Encounter: Payer: Self-pay | Admitting: Internal Medicine

## 2013-08-27 VITALS — BP 119/67 | HR 58 | Temp 97.2°F | Resp 16 | Ht 70.0 in | Wt 153.0 lb

## 2013-08-27 DIAGNOSIS — D126 Benign neoplasm of colon, unspecified: Secondary | ICD-10-CM

## 2013-08-27 DIAGNOSIS — Z86010 Personal history of colonic polyps: Secondary | ICD-10-CM | POA: Diagnosis not present

## 2013-08-27 DIAGNOSIS — I6529 Occlusion and stenosis of unspecified carotid artery: Secondary | ICD-10-CM | POA: Diagnosis not present

## 2013-08-27 DIAGNOSIS — I2510 Atherosclerotic heart disease of native coronary artery without angina pectoris: Secondary | ICD-10-CM | POA: Diagnosis not present

## 2013-08-27 DIAGNOSIS — I10 Essential (primary) hypertension: Secondary | ICD-10-CM | POA: Diagnosis not present

## 2013-08-27 MED ORDER — SODIUM CHLORIDE 0.9 % IV SOLN: 500.0000 mL | INTRAVENOUS | Status: DC

## 2013-08-27 NOTE — Op Note (Signed)
[place]  [employer]. [place], [address]   COLONOSCOPY PROCEDURE REPORT  PATIENT: [name] [name], [name] [name]  MR#: [id number] BIRTHDATE: Jan 04, 1933 , 28  yrs. old GENDER: Male ENDOSCOPIST: Eustace Quail, MD REFERRED IH:[id number] Program Recall PROCEDURE DATE:  08/27/2013 PROCEDURE:   Colonoscopy with snare polypectomy x1 First Screening Colonoscopy - Avg.  risk and is 50 yrs.  old or older - No.  Prior Negative Screening - Now for repeat screening. N/A  History of Adenoma - Now for follow-up colonoscopy & has been > or = to 3 yrs.  Yes hx of adenoma.  Has been 3 or more years since last colonoscopy.  Polyps Removed Today? Yes. ASA CLASS:   Class II INDICATIONS:Patient's personal history of adenomatous colon polyps. Prior examinations 2003, 2006, 2012. MEDICATIONS: MAC sedation, administered by CRNA and propofol (Diprivan) 230mg  IV  DESCRIPTION OF PROCEDURE:   After the risks benefits and alternatives of the procedure were thoroughly explained, informed consent was obtained.  A digital rectal exam revealed external hemorrhoids.   The LB [id number]  endoscope was introduced through the anus and advanced to the cecum, which was identified by both the appendix and ileocecal valve. No adverse events experienced.   The quality of the prep was excellent, using MoviPrep  The instrument was then slowly withdrawn as the colon was fully examined.  COLON FINDINGS: A diminutive polyp was found in the transverse colon.  A polypectomy was performed with a cold snare.  The resection was complete and the polyp tissue was completely retrieved.   Mild diverticulosis was noted in the sigmoid colon. The colon mucosa was otherwise normal.  Retroflexed views revealed no abnormalities. The time to cecum=4 minutes 25 seconds. Withdrawal time=12 minutes 14 seconds.  The scope was withdrawn and the procedure completed. COMPLICATIONS: There were no complications.  ENDOSCOPIC  IMPRESSION: 1.   Diminutive polyp was found in the transverse colon; polypectomy was performed with a cold snare 2.   Mild diverticulosis was noted in the sigmoid colon 3.   The colon mucosa was otherwise normal  RECOMMENDATIONS: 1. Return to the care of your primary provider.  GI follow up as needed   eSigned:  Eustace Quail, MD 08/27/2013 11:39 AM   cc: The Patient and Neena Rhymes, MD

## 2013-08-27 NOTE — Progress Notes (Signed)
Procedure ends, to recovery, report given and VSS. 

## 2013-08-27 NOTE — Progress Notes (Signed)
Called to room to assist during endoscopic procedure.  Patient ID and intended procedure confirmed with present staff. Received instructions for my participation in the procedure from the performing physician.  

## 2013-08-27 NOTE — Patient Instructions (Signed)
YOU HAD AN ENDOSCOPIC PROCEDURE TODAY AT THE [facility]: Refer to the procedure report that was given to you for any specific questions about what was found during the examination.  If the procedure report does not answer your questions, please call your gastroenterologist to clarify.  If you requested that your care partner not be given the details of your procedure findings, then the procedure report has been included in a sealed envelope for you to review at your convenience later.  YOU SHOULD EXPECT: Some feelings of bloating in the abdomen. Passage of more gas than usual.  Walking can help get rid of the air that was put into your GI tract during the procedure and reduce the bloating. If you had a lower endoscopy (such as a colonoscopy or flexible sigmoidoscopy) you may notice spotting of blood in your stool or on the toilet paper. If you underwent a bowel prep for your procedure, then you may not have a normal bowel movement for a few days.  DIET: Your first meal following the procedure should be a light meal and then it is ok to progress to your normal diet.  A half-sandwich or bowl of soup is an example of a good first meal.  Heavy or fried foods are harder to digest and may make you feel nauseous or bloated.  Likewise meals heavy in dairy and vegetables can cause extra gas to form and this can also increase the bloating.  Drink plenty of fluids but you should avoid alcoholic beverages for 24 hours.  ACTIVITY: Your care partner should take you home directly after the procedure.  You should plan to take it easy, moving slowly for the rest of the day.  You can resume normal activity the day after the procedure however you should NOT DRIVE or use heavy machinery for 24 hours (because of the sedation medicines used during the test).    SYMPTOMS TO REPORT IMMEDIATELY: A gastroenterologist can be reached at any hour.  During normal business hours, 8:30 AM to 5:00 PM Monday through Friday,  call [phone].  After hours and on weekends, please call the GI answering service at [phone] who will take a message and have the physician on call contact you.   Following lower endoscopy (colonoscopy or flexible sigmoidoscopy):  Excessive amounts of blood in the stool  Significant tenderness or worsening of abdominal pains  Swelling of the abdomen that is new, acute  Fever of 100F or higher    FOLLOW UP: If any biopsies were taken you will be contacted by phone or by letter within the next 1-3 weeks.  Call your gastroenterologist if you have not heard about the biopsies in 3 weeks.  Our staff will call the home number listed on your records the next business day following your procedure to check on you and address any questions or concerns that you may have at that time regarding the information given to you following your procedure. This is a courtesy call and so if there is no answer at the home number and we have not heard from you through the emergency physician on call, we will assume that you have returned to your regular daily activities without incident.  SIGNATURES/CONFIDENTIALITY: You and/or your care partner have signed paperwork which will be entered into your electronic medical record.  These signatures attest to the fact that that the information above on your After Visit Summary has been reviewed and is understood.  Full responsibility of the confidentiality   of this discharge information lies with you and/or your care-partner.   Polyp, diverticulosis, and high fiber diet given.

## 2013-08-28 ENCOUNTER — Telehealth: Payer: Self-pay | Admitting: *Deleted

## 2013-08-28 NOTE — Telephone Encounter (Signed)
  Follow up Call-  Call back number 08/27/2013  Post procedure Call Back phone  # [phone]  Permission to leave phone message Yes     Patient questions:  Do you have a fever, pain , or abdominal swelling? no Pain Score  0 *  Have you tolerated food without any problems? yes  Have you been able to return to your normal activities? yes  Do you have any questions about your discharge instructions: Diet   no Medications  no Follow up visit  no  Do you have questions or concerns about your Care? no  Actions: * If pain score is 4 or above: No action needed, pain <4.

## 2013-08-29 DIAGNOSIS — D099 Carcinoma in situ, unspecified: Secondary | ICD-10-CM | POA: Diagnosis not present

## 2013-08-30 ENCOUNTER — Encounter: Payer: Self-pay | Admitting: Internal Medicine

## 2013-09-05 DIAGNOSIS — D0439 Carcinoma in situ of skin of other parts of face: Secondary | ICD-10-CM | POA: Diagnosis not present

## 2013-09-05 DIAGNOSIS — D0430 Carcinoma in situ of skin of unspecified part of face: Secondary | ICD-10-CM | POA: Diagnosis not present

## 2013-09-05 DIAGNOSIS — L570 Actinic keratosis: Secondary | ICD-10-CM | POA: Diagnosis not present

## 2013-09-05 DIAGNOSIS — L819 Disorder of pigmentation, unspecified: Secondary | ICD-10-CM | POA: Diagnosis not present

## 2013-09-05 DIAGNOSIS — D1801 Hemangioma of skin and subcutaneous tissue: Secondary | ICD-10-CM | POA: Diagnosis not present

## 2013-09-05 DIAGNOSIS — Z85820 Personal history of malignant melanoma of skin: Secondary | ICD-10-CM | POA: Diagnosis not present

## 2013-09-05 DIAGNOSIS — L821 Other seborrheic keratosis: Secondary | ICD-10-CM | POA: Diagnosis not present

## 2013-09-05 DIAGNOSIS — Z85828 Personal history of other malignant neoplasm of skin: Secondary | ICD-10-CM | POA: Diagnosis not present

## 2013-09-12 DIAGNOSIS — R351 Nocturia: Secondary | ICD-10-CM | POA: Diagnosis not present

## 2013-09-15 ENCOUNTER — Other Ambulatory Visit: Payer: Self-pay | Admitting: Cardiovascular Disease

## 2013-10-26 ENCOUNTER — Ambulatory Visit (INDEPENDENT_AMBULATORY_CARE_PROVIDER_SITE_OTHER): Payer: Medicare Other | Admitting: Internal Medicine

## 2013-10-26 ENCOUNTER — Encounter: Payer: Self-pay | Admitting: Internal Medicine

## 2013-10-26 VITALS — BP 110/60 | HR 66 | Ht 70.0 in | Wt 153.2 lb

## 2013-10-26 DIAGNOSIS — Z86010 Personal history of colonic polyps: Secondary | ICD-10-CM

## 2013-10-26 DIAGNOSIS — K5900 Constipation, unspecified: Secondary | ICD-10-CM

## 2013-10-26 DIAGNOSIS — I2510 Atherosclerotic heart disease of native coronary artery without angina pectoris: Secondary | ICD-10-CM

## 2013-10-26 NOTE — Progress Notes (Signed)
HISTORY OF PRESENT ILLNESS:  [name] [name] is a 80 y.o. male with multiple medical problems as listed below. He has been followed in this office for IBS-type symptoms and adenomatous colon polyps. He presents today with a new complaint of constipation. He reports that he was having difficulty with nocturia for which she underwent urology evaluation. He was tried on several medications and eventually [name] which helps. However, he developed difficulties with constipation. He discontinued the medication but constipation persisted. He has taken Dulcolax with satisfactory relief of constipation. He is concerned about taking agents for constipation regularly. He is concerned about the cause of his constipation. Patient has undergone multiple colonoscopies in the past. Most recently April 2015. He had a diminutive adenoma removed. Mild diverticulosis noted. No other abnormalities. Patient states that he otherwise feels well. He denies abdominal pain, change in appetite, weight loss, bleeding, or lethargy. Recently returned home from a vacation in [place].  REVIEW OF SYSTEMS:  All non-GI ROS negative except for sinus and allergy trouble, hearing difficulties, sleeping problems, nocturia  Past Medical History  Diagnosis Date  . History of BPH   . IBS (irritable bowel syndrome)   . HTN (hypertension)   . Anxiety   . Rhinitis   . Hyperlipidemia   . CAD (coronary artery disease)     a. s/p stent to LAD 2008;  b. abnormal ETT 11/13 with VTach => LHC pLAD 30% ISR, mCFX 30%, dRCA 60-75% which had progressed from previous study in 2010 => FFR of RCA 0.92 => med Rx   . Colon polyps   . Diverticular disease of colon   . Hemorrhoids     external and internal  . Hx of echocardiogram     a. Echo 9/11:  EF 55-60%, normal motion, grade 2 diastolic dysfunction, mild MR, mild LAE, mild RAE, PASP 33.  . Carotid stenosis     dopplers 1/14:  0-39% bilateral ICA stenosis  . Stroke, lacunar 08/24/2012    Old, 2011  head mri  . Colon polyps     adenomatous    Past Surgical History  Procedure Laterality Date  . Appendectomy  1956  . Nasal sinus surgery  1975  . Hernia repair Right 2005  . Coronary stent placement  2008     Successful PCI of the lesion in the proximal LAD using a Promus     Social History [name] [name]  reports that he has never smoked. He has never used smokeless tobacco. He reports that he drinks about 4.2 ounces of alcohol per week. He reports that he does not use illicit drugs.  family history includes Coronary artery disease in his father; Heart attack in his father; Rheum arthritis in his mother. There is no history of Colon cancer.  No Known Allergies     PHYSICAL EXAMINATION: Vital signs: BP 110/60  Pulse 66  Ht 5\' 10"  (1.778 m)  Wt 153 lb 3.2 oz (69.491 kg)  BMI 21.98 kg/m2 General: Well-developed, well-nourished, no acute distress HEENT: Sclerae are anicteric, conjunctiva pink. Oral mucosa intact Lungs: Clear Heart: Regular Abdomen: soft, nontender, nondistended, no obvious ascites, no peritoneal signs, normal bowel sounds. No organomegaly. Extremities: No edema Psychiatric: alert and oriented x3. Cooperative     ASSESSMENT:  #1. Functional constipation #2. History of adenomatous colon polyps. Last colonoscopy 2 months ago #3. General medical problems   PLAN:  #1. Educational discussion on constipation #2. Fiber supplementation with Metamucil (continued) #3. Okay to use Dulcolax as needed since  this works and is tolerated well. Multiple other options available if needed. #4. Resume general medical care with PCP. GI followup as needed. No future surveillance colonoscopy planned secondary to age and most recent findings

## 2013-10-26 NOTE — Patient Instructions (Signed)
Please follow up with Dr. Perry as needed 

## 2013-10-30 DIAGNOSIS — H0019 Chalazion unspecified eye, unspecified eyelid: Secondary | ICD-10-CM | POA: Diagnosis not present

## 2013-11-05 DIAGNOSIS — R351 Nocturia: Secondary | ICD-10-CM | POA: Diagnosis not present

## 2013-11-20 ENCOUNTER — Other Ambulatory Visit: Payer: Self-pay | Admitting: *Deleted

## 2013-11-20 MED ORDER — NITROGLYCERIN 0.4 MG SL SUBL: 0.4000 mg | SUBLINGUAL_TABLET | SUBLINGUAL | Status: DC | PRN

## 2013-11-21 ENCOUNTER — Encounter: Payer: Self-pay | Admitting: Internal Medicine

## 2013-11-21 ENCOUNTER — Ambulatory Visit (INDEPENDENT_AMBULATORY_CARE_PROVIDER_SITE_OTHER): Payer: Medicare Other | Admitting: Internal Medicine

## 2013-11-21 VITALS — BP 125/70 | HR 69 | Temp 98.3°F | Resp 16 | Ht 70.0 in | Wt 153.0 lb

## 2013-11-21 DIAGNOSIS — I2510 Atherosclerotic heart disease of native coronary artery without angina pectoris: Secondary | ICD-10-CM

## 2013-11-21 DIAGNOSIS — I6350 Cerebral infarction due to unspecified occlusion or stenosis of unspecified cerebral artery: Secondary | ICD-10-CM | POA: Diagnosis not present

## 2013-11-21 DIAGNOSIS — I472 Ventricular tachycardia: Secondary | ICD-10-CM | POA: Diagnosis not present

## 2013-11-21 DIAGNOSIS — I4729 Other ventricular tachycardia: Secondary | ICD-10-CM | POA: Diagnosis not present

## 2013-11-21 DIAGNOSIS — I10 Essential (primary) hypertension: Secondary | ICD-10-CM | POA: Diagnosis not present

## 2013-11-21 DIAGNOSIS — I6381 Other cerebral infarction due to occlusion or stenosis of small artery: Secondary | ICD-10-CM

## 2013-11-21 NOTE — Assessment & Plan Note (Signed)
His BP is well controlled 

## 2013-11-21 NOTE — Patient Instructions (Signed)
Transient Ischemic Attack A transient ischemic attack (TIA) is a "warning stroke" that causes stroke-like symptoms. Unlike a stroke, a TIA does not cause permanent damage to the brain. The symptoms of a TIA can happen very fast and do not last long. It is important to know the symptoms of a TIA and what to do. This can help prevent a major stroke or death. CAUSES   A TIA is caused by a temporary blockage in an artery in the brain or neck (carotid artery). The blockage does not allow the brain to get the blood supply it needs and can cause different symptoms. The blockage can be caused by either:  A blood clot.  Fatty buildup (plaque) in a neck or brain artery. RISK FACTORS  High blood pressure (hypertension).  High cholesterol.  Diabetes mellitus.  Heart disease.  The build up of plaque in the blood vessels (peripheral artery disease or atherosclerosis).  The build up of plaque in the blood vessels providing blood and oxygen to the brain (carotid artery stenosis).  An abnormal heart rhythm (atrial fibrillation).  Obesity.  Smoking.  Taking oral contraceptives (especially in combination with smoking).  Physical inactivity.  A diet high in fats, salt (sodium), and calories.  Alcohol use.  Use of illegal drugs (especially cocaine and methamphetamine).  Being male.  Being African American.  Being over the age of 70.  Family history of stroke.  Previous history of blood clots, stroke, TIA, or heart attack.  Sickle cell disease. SYMPTOMS  TIA symptoms are the same as a stroke but are temporary. These symptoms usually develop suddenly, or may be newly present upon awakening from sleep:  Sudden weakness or numbness of the face, arm, or leg, especially on one side of the body.  Sudden trouble walking or difficulty moving arms or legs.  Sudden confusion.  Sudden personality changes.  Trouble speaking (aphasia) or understanding.  Difficulty swallowing.  Sudden  trouble seeing in one or both eyes.  Double vision.  Dizziness.  Loss of balance or coordination.  Sudden severe headache with no known cause.  Trouble reading or writing.  Loss of bowel or bladder control.  Loss of consciousness. DIAGNOSIS  Your caregiver may be able to determine the presence or absence of a TIA based on your symptoms, history, and physical exam. Computed tomography (CT scan) of the brain is usually performed to help identify a TIA. Other tests may be done to diagnose a TIA. These tests may include:  Electrocardiography.  Continuous heart monitoring.  Echocardiography.  Carotid ultrasonography.  Magnetic resonance imaging (MRI).  A scan of the brain circulation.  Blood tests. PREVENTION  The risk of a TIA can be decreased by appropriately treating high blood pressure, high cholesterol, diabetes, heart disease, and obesity and by quitting smoking, limiting alcohol, and staying physically active. TREATMENT  Time is of the essence. Since the symptoms of TIA are the same as a stroke, it is important to seek treatment as soon as possible because you may need a medicine to dissolve the clot (thrombolytic) that cannot be given if too much time has passed. Treatment options vary. Treatment options may include rest, oxygen, intravenous (IV) fluids, and medicines to thin the blood (anticoagulants). Medicines and diet may be used to address diabetes, high blood pressure, and other risk factors. Measures will be taken to prevent short-term and long-term complications, including infection from breathing foreign material into the lungs (aspiration pneumonia), blood clots in the legs, and falls. Treatment options include procedures  to either remove plaque in the carotid arteries or dilate carotid arteries that have narrowed due to plaque. Those procedures are:  Carotid endarterectomy.  Carotid angioplasty and stenting. HOME CARE INSTRUCTIONS   Take all medicines prescribed  by your caregiver. Follow the directions carefully. Medicines may be used to control risk factors for a stroke. Be sure you understand all your medicine instructions.  You may be told to take aspirin or the anticoagulant warfarin. Warfarin needs to be taken exactly as instructed.  Taking too much or too little warfarin is dangerous. Too much warfarin increases the risk of bleeding. Too little warfarin continues to allow the risk for blood clots. While taking warfarin, you will need to have regular blood tests to measure your blood clotting time. A PT blood test measures how long it takes for blood to clot. Your PT is used to calculate another value called an INR. Your PT and INR help your caregiver to adjust your dose of warfarin. The dose can change for many reasons. It is critically important that you take warfarin exactly as prescribed.  Many foods, especially foods high in vitamin K can interfere with warfarin and affect the PT and INR. Foods high in vitamin K include spinach, kale, broccoli, cabbage, collard and turnip greens, brussels sprouts, peas, cauliflower, seaweed, and parsley as well as beef and pork liver, green tea, and soybean oil. You should eat a consistent amount of foods high in vitamin K. Avoid major changes in your diet, or notify your caregiver before changing your diet. Arrange a visit with a dietitian to answer your questions.  Many medicines can interfere with warfarin and affect the PT and INR. You must tell your caregiver about any and all medicines you take, this includes all vitamins and supplements. Be especially cautious with aspirin and anti-inflammatory medicines. Do not take or discontinue any prescribed or over-the-counter medicine except on the advice of your caregiver or pharmacist.  Warfarin can have side effects, such as excessive bruising or bleeding. You will need to hold pressure over cuts for longer than usual. Your caregiver or pharmacist will discuss other  potential side effects.  Avoid sports or activities that may cause injury or bleeding.  Be mindful when shaving, flossing your teeth, or handling sharp objects.  Alcohol can change the body's ability to handle warfarin. It is best to avoid alcoholic drinks or consume only very small amounts while taking warfarin. Notify your caregiver if you change your alcohol intake.  Notify your dentist or other caregivers before procedures.  Eat a diet that includes 5 or more servings of fruits and vegetables each day. This may reduce the risk of stroke. Certain diets may be prescribed to address high blood pressure, high cholesterol, diabetes, or obesity.  A low-sodium, low-saturated fat, low-trans fat, low-cholesterol diet is recommended to manage high blood pressure.  A low-saturated fat, low-trans fat, low-cholesterol, and high-fiber diet may control cholesterol levels.  A controlled-carbohydrate, controlled-sugar diet is recommended to manage diabetes.  A reduced-calorie, low-sodium, low-saturated fat, low-trans fat, low-cholesterol diet is recommended to manage obesity.  Maintain a healthy weight.  Stay physically active. It is recommended that you get at least 30 minutes of activity on most or all days.  Do not smoke.  Limit alcohol use even if you are not taking warfarin. Moderate alcohol use is considered to be:  No more than 2 drinks each day for men.  No more than 1 drink each day for nonpregnant women.  Stop drug abuse.  Maintain a healthy weight.   Stay physically active. It is recommended that you get at least 30 minutes of activity on most or all days.   Do not smoke.   Limit alcohol use even if you are not taking warfarin. Moderate alcohol use is considered to be:   No more than 2 drinks each day for men.   No more than 1 drink each day for nonpregnant women.   Stop drug abuse.   Home safety. A safe home environment is important to reduce the risk of falls. Your caregiver may arrange for specialists to evaluate your home. Having grab bars in the bedroom and bathroom is often important. Your caregiver may arrange for equipment to be used at home, such as raised toilets and a seat for the shower.   Follow all instructions for follow-up with your caregiver. This is very important. This includes any referrals and lab tests. Proper follow up can  prevent a stroke or another TIA from occurring.  SEEK MEDICAL CARE IF:   You have personality changes.   You have difficulty swallowing.   You are seeing double.   You have dizziness.   You have a fever.   You have skin breakdown.  SEEK IMMEDIATE MEDICAL CARE IF:   Any of these symptoms may represent a serious problem that is an emergency. Do not wait to see if the symptoms will go away. Get medical help right away. Call your local emergency services (911 in [place] Do not drive yourself to the hospital.   You have sudden weakness or numbness of the face, arm, or leg, especially on one side of the body.   You have sudden trouble walking or difficulty moving arms or legs.   You have sudden confusion.   You have trouble speaking (aphasia) or understanding.   You have sudden trouble seeing in one or both eyes.   You have a loss of balance or coordination.   You have a sudden, severe headache with no known cause.   You have new chest pain or an irregular heartbeat.   You have a partial or total loss of consciousness.  MAKE SURE YOU:    Understand these instructions.   Will watch your condition.   Will get help right away if you are not doing well or get worse.  Document Released: 02/10/2005 Document Revised: 05/08/2013 Document Reviewed: 06/26/2009  [employer] Patient Information 2015 [employer]. This information is not intended to replace advice given to you by your health care provider. Make sure you discuss any questions you have with your health care provider.
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
## 2013-11-21 NOTE — Assessment & Plan Note (Signed)
I have asked him to have an event monitor placed to see if a dysrhythmia was the cause of his recent event

## 2013-11-21 NOTE — Progress Notes (Signed)
Pre visit review using our clinic review tool, if applicable. No additional management support is needed unless otherwise documented below in the visit note. 

## 2013-11-21 NOTE — Assessment & Plan Note (Addendum)
The only abnormal neuro finding is the positive babinski on the left He has had a new event (syncope) but no sequelae, I will recheck his MRI to see if there has been a change. If there has, then will evaluate him for a TE event, for now my main concern wound be his carotid arteries. He will cont taking his current meds to reduce the risk that he may have another CVA.

## 2013-11-23 ENCOUNTER — Ambulatory Visit: Payer: TRICARE For Life (TFL) | Admitting: Internal Medicine

## 2013-12-03 ENCOUNTER — Ambulatory Visit
Admission: RE | Admit: 2013-12-03 | Discharge: 2013-12-03 | Disposition: A | Discharge status: Home / Self Care | Payer: Medicare Other | Source: Ambulatory Visit | Attending: Internal Medicine | Admitting: Internal Medicine

## 2013-12-03 DIAGNOSIS — I6381 Other cerebral infarction due to occlusion or stenosis of small artery: Secondary | ICD-10-CM

## 2013-12-03 DIAGNOSIS — R55 Syncope and collapse: Secondary | ICD-10-CM | POA: Diagnosis not present

## 2013-12-05 DIAGNOSIS — Z85828 Personal history of other malignant neoplasm of skin: Secondary | ICD-10-CM | POA: Diagnosis not present

## 2013-12-05 DIAGNOSIS — L570 Actinic keratosis: Secondary | ICD-10-CM | POA: Diagnosis not present

## 2013-12-05 DIAGNOSIS — Z85820 Personal history of malignant melanoma of skin: Secondary | ICD-10-CM | POA: Diagnosis not present

## 2013-12-09 ENCOUNTER — Encounter: Payer: Self-pay | Admitting: Internal Medicine

## 2013-12-12 ENCOUNTER — Encounter: Payer: Self-pay | Admitting: Internal Medicine

## 2013-12-12 ENCOUNTER — Ambulatory Visit (INDEPENDENT_AMBULATORY_CARE_PROVIDER_SITE_OTHER): Payer: Medicare Other | Admitting: Internal Medicine

## 2013-12-12 VITALS — BP 120/72 | HR 65 | Temp 98.4°F | Ht 70.0 in | Wt 154.0 lb

## 2013-12-12 DIAGNOSIS — I10 Essential (primary) hypertension: Secondary | ICD-10-CM | POA: Diagnosis not present

## 2013-12-12 DIAGNOSIS — I6350 Cerebral infarction due to unspecified occlusion or stenosis of unspecified cerebral artery: Secondary | ICD-10-CM | POA: Diagnosis not present

## 2013-12-12 DIAGNOSIS — I2510 Atherosclerotic heart disease of native coronary artery without angina pectoris: Secondary | ICD-10-CM

## 2013-12-12 DIAGNOSIS — R0989 Other specified symptoms and signs involving the circulatory and respiratory systems: Secondary | ICD-10-CM

## 2013-12-12 DIAGNOSIS — I6381 Other cerebral infarction due to occlusion or stenosis of small artery: Secondary | ICD-10-CM

## 2013-12-12 DIAGNOSIS — E785 Hyperlipidemia, unspecified: Secondary | ICD-10-CM

## 2013-12-12 NOTE — Assessment & Plan Note (Signed)
His BP is well controlled 

## 2013-12-12 NOTE — Progress Notes (Signed)
Pre visit review using our clinic review tool, if applicable. No additional management support is needed unless otherwise documented below in the visit note. 

## 2013-12-12 NOTE — Progress Notes (Signed)
Subjective:    Patient ID: [name] [name], male    DOB: [birth year], 80 y.o.   MRN: [id number]  HPI Comments: For the last month he complains of episodes of feeling like his neck is "closing in" on him and his vision becoming tunneled while he is watching TV - this has only occurred while watching TV.  Hypertension This is a chronic problem. The current episode started more than 1 year ago. The problem is unchanged. The problem is controlled. Associated symptoms include anxiety. Pertinent negatives include no blurred vision, chest pain, headaches, neck pain, orthopnea, palpitations, peripheral edema, PND, shortness of breath or sweats. Past treatments include ACE inhibitors and beta blockers. Hypertensive end-organ damage includes CAD/MI and CVA.      Review of Systems  Constitutional: Negative.  Negative for fever, chills, diaphoresis, activity change, appetite change and fatigue.  HENT: Negative.   Eyes: Positive for visual disturbance. Negative for blurred vision, photophobia, pain, discharge, redness and itching.  Respiratory: Negative.  Negative for shortness of breath.   Cardiovascular: Negative.  Negative for chest pain, palpitations, orthopnea, leg swelling and PND.  Gastrointestinal: Negative.  Negative for nausea, vomiting, abdominal pain, diarrhea, constipation and blood in stool.  Endocrine: Negative.   Genitourinary: Negative.   Musculoskeletal: Negative.  Negative for neck pain.  Skin: Negative.  Negative for rash.  Allergic/Immunologic: Negative.   Neurological: Negative.  Negative for dizziness, tremors, seizures, syncope, facial asymmetry, speech difficulty, weakness, light-headedness, numbness and headaches.  Hematological: Negative.  Negative for adenopathy. Does not bruise/bleed easily.  Psychiatric/Behavioral: Positive for sleep disturbance. Negative for suicidal ideas, hallucinations, behavioral problems, confusion, self-injury, dysphoric mood, decreased  concentration and agitation. The patient is nervous/anxious. The patient is not hyperactive.        Objective:   Physical Exam  Vitals reviewed. Constitutional: He is oriented to person, place, and time. He appears well-developed and well-nourished. No distress.  HENT:  Head: Normocephalic and atraumatic.  Mouth/Throat: Oropharynx is clear and moist. No oropharyngeal exudate.  Eyes: Conjunctivae and EOM are normal. Pupils are equal, round, and reactive to light. Right eye exhibits no discharge. Left eye exhibits no discharge. No scleral icterus.  Neck: Trachea normal and normal range of motion. Neck supple. Normal carotid pulses, no hepatojugular reflux and no JVD present. Carotid bruit is present. No tracheal deviation present. No mass and no thyromegaly present.  Cardiovascular: Normal rate, regular rhythm, normal heart sounds and intact distal pulses.  Exam reveals no gallop and no friction rub.   No murmur heard. Pulmonary/Chest: Effort normal and breath sounds normal. No stridor. No respiratory distress. He has no wheezes. He has no rales. He exhibits no tenderness.  Abdominal: Soft. Bowel sounds are normal. He exhibits no distension and no mass. There is no tenderness. There is no rebound and no guarding.  Musculoskeletal: Normal range of motion. He exhibits no edema and no tenderness.  Lymphadenopathy:    He has no cervical adenopathy.  Neurological: He is alert and oriented to person, place, and time. He has normal reflexes. He displays normal reflexes. No cranial nerve deficit. He exhibits normal muscle tone. Coordination normal.  Skin: Skin is warm and dry. No rash noted. He is not diaphoretic. No erythema. No pallor.  Psychiatric: He has a normal mood and affect. His behavior is normal. Judgment and thought content normal.     Lab Results  Component Value Date   WBC 5.6 04/27/2012   HGB 13.9 04/27/2012   HCT 41.0 04/27/2012  PLT 195.0 04/27/2012   GLUCOSE 95 06/26/2013    CHOL 129 06/26/2013   TRIG 93.0 06/26/2013   HDL 66.70 06/26/2013   LDLDIRECT 132.2 03/10/2007   LDLCALC 44 06/26/2013   ALT 26 05/10/2011   ALT 26 05/10/2011   AST 26 05/10/2011   AST 26 05/10/2011   NA 140 06/26/2013   K 3.9 06/26/2013   CL 109 06/26/2013   CREATININE 1.0 06/26/2013   BUN 16 06/26/2013   CO2 24 06/26/2013   TSH 2.94 03/31/2012   PSA 0.44 05/10/2011   INR 1.0 04/27/2012       Assessment & Plan:

## 2013-12-12 NOTE — Assessment & Plan Note (Signed)
MRI shows small vessel disease but it is otherwise improved

## 2013-12-12 NOTE — Assessment & Plan Note (Signed)
I have ordered carotid duplex scans to see if there is atherosclerosis that is causing his symptoms

## 2013-12-13 DIAGNOSIS — M6530 Trigger finger, unspecified finger: Secondary | ICD-10-CM | POA: Diagnosis not present

## 2013-12-26 ENCOUNTER — Encounter: Payer: Self-pay | Admitting: Radiology

## 2013-12-26 ENCOUNTER — Encounter (INDEPENDENT_AMBULATORY_CARE_PROVIDER_SITE_OTHER): Payer: Medicare Other

## 2013-12-26 DIAGNOSIS — I4729 Other ventricular tachycardia: Secondary | ICD-10-CM

## 2013-12-26 DIAGNOSIS — I6350 Cerebral infarction due to unspecified occlusion or stenosis of unspecified cerebral artery: Secondary | ICD-10-CM

## 2013-12-26 DIAGNOSIS — I6381 Other cerebral infarction due to occlusion or stenosis of small artery: Secondary | ICD-10-CM

## 2013-12-26 DIAGNOSIS — I472 Ventricular tachycardia: Secondary | ICD-10-CM | POA: Diagnosis not present

## 2013-12-26 NOTE — Progress Notes (Signed)
Patient ID: [name] [name], male   DOB: 11-12-1932, 80 y.o.   MRN: [id number] E cardio 30 day monitor applied. EOS 01-25-14

## 2014-01-08 ENCOUNTER — Other Ambulatory Visit: Payer: Self-pay | Admitting: Internal Medicine

## 2014-01-08 DIAGNOSIS — R0989 Other specified symptoms and signs involving the circulatory and respiratory systems: Secondary | ICD-10-CM

## 2014-01-10 ENCOUNTER — Ambulatory Visit (HOSPITAL_COMMUNITY): Payer: Medicare Other | Attending: Cardiology | Admitting: Cardiology

## 2014-01-10 DIAGNOSIS — I658 Occlusion and stenosis of other precerebral arteries: Secondary | ICD-10-CM | POA: Insufficient documentation

## 2014-01-10 DIAGNOSIS — I2510 Atherosclerotic heart disease of native coronary artery without angina pectoris: Secondary | ICD-10-CM | POA: Insufficient documentation

## 2014-01-10 DIAGNOSIS — I6529 Occlusion and stenosis of unspecified carotid artery: Secondary | ICD-10-CM | POA: Diagnosis not present

## 2014-01-10 DIAGNOSIS — I10 Essential (primary) hypertension: Secondary | ICD-10-CM | POA: Insufficient documentation

## 2014-01-10 DIAGNOSIS — R0989 Other specified symptoms and signs involving the circulatory and respiratory systems: Secondary | ICD-10-CM

## 2014-01-10 DIAGNOSIS — H53123 Transient visual loss, bilateral: Secondary | ICD-10-CM

## 2014-01-10 DIAGNOSIS — H53129 Transient visual loss, unspecified eye: Secondary | ICD-10-CM | POA: Insufficient documentation

## 2014-01-10 DIAGNOSIS — E785 Hyperlipidemia, unspecified: Secondary | ICD-10-CM | POA: Diagnosis not present

## 2014-01-10 NOTE — Progress Notes (Signed)
Carotid duplex performed 

## 2014-01-23 ENCOUNTER — Other Ambulatory Visit: Payer: Self-pay | Admitting: Geriatric Medicine

## 2014-02-01 ENCOUNTER — Telehealth: Payer: Self-pay | Admitting: Internal Medicine

## 2014-02-01 NOTE — Telephone Encounter (Signed)
Would like a call back in regards to if he would get results from heart monitor.  Patient would like you to try home and cell.

## 2014-02-01 NOTE — Telephone Encounter (Signed)
There does not appear to be a report on this, he should call the cardiology dept and ask them if it has been reviewed yet

## 2014-02-01 NOTE — Telephone Encounter (Signed)
See mychart.  

## 2014-02-06 ENCOUNTER — Telehealth: Payer: Self-pay

## 2014-02-06 ENCOUNTER — Other Ambulatory Visit: Payer: Self-pay | Admitting: Dermatology

## 2014-02-06 DIAGNOSIS — Z85820 Personal history of malignant melanoma of skin: Secondary | ICD-10-CM | POA: Diagnosis not present

## 2014-02-06 DIAGNOSIS — Z85828 Personal history of other malignant neoplasm of skin: Secondary | ICD-10-CM | POA: Diagnosis not present

## 2014-02-06 DIAGNOSIS — D1801 Hemangioma of skin and subcutaneous tissue: Secondary | ICD-10-CM | POA: Diagnosis not present

## 2014-02-06 DIAGNOSIS — D485 Neoplasm of uncertain behavior of skin: Secondary | ICD-10-CM | POA: Diagnosis not present

## 2014-02-06 DIAGNOSIS — L570 Actinic keratosis: Secondary | ICD-10-CM | POA: Diagnosis not present

## 2014-02-06 DIAGNOSIS — L821 Other seborrheic keratosis: Secondary | ICD-10-CM | POA: Diagnosis not present

## 2014-02-06 DIAGNOSIS — D235 Other benign neoplasm of skin of trunk: Secondary | ICD-10-CM | POA: Diagnosis not present

## 2014-02-06 MED ORDER — ZOLPIDEM TARTRATE 10 MG PO TABS: 10.0000 mg | ORAL_TABLET | Freq: Every evening | ORAL | Status: DC | PRN

## 2014-02-06 NOTE — Telephone Encounter (Signed)
Refill request for zolpidem 10 mg qty 90 w/ 5 rfs, refill done

## 2014-02-07 ENCOUNTER — Telehealth: Payer: Self-pay | Admitting: *Deleted

## 2014-02-07 NOTE — Telephone Encounter (Signed)
Pt  Aware OF MONITOR RESULTS  PER  DR Johnsie Cancel   NSR /PAC  NO  SIG  ARRHYTHMIAS./CY

## 2014-02-18 ENCOUNTER — Other Ambulatory Visit: Payer: Self-pay

## 2014-02-18 MED ORDER — ZOLPIDEM TARTRATE 10 MG PO TABS: 10.0000 mg | ORAL_TABLET | Freq: Every evening | ORAL | Status: DC | PRN

## 2014-02-21 ENCOUNTER — Other Ambulatory Visit: Payer: Self-pay | Admitting: *Deleted

## 2014-02-21 MED ORDER — PANTOPRAZOLE SODIUM 40 MG PO TBEC: 40.0000 mg | DELAYED_RELEASE_TABLET | Freq: Every day | ORAL | Status: DC

## 2014-02-28 DIAGNOSIS — H5000 Unspecified esotropia: Secondary | ICD-10-CM | POA: Diagnosis not present

## 2014-02-28 DIAGNOSIS — H2510 Age-related nuclear cataract, unspecified eye: Secondary | ICD-10-CM | POA: Diagnosis not present

## 2014-03-01 DIAGNOSIS — J310 Chronic rhinitis: Secondary | ICD-10-CM | POA: Diagnosis not present

## 2014-03-01 DIAGNOSIS — J343 Hypertrophy of nasal turbinates: Secondary | ICD-10-CM | POA: Diagnosis not present

## 2014-03-11 ENCOUNTER — Other Ambulatory Visit: Payer: Self-pay | Admitting: Cardiovascular Disease

## 2014-04-18 ENCOUNTER — Encounter: Payer: Self-pay | Admitting: Cardiovascular Disease

## 2014-04-18 ENCOUNTER — Ambulatory Visit (INDEPENDENT_AMBULATORY_CARE_PROVIDER_SITE_OTHER): Payer: Medicare Other | Admitting: Cardiovascular Disease

## 2014-04-18 VITALS — BP 130/70 | HR 68 | Ht 70.0 in | Wt 151.1 lb

## 2014-04-18 DIAGNOSIS — I472 Ventricular tachycardia: Secondary | ICD-10-CM

## 2014-04-18 DIAGNOSIS — I2510 Atherosclerotic heart disease of native coronary artery without angina pectoris: Secondary | ICD-10-CM | POA: Diagnosis not present

## 2014-04-18 DIAGNOSIS — I10 Essential (primary) hypertension: Secondary | ICD-10-CM | POA: Diagnosis not present

## 2014-04-18 DIAGNOSIS — R079 Chest pain, unspecified: Secondary | ICD-10-CM | POA: Diagnosis not present

## 2014-04-18 DIAGNOSIS — I4729 Other ventricular tachycardia: Secondary | ICD-10-CM

## 2014-04-18 DIAGNOSIS — R0989 Other specified symptoms and signs involving the circulatory and respiratory systems: Secondary | ICD-10-CM

## 2014-04-18 DIAGNOSIS — E785 Hyperlipidemia, unspecified: Secondary | ICD-10-CM

## 2014-04-18 MED ORDER — METOPROLOL SUCCINATE ER 25 MG PO TB24: 25.0000 mg | ORAL_TABLET | Freq: Every day | ORAL | Status: DC

## 2014-04-18 MED ORDER — ROSUVASTATIN CALCIUM 40 MG PO TABS: 40.0000 mg | ORAL_TABLET | Freq: Every day | ORAL | Status: DC

## 2014-04-18 MED ORDER — LISINOPRIL 10 MG PO TABS: 10.0000 mg | ORAL_TABLET | Freq: Every day | ORAL | Status: DC

## 2014-04-18 NOTE — Patient Instructions (Signed)
Your physician wants you to follow-up in:   6  MONTHS WITH DR NISHAN  You will receive a reminder letter in the mail two months in advance. If you don't receive a letter, please call our office to schedule the follow-up appointment. Your physician recommends that you continue on your current medications as directed. Please refer to the Current Medication list given to you today. Your physician has requested that you have en exercise stress myoview. For further information please visit [email or web address]. Please follow instruction sheet, as given.  

## 2014-04-18 NOTE — Progress Notes (Signed)
Patient ID: [name] [name], male   DOB: Jan 24, 1933, 80 y.o.   MRN: [id number] He has a hx of CAD, s/p stent to the LAD in 2008, HTN, HL, IBS, anxiety. Echo 9/11: EF 55-60%, normal motion, grade 2 diastolic dysfunction, mild MR, mild LAE, mild RAE, PASP 33. He saw me  in 03/2012 and noted throat pain intermittently. He was set up for an ETT. ETT 04/27/12: Abnormal with episodes of ventricular tachycardia-longest 18 beats. He was set up for cardiac catheterization. LHC 05/01/12: pLAD 30% ISR, mCFX 30%, dRCA 60-75% which had progressed from previous study in 2010. Patient was loaded with Plavix and brought back 12/18 for pressure wire analysis. FFR at peak hyperemia was 0.92 which was felt to be hemodynamically insignificant. Continued medical therapy was recommended. He was placed on Toprol and his Plavix was stopped.   Doing well since being on Toprol mild exertional dyspnea and occasional chest pressure when walking at beach  Previous visual issues turned out to be occular migrains  Needs lisinopril and crestor refilled Will have labs with Dr Crissie Sickles in January Has a new grand daughter in [place]   8/15  1-39% bilateral carotid disease    ROS: Denies fever, malais, weight loss, blurry vision, decreased visual acuity, cough, sputum, SOB, hemoptysis, pleuritic pain, palpitaitons, heartburn, abdominal pain, melena, lower extremity edema, claudication, or rash.  All other systems reviewed and negative  General: Affect appropriate Healthy:  appears stated age 47: normal Neck supple with no adenopathy JVP normal no bruits no thyromegaly Lungs clear with no wheezing and good diaphragmatic motion Heart:  S1/S2 no murmur, no rub, gallop or click PMI normal Abdomen: benighn, BS positve, no tenderness, no AAA no bruit.  No HSM or HJR Distal pulses intact with no bruits No edema Neuro non-focal Skin warm and dry No muscular weakness   Current Outpatient Prescriptions  Medication Sig Dispense  Refill  . ALPRAZolam (XANAX) 0.25 MG tablet take 1/2 to 1 tablet by mouth at bedtime if needed for sleep 30 tablet 0  . aspirin 81 MG tablet Take 81 mg by mouth daily.      . Bisacodyl (DULCOLAX PO) Take by mouth. Take one Sunday AM and Weds AM for constipation    . CRESTOR 40 MG tablet TAKE 1 TABLET DAILY 90 tablet 0  . dutasteride (AVODART) 0.5 MG capsule Take 0.5 mg by mouth daily.    . fluticasone (FLONASE) 50 MCG/ACT nasal spray daily. 1-2 SPRAYS IN EACH NOSTRIL  0  . lisinopril (PRINIVIL,ZESTRIL) 10 MG tablet TAKE 1 TABLET DAILY 90 tablet 0  . metoprolol succinate (TOPROL-XL) 25 MG 24 hr tablet TAKE 1 TABLET DAILY 90 tablet 3  . pantoprazole (PROTONIX) 40 MG tablet Take 1 tablet (40 mg total) by mouth daily. 90 tablet 3  . Psyllium (MEDI-MUCIL PO) Take 240 g by mouth daily. Every morning     . zolpidem (AMBIEN) 10 MG tablet Take 1 tablet (10 mg total) by mouth at bedtime as needed. For sleep 30 tablet 5  . nitroGLYCERIN (NITROSTAT) 0.4 MG SL tablet Place 1 tablet (0.4 mg total) under the tongue every 5 (five) minutes as needed. For shortness of breath (Patient not taking: Reported on 04/18/2014) 25 tablet 3   No current facility-administered medications for this visit.    Allergies  Review of patient's allergies indicates no known allergies.  Electrocardiogram:  SR rate 53  ICRBBB old IMI possible nonspecific ST T wave changes  no change from 2014   Assessment  and Plan

## 2014-04-18 NOTE — Assessment & Plan Note (Signed)
Cannot hear today dupolex 8/15 1-39% stenosis fl/u duplex 8/17  ASA

## 2014-04-18 NOTE — Assessment & Plan Note (Signed)
Cholesterol is at goal.  Continue current dose of statin and diet Rx.  No myalgias or side effects.  F/U  LFT's in 6 months. Lab Results  Component Value Date   LDLCALC 44 06/26/2013

## 2014-04-18 NOTE — Assessment & Plan Note (Signed)
Cath 2013 patent stent LAD 60% distal RCA some exertional symptoms Baseline ECG abnormal f/u stress myovue

## 2014-04-18 NOTE — Assessment & Plan Note (Signed)
Well controlled.  Continue current medications and low sodium Dash type diet.    

## 2014-04-19 ENCOUNTER — Encounter: Payer: Self-pay | Admitting: Cardiovascular Disease

## 2014-04-23 ENCOUNTER — Ambulatory Visit (HOSPITAL_COMMUNITY): Payer: Medicare Other | Attending: Cardiology | Admitting: Radiology

## 2014-04-23 DIAGNOSIS — I472 Ventricular tachycardia: Secondary | ICD-10-CM | POA: Diagnosis not present

## 2014-04-23 DIAGNOSIS — R079 Chest pain, unspecified: Secondary | ICD-10-CM | POA: Diagnosis not present

## 2014-04-23 MED ORDER — TECHNETIUM TC 99M SESTAMIBI GENERIC - CARDIOLITE
33.0000 | Freq: Once | INTRAVENOUS | Status: AC | PRN
  Administered 2014-04-23: 33 via INTRAVENOUS

## 2014-04-23 MED ORDER — TECHNETIUM TC 99M SESTAMIBI GENERIC - CARDIOLITE
11.0000 | Freq: Once | INTRAVENOUS | Status: AC | PRN
  Administered 2014-04-23: 11 via INTRAVENOUS

## 2014-04-23 NOTE — Progress Notes (Signed)
[facility] [address] [place], [place] [address] [phone]    Cardiology Nuclear Med Study  PRANAY HILBUN is a 80 y.o. male     MRN : [id number]     DOB: August 13, 1932  Procedure Date: 04/23/2014  Nuclear Med Background Indication for Stress Test:  Evaluation for Ischemia, Stent Patency and Abnormal EKG History:  CAD, MPI 2010 (normal) EF 53% Cardiac Risk Factors: Carotid Disease and Hypertension  Symptoms:  Chest Pain with Exertion and DOE   Nuclear Pre-Procedure Caffeine/Decaff Intake:  None>12 hrs NPO After: 8:30pm   Lungs:  clear O2 Sat: 96% on room air. IV 0.9% NS with Angio Cath:  22g  IV Site: R Antecubital x 1, tolerated well IV Started by:  Irven Baltimore, RN  Chest Size (in):  38 Cup Size: n/a  Height: 5\' 10"  (1.778 m)  Weight:  148 lb (67.132 kg)  BMI:  Body mass index is 21.24 kg/(m^2). Tech Comments:  Patient took Lisinopril, Avodart, and Toprol this am per MD. Irven Baltimore, RN.    Nuclear Med Study 1 or 2 day study: 1 day  Stress Test Type:  Stress  Reading MD: N/A  Order Authorizing Provider:  [name], MD  Resting Radionuclide: Technetium 46m Sestamibi  Resting Radionuclide Dose: 11.0 mCi   Stress Radionuclide:  Technetium 8m Sestamibi  Stress Radionuclide Dose: 33.0 mCi           Stress Protocol Rest HR: 64 Stress HR: 133  Rest BP: 129/79 Stress BP: 201/54  Exercise Time (min): 9:00 METS: 10.1   Predicted Max HR: 142 bpm % Max HR: 93.66 bpm Rate Pressure Product: 26733   Dose of Adenosine (mg):  n/a Dose of Lexiscan: n/a mg  Dose of Atropine (mg): n/a Dose of Dobutamine: n/a mcg/kg/min (at max HR)  Stress Test Technologist: Glade Lloyd, BS-ES  Nuclear Technologist:  Earl Many, CNMT     Rest Procedure:  Myocardial perfusion imaging was performed at rest 45 minutes following the intravenous administration of Technetium 42m Sestamibi. Rest ECG: Sinus rhythm, 64, incomplete right bundle branch block,  nonspecific T-wave flattening.  Stress Procedure:  The patient exercised on the treadmill utilizing the Bruce Protocol for 9:00 minutes. The patient stopped due to fatigue and denied any chest pain.  Technetium 55m Sestamibi was injected at peak exercise and myocardial perfusion imaging was performed after a brief delay. Stress ECG: Occasional PAC. Occasional PVC. No ST segment changes suggestive of ischemia.  QPS Raw Data Images:  Normal; no motion artifact; normal heart/lung ratio. Stress Images:  There is mild decreased apical uptake seen at both rest and stress. There is basal septal decreased uptake slightly worse at stress than at rest. There is also basal inferolateral defect seen at both rest and stress, slightly worse at stress. A mild degree of ischemia cannot be excluded. Otherwise, homogeneous radiotracer uptake. Rest Images:  As above Subtraction (SDS):  2, a mild degree of ischemia cannot be excluded. Transient Ischemic Dilatation (Normal <1.22):  0.98 Lung/Heart Ratio (Normal <0.45):  0.28  Quantitative Gated Spect Images QGS EDV:  94 ml QGS ESV:  44 ml  Impression Exercise Capacity:  Good exercise capacity. BP Response:  Normal blood pressure response. Clinical Symptoms:  No significant symptoms noted. ECG Impression:  No significant ST segment change suggestive of ischemia. occasional PVCs noted Comparison with Prior Nuclear Study: No images to compare  Overall Impression:  Low risk stress nuclear study with mild basal septal  as well as basal inferolateral defect seen at both rest and stress with associated minimal ischemia.   LV Ejection Fraction: 54%.  LV Wall Motion:  NL LV Function; NL Wall Motion   LHC 05/01/12: pLAD 30% ISR, mCFX 30%, dRCA 60-75% which had progressed from previous study in 2010. Patient was loaded with Plavix and brought back 12/18 for pressure wire analysis. FFR at peak hyperemia was 0.92 which was felt to be hemodynamically  insignificant.  Candee Furbish, MD

## 2014-04-24 ENCOUNTER — Other Ambulatory Visit: Payer: Self-pay | Admitting: *Deleted

## 2014-04-25 ENCOUNTER — Encounter (HOSPITAL_COMMUNITY): Payer: Self-pay | Admitting: Cardiovascular Disease

## 2014-06-27 ENCOUNTER — Encounter: Payer: Medicare Other | Admitting: Internal Medicine

## 2014-07-02 ENCOUNTER — Encounter: Payer: Self-pay | Admitting: Internal Medicine

## 2014-07-02 ENCOUNTER — Other Ambulatory Visit (INDEPENDENT_AMBULATORY_CARE_PROVIDER_SITE_OTHER): Payer: Medicare Other

## 2014-07-02 ENCOUNTER — Ambulatory Visit (INDEPENDENT_AMBULATORY_CARE_PROVIDER_SITE_OTHER): Payer: Medicare Other | Admitting: Internal Medicine

## 2014-07-02 VITALS — BP 118/72 | HR 73 | Temp 98.4°F | Resp 16 | Ht 70.0 in | Wt 148.0 lb

## 2014-07-02 DIAGNOSIS — J302 Other seasonal allergic rhinitis: Secondary | ICD-10-CM | POA: Diagnosis not present

## 2014-07-02 DIAGNOSIS — I10 Essential (primary) hypertension: Secondary | ICD-10-CM | POA: Diagnosis not present

## 2014-07-02 DIAGNOSIS — I25118 Atherosclerotic heart disease of native coronary artery with other forms of angina pectoris: Secondary | ICD-10-CM

## 2014-07-02 DIAGNOSIS — E785 Hyperlipidemia, unspecified: Secondary | ICD-10-CM | POA: Diagnosis not present

## 2014-07-02 DIAGNOSIS — Z0000 Encounter for general adult medical examination without abnormal findings: Secondary | ICD-10-CM | POA: Diagnosis not present

## 2014-07-02 DIAGNOSIS — J309 Allergic rhinitis, unspecified: Secondary | ICD-10-CM | POA: Insufficient documentation

## 2014-07-02 LAB — COMPREHENSIVE METABOLIC PANEL
ALT: 18 U/L (ref 0–53)
AST: 18 U/L (ref 0–37)
Albumin: 4.1 g/dL (ref 3.5–5.2)
Alkaline Phosphatase: 60 U/L (ref 39–117)
BUN: 18 mg/dL (ref 6–23)
CALCIUM: 9.5 mg/dL (ref 8.4–10.5)
CHLORIDE: 107 meq/L (ref 96–112)
CO2: 24 meq/L (ref 19–32)
CREATININE: 1.02 mg/dL (ref 0.40–1.50)
GFR: 74.85 mL/min (ref >60.00)
Glucose, Bld: 94 mg/dL (ref 70–99)
Potassium: 4.2 mEq/L (ref 3.5–5.1)
SODIUM: 137 meq/L (ref 135–145)
TOTAL PROTEIN: 6.8 g/dL (ref 6.0–8.3)
Total Bilirubin: 0.5 mg/dL (ref 0.2–1.2)

## 2014-07-02 LAB — CBC WITH DIFFERENTIAL/PLATELET
BASOS PCT: 0.4 % (ref 0.0–3.0)
Basophils Absolute: 0 10*3/uL (ref 0.0–0.1)
EOS PCT: 2.3 % (ref 0.0–5.0)
Eosinophils Absolute: 0.2 10*3/uL (ref 0.0–0.7)
HCT: 43.1 % (ref 39.0–52.0)
HEMOGLOBIN: 14.6 g/dL (ref 13.0–17.0)
LYMPHS PCT: 19.4 % (ref 12.0–46.0)
Lymphs Abs: 1.3 10*3/uL (ref 0.7–4.0)
MCHC: 34 g/dL (ref 30.0–36.0)
MCV: 89.4 fl (ref 78.0–100.0)
Monocytes Absolute: 0.6 10*3/uL (ref 0.1–1.0)
Monocytes Relative: 8.5 % (ref 3.0–12.0)
NEUTROS PCT: 69.4 % (ref 43.0–77.0)
Neutro Abs: 4.5 10*3/uL (ref 1.4–7.7)
PLATELETS: 201 10*3/uL (ref 150.0–400.0)
RBC: 4.82 Mil/uL (ref 4.22–5.81)
RDW: 13.6 % (ref 11.5–15.5)
WBC: 6.5 10*3/uL (ref 4.0–10.5)

## 2014-07-02 LAB — LIPID PANEL
CHOL/HDL RATIO: 2
Cholesterol: 138 mg/dL (ref 0–200)
HDL: 60.9 mg/dL (ref >39.00)
LDL CALC: 47 mg/dL (ref 0–99)
NONHDL: 77.1
Triglycerides: 151 mg/dL — ABNORMAL HIGH (ref 0.0–149.0)
VLDL: 30.2 mg/dL (ref 0.0–40.0)

## 2014-07-02 LAB — TSH: TSH: 3.39 u[IU]/mL (ref 0.35–4.50)

## 2014-07-02 MED ORDER — IPRATROPIUM BROMIDE 0.03 % NA SOLN: 2.0000 | Freq: Two times a day (BID) | NASAL | Status: DC

## 2014-07-02 MED ORDER — FLUTICASONE PROPIONATE 50 MCG/ACT NA SUSP: 2.0000 | Freq: Every day | NASAL | Status: DC

## 2014-07-02 NOTE — Assessment & Plan Note (Signed)
He has persistent symptoms, I have asked him to increase the dose of the flonase and will start atrovent ns as well to treat non-allergic causes

## 2014-07-02 NOTE — Assessment & Plan Note (Signed)
He has achieved his LDL goal and is doing well on crestor 

## 2014-07-02 NOTE — Progress Notes (Signed)
Subjective:    Patient ID: [name] [name], male    DOB: Sep 26, 1932, 81 y.o.   MRN: [id number]  Hypertension This is a chronic problem. The current episode started more than 1 year ago. The problem is unchanged. The problem is controlled. Pertinent negatives include no anxiety, blurred vision, chest pain, headaches, malaise/fatigue, neck pain, orthopnea, palpitations, peripheral edema, PND, shortness of breath or sweats. There are no associated agents to hypertension. Past treatments include beta blockers and ACE inhibitors. There are no compliance problems.  Hypertensive end-organ damage includes CAD/MI.      Review of Systems  Constitutional: Negative.  Negative for fever, chills, malaise/fatigue, diaphoresis, appetite change and fatigue.  HENT: Positive for congestion, postnasal drip, rhinorrhea and sneezing. Negative for dental problem, mouth sores, nosebleeds, sinus pressure, sore throat, tinnitus, trouble swallowing and voice change.   Eyes: Negative.  Negative for blurred vision.  Respiratory: Negative.  Negative for cough, choking, chest tightness, shortness of breath and stridor.   Cardiovascular: Negative.  Negative for chest pain, palpitations, orthopnea, leg swelling and PND.  Gastrointestinal: Negative.  Negative for nausea, vomiting, abdominal pain, diarrhea, constipation and blood in stool.  Endocrine: Negative.   Genitourinary: Negative.  Negative for decreased urine volume, difficulty urinating and testicular pain.  Musculoskeletal: Negative.  Negative for neck pain.  Skin: Negative.  Negative for rash.  Allergic/Immunologic: Negative.   Neurological: Negative.  Negative for headaches.  Hematological: Negative.  Negative for adenopathy. Does not bruise/bleed easily.  Psychiatric/Behavioral: Negative.        Objective:   Physical Exam  Constitutional: He is oriented to person, place, and time. He appears well-developed and well-nourished.  Non-toxic appearance. He does  not have a sickly appearance. He does not appear ill. No distress.  HENT:  Right Ear: Hearing, tympanic membrane, external ear and ear canal normal.  Left Ear: Hearing, tympanic membrane, external ear and ear canal normal.  Nose: Mucosal edema and rhinorrhea present. No nose lacerations, sinus tenderness, nasal deformity, septal deviation or nasal septal hematoma. No epistaxis.  No foreign bodies. Right sinus exhibits no maxillary sinus tenderness and no frontal sinus tenderness. Left sinus exhibits no maxillary sinus tenderness and no frontal sinus tenderness.  Mouth/Throat: Oropharynx is clear and moist and mucous membranes are normal. Mucous membranes are not pale, not dry and not cyanotic. No oral lesions. No trismus in the jaw. No uvula swelling. No oropharyngeal exudate, posterior oropharyngeal edema, posterior oropharyngeal erythema or tonsillar abscesses.  Eyes: Conjunctivae are normal. Right eye exhibits no discharge. Left eye exhibits no discharge. No scleral icterus.  Neck: Normal range of motion. Neck supple. No JVD present. No tracheal deviation present. No thyromegaly present.  Cardiovascular: Normal rate, regular rhythm, normal heart sounds and intact distal pulses.  Exam reveals no gallop and no friction rub.   No murmur heard. Pulmonary/Chest: Effort normal and breath sounds normal. No stridor. No respiratory distress. He has no wheezes. He has no rales. He exhibits no tenderness.  Abdominal: Soft. Bowel sounds are normal. He exhibits no distension and no mass. There is no tenderness. There is no rebound and no guarding. Hernia confirmed negative in the right inguinal area and confirmed negative in the left inguinal area.  Genitourinary: Testes normal and penis normal. Right testis shows no mass, no swelling and no tenderness. Right testis is descended. Left testis shows no mass, no swelling and no tenderness. Left testis is descended. Uncircumcised. No phimosis, paraphimosis,  hypospadias, penile erythema or penile tenderness. No discharge found.  Musculoskeletal: Normal range of motion. He exhibits no edema or tenderness.  Lymphadenopathy:    He has no cervical adenopathy.       Right: No inguinal adenopathy present.       Left: No inguinal adenopathy present.  Neurological: He is oriented to person, place, and time.  Skin: Skin is warm and dry. No rash noted. He is not diaphoretic. No erythema. No pallor.  Psychiatric: He has a normal mood and affect. His behavior is normal. Judgment and thought content normal.  Nursing note and vitals reviewed.    Lab Results  Component Value Date   WBC 6.5 07/02/2014   HGB 14.6 07/02/2014   HCT 43.1 07/02/2014   PLT 201.0 07/02/2014   GLUCOSE 94 07/02/2014   CHOL 138 07/02/2014   TRIG 151.0* 07/02/2014   HDL 60.90 07/02/2014   LDLDIRECT 132.2 03/10/2007   LDLCALC 47 07/02/2014   ALT 18 07/02/2014   AST 18 07/02/2014   NA 137 07/02/2014   K 4.2 07/02/2014   CL 107 07/02/2014   CREATININE 1.02 07/02/2014   BUN 18 07/02/2014   CO2 24 07/02/2014   TSH 3.39 07/02/2014   PSA 0.44 05/10/2011   INR 1.0 04/27/2012       Assessment & Plan:

## 2014-07-02 NOTE — Assessment & Plan Note (Signed)
His BP is well controlled Lytes and renal function are stable 

## 2014-07-02 NOTE — Patient Instructions (Signed)
Abdominal Pain Many things can cause abdominal pain. Usually, abdominal pain is not caused by a disease and will improve without treatment. It can often be observed and treated at home. Your health care provider will do a physical exam and possibly order blood tests and X-rays to help determine the seriousness of your pain. However, in many cases, more time must pass before a clear cause of the pain can be found. Before that point, your health care provider may not know if you need more testing or further treatment. HOME CARE INSTRUCTIONS  Monitor your abdominal pain for any changes. The following actions may help to alleviate any discomfort you are experiencing:  Only take over-the-counter or prescription medicines as directed by your health care provider.  Do not take laxatives unless directed to do so by your health care provider.  Try a clear liquid diet (broth, tea, or water) as directed by your health care provider. Slowly move to a bland diet as tolerated. SEEK MEDICAL CARE IF:  You have unexplained abdominal pain.  You have abdominal pain associated with nausea or diarrhea.  You have pain when you urinate or have a bowel movement.  You experience abdominal pain that wakes you in the night.  You have abdominal pain that is worsened or improved by eating food.  You have abdominal pain that is worsened with eating fatty foods.  You have a fever. SEEK IMMEDIATE MEDICAL CARE IF:   Your pain does not go away within 2 hours.  You keep throwing up (vomiting).  Your pain is felt only in portions of the abdomen, such as the right side or the left lower portion of the abdomen.  You pass bloody or black tarry stools. MAKE SURE YOU:  Understand these instructions.   Will watch your condition.   Will get help right away if you are not doing well or get worse.  Document Released: 02/10/2005 Document Revised: 05/08/2013 Document Reviewed: 01/10/2013 [facility] Patient Information  2015 [place], [place]. This information is not intended to replace advice given to you by your health care provider. Make sure you discuss any questions you have with your health care provider. Health Maintenance A healthy lifestyle and preventative care can promote health and wellness.  Maintain regular health, dental, and eye exams.  Eat a healthy diet. Foods like vegetables, fruits, whole grains, low-fat dairy products, and lean protein foods contain the nutrients you need and are low in calories. Decrease your intake of foods high in solid fats, added sugars, and salt. Get information about a proper diet from your health care provider, if necessary.  Regular physical exercise is one of the most important things you can do for your health. Most adults should get at least 150 minutes of moderate-intensity exercise (any activity that increases your heart rate and causes you to sweat) each week. In addition, most adults need muscle-strengthening exercises on 2 or more days a week.   Maintain a healthy weight. The body mass index (BMI) is a screening tool to identify possible weight problems. It provides an estimate of body fat based on height and weight. Your health care provider can find your BMI and can help you achieve or maintain a healthy weight. For males 20 years and older:  A BMI below 18.5 is considered underweight.  A BMI of 18.5 to 24.9 is normal.  A BMI of 25 to 29.9 is considered overweight.  A BMI of 30 and above is considered obese.  Maintain normal blood lipids and  cholesterol by exercising and minimizing your intake of saturated fat. Eat a balanced diet with plenty of fruits and vegetables. Blood tests for lipids and cholesterol should begin at age 50 and be repeated every 5 years. If your lipid or cholesterol levels are high, you are over age 74, or you are at high risk for heart disease, you may need your cholesterol levels checked more frequently.Ongoing high lipid and  cholesterol levels should be treated with medicines if diet and exercise are not working.  If you smoke, find out from your health care provider how to quit. If you do not use tobacco, do not start.  Lung cancer screening is recommended for adults aged 15-80 years who are at high risk for developing lung cancer because of a history of smoking. A yearly low-dose CT scan of the lungs is recommended for people who have at least a 30-pack-year history of smoking and are current smokers or have quit within the past 15 years. A pack year of smoking is smoking an average of 1 pack of cigarettes a day for 1 year (for example, a 30-pack-year history of smoking could mean smoking 1 pack a day for 30 years or 2 packs a day for 15 years). Yearly screening should continue until the smoker has stopped smoking for at least 15 years. Yearly screening should be stopped for people who develop a health problem that would prevent them from having lung cancer treatment.  If you choose to drink alcohol, do not have more than 2 drinks per day. One drink is considered to be 12 oz (360 mL) of beer, 5 oz (150 mL) of wine, or 1.5 oz (45 mL) of liquor.  Avoid the use of street drugs. Do not share needles with anyone. Ask for help if you need support or instructions about stopping the use of drugs.  High blood pressure causes heart disease and increases the risk of stroke. Blood pressure should be checked at least every 1-2 years. Ongoing high blood pressure should be treated with medicines if weight loss and exercise are not effective.  If you are 13-50 years old, ask your health care provider if you should take aspirin to prevent heart disease.  Diabetes screening involves taking a blood sample to check your fasting blood sugar level. This should be done once every 3 years after age 37 if you are at a normal weight and without risk factors for diabetes. Testing should be considered at a younger age or be carried out more  frequently if you are overweight and have at least 1 risk factor for diabetes.  Colorectal cancer can be detected and often prevented. Most routine colorectal cancer screening begins at the age of 55 and continues through age 65. However, your health care provider may recommend screening at an earlier age if you have risk factors for colon cancer. On a yearly basis, your health care provider may provide home test kits to check for hidden blood in the stool. A small camera at the end of a tube may be used to directly examine the colon (sigmoidoscopy or colonoscopy) to detect the earliest forms of colorectal cancer. Talk to your health care provider about this at age 68 when routine screening begins. A direct exam of the colon should be repeated every 5-10 years through age 9, unless early forms of precancerous polyps or small growths are found.  People who are at an increased risk for hepatitis B should be screened for this virus. You are considered  at high risk for hepatitis B if:  You were born in a country where hepatitis B occurs often. Talk with your health care provider about which countries are considered high risk.  Your parents were born in a high-risk country and you have not received a shot to protect against hepatitis B (hepatitis B vaccine).  You have HIV or AIDS.  You use needles to inject street drugs.  You live with, or have sex with, someone who has hepatitis B.  You are a man who has sex with other men (MSM).  You get hemodialysis treatment.  You take certain medicines for conditions like cancer, organ transplantation, and autoimmune conditions.  Hepatitis C blood testing is recommended for all people born from [birth year] through 1963 and any individual with known risk factors for hepatitis C.  Healthy men should no longer receive prostate-specific antigen (PSA) blood tests as part of routine cancer screening. Talk to your health care provider about prostate cancer  screening.  Testicular cancer screening is not recommended for adolescents or adult males who have no symptoms. Screening includes self-exam, a health care provider exam, and other screening tests. Consult with your health care provider about any symptoms you have or any concerns you have about testicular cancer.  Practice safe sex. Use condoms and avoid high-risk sexual practices to reduce the spread of sexually transmitted infections (STIs).  You should be screened for STIs, including gonorrhea and chlamydia if:  You are sexually active and are younger than 24 years.  You are older than 24 years, and your health care provider tells you that you are at risk for this type of infection.  Your sexual activity has changed since you were last screened, and you are at an increased risk for chlamydia or gonorrhea. Ask your health care provider if you are at risk.  If you are at risk of being infected with HIV, it is recommended that you take a prescription medicine daily to prevent HIV infection. This is called pre-exposure prophylaxis (PrEP). You are considered at risk if:  You are a man who has sex with other men (MSM).  You are a heterosexual man who is sexually active with multiple partners.  You take drugs by injection.  You are sexually active with a partner who has HIV.  Talk with your health care provider about whether you are at high risk of being infected with HIV. If you choose to begin PrEP, you should first be tested for HIV. You should then be tested every 3 months for as long as you are taking PrEP.  Use sunscreen. Apply sunscreen liberally and repeatedly throughout the day. You should seek shade when your shadow is shorter than you. Protect yourself by wearing long sleeves, pants, a wide-brimmed hat, and sunglasses year round whenever you are outdoors.  Tell your health care provider of new moles or changes in moles, especially if there is a change in shape or color. Also, tell  your health care provider if a mole is larger than the size of a pencil eraser.  A one-time screening for abdominal aortic aneurysm (AAA) and surgical repair of large AAAs by ultrasound is recommended for men aged 67-75 years who are current or former smokers.  Stay current with your vaccines (immunizations). Document Released: 10/30/2007 Document Revised: 05/08/2013 Document Reviewed: 09/28/2010 [facility] Patient Information 2015 [place], [place]. This information is not intended to replace advice given to you by your health care provider. Make sure you discuss any questions you have with  your health care provider.

## 2014-07-02 NOTE — Assessment & Plan Note (Signed)

## 2014-07-02 NOTE — Progress Notes (Signed)
Pre visit review using our clinic review tool, if applicable. No additional management support is needed unless otherwise documented below in the visit note. 

## 2014-07-03 ENCOUNTER — Telehealth: Payer: Self-pay | Admitting: Internal Medicine

## 2014-07-03 NOTE — Telephone Encounter (Signed)
emmi emailed °

## 2014-07-10 DIAGNOSIS — Z85828 Personal history of other malignant neoplasm of skin: Secondary | ICD-10-CM | POA: Diagnosis not present

## 2014-07-10 DIAGNOSIS — L570 Actinic keratosis: Secondary | ICD-10-CM | POA: Diagnosis not present

## 2014-07-10 DIAGNOSIS — Z85820 Personal history of malignant melanoma of skin: Secondary | ICD-10-CM | POA: Diagnosis not present

## 2014-07-29 DIAGNOSIS — H2513 Age-related nuclear cataract, bilateral: Secondary | ICD-10-CM | POA: Diagnosis not present

## 2014-07-29 DIAGNOSIS — H5213 Myopia, bilateral: Secondary | ICD-10-CM | POA: Diagnosis not present

## 2014-07-29 DIAGNOSIS — H5000 Unspecified esotropia: Secondary | ICD-10-CM | POA: Diagnosis not present

## 2014-08-07 DIAGNOSIS — D1801 Hemangioma of skin and subcutaneous tissue: Secondary | ICD-10-CM | POA: Diagnosis not present

## 2014-08-07 DIAGNOSIS — Z85828 Personal history of other malignant neoplasm of skin: Secondary | ICD-10-CM | POA: Diagnosis not present

## 2014-08-07 DIAGNOSIS — L820 Inflamed seborrheic keratosis: Secondary | ICD-10-CM | POA: Diagnosis not present

## 2014-08-07 DIAGNOSIS — L821 Other seborrheic keratosis: Secondary | ICD-10-CM | POA: Diagnosis not present

## 2014-08-07 DIAGNOSIS — L814 Other melanin hyperpigmentation: Secondary | ICD-10-CM | POA: Diagnosis not present

## 2014-08-07 DIAGNOSIS — L570 Actinic keratosis: Secondary | ICD-10-CM | POA: Diagnosis not present

## 2014-08-07 DIAGNOSIS — L853 Xerosis cutis: Secondary | ICD-10-CM | POA: Diagnosis not present

## 2014-08-07 DIAGNOSIS — Z85820 Personal history of malignant melanoma of skin: Secondary | ICD-10-CM | POA: Diagnosis not present

## 2014-08-20 DIAGNOSIS — M7542 Impingement syndrome of left shoulder: Secondary | ICD-10-CM | POA: Diagnosis not present

## 2014-08-20 DIAGNOSIS — M65311 Trigger thumb, right thumb: Secondary | ICD-10-CM | POA: Diagnosis not present

## 2014-08-29 DIAGNOSIS — M65311 Trigger thumb, right thumb: Secondary | ICD-10-CM | POA: Diagnosis not present

## 2014-08-29 DIAGNOSIS — M7542 Impingement syndrome of left shoulder: Secondary | ICD-10-CM | POA: Diagnosis not present

## 2014-09-03 ENCOUNTER — Telehealth: Payer: Self-pay | Admitting: Internal Medicine

## 2014-09-03 ENCOUNTER — Other Ambulatory Visit: Payer: Self-pay

## 2014-09-03 DIAGNOSIS — J302 Other seasonal allergic rhinitis: Secondary | ICD-10-CM

## 2014-09-03 MED ORDER — ALPRAZOLAM 0.25 MG PO TABS: ORAL_TABLET | ORAL | Status: DC

## 2014-09-03 MED ORDER — IPRATROPIUM BROMIDE 0.03 % NA SOLN: 2.0000 | Freq: Two times a day (BID) | NASAL | Status: DC

## 2014-09-03 MED ORDER — FLUTICASONE PROPIONATE 50 MCG/ACT NA SUSP: 2.0000 | Freq: Every day | NASAL | Status: DC

## 2014-09-03 NOTE — Telephone Encounter (Signed)
Received medical records from [facility], sent to Dr. Ronnald Ramp. 09/03/14/ss

## 2014-09-04 DIAGNOSIS — M79622 Pain in left upper arm: Secondary | ICD-10-CM | POA: Diagnosis not present

## 2014-09-04 DIAGNOSIS — M25612 Stiffness of left shoulder, not elsewhere classified: Secondary | ICD-10-CM | POA: Diagnosis not present

## 2014-09-04 DIAGNOSIS — M25512 Pain in left shoulder: Secondary | ICD-10-CM | POA: Diagnosis not present

## 2014-09-09 ENCOUNTER — Telehealth: Payer: Self-pay

## 2014-09-09 MED ORDER — ZOLPIDEM TARTRATE 10 MG PO TABS: 10.0000 mg | ORAL_TABLET | Freq: Every evening | ORAL | Status: DC | PRN

## 2014-09-09 NOTE — Telephone Encounter (Signed)
[facility] for zol[facility]em 10mg  . Rx last written 02/18/14 #30/5 rf and pt last seen 07/02/14   . Please advise Thanks

## 2014-09-10 DIAGNOSIS — M79622 Pain in left upper arm: Secondary | ICD-10-CM | POA: Diagnosis not present

## 2014-09-10 DIAGNOSIS — M25612 Stiffness of left shoulder, not elsewhere classified: Secondary | ICD-10-CM | POA: Diagnosis not present

## 2014-09-10 DIAGNOSIS — M25512 Pain in left shoulder: Secondary | ICD-10-CM | POA: Diagnosis not present

## 2014-09-17 DIAGNOSIS — M79622 Pain in left upper arm: Secondary | ICD-10-CM | POA: Diagnosis not present

## 2014-09-17 DIAGNOSIS — M25612 Stiffness of left shoulder, not elsewhere classified: Secondary | ICD-10-CM | POA: Diagnosis not present

## 2014-09-17 DIAGNOSIS — M25512 Pain in left shoulder: Secondary | ICD-10-CM | POA: Diagnosis not present

## 2014-09-25 DIAGNOSIS — M25512 Pain in left shoulder: Secondary | ICD-10-CM | POA: Diagnosis not present

## 2014-09-25 DIAGNOSIS — M7542 Impingement syndrome of left shoulder: Secondary | ICD-10-CM | POA: Diagnosis not present

## 2014-09-25 DIAGNOSIS — M79622 Pain in left upper arm: Secondary | ICD-10-CM | POA: Diagnosis not present

## 2014-09-26 DIAGNOSIS — M79622 Pain in left upper arm: Secondary | ICD-10-CM | POA: Diagnosis not present

## 2014-09-26 DIAGNOSIS — M25512 Pain in left shoulder: Secondary | ICD-10-CM | POA: Diagnosis not present

## 2014-09-26 DIAGNOSIS — M7542 Impingement syndrome of left shoulder: Secondary | ICD-10-CM | POA: Diagnosis not present

## 2014-09-30 ENCOUNTER — Telehealth: Payer: Self-pay | Admitting: Internal Medicine

## 2014-09-30 NOTE — Telephone Encounter (Signed)
Received records from [facility] forwarded to Dr. Ronnald Ramp 09/30/14 fbg.

## 2014-10-09 ENCOUNTER — Other Ambulatory Visit: Payer: Self-pay | Admitting: Internal Medicine

## 2014-10-09 ENCOUNTER — Telehealth: Payer: Self-pay | Admitting: Internal Medicine

## 2014-10-09 NOTE — Telephone Encounter (Signed)
Called pt spoke with pt wife gave md response. Inform wife to contact [facility] because he should have refills...[name] [name]

## 2014-10-09 NOTE — Telephone Encounter (Signed)
Patient requesting refill for zolpidem (AMBIEN) 10 MG tablet [[id number]. Pharmacy is [facility] on [address]

## 2014-10-09 NOTE — Telephone Encounter (Signed)
He just got this in april

## 2014-11-05 ENCOUNTER — Encounter: Payer: Self-pay | Admitting: Internal Medicine

## 2014-11-05 DIAGNOSIS — R351 Nocturia: Secondary | ICD-10-CM | POA: Diagnosis not present

## 2014-11-11 ENCOUNTER — Other Ambulatory Visit: Payer: Self-pay | Admitting: *Deleted

## 2014-11-20 ENCOUNTER — Other Ambulatory Visit: Payer: Self-pay

## 2014-12-10 DIAGNOSIS — M7542 Impingement syndrome of left shoulder: Secondary | ICD-10-CM | POA: Diagnosis not present

## 2014-12-20 ENCOUNTER — Other Ambulatory Visit: Payer: Self-pay | Admitting: *Deleted

## 2014-12-20 MED ORDER — NITROGLYCERIN 0.4 MG SL SUBL: 0.4000 mg | SUBLINGUAL_TABLET | SUBLINGUAL | Status: DC | PRN

## 2014-12-25 ENCOUNTER — Other Ambulatory Visit: Payer: Self-pay

## 2014-12-25 MED ORDER — NITROGLYCERIN 0.4 MG SL SUBL: 0.4000 mg | SUBLINGUAL_TABLET | SUBLINGUAL | Status: DC | PRN

## 2015-01-14 NOTE — Progress Notes (Signed)
Patient ID: [name] [name], male   DOB: August 03, 1932, 81 y.o.   MRN: [id number] He has a hx of CAD, s/p stent to the LAD in 2008, HTN, HL, IBS, anxiety. Echo 9/11: EF 55-60%, normal motion, grade 2 diastolic dysfunction, mild MR, mild LAE, mild RAE, PASP 33. He saw me  in 03/2012 and noted throat pain intermittently. He was set up for an ETT. ETT 04/27/12: Abnormal with episodes of ventricular tachycardia-longest 18 beats. He was set up for cardiac catheterization. LHC 05/01/12: pLAD 30% ISR, mCFX 30%, dRCA 60-75% which had progressed from previous study in 2010. Patient was loaded with Plavix and brought back 05/03/12  for pressure wire analysis. FFR at peak hyperemia was 0.92 which was felt to be hemodynamically insignificant. Continued medical therapy was recommended. He was placed on Toprol and his Plavix was stopped.   Doing well since being on Toprol mild exertional dyspnea and occasional chest pressure when walking [place] beach  Previous visual issues turned out to be occular migrains  Needs lisinopril and crestor refilled Will have labs with Dr Ronnald Ramp  in January  Was kind enough to bring me a bottle of [place] today   01/11/14  1-39% bilateral carotid disease  04/23/14 Myovue Reviewed  Overall Impression: Low risk stress nuclear study with mild basal septal as well as basal inferolateral defect seen at both rest and stress with associated minimal ischemia.   LV Ejection Fraction: 54%. LV Wall Motion: NL LV Function; NL Wall Motion   ROS: Denies fever, malais, weight loss, blurry vision, decreased visual acuity, cough, sputum, SOB, hemoptysis, pleuritic pain, palpitaitons, heartburn, abdominal pain, melena, lower extremity edema, claudication, or rash.  All other systems reviewed and negative  General: Affect appropriate Healthy:  appears stated age 24: normal Neck supple with no adenopathy JVP normal no bruits no thyromegaly Lungs clear with no wheezing and good diaphragmatic  motion Heart:  S1/S2 no murmur, no rub, gallop or click PMI normal Abdomen: benighn, BS positve, no tenderness, no AAA no bruit.  No HSM or HJR Distal pulses intact with no bruits No edema Neuro non-focal Skin warm and dry No muscular weakness   Current Outpatient Prescriptions  Medication Sig Dispense Refill  . ALPRAZolam (XANAX) 0.25 MG tablet take 1/2 to 1 tablet by mouth at bedtime if needed for sleep 30 tablet 0  . aspirin 81 MG tablet Take 81 mg by mouth daily.      . Bisacodyl (DULCOLAX PO) Take 1 tablet by mouth daily as needed (for consitpation).     Marland Kitchen dutasteride (AVODART) 0.5 MG capsule Take 0.5 mg by mouth daily.    . fluticasone (FLONASE) 50 MCG/ACT nasal spray Place 2 sprays into both nostrils daily. 32 g 3  . ipratropium (ATROVENT) 0.03 % nasal spray Place 2 sprays into both nostrils every 12 (twelve) hours. 90 mL 3  . lisinopril (PRINIVIL,ZESTRIL) 10 MG tablet Take 1 tablet (10 mg total) by mouth daily. 90 tablet 3  . metoprolol succinate (TOPROL-XL) 25 MG 24 hr tablet Take 1 tablet (25 mg total) by mouth daily. 90 tablet 3  . nitroGLYCERIN (NITROSTAT) 0.4 MG SL tablet Place 0.4 mg under the tongue every 5 (five) minutes as needed for chest pain (up to 3 doses only).    . pantoprazole (PROTONIX) 40 MG tablet Take 1 tablet (40 mg total) by mouth daily. 90 tablet 3  . Psyllium (MEDI-MUCIL PO) Take 240 g by mouth daily. Every morning     . rosuvastatin (  CRESTOR) 40 MG tablet Take 1 tablet (40 mg total) by mouth daily. 90 tablet 3  . zolpidem (AMBIEN) 10 MG tablet Take 1 tablet (10 mg total) by mouth at bedtime as needed. For sleep 30 tablet 5   No current facility-administered medications for this visit.    Allergies  Review of patient's allergies indicates no known allergies.  Electrocardiogram:  04/18/14  SR rate 16  ICRBBB old IMI possible nonspecific ST T wave changes  no change from 2014   Assessment and Plan CAD: Stable with no angina and good activity level.   Continue medical Rx Chol: Lab Results  Component Value Date   LDLCALC 47 07/02/2014    GERD:  Continue protonix   Insmonia:  Chronic on ambien f/u Dr Ronnald Ramp

## 2015-01-15 ENCOUNTER — Other Ambulatory Visit: Payer: Self-pay

## 2015-01-15 ENCOUNTER — Ambulatory Visit (INDEPENDENT_AMBULATORY_CARE_PROVIDER_SITE_OTHER): Payer: Medicare Other | Admitting: Cardiovascular Disease

## 2015-01-15 ENCOUNTER — Encounter: Payer: Self-pay | Admitting: Cardiovascular Disease

## 2015-01-15 VITALS — BP 112/50 | HR 63 | Ht 70.0 in | Wt 149.8 lb

## 2015-01-15 DIAGNOSIS — R079 Chest pain, unspecified: Secondary | ICD-10-CM

## 2015-01-15 DIAGNOSIS — I10 Essential (primary) hypertension: Secondary | ICD-10-CM | POA: Diagnosis not present

## 2015-01-15 DIAGNOSIS — I25118 Atherosclerotic heart disease of native coronary artery with other forms of angina pectoris: Secondary | ICD-10-CM

## 2015-01-15 NOTE — Patient Instructions (Addendum)
Medication Instructions:  NO CHANGES  Labwork: NONE  Testing/Procedures: NONE  Follow-Up: Your physician wants you to follow-up in:   12 MONTHS WITH DR NISHAN You will receive a reminder letter in the mail two months in advance. If you don't receive a letter, please call our office to schedule the follow-up appointment.  Any Other Special Instructions Will Be Listed Below (If Applicable).   

## 2015-01-15 NOTE — Telephone Encounter (Signed)
Requesting [facility] day R[facility]. Please Advise

## 2015-01-16 MED ORDER — ZOLPIDEM TARTRATE 10 MG PO TABS: 10.0000 mg | ORAL_TABLET | Freq: Every evening | ORAL | Status: DC | PRN

## 2015-01-25 ENCOUNTER — Other Ambulatory Visit: Payer: Self-pay | Admitting: Cardiovascular Disease

## 2015-01-31 ENCOUNTER — Telehealth: Payer: Self-pay

## 2015-01-31 MED ORDER — ZOLPIDEM TARTRATE 10 MG PO TABS: 10.0000 mg | ORAL_TABLET | Freq: Every evening | ORAL | Status: DC | PRN

## 2015-01-31 NOTE — Telephone Encounter (Signed)
Pt sent a letter stating that he wanted the rx for Ambien 10 mg filled via [facility].   [facility] and they have the 01/16/2015 rx filled for #30 with 5 refills. I informed [facility] that the prescription needed to be cancelled.  They have cancelled the order.   Called [facility]. They need the rx faxed to [phone] with [employer] ID# [id number]  Reprinted and faxed via [facility] approval.

## 2015-02-14 ENCOUNTER — Other Ambulatory Visit: Payer: Self-pay

## 2015-02-14 DIAGNOSIS — D692 Other nonthrombocytopenic purpura: Secondary | ICD-10-CM | POA: Diagnosis not present

## 2015-02-14 DIAGNOSIS — Z85820 Personal history of malignant melanoma of skin: Secondary | ICD-10-CM | POA: Diagnosis not present

## 2015-02-14 DIAGNOSIS — L821 Other seborrheic keratosis: Secondary | ICD-10-CM | POA: Diagnosis not present

## 2015-02-14 DIAGNOSIS — L814 Other melanin hyperpigmentation: Secondary | ICD-10-CM | POA: Diagnosis not present

## 2015-02-14 DIAGNOSIS — D1801 Hemangioma of skin and subcutaneous tissue: Secondary | ICD-10-CM | POA: Diagnosis not present

## 2015-02-14 DIAGNOSIS — Z85828 Personal history of other malignant neoplasm of skin: Secondary | ICD-10-CM | POA: Diagnosis not present

## 2015-02-14 DIAGNOSIS — L570 Actinic keratosis: Secondary | ICD-10-CM | POA: Diagnosis not present

## 2015-02-14 DIAGNOSIS — L820 Inflamed seborrheic keratosis: Secondary | ICD-10-CM | POA: Diagnosis not present

## 2015-02-14 MED ORDER — PANTOPRAZOLE SODIUM 40 MG PO TBEC: 40.0000 mg | DELAYED_RELEASE_TABLET | Freq: Every day | ORAL | Status: DC

## 2015-02-27 ENCOUNTER — Other Ambulatory Visit: Payer: Self-pay

## 2015-02-27 MED ORDER — PANTOPRAZOLE SODIUM 40 MG PO TBEC: 40.0000 mg | DELAYED_RELEASE_TABLET | Freq: Every day | ORAL | Status: DC

## 2015-03-04 DIAGNOSIS — H52223 Regular astigmatism, bilateral: Secondary | ICD-10-CM | POA: Diagnosis not present

## 2015-03-04 DIAGNOSIS — H524 Presbyopia: Secondary | ICD-10-CM | POA: Diagnosis not present

## 2015-03-04 DIAGNOSIS — H25813 Combined forms of age-related cataract, bilateral: Secondary | ICD-10-CM | POA: Diagnosis not present

## 2015-03-04 DIAGNOSIS — H5213 Myopia, bilateral: Secondary | ICD-10-CM | POA: Diagnosis not present

## 2015-04-08 DIAGNOSIS — Z85828 Personal history of other malignant neoplasm of skin: Secondary | ICD-10-CM | POA: Diagnosis not present

## 2015-04-08 DIAGNOSIS — Z85820 Personal history of malignant melanoma of skin: Secondary | ICD-10-CM | POA: Diagnosis not present

## 2015-04-08 DIAGNOSIS — D485 Neoplasm of uncertain behavior of skin: Secondary | ICD-10-CM | POA: Diagnosis not present

## 2015-04-08 DIAGNOSIS — C44729 Squamous cell carcinoma of skin of left lower limb, including hip: Secondary | ICD-10-CM | POA: Diagnosis not present

## 2015-04-08 DIAGNOSIS — L570 Actinic keratosis: Secondary | ICD-10-CM | POA: Diagnosis not present

## 2015-04-16 DIAGNOSIS — M75112 Incomplete rotator cuff tear or rupture of left shoulder, not specified as traumatic: Secondary | ICD-10-CM | POA: Diagnosis not present

## 2015-04-18 ENCOUNTER — Other Ambulatory Visit: Payer: Self-pay | Admitting: Cardiovascular Disease

## 2015-04-23 ENCOUNTER — Other Ambulatory Visit: Payer: Self-pay | Admitting: Internal Medicine

## 2015-04-23 DIAGNOSIS — M25512 Pain in left shoulder: Secondary | ICD-10-CM | POA: Diagnosis not present

## 2015-04-29 DIAGNOSIS — H903 Sensorineural hearing loss, bilateral: Secondary | ICD-10-CM | POA: Diagnosis not present

## 2015-04-29 DIAGNOSIS — H9313 Tinnitus, bilateral: Secondary | ICD-10-CM | POA: Diagnosis not present

## 2015-05-12 DIAGNOSIS — M7582 Other shoulder lesions, left shoulder: Secondary | ICD-10-CM | POA: Diagnosis not present

## 2015-06-09 NOTE — Progress Notes (Signed)
Patient ID: [name] [name], male   DOB: [birth year], 82 y.o.   MRN: [id number]   He has a hx of CAD, s/p stent to the LAD in 2008, HTN, HL, IBS, anxiety. Echo 9/11: EF 55-60%, normal motion, grade 2 diastolic dysfunction, mild MR, mild LAE, mild RAE, PASP 33. He saw me  in 03/2012 and noted throat pain intermittently. He was set up for an ETT. ETT 04/27/12: Abnormal with episodes of ventricular tachycardia-longest 18 beats. He was set up for cardiac catheterization. LHC 05/01/12: pLAD 30% ISR, mCFX 30%, dRCA 60-75% which had progressed from previous study in 2010. Patient was loaded with Plavix and brought back 05/03/12  for pressure wire analysis. FFR at peak hyperemia was 0.92 which was felt to be hemodynamically insignificant. Continued medical therapy was recommended. He was placed on Toprol and his Plavix was stopped.   Doing well since being on Toprol mild exertional dyspnea and occasional chest pressure when walking at beach    Continues to have episodes of transient visual loss.  ? Occular migraines Been going on for over 3-4 years  Recently daughter who is a PA indicated pulse was irregular With episode.  Lasted 3-5 minutes.  Denies associated headache , speech difficulty, or other focal neuro signs Has seen [facility] before  MRI reviewed 11/2013 and  Mild small vessel disease    Needs lisinopril and crestor refilled Will have labs with Dr Ronnald Ramp  in January  Was kind enough to bring me a bottle of [place] today   01/11/14  1-39% bilateral carotid disease  04/23/14 Myovue Reviewed  Overall Impression: Low risk stress nuclear study with mild basal septal as well as basal inferolateral defect seen at both rest and stress with associated minimal ischemia.   LV Ejection Fraction: 54%. LV Wall Motion: NL LV Function; NL Wall Motion   ROS: Denies fever, malais, weight loss, blurry vision, decreased visual acuity, cough, sputum, SOB, hemoptysis, pleuritic pain, palpitaitons, heartburn,  abdominal pain, melena, lower extremity edema, claudication, or rash.  All other systems reviewed and negative  General: Affect appropriate Healthy:  appears stated age 3: normal Neck supple with no adenopathy JVP normal no bruits no thyromegaly Lungs clear with no wheezing and good diaphragmatic motion Heart:  S1/S2 no murmur, no rub, gallop or click PMI normal Abdomen: benighn, BS positve, no tenderness, no AAA no bruit.  No HSM or HJR Distal pulses intact with no bruits No edema Neuro non-focal Skin warm and dry No muscular weakness   Current Outpatient Prescriptions  Medication Sig Dispense Refill  . ALPRAZolam (XANAX) 0.25 MG tablet take 1/2 to 1 tablet by mouth at bedtime if needed for sleep 30 tablet 0  . aspirin 81 MG tablet Take 81 mg by mouth daily.      . Bisacodyl (DULCOLAX PO) Take 1 tablet by mouth daily as needed (for consitpation).     Marland Kitchen dutasteride (AVODART) 0.5 MG capsule Take 0.5 mg by mouth daily.    . fluticasone (FLONASE) 50 MCG/ACT nasal spray USE 2 SPRAYS IN EACH NOSTRIL DAILY 32 g 11  . ipratropium (ATROVENT) 0.03 % nasal spray Place 2 sprays into both nostrils every 12 (twelve) hours. 90 mL 3  . lisinopril (PRINIVIL,ZESTRIL) 10 MG tablet Take 1 tablet (10 mg total) by mouth daily. 90 tablet 2  . nitroGLYCERIN (NITROSTAT) 0.4 MG SL tablet Place 0.4 mg under the tongue every 5 (five) minutes as needed for chest pain (up to 3 doses only).    Marland Kitchen  pantoprazole (PROTONIX) 40 MG tablet Take 1 tablet (40 mg total) by mouth daily. 90 tablet 3  . Psyllium (MEDI-MUCIL PO) Take 240 g by mouth daily. Every morning     . rosuvastatin (CRESTOR) 40 MG tablet Take 1 tablet (40 mg total) by mouth daily. 90 tablet 2  . TOPROL XL 25 MG 24 hr tablet TAKE 1 TABLET DAILY 90 tablet 3  . zolpidem (AMBIEN) 10 MG tablet Take 1 tablet (10 mg total) by mouth at bedtime as needed for sleep. [employer] [id number] 30 tablet 5   No current facility-administered medications for this  visit.    Allergies  Review of patient's allergies indicates no known allergies.  Electrocardiogram:  04/18/14  SR rate 27  ICRBBB old IMI possible nonspecific ST T wave changes  no change from 2014  06/10/15  SR rate 50 PAC/PVC ICRBBB old IMI No afib.    Assessment and Plan CAD: Stable with no angina and good activity level.  Continue medical Rx Stent LAD 2008  Moderate distal RCA with normal flow wire 04/2012   Chol: Lab Results  Component Value Date   LDLCALC 47 07/02/2014    GERD:  Continue protonix   Insmonia:  Chronic on ambien f/u Dr Ronnald Ramp  Visual Disturbance:  Bothersome but not likely from PAF.  Think he should have loop recorder given daughters description of irregular pulse ( PAC;s in office)  Will also repeat MRI and refer to Dr Tat neuro.  Continue ASA  F/U with me in 6 months   [name]

## 2015-06-10 ENCOUNTER — Ambulatory Visit (INDEPENDENT_AMBULATORY_CARE_PROVIDER_SITE_OTHER): Payer: Medicare Other | Admitting: Cardiovascular Disease

## 2015-06-10 ENCOUNTER — Encounter: Payer: Self-pay | Admitting: Cardiovascular Disease

## 2015-06-10 VITALS — BP 118/68 | HR 84 | Ht 70.0 in | Wt 147.0 lb

## 2015-06-10 DIAGNOSIS — H53123 Transient visual loss, bilateral: Secondary | ICD-10-CM | POA: Diagnosis not present

## 2015-06-10 DIAGNOSIS — H539 Unspecified visual disturbance: Secondary | ICD-10-CM | POA: Diagnosis not present

## 2015-06-10 DIAGNOSIS — I472 Ventricular tachycardia: Secondary | ICD-10-CM

## 2015-06-10 DIAGNOSIS — I4729 Other ventricular tachycardia: Secondary | ICD-10-CM

## 2015-06-10 MED ORDER — DIAZEPAM 5 MG PO TABS: ORAL_TABLET | ORAL | Status: DC

## 2015-06-10 NOTE — Patient Instructions (Signed)
Medication Instructions:  Your physician recommends that you continue on your current medications as directed. Please refer to the Current Medication list given to you today.   Labwork: None   Testing/Procedures: Schedule an appointment for an MRI of your head.  Follow-Up: Your physician recommends that you schedule a follow-up appointment in: 3 months with Dr Johnsie Cancel   Any Other Special Instructions Will Be Listed Below (If Applicable).  You have been referred to  [facility] for evaluation for loop recorder.  You have been referred to Dr Carles Collet [facility] for evaluation for TIA/occular migraine.--ASAP    If you need a refill on your cardiac medications before your next appointment, please call your pharmacy.

## 2015-06-11 ENCOUNTER — Ambulatory Visit (INDEPENDENT_AMBULATORY_CARE_PROVIDER_SITE_OTHER): Payer: Medicare Other | Admitting: Internal Medicine

## 2015-06-11 ENCOUNTER — Encounter: Payer: Self-pay | Admitting: Internal Medicine

## 2015-06-11 ENCOUNTER — Telehealth: Payer: Self-pay | Admitting: Cardiovascular Disease

## 2015-06-11 VITALS — BP 124/60 | HR 77 | Ht 70.0 in | Wt 147.4 lb

## 2015-06-11 DIAGNOSIS — G459 Transient cerebral ischemic attack, unspecified: Secondary | ICD-10-CM | POA: Diagnosis not present

## 2015-06-11 NOTE — Progress Notes (Signed)
Electrophysiology Office Note   Date:  06/11/2015   ID:  [name], [name] April 20, 1933, MRN [id number]  PCP:  Scarlette Calico, MD  Cardiologist:  Dr Johnsie Cancel (referring) Primary Electrophysiologist: Thompson Grayer, MD    CC: visual changes   History of Present Illness: [name] [name] is a 82 y.o. male who presents today for electrophysiology evaluation.   The patient has a h/o CAD and is followed by Dr Johnsie Cancel.  He says that he thinks that he had a TIA several years ago.  He wore an event monitor which revealed only PACs.  He has done well since.  He has had infrequent episodes of visual symptoms.  He describes an unusual blindness that will last up to 5 minutes.  His most recent episode occurred over Christmas holiday.  His daughter (who is a PA) noted that his pulse was irregular at the time.  He has since been evaluated by Dr Johnsie Cancel.  He has repeat MRI of brain and referral to neurology (in the next 2 weeks).  He is also referred for consideration of implantable loop recorder to evaluate for arrhythmia as the cause of his symptoms.  The patient remains active.  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  Today, he denies symptoms of palpitations, chest pain, shortness of breath, orthopnea, PND, lower extremity edema, claudication, dizziness, presyncope, syncope, bleeding, or neurologic sequela. The patient is tolerating medications without difficulties and is otherwise without complaint today.    Past Medical History  Diagnosis Date  . History of BPH   . IBS (irritable bowel syndrome)   . HTN (hypertension)   . Anxiety   . Rhinitis   . Hyperlipidemia   . CAD (coronary artery disease)     a. s/p stent to LAD 2008;  b. abnormal ETT 11/13 with VTach => LHC pLAD 30% ISR, mCFX 30%, dRCA 60-75% which had progressed from previous study in 2010 => FFR of RCA 0.92 => med Rx   . Colon polyps   . Diverticular disease of colon   . Hemorrhoids     external and internal  . Hx of echocardiogram      a. Echo 9/11:  EF 55-60%, normal motion, grade 2 diastolic dysfunction, mild MR, mild LAE, mild RAE, PASP 33.  . Carotid stenosis     dopplers 1/14:  0-39% bilateral ICA stenosis  . Stroke, lacunar ([place]) 08/24/2012    Old, 2011 head mri  . Colon polyps     adenomatous   Past Surgical History  Procedure Laterality Date  . Appendectomy  1956  . Nasal sinus surgery  1975  . Hernia repair Right 2005  . Coronary stent placement  2008     Successful PCI of the lesion in the proximal LAD using a Promus   . Percutaneous coronary stent intervention (pci-s) N/A 05/03/2012    Procedure: PERCUTANEOUS CORONARY STENT INTERVENTION (PCI-S);  Surgeon: Sherren Mocha, MD;  Location: [facility] CATH LAB;  Service: Cardiovascular;  Laterality: N/A;     Current Outpatient Prescriptions  Medication Sig Dispense Refill  . ALPRAZolam (XANAX) 0.25 MG tablet take 1/2 to 1 tablet by mouth at bedtime if needed for sleep 30 tablet 0  . aspirin 81 MG tablet Take 81 mg by mouth daily.      . Bisacodyl (DULCOLAX PO) Take 1 tablet by mouth daily as needed (for consitpation).     . diazepam (VALIUM) 5 MG tablet Take 1-2 tablet by mouth 1-2 hours prior to MRI    .  dutasteride (AVODART) 0.5 MG capsule Take 0.5 mg by mouth daily.    . fluticasone (FLONASE) 50 MCG/ACT nasal spray USE 2 SPRAYS IN EACH NOSTRIL DAILY 32 g 11  . ipratropium (ATROVENT) 0.03 % nasal spray Place 2 sprays into both nostrils every 12 (twelve) hours. 90 mL 3  . lisinopril (PRINIVIL,ZESTRIL) 10 MG tablet Take 1 tablet (10 mg total) by mouth daily. 90 tablet 2  . metoprolol succinate (TOPROL XL) 25 MG 24 hr tablet Take 25 mg by mouth daily.    . nitroGLYCERIN (NITROSTAT) 0.4 MG SL tablet Place 0.4 mg under the tongue every 5 (five) minutes as needed for chest pain (up to 3 doses only).    . pantoprazole (PROTONIX) 40 MG tablet Take 1 tablet (40 mg total) by mouth daily. 90 tablet 3  . Psyllium (MEDI-MUCIL PO) Take 240 g by mouth daily. Every morning       . rosuvastatin (CRESTOR) 40 MG tablet Take 1 tablet (40 mg total) by mouth daily. 90 tablet 2  . zolpidem (AMBIEN) 10 MG tablet Take 1 tablet (10 mg total) by mouth at bedtime as needed for sleep. [employer] [id number] 30 tablet 5   No current facility-administered medications for this visit.    Allergies:   Review of patient's allergies indicates no known allergies.   Social History:  The patient  reports that he has never smoked. He has never used smokeless tobacco. He reports that he drinks about 4.2 oz of alcohol per week. He reports that he does not use illicit drugs.   Family History:  The patient's  family history includes Coronary artery disease in his father; Heart attack in his father; Rheum arthritis in his mother. There is no history of Colon cancer.    ROS:  Please see the history of present illness.   All other systems are reviewed and negative.    PHYSICAL EXAM: VS:  BP 124/60 mmHg  Pulse 77  Ht 5\' 10"  (1.778 m)  Wt 147 lb 6.4 oz (66.86 kg)  BMI 21.15 kg/m2 , BMI Body mass index is 21.15 kg/(m^2). GEN: Well nourished, well developed, in no acute distress HEENT: normal Neck: no JVD, carotid bruits, or masses Cardiac: RRR with frequent ectopy; no murmurs, rubs, or gallops,no edema  Respiratory:  clear to auscultation bilaterally, normal work of breathing GI: soft, nontender, nondistended, + BS MS: no deformity or atrophy Skin: warm and dry  Neuro:  Strength and sensation are intact Psych: euthymic mood, full affect  EKG:  EKG from 06/10/15 is reviewed and reveals sinus rhythm with PACs, PVCs, and incomplete RBBB, inferior infact pattern I have reviewed all ekgs from epic and there is no afib on any of them.  Echo from 2011 reviewed Dr Mariana Arn notes are reviewed   Recent Labs: 07/02/2014: ALT 18; BUN 18; Creatinine, Ser 1.02; Hemoglobin 14.6; Platelets 201.0; Potassium 4.2; Sodium 137; TSH 3.39    Lipid Panel     Component Value Date/Time   CHOL 138  07/02/2014 1024   TRIG 151.0* 07/02/2014 1024   HDL 60.90 07/02/2014 1024   CHOLHDL 2 07/02/2014 1024   VLDL 30.2 07/02/2014 1024   LDLCALC 47 07/02/2014 1024   LDLDIRECT 132.2 03/10/2007 0721     Wt Readings from Last 3 Encounters:  06/11/15 147 lb 6.4 oz (66.86 kg)  06/10/15 147 lb (66.679 kg)  01/15/15 149 lb 12.8 oz (67.949 kg)     ASSESSMENT AND PLAN:  1.  TIA The patient has intermittent visual  symptoms that are suggestive of possible TIA.  He states that he was seen by neurology several years ago and was told that he may have had a TIA at that time.  He has MRI and neurology consultation pending. I share Dr Kyla Balzarine concern for arrhythmia as a possibility (particularly with irregular pulse observed by pts daughter recent during symptoms).  Episodes are infrequent and he has previously worn 30 day monitor which was unrevealing.  I agree with Dr Johnsie Cancel that an implantable loop recorder is a good option.  Prior to device implant, I will obtain an echo to evaluate for any structural changes since last echo in 2011 which could predispose to TIA or arrhythmia.  I would also like for him to follow-up with Neurology as already scheduled.  If echo and neurology consult do not reveal obvious cause for his episodes, then we will proceed with implantable loop recorder placement. Pt is instructed to contact my office after his neurology consultation in order to arrange implantable loop recorder placement at that time.  He will continue to follow with Dr Johnsie Cancel also for his routine cardiology care.  Current medicines are reviewed at length with the patient today.   The patient does not have concerns regarding his medicines.  The following changes were made today:  none  Labs/ tests ordered today include:  Orders Placed This Encounter  Procedures  . ECHOCARDIOGRAM COMPLETE     Signed, Thompson Grayer, MD  06/11/2015 11:27 AM     [address] [place] [id number] [phone] (office) [phone] (fax)

## 2015-06-11 NOTE — Telephone Encounter (Signed)
New Message  MEgan, RN from [facility]- is wanting to clarify order from Dr Johnsie Cancel for an MRI- currently sched for 1/27. Please call back and discuss.

## 2015-06-11 NOTE — Patient Instructions (Signed)
Medication Instructions:  Your physician recommends that you continue on your current medications as directed. Please refer to the Current Medication list given to you today.   Labwork: None ordered   Testing/Procedures: Your physician has requested that you have an echocardiogram. Echocardiography is a painless test that uses sound waves to create images of your heart. It provides your doctor with information about the size and shape of your heart and how well your heart's chambers and valves are working. This procedure takes approximately one hour. There are no restrictions for this procedure.    Follow-Up: Your physician recommends that you schedule a follow-up appointment as needed with Dr Rayann Heman  Call after seeing Neuro if they feel loop recorder is appropriate    Any Other Special Instructions Will Be Listed Below (If Applicable).     If you need a refill on your cardiac medications before your next appointment, please call your pharmacy.

## 2015-06-11 NOTE — Telephone Encounter (Signed)
[facility] MRI department are aware that Dr. Johnsie Cancel wants MRI/MRA.

## 2015-06-12 ENCOUNTER — Other Ambulatory Visit: Payer: Self-pay | Admitting: Cardiovascular Disease

## 2015-06-12 DIAGNOSIS — I4729 Other ventricular tachycardia: Secondary | ICD-10-CM

## 2015-06-12 DIAGNOSIS — I472 Ventricular tachycardia: Secondary | ICD-10-CM

## 2015-06-12 DIAGNOSIS — H539 Unspecified visual disturbance: Secondary | ICD-10-CM

## 2015-06-12 DIAGNOSIS — H53123 Transient visual loss, bilateral: Secondary | ICD-10-CM

## 2015-06-13 ENCOUNTER — Ambulatory Visit (HOSPITAL_COMMUNITY)
Admission: RE | Admit: 2015-06-13 | Discharge: 2015-06-13 | Disposition: A | Discharge status: Home / Self Care | Payer: Medicare Other | Source: Ambulatory Visit | Attending: Cardiovascular Disease | Admitting: Cardiovascular Disease

## 2015-06-13 ENCOUNTER — Encounter (HOSPITAL_COMMUNITY): Payer: Self-pay

## 2015-06-13 DIAGNOSIS — Z8673 Personal history of transient ischemic attack (TIA), and cerebral infarction without residual deficits: Secondary | ICD-10-CM | POA: Insufficient documentation

## 2015-06-13 DIAGNOSIS — H53123 Transient visual loss, bilateral: Secondary | ICD-10-CM | POA: Insufficient documentation

## 2015-06-13 DIAGNOSIS — R9082 White matter disease, unspecified: Secondary | ICD-10-CM | POA: Insufficient documentation

## 2015-06-13 DIAGNOSIS — H538 Other visual disturbances: Secondary | ICD-10-CM | POA: Diagnosis not present

## 2015-06-13 DIAGNOSIS — I472 Ventricular tachycardia: Secondary | ICD-10-CM | POA: Diagnosis not present

## 2015-06-13 DIAGNOSIS — J320 Chronic maxillary sinusitis: Secondary | ICD-10-CM | POA: Diagnosis not present

## 2015-06-13 DIAGNOSIS — I4729 Other ventricular tachycardia: Secondary | ICD-10-CM

## 2015-06-13 DIAGNOSIS — H539 Unspecified visual disturbance: Secondary | ICD-10-CM | POA: Diagnosis not present

## 2015-06-24 ENCOUNTER — Ambulatory Visit: Payer: Medicare Other | Admitting: Cardiovascular Disease

## 2015-06-25 ENCOUNTER — Ambulatory Visit (HOSPITAL_COMMUNITY): Payer: Medicare Other | Attending: Cardiovascular Disease

## 2015-06-25 ENCOUNTER — Other Ambulatory Visit: Payer: Self-pay

## 2015-06-25 DIAGNOSIS — I517 Cardiomegaly: Secondary | ICD-10-CM | POA: Diagnosis not present

## 2015-06-25 DIAGNOSIS — E785 Hyperlipidemia, unspecified: Secondary | ICD-10-CM | POA: Insufficient documentation

## 2015-06-25 DIAGNOSIS — I10 Essential (primary) hypertension: Secondary | ICD-10-CM | POA: Diagnosis not present

## 2015-06-25 DIAGNOSIS — G459 Transient cerebral ischemic attack, unspecified: Secondary | ICD-10-CM | POA: Insufficient documentation

## 2015-07-01 ENCOUNTER — Telehealth: Payer: Self-pay | Admitting: Internal Medicine

## 2015-07-01 NOTE — Telephone Encounter (Signed)
I discussed with Dr Rayann Heman and he still wants the patient to be evaluated by Neurology before he has the [facility] implant.  Patient aware and will call me back after appointment.

## 2015-07-01 NOTE — Telephone Encounter (Signed)
Follow Up   Pt returned call. He states its no rush he just thought he would receive a call on 06/30/2015

## 2015-07-02 ENCOUNTER — Telehealth: Payer: Self-pay | Admitting: Internal Medicine

## 2015-07-02 DIAGNOSIS — G459 Transient cerebral ischemic attack, unspecified: Secondary | ICD-10-CM

## 2015-07-02 NOTE — Telephone Encounter (Signed)
Referral ordered

## 2015-07-02 NOTE — Telephone Encounter (Signed)
Patient called in to advise that based on MRI results, dr allred is requesting that he see dr tat. Patient is in need of a provider referral./

## 2015-07-09 ENCOUNTER — Encounter: Payer: Self-pay | Admitting: Internal Medicine

## 2015-07-09 ENCOUNTER — Ambulatory Visit (INDEPENDENT_AMBULATORY_CARE_PROVIDER_SITE_OTHER): Payer: Medicare Other | Admitting: Internal Medicine

## 2015-07-09 ENCOUNTER — Other Ambulatory Visit (INDEPENDENT_AMBULATORY_CARE_PROVIDER_SITE_OTHER): Payer: Medicare Other

## 2015-07-09 VITALS — BP 118/62 | HR 72 | Temp 97.9°F | Resp 16 | Wt 149.0 lb

## 2015-07-09 DIAGNOSIS — E785 Hyperlipidemia, unspecified: Secondary | ICD-10-CM | POA: Diagnosis not present

## 2015-07-09 DIAGNOSIS — F40243 Fear of flying: Secondary | ICD-10-CM | POA: Insufficient documentation

## 2015-07-09 DIAGNOSIS — Z0000 Encounter for general adult medical examination without abnormal findings: Secondary | ICD-10-CM | POA: Diagnosis not present

## 2015-07-09 DIAGNOSIS — I10 Essential (primary) hypertension: Secondary | ICD-10-CM | POA: Diagnosis not present

## 2015-07-09 DIAGNOSIS — I2510 Atherosclerotic heart disease of native coronary artery without angina pectoris: Secondary | ICD-10-CM | POA: Diagnosis not present

## 2015-07-09 LAB — LIPID PANEL
CHOLESTEROL: 136 mg/dL (ref 0–200)
HDL: 62.6 mg/dL (ref >39.00)
LDL Cholesterol: 55 mg/dL (ref 0–99)
NonHDL: 73.07
TRIGLYCERIDES: 88 mg/dL (ref 0.0–149.0)
Total CHOL/HDL Ratio: 2
VLDL: 17.6 mg/dL (ref 0.0–40.0)

## 2015-07-09 LAB — COMPREHENSIVE METABOLIC PANEL
ALBUMIN: 4.1 g/dL (ref 3.5–5.2)
ALK PHOS: 51 U/L (ref 39–117)
ALT: 17 U/L (ref 0–53)
AST: 18 U/L (ref 0–37)
BILIRUBIN TOTAL: 0.6 mg/dL (ref 0.2–1.2)
BUN: 14 mg/dL (ref 6–23)
CO2: 29 mEq/L (ref 19–32)
CREATININE: 1 mg/dL (ref 0.40–1.50)
Calcium: 9.5 mg/dL (ref 8.4–10.5)
Chloride: 107 mEq/L (ref 96–112)
GFR: 76.38 mL/min (ref >60.00)
GLUCOSE: 92 mg/dL (ref 70–99)
Potassium: 4.2 mEq/L (ref 3.5–5.1)
SODIUM: 141 meq/L (ref 135–145)
TOTAL PROTEIN: 6.6 g/dL (ref 6.0–8.3)

## 2015-07-09 LAB — CBC WITH DIFFERENTIAL/PLATELET
BASOS ABS: 0 10*3/uL (ref 0.0–0.1)
Basophils Relative: 0.5 % (ref 0.0–3.0)
EOS ABS: 0.2 10*3/uL (ref 0.0–0.7)
Eosinophils Relative: 3.9 % (ref 0.0–5.0)
HCT: 40 % (ref 39.0–52.0)
Hemoglobin: 13.6 g/dL (ref 13.0–17.0)
LYMPHS ABS: 1.2 10*3/uL (ref 0.7–4.0)
Lymphocytes Relative: 25.2 % (ref 12.0–46.0)
MCHC: 34.1 g/dL (ref 30.0–36.0)
MCV: 89.7 fl (ref 78.0–100.0)
MONOS PCT: 9.7 % (ref 3.0–12.0)
Monocytes Absolute: 0.4 10*3/uL (ref 0.1–1.0)
NEUTROS PCT: 60.7 % (ref 43.0–77.0)
Neutro Abs: 2.8 10*3/uL (ref 1.4–7.7)
Platelets: 184 10*3/uL (ref 150.0–400.0)
RBC: 4.46 Mil/uL (ref 4.22–5.81)
RDW: 14.1 % (ref 11.5–15.5)
WBC: 4.6 10*3/uL (ref 4.0–10.5)

## 2015-07-09 LAB — TSH: TSH: 2.78 u[IU]/mL (ref 0.35–4.50)

## 2015-07-09 MED ORDER — ALPRAZOLAM 0.25 MG PO TABS: ORAL_TABLET | ORAL | Status: DC

## 2015-07-09 NOTE — Progress Notes (Signed)
Pre visit review using our clinic review tool, if applicable. No additional management support is needed unless otherwise documented below in the visit note. 

## 2015-07-09 NOTE — Progress Notes (Signed)
Subjective:  Patient ID: [name] [name], male    DOB: September 04, 1932  Age: 82 y.o. MRN: [id number]  CC: Hypertension; Hyperlipidemia; and Annual Exam   HPI [name] [name] presents for a complete physical. He requests a refill on Xanax which she takes for fear of flying and for the occasional episode of insomnia.  He tells me that his blood pressures been well controlled with a combination of lisinopril and metoprolol, he has had no side effects related to this and he denies any recent episodes of chest pain, shortness of breath, edema, dizziness, lightheadedness or syncope.  He is tolerating his cholesterol medicine well with no muscle or joint aches.  Outpatient Prescriptions Prior to Visit  Medication Sig Dispense Refill  . aspirin 81 MG tablet Take 81 mg by mouth daily.      . Bisacodyl (DULCOLAX PO) Take 1 tablet by mouth daily as needed (for consitpation).     Marland Kitchen dutasteride (AVODART) 0.5 MG capsule Take 0.5 mg by mouth daily.    . fluticasone (FLONASE) 50 MCG/ACT nasal spray USE 2 SPRAYS IN EACH NOSTRIL DAILY 32 g 11  . ipratropium (ATROVENT) 0.03 % nasal spray Place 2 sprays into both nostrils every 12 (twelve) hours. 90 mL 3  . lisinopril (PRINIVIL,ZESTRIL) 10 MG tablet Take 1 tablet (10 mg total) by mouth daily. 90 tablet 2  . metoprolol succinate (TOPROL XL) 25 MG 24 hr tablet Take 25 mg by mouth daily.    . nitroGLYCERIN (NITROSTAT) 0.4 MG SL tablet Place 0.4 mg under the tongue every 5 (five) minutes as needed for chest pain (up to 3 doses only).    . pantoprazole (PROTONIX) 40 MG tablet Take 1 tablet (40 mg total) by mouth daily. 90 tablet 3  . Psyllium (MEDI-MUCIL PO) Take 240 g by mouth daily. Every morning     . rosuvastatin (CRESTOR) 40 MG tablet Take 1 tablet (40 mg total) by mouth daily. 90 tablet 2  . zolpidem (AMBIEN) 10 MG tablet Take 1 tablet (10 mg total) by mouth at bedtime as needed for sleep. [employer] [id number] 30 tablet 5  . ALPRAZolam (XANAX) 0.25 MG tablet  take 1/2 to 1 tablet by mouth at bedtime if needed for sleep (Patient taking differently: take 1/2 to 1 tablet by mouth at bedtime if needed for sleep---patient takes as needed) 30 tablet 0  . diazepam (VALIUM) 5 MG tablet Take 1-2 tablet by mouth 1-2 hours prior to MRI     No facility-administered medications prior to visit.    ROS Review of Systems  Constitutional: Negative.  Negative for fever, chills, diaphoresis, appetite change and fatigue.  HENT: Negative.  Negative for congestion, facial swelling, sinus pressure, sore throat and trouble swallowing.   Eyes: Negative.   Respiratory: Negative.  Negative for cough, choking, chest tightness, shortness of breath and stridor.   Cardiovascular: Negative.  Negative for chest pain, palpitations and leg swelling.  Gastrointestinal: Negative.  Negative for nausea, vomiting, abdominal pain, diarrhea and constipation.  Endocrine: Negative.   Genitourinary: Negative.  Negative for dysuria, urgency, frequency, hematuria and difficulty urinating.  Musculoskeletal: Negative.  Negative for myalgias, back pain, joint swelling, arthralgias and neck pain.  Skin: Negative.  Negative for color change and rash.  Allergic/Immunologic: Negative.   Neurological: Negative.  Negative for dizziness, weakness, light-headedness and headaches.  Hematological: Negative.  Negative for adenopathy. Does not bruise/bleed easily.  Psychiatric/Behavioral: Positive for sleep disturbance. Negative for suicidal ideas, dysphoric mood, decreased concentration and agitation. The  patient is nervous/anxious.     Objective:  BP 118/62 mmHg  Pulse 72  Temp(Src) 97.9 F (36.6 C)  Resp 16  Wt 149 lb (67.586 kg)  SpO2 98%  BP Readings from Last 3 Encounters:  07/10/15 128/70  07/09/15 118/62  06/11/15 124/60    Wt Readings from Last 3 Encounters:  07/10/15 150 lb (68.04 kg)  07/09/15 149 lb (67.586 kg)  06/11/15 147 lb 6.4 oz (66.86 kg)    Physical Exam    Constitutional: He is oriented to person, place, and time. He appears well-developed and well-nourished. No distress.  HENT:  Head: Normocephalic and atraumatic.  Mouth/Throat: Oropharynx is clear and moist. No oropharyngeal exudate.  Eyes: Conjunctivae are normal. Right eye exhibits no discharge. Left eye exhibits no discharge. No scleral icterus.  Neck: Normal range of motion. Neck supple. No JVD present. No tracheal deviation present. No thyromegaly present.  Cardiovascular: Normal rate, regular rhythm, normal heart sounds and intact distal pulses.  Exam reveals no gallop and no friction rub.   No murmur heard. Pulmonary/Chest: Effort normal and breath sounds normal. No stridor. No respiratory distress. He has no wheezes. He has no rales. He exhibits no tenderness.  Abdominal: Soft. Bowel sounds are normal. He exhibits no distension and no mass. There is no tenderness. There is no rebound and no guarding.  Musculoskeletal: Normal range of motion. He exhibits no edema or tenderness.  Lymphadenopathy:    He has no cervical adenopathy.  Neurological: He is oriented to person, place, and time.  Skin: Skin is warm and dry. No rash noted. He is not diaphoretic. No erythema. No pallor.  Psychiatric: He has a normal mood and affect. His behavior is normal. Judgment and thought content normal.  Vitals reviewed.   Lab Results  Component Value Date   WBC 4.6 07/09/2015   HGB 13.6 07/09/2015   HCT 40.0 07/09/2015   PLT 184.0 07/09/2015   GLUCOSE 92 07/09/2015   CHOL 136 07/09/2015   TRIG 88.0 07/09/2015   HDL 62.60 07/09/2015   LDLDIRECT 132.2 03/10/2007   LDLCALC 55 07/09/2015   ALT 17 07/09/2015   AST 18 07/09/2015   NA 141 07/09/2015   K 4.2 07/09/2015   CL 107 07/09/2015   CREATININE 1.00 07/09/2015   BUN 14 07/09/2015   CO2 29 07/09/2015   TSH 2.78 07/09/2015   PSA 0.44 05/10/2011   INR 1.0 04/27/2012    Mr Jodene Nam Head Wo Contrast  06/13/2015  CLINICAL DATA:  Initial  encounter. History of TIA. Occasional visual disturbance. EXAM: MRI HEAD WITHOUT CONTRAST MRA HEAD WITHOUT CONTRAST TECHNIQUE: Multiplanar, multiecho pulse sequences of the brain and surrounding structures were obtained without intravenous contrast. Angiographic images of the head were obtained using MRA technique without contrast. COMPARISON:  12/03/2013 FINDINGS: MRI HEAD FINDINGS Diffusion imaging does not show any acute or subacute infarction. The brainstem and cerebellum are normal. The cerebral hemispheres show a few small foci of T2 and FLAIR signal within the white matter consistent with minimal small vessel disease, less than often seen in persons of this age. No cortical or large vessel territory infarction. No mass lesion, hemorrhage, hydrocephalus or extra-axial collection. No pituitary mass. The patient does have some inflammatory disease affecting the maxillary sinuses with mucosal thickening and layering fluid. MRA HEAD FINDINGS Both internal carotid arteries are widely patent into the brain. The anterior and middle cerebral vessels are normal without proximal stenosis, aneurysm or vascular malformation. Both vertebral arteries are widely patent to  the basilar. No basilar stenosis. Posterior circulation branch vessels are normal. IMPRESSION: No acute intracranial finding. Normal appearance of the brain with the exception of minimal small vessel change of the white matter, less than often seen in healthy individuals of this age. Bilateral maxillary sinus inflammation. Normal intracranial MR angiography of the large and medium size vessels. Electronically Signed   By: Nelson Chimes M.D.   On: 06/13/2015 19:36   Mr Brain Wo Contrast  06/13/2015  CLINICAL DATA:  Initial encounter. History of TIA. Occasional visual disturbance. EXAM: MRI HEAD WITHOUT CONTRAST MRA HEAD WITHOUT CONTRAST TECHNIQUE: Multiplanar, multiecho pulse sequences of the brain and surrounding structures were obtained without  intravenous contrast. Angiographic images of the head were obtained using MRA technique without contrast. COMPARISON:  12/03/2013 FINDINGS: MRI HEAD FINDINGS Diffusion imaging does not show any acute or subacute infarction. The brainstem and cerebellum are normal. The cerebral hemispheres show a few small foci of T2 and FLAIR signal within the white matter consistent with minimal small vessel disease, less than often seen in persons of this age. No cortical or large vessel territory infarction. No mass lesion, hemorrhage, hydrocephalus or extra-axial collection. No pituitary mass. The patient does have some inflammatory disease affecting the maxillary sinuses with mucosal thickening and layering fluid. MRA HEAD FINDINGS Both internal carotid arteries are widely patent into the brain. The anterior and middle cerebral vessels are normal without proximal stenosis, aneurysm or vascular malformation. Both vertebral arteries are widely patent to the basilar. No basilar stenosis. Posterior circulation branch vessels are normal. IMPRESSION: No acute intracranial finding. Normal appearance of the brain with the exception of minimal small vessel change of the white matter, less than often seen in healthy individuals of this age. Bilateral maxillary sinus inflammation. Normal intracranial MR angiography of the large and medium size vessels. Electronically Signed   By: Nelson Chimes M.D.   On: 06/13/2015 19:36    Assessment & Plan:   Dreven was seen today for hypertension, hyperlipidemia and annual exam.  Diagnoses and all orders for this visit:  Atherosclerosis of native coronary artery of native heart without angina pectoris- he has had no recent episodes of chest pain or shortness of breath, his LDL is well-controlled, will continue to modify his risk factors with statin therapy, blood pressure control, and an aspirin a day. He has not used the nitroglycerin recently. -     Lipid panel; Future  Essential  hypertension- his blood pressure is well-controlled on his current regimen, his lites and renal function are normal. -     TSH; Future -     Comprehensive metabolic panel; Future -     CBC with Differential/Platelet; Future  Hyperlipidemia with target LDL less than 70- he is achieved his LDL goal and is doing well on the statin therapy, will continue. -     Lipid panel; Future -     TSH; Future  Fear of flying -     ALPRAZolam (XANAX) 0.25 MG tablet; take 1/2 to 1 tablet by mouth at bedtime if needed for sleep---patient takes as needed  I have discontinued Mr. Kaniecki diazepam. I have also changed his ALPRAZolam. Additionally, I am having him maintain his aspirin, Psyllium (MEDI-MUCIL PO), dutasteride, Bisacodyl (DULCOLAX PO), ipratropium, nitroGLYCERIN, zolpidem, pantoprazole, lisinopril, rosuvastatin, fluticasone, metoprolol succinate, and hydrocortisone cream.  Meds ordered this encounter  Medications  . hydrocortisone cream 0.5 %    Sig: Apply 1 application topically 2 (two) times daily. Patient takes as needed  .  ALPRAZolam (XANAX) 0.25 MG tablet    Sig: take 1/2 to 1 tablet by mouth at bedtime if needed for sleep---patient takes as needed    Dispense:  30 tablet    Refill:  0   See AVS for instructions about healthy living and anticipatory guidance.  Follow-up: Return in about 6 months (around 01/06/2016).  Scarlette Calico, MD

## 2015-07-09 NOTE — Patient Instructions (Signed)

## 2015-07-10 ENCOUNTER — Ambulatory Visit: Payer: Medicare Other | Admitting: Family Medicine

## 2015-07-10 ENCOUNTER — Encounter: Payer: Self-pay | Admitting: Neurology

## 2015-07-10 ENCOUNTER — Ambulatory Visit (INDEPENDENT_AMBULATORY_CARE_PROVIDER_SITE_OTHER): Payer: Medicare Other | Admitting: Neurology

## 2015-07-10 VITALS — BP 128/70 | HR 87 | Ht 70.0 in | Wt 150.0 lb

## 2015-07-10 DIAGNOSIS — H539 Unspecified visual disturbance: Secondary | ICD-10-CM | POA: Diagnosis not present

## 2015-07-10 DIAGNOSIS — I2510 Atherosclerotic heart disease of native coronary artery without angina pectoris: Secondary | ICD-10-CM | POA: Diagnosis not present

## 2015-07-10 NOTE — Assessment & Plan Note (Signed)

## 2015-07-10 NOTE — Addendum Note (Signed)
Addended by: Gerda Diss A on: 07/10/2015 09:19 AM   Modules accepted: Orders

## 2015-07-10 NOTE — Patient Instructions (Signed)
I do not think these episodes are transient ischemic attacks (or mini strokes), given that they are recurrent episodes of the same symptoms for several years.  MRI of the brain and blood vessels do not reveal any abnormality that would explain such recurrent symptoms.  From a neurologic standpoint, they may be ocular migraines, however that is a diagnosis of exclusion (especially since you have no history of migraines/headaches).   My suspicion is very low, but seizures from the back of the brain is possible.  We will check an EEG  Otherwise, I would continue looking for a cardiac source for the symptoms.  Although you have had normal eye exams, you may want to contact your eye doctor to discuss symptoms with him/her and get their opinion/input.

## 2015-07-10 NOTE — Progress Notes (Signed)
NEUROLOGY CONSULTATION NOTE  [name] [name] MRN: [id number] DOB: 21-Feb-1933  Referring provider: Dr. Ronnald Ramp Primary care provider: Dr. Ronnald Ramp  Reason for consult:  Transient visual disturbance  HISTORY OF PRESENT ILLNESS: [name] [name] is an 82 year old right-handed male with CAD s/p LAD stent 2008, HTN, hyperlipidemia, IBS and anxiety who presents for recurrent transient visual disturbance.  History obtained by patient and cardiology notes.  Labs, carotid doppler and echo reports and imaging of brain MRI/MRA reviewed.  For the past 6 years, he has been experiencing recurrent episodes of transient vision loss.   They occur in both eyes.  Suddenly, he loses central vision.  It is not blacking out of vision or light, but it looks like "having just stared at the sun".  They last 15 minutes, but one time it lasted 35 minutes.  There is no associated headache, palpitations, dizziness or focal numbness or weakness.  Initially, he had several episodes in a row and saw a neurologist who thought he may have had TIAs.  He has about 3 to 4 episodes per year and symptoms are habitual.  His last episode in January was witnessed by his daughter who noted that his blood pressure 127/57 and pulse was low at 52.  Pulse regained regular rhythm after about 3 to 5 minutes.  Extensive workup has been unremarkable: MRI of brain in July 2015 showed mild small vessel disease but no acute infarct.  MRI/MRA brain without contrast from 06/13/15 were both unremarkable. Carotid doppler in February 2015 showed 40-59% left ICA stenosis and 1-39% right ICA stenosis.  Repeat carotid doppler from 01/10/14 showed that left ICA velocities have decreased, now 1-39%. Complete echo from 06/25/15 showed EF 55-60% with no regional wall motion abnormalities or atrial septal defect or PFO LDL from 07/02/14 was 47.  Serum glucose was 94.  He takes ASA 81mg  daily for the past 12 to 15 years.  At this point, cardiology is considering an  implantable loop recorder. He has annual eye exams which are unremarkable.  He never discussed symptoms with his eye doctor, however.  PAST MEDICAL HISTORY: Past Medical History  Diagnosis Date  . History of BPH   . IBS (irritable bowel syndrome)   . HTN (hypertension)   . Anxiety   . Rhinitis   . Hyperlipidemia   . CAD (coronary artery disease)     a. s/p stent to LAD 2008;  b. abnormal ETT 11/13 with VTach => LHC pLAD 30% ISR, mCFX 30%, dRCA 60-75% which had progressed from previous study in 2010 => FFR of RCA 0.92 => med Rx   . Colon polyps   . Diverticular disease of colon   . Hemorrhoids     external and internal  . Hx of echocardiogram     a. Echo 9/11:  EF 55-60%, normal motion, grade 2 diastolic dysfunction, mild MR, mild LAE, mild RAE, PASP 33.  . Carotid stenosis     dopplers 1/14:  0-39% bilateral ICA stenosis  . Stroke, lacunar ([place]) 08/24/2012    Old, 2011 head mri  . Colon polyps     adenomatous    PAST SURGICAL HISTORY: Past Surgical History  Procedure Laterality Date  . Appendectomy  1956  . Nasal sinus surgery  1975  . Hernia repair Right 2005  . Coronary stent placement  2008     Successful PCI of the lesion in the proximal LAD using a Promus   . Percutaneous coronary stent intervention (pci-s) N/A 05/03/2012  Procedure: PERCUTANEOUS CORONARY STENT INTERVENTION (PCI-S);  Surgeon: Sherren Mocha, MD;  Location: [facility] CATH LAB;  Service: Cardiovascular;  Laterality: N/A;  . Promus des ok for 3t mri  2008    MEDICATIONS: Current Outpatient Prescriptions on File Prior to Visit  Medication Sig Dispense Refill  . ALPRAZolam (XANAX) 0.25 MG tablet take 1/2 to 1 tablet by mouth at bedtime if needed for sleep---patient takes as needed 30 tablet 0  . aspirin 81 MG tablet Take 81 mg by mouth daily.      . Bisacodyl (DULCOLAX PO) Take 1 tablet by mouth daily as needed (for consitpation).     Marland Kitchen dutasteride (AVODART) 0.5 MG capsule Take 0.5 mg by mouth daily.    .  fluticasone (FLONASE) 50 MCG/ACT nasal spray USE 2 SPRAYS IN EACH NOSTRIL DAILY 32 g 11  . hydrocortisone cream 0.5 % Apply 1 application topically 2 (two) times daily. Patient takes as needed    . ipratropium (ATROVENT) 0.03 % nasal spray Place 2 sprays into both nostrils every 12 (twelve) hours. 90 mL 3  . lisinopril (PRINIVIL,ZESTRIL) 10 MG tablet Take 1 tablet (10 mg total) by mouth daily. 90 tablet 2  . metoprolol succinate (TOPROL XL) 25 MG 24 hr tablet Take 25 mg by mouth daily.    . nitroGLYCERIN (NITROSTAT) 0.4 MG SL tablet Place 0.4 mg under the tongue every 5 (five) minutes as needed for chest pain (up to 3 doses only).    . pantoprazole (PROTONIX) 40 MG tablet Take 1 tablet (40 mg total) by mouth daily. 90 tablet 3  . Psyllium (MEDI-MUCIL PO) Take 240 g by mouth daily. Every morning     . rosuvastatin (CRESTOR) 40 MG tablet Take 1 tablet (40 mg total) by mouth daily. 90 tablet 2  . zolpidem (AMBIEN) 10 MG tablet Take 1 tablet (10 mg total) by mouth at bedtime as needed for sleep. [employer] [id number] 30 tablet 5   No current facility-administered medications on file prior to visit.    ALLERGIES: No Known Allergies  FAMILY HISTORY: Family History  Problem Relation Age of Onset  . Coronary artery disease Father   . Heart attack Father   . Rheum arthritis Mother   . Colon cancer Neg Hx     SOCIAL HISTORY: Social History   Social History  . Marital Status: Married    Spouse Name: N/A  . Number of Children: 3  . Years of Education: 16   Occupational History  . businessman, real Data processing manager    Social History Main Topics  . Smoking status: Never Smoker   . Smokeless tobacco: Never Used  . Alcohol Use: 4.2 oz/week    7 Glasses of wine per week     Comment: 1-2 drinks daily  . Drug Use: No  . Sexual Activity:    Partners: Female   Other Topics Concern  . 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 He has two daughters, 1 in [place] 1 in [place] (Dec '12) and a son who works with him. Several grandchildren.Reports that he spends a good deal of time at his home in [place] which he greatly enjoys and his home in the [place]. Enjoys driving his BMW [place]ible when [place].      ACP - Yes CPR, yes for short-term mechanical ventilation for reversible disease. Recommended [employer]  for consideration.    REVIEW OF SYSTEMS: Constitutional: No fevers, chills, or sweats, no generalized fatigue, change in appetite Eyes: No visual changes, double vision, eye pain Ear, nose and throat: No hearing loss, ear pain, nasal congestion, sore throat Cardiovascular: No chest pain, palpitations Respiratory:  No shortness of breath at rest or with exertion, wheezes GastrointestinaI: No nausea, vomiting, diarrhea, abdominal pain, fecal incontinence Genitourinary:  No dysuria, urinary retention or frequency Musculoskeletal:  No neck pain, back pain Integumentary: No rash, pruritus, skin lesions Neurological: as above Psychiatric: No depression, insomnia, anxiety Endocrine: No palpitations, fatigue, diaphoresis, mood swings, change in appetite, change in weight, increased thirst Hematologic/Lymphatic:  No anemia, purpura, petechiae. Allergic/Immunologic: no itchy/runny eyes, nasal congestion, recent allergic reactions, rashes  PHYSICAL EXAM: Filed Vitals:   07/10/15 0751  BP: 128/70  Pulse: 87   General: No acute distress.  Patient appears well-groomed.  Head:  Normocephalic/atraumatic Eyes:  fundi unremarkable, without vessel changes, exudates, hemorrhages or papilledema. Neck: supple, no paraspinal tenderness, full range of motion Back: No paraspinal tenderness Heart: regular rate and rhythm Lungs: Clear to auscultation bilaterally. Vascular: No carotid bruits. Neurological Exam: Mental  status: alert and oriented to person, place, and time, recent and remote memory intact, fund of knowledge intact, attention and concentration intact, speech fluent and not dysarthric, language intact. Cranial nerves: CN I: not tested CN II: pupils equal, round and reactive to light, visual fields intact, fundi unremarkable, without vessel changes, exudates, hemorrhages or papilledema. CN III, IV, VI:  full range of motion, no nystagmus, no ptosis CN V: facial sensation intact CN VII: upper and lower face symmetric CN VIII: hearing intact CN IX, X: gag intact, uvula midline CN XI: sternocleidomastoid and trapezius muscles intact CN XII: tongue midline Bulk & Tone: normal, no fasciculations. Motor:  5/5 throughout  Sensation:  Pinprick and vibration sensation intact. Deep Tendon Reflexes:  2+ throughout, toes downgoing.  Finger to nose testing:  Without dysmetria.  Heel to shin:  Without dysmetria.  Gait:  Normal station and stride.  Able to turn and tandem walk. Romberg negative.  IMPRESSION: Recurrent transient vision loss.  I do not think these events are TIAs as it would be highly unlikely that a person would have recurrent TIAs with habitual symptoms.  This would suggest he is having TIAs involving the same location of the brain and he would have probably had a large stroke years ago.  Without a focal arterial stenosis or occlusion, this would be highly unlikely.  Ocular migraine is possible, but he has no history of migraines/headaches and this would be a diagnosis of exclusion.  Occipital seizures are possible but not probable.  My suspicion is low.  From a neurologic standpoint, I would most favor ocular migraines, however cardiac source should be ruled out as well.  PLAN: He may want to consider discussing symptoms with his eye doctor to get his/her opinion. Although suspicion is low, will get EEG I would still investigate cardiac source, given drop in HR during a spell. Follow up as  needed/pending EEG results.  Thank you for allowing me to take part in the care of this patient.  Metta Clines, DO  CC:  Scarlette Calico, MD  [name], MD  Thompson Grayer, MD

## 2015-07-14 ENCOUNTER — Other Ambulatory Visit: Payer: Medicare Other

## 2015-07-16 ENCOUNTER — Ambulatory Visit (INDEPENDENT_AMBULATORY_CARE_PROVIDER_SITE_OTHER): Payer: Medicare Other | Admitting: Neurology

## 2015-07-16 DIAGNOSIS — H539 Unspecified visual disturbance: Secondary | ICD-10-CM

## 2015-07-16 NOTE — Procedures (Signed)
ELECTROENCEPHALOGRAM REPORT  Date of Study: 07/16/2015  Patient's Name: [name] [name] MRN: [id number] Date of Birth: November 08, 1932  Indication: Recurrent transient visual disturbance  Medications: Xanax Ambient Metoprolol Lisinopril Crestor  Technical Summary: This is a multichannel digital EEG recording, using the international 10-20 placement system with electrodes applied with paste and impedances below 5000 ohms.    Description: The EEG background is symmetric, with a well-developed posterior dominant rhythm of 9 Hz, which is reactive to eye opening and closing.  Diffuse beta activity is seen, with a bilateral frontal preponderance.  No focal or generalized abnormalities are seen.  No focal or generalized epileptiform discharges are seen.  Stage II sleep is not seen.  Hyperventilation was not performed.  Photic stimulation was performed, and produced no abnormalities.  ECG revealed normal cardiac rate but irregular rhythm.  Impression: This is a normal routine EEG of the awake and drowsy, with activating procedures.  A normal study does not rule out the possibility of a seizure disorder in this patient.   Adam R. Tomi Likens, DO

## 2015-07-17 ENCOUNTER — Telehealth: Payer: Self-pay

## 2015-07-17 NOTE — Telephone Encounter (Signed)
-----   Message from Pieter Partridge, DO sent at 07/17/2015  8:28 AM EST ----- EEG is normal.  I don't suspect that he is having recurrent TIAs.  I would continue aspirin 81mg  daily but would investigate any possible cardiac source for symptoms.

## 2015-07-17 NOTE — Telephone Encounter (Signed)
Left message on machine for pt to return call to the office. Mychart message sent as well.  

## 2015-07-22 ENCOUNTER — Other Ambulatory Visit (INDEPENDENT_AMBULATORY_CARE_PROVIDER_SITE_OTHER): Payer: Medicare Other

## 2015-07-22 ENCOUNTER — Encounter: Payer: Self-pay | Admitting: Family Medicine

## 2015-07-22 ENCOUNTER — Ambulatory Visit (INDEPENDENT_AMBULATORY_CARE_PROVIDER_SITE_OTHER): Payer: Medicare Other | Admitting: Family Medicine

## 2015-07-22 VITALS — BP 102/60 | HR 69 | Ht 70.0 in | Wt 150.0 lb

## 2015-07-22 DIAGNOSIS — M6580 Other synovitis and tenosynovitis, unspecified site: Secondary | ICD-10-CM | POA: Diagnosis not present

## 2015-07-22 DIAGNOSIS — M7071 Other bursitis of hip, right hip: Secondary | ICD-10-CM | POA: Diagnosis not present

## 2015-07-22 DIAGNOSIS — M25551 Pain in right hip: Secondary | ICD-10-CM

## 2015-07-22 DIAGNOSIS — M76899 Other specified enthesopathies of unspecified lower limb, excluding foot: Secondary | ICD-10-CM

## 2015-07-22 DIAGNOSIS — I2510 Atherosclerotic heart disease of native coronary artery without angina pectoris: Secondary | ICD-10-CM

## 2015-07-22 NOTE — Progress Notes (Signed)
Pre visit review using our clinic review tool, if applicable. No additional management support is needed unless otherwise documented below in the visit note. 

## 2015-07-22 NOTE — Assessment & Plan Note (Signed)
Impression an injection under ultrasound guidance today. Tolerated the procedure well. We discussed icing protocol, compression, patient given home exercises. We discussed which activities to an which was potentially avoid. Patient will come back and see me again in 4 weeks if not completely resolved.

## 2015-07-22 NOTE — Patient Instructions (Signed)
Great to see you  You are doing great  Look into [email or web address] for size medium thigh compression sleeve Exercises 3 times a week.  Have a hiatus from anything other than walking for next week Ice 20 minutes 2 times daily. Usually after activity and before bed. Marland KitchenzpenSee me again in 4 weeks if not perfect!

## 2015-07-22 NOTE — Progress Notes (Signed)
CC: Right buttocks pain   HPI: Has been 3 years since we seen patient. Patient did have a hamstring tendinopathy on the left side previously. Now is having some very similar presentation of the right side. States that he feels that there is some tightness. Continues to remain active. Patient states though that this is stopped him from such things as touching his toes. Patient states that going downhill can sometimes give him some discomfort. Seems to be proximal in the buttocks area. Denies any back pain associated with it. Denies any weakness or numbness. Rates the severity of pain a 7 out of 10. Did respond very well to an injection previously on the contralateral side 3 years ago and continues to not have any significant pain.  Past Medical History  Diagnosis Date  . History of BPH   . IBS (irritable bowel syndrome)   . HTN (hypertension)   . Anxiety   . Rhinitis   . Hyperlipidemia   . CAD (coronary artery disease)     a. s/p stent to LAD 2008;  b. abnormal ETT 11/13 with VTach => LHC pLAD 30% ISR, mCFX 30%, dRCA 60-75% which had progressed from previous study in 2010 => FFR of RCA 0.92 => med Rx   . Colon polyps   . Diverticular disease of colon   . Hemorrhoids     external and internal  . Hx of echocardiogram     a. Echo 9/11:  EF 55-60%, normal motion, grade 2 diastolic dysfunction, mild MR, mild LAE, mild RAE, PASP 33.  . Carotid stenosis     dopplers 1/14:  0-39% bilateral ICA stenosis  . Stroke, lacunar ([place]) 08/24/2012    Old, 2011 head mri  . Colon polyps     adenomatous   Past Surgical History  Procedure Laterality Date  . Appendectomy  1956  . Nasal sinus surgery  1975  . Hernia repair Right 2005  . Coronary stent placement  2008     Successful PCI of the lesion in the proximal LAD using a Promus   . Percutaneous coronary stent intervention (pci-s) N/A 05/03/2012    Procedure: PERCUTANEOUS CORONARY STENT INTERVENTION (PCI-S);  Surgeon: Sherren Mocha, MD;  Location:  [facility] CATH LAB;  Service: Cardiovascular;  Laterality: N/A;  . Promus des ok for [employer]  2008   Social History  Substance Use Topics  . Smoking status: Never Smoker   . Smokeless tobacco: Never Used  . Alcohol Use: 4.2 oz/week    7 Glasses of wine per week     Comment: 1-2 drinks daily   No Known Allergies no known drug allergies Family History  Problem Relation Age of Onset  . Coronary artery disease Father   . Heart attack Father   . Rheum arthritis Mother   . Colon cancer Neg Hx    .  Past medical, surgical, family and social history reviewed. Medications reviewed all in the electronic medical record.   Review of Systems: No headache, visual changes, nausea, vomiting, diarrhea, constipation, dizziness, abdominal pain, skin rash, fevers, chills, night sweats, weight loss, swollen lymph nodes, body aches, joint swelling, muscle aches, chest pain, shortness of breath, mood changes.   Objective:    Blood pressure 102/60, pulse 69, height 5\' 10"  (1.778 m), weight 150 lb (68.04 kg), SpO2 96 %.   General: No apparent distress alert and oriented x3 mood and affect normal, dressed appropriately.  HEENT: Pupils equal, extraocular movements intact Respiratory: Patient's speak in full sentences  and does not appear short of breath Cardiovascular: No lower extremity edema, non tender, no erythema Skin: Warm dry intact with no signs of infection or rash on extremities or on axial skeleton. Abdomen: Soft nontender Neuro: Cranial nerves II through XII are intact, neurovascularly intact in all extremities with 2+ DTRs and 2+ pulses. Lymph: No lymphadenopathy of posterior or anterior cervical chain or axillae bilaterally.  Gait normal with good balance and coordination.  MSK: Non tender with full range of motion and good stability and symmetric strength and tone of shoulders, elbows, wrist, hip, knee and ankles bilaterally.  Hip: Left ROM IR: 35 Deg, ER: 45 Deg, Flexion: 120 Deg, Extension: 100  Deg, Abduction: 45 Deg, Adduction: 45 Deg Strength IR: 5/5, ER: 5/5, Flexion: 5/5, Extension: 5/5, Abduction: 5/5, Adduction: 5/5 Pelvic alignment unremarkable to inspection and palpation. Standing hip rotation and gait without trendelenburg sign / unsteadiness. Greater trochanter without tenderness to palpation. Mildly positive Corky Sox been negative Fadir Patient is tender to palpation over ischial tuberosity at the insertion of the hamstring. Minimal tenderness over the left SI joint Patient does have mild tightness of the hamstrings bilaterally   Muscular skeletal ultrasound was performed and interpreted by Hulan Saas, M today.  Limited ultrasound shows that patient ischial tuberosity does have hypoechoic changes surrounding it that does correspond well to a bursitis. Not as large as the contralateral side 3 years ago but still a large area. Increasing Doppler flow noted. No avulsion fracture noted..  Impression: Ischial bursitis with chronic proximal hamstring tendinopathy.  Procedure: Real-time Ultrasound Guided Injection of right ischial tuberosity bursitis Device: GE Logiq E  Ultrasound guided injection is preferred based studies that show increased duration, increased effect, greater accuracy, decreased procedural pain, increased response rate, and decreased cost with ultrasound guided versus blind injection.  Verbal informed consent obtained.  Time-out conducted.  Noted no overlying erythema, induration, or other signs of local infection.  Skin prepped in a sterile fashion.  Local anesthesia: Topical Ethyl chloride.  With sterile technique and under real time ultrasound guidance:  Injected 4 cc of 0.5% Marcaine and 1 cc of Kenalog 40 mg/dL into the ischial bursa. Pictures taken. Completed without difficulty  Pain immediately resolved suggesting accurate placement of the medication.  Advised to call if fevers/chills, erythema, induration, drainage, or persistent bleeding.   Images permanently stored and available for review in the ultrasound unit.  Impression: Technically successful ultrasound guided injection.      Impression and Recommendations:     This case required medical decision making of moderate complexity.

## 2015-08-19 ENCOUNTER — Other Ambulatory Visit: Payer: Self-pay

## 2015-08-19 DIAGNOSIS — F40243 Fear of flying: Secondary | ICD-10-CM

## 2015-08-19 MED ORDER — ALPRAZOLAM 0.25 MG PO TABS: ORAL_TABLET | ORAL | Status: DC

## 2015-08-19 NOTE — Telephone Encounter (Signed)
rf rq from [facility] for alprazolam. pls advise

## 2015-08-19 NOTE — Telephone Encounter (Signed)
Rx written.

## 2015-08-19 NOTE — Telephone Encounter (Signed)
fxd

## 2015-08-21 ENCOUNTER — Telehealth: Payer: Self-pay

## 2015-08-21 DIAGNOSIS — L570 Actinic keratosis: Secondary | ICD-10-CM | POA: Diagnosis not present

## 2015-08-21 DIAGNOSIS — D692 Other nonthrombocytopenic purpura: Secondary | ICD-10-CM | POA: Diagnosis not present

## 2015-08-21 DIAGNOSIS — Z85828 Personal history of other malignant neoplasm of skin: Secondary | ICD-10-CM | POA: Diagnosis not present

## 2015-08-21 DIAGNOSIS — D1801 Hemangioma of skin and subcutaneous tissue: Secondary | ICD-10-CM | POA: Diagnosis not present

## 2015-08-21 DIAGNOSIS — D485 Neoplasm of uncertain behavior of skin: Secondary | ICD-10-CM | POA: Diagnosis not present

## 2015-08-21 DIAGNOSIS — Z85820 Personal history of malignant melanoma of skin: Secondary | ICD-10-CM | POA: Diagnosis not present

## 2015-08-21 DIAGNOSIS — L218 Other seborrheic dermatitis: Secondary | ICD-10-CM | POA: Diagnosis not present

## 2015-08-21 DIAGNOSIS — L308 Other specified dermatitis: Secondary | ICD-10-CM | POA: Diagnosis not present

## 2015-08-21 DIAGNOSIS — L821 Other seborrheic keratosis: Secondary | ICD-10-CM | POA: Diagnosis not present

## 2015-08-21 MED ORDER — ZOLPIDEM TARTRATE 10 MG PO TABS: 10.0000 mg | ORAL_TABLET | Freq: Every evening | ORAL | Status: DC | PRN

## 2015-08-21 NOTE — Telephone Encounter (Signed)
Please advise, patient is requesting refill on zolpidem

## 2015-08-21 NOTE — Telephone Encounter (Signed)
rx written

## 2015-08-21 NOTE — Addendum Note (Signed)
Addended by: [name] on: 08/21/2015 05:18 PM   Modules accepted: Orders

## 2015-08-22 NOTE — Telephone Encounter (Signed)
faxe

## 2015-09-08 NOTE — Progress Notes (Signed)
Patient ID: [name] [name], male   DOB: 1932-10-12, 82 y.o.   MRN: [id number]   He has a hx of CAD, s/p stent to the LAD in 2008, HTN, HL, IBS, anxiety. Echo 9/11: EF 55-60%, normal motion, grade 2 diastolic dysfunction, mild MR, mild LAE, mild RAE, PASP 33. He saw me  in 03/2012 and noted throat pain intermittently. He was set up for an ETT. ETT 04/27/12: Abnormal with episodes of ventricular tachycardia-longest 18 beats. He was set up for cardiac catheterization. LHC 05/01/12: pLAD 30% ISR, mCFX 30%, dRCA 60-75% which had progressed from previous study in 2010. Patient was loaded with Plavix and brought back 05/03/12  for pressure wire analysis. FFR at peak hyperemia was 0.92 which was felt to be hemodynamically insignificant. Continued medical therapy was recommended. He was placed on Toprol and his Plavix was stopped.   Doing well since being on Toprol mild exertional dyspnea and occasional chest pressure when walking at beach    Continues to have episodes of transient visual loss.  ? Occular migraines Been going on for over 3-4 years  Recently daughter who is a PA indicated pulse was irregular With episode.  Lasted 3-5 minutes.  Denies associated headache , speech difficulty, or other focal neuro signs Has seen [facility] before  MRI reviewed 11/2013 and  Mild small vessel disease  F/U MRI/MRA reviewed and no abnormalities.  Seen by Dr Rayann Heman for ILR He wanted patient seen by neurology first  Seen by Dr Tomi Likens 2/23. F/U EEG was normal with no epileptic foci  Was kind enough to bring me a bottle of [place] today Daughter was in a life threatening car accident in [place] and has finally recovered after 3 weeks   01/11/14  1-39% bilateral carotid disease  04/23/14 Myovue Reviewed  Overall Impression: Low risk stress nuclear study with mild basal septal as well as basal inferolateral defect seen at both rest and stress with associated minimal ischemia.   LV Ejection Fraction: 54%. LV Wall  Motion: NL LV Function; NL Wall Motion   ROS: Denies fever, malais, weight loss, blurry vision, decreased visual acuity, cough, sputum, SOB, hemoptysis, pleuritic pain, palpitaitons, heartburn, abdominal pain, melena, lower extremity edema, claudication, or rash.  All other systems reviewed and negative  General: Affect appropriate Healthy:  appears stated age 82: normal Neck supple with no adenopathy JVP normal no bruits no thyromegaly Lungs clear with no wheezing and good diaphragmatic motion Heart:  S1/S2 no murmur, no rub, gallop or click PMI normal Abdomen: benighn, BS positve, no tenderness, no AAA no bruit.  No HSM or HJR Distal pulses intact with no bruits No edema Neuro non-focal Skin warm and dry No muscular weakness   Current Outpatient Prescriptions  Medication Sig Dispense Refill  . ALPRAZolam (XANAX) 0.25 MG tablet take 1/2 to 1 tablet by mouth at bedtime if needed for sleep---patient takes as needed 90 tablet 1  . aspirin 81 MG tablet Take 81 mg by mouth daily.      . Bisacodyl (DULCOLAX PO) Take 1 tablet by mouth daily as needed (for consitpation).     . clobetasol (TEMOVATE) 0.05 % external solution APPLY ON THE SCALP NIGHTLY  0  . diazepam (VALIUM) 5 MG tablet Take 5 mg by mouth every 6 (six) hours as needed for anxiety.     . dutasteride (AVODART) 0.5 MG capsule Take 0.5 mg by mouth daily.    . fluticasone (FLONASE) 50 MCG/ACT nasal spray USE 2 SPRAYS IN  EACH NOSTRIL DAILY 32 g 11  . hydrocortisone 2.5 % cream apply to FACE twice a day  0  . hydrocortisone cream 0.5 % Apply 1 application topically 2 (two) times daily. Patient takes as needed    . ipratropium (ATROVENT) 0.03 % nasal spray Place 2 sprays into both nostrils every 12 (twelve) hours. 90 mL 3  . lisinopril (PRINIVIL,ZESTRIL) 10 MG tablet Take 1 tablet (10 mg total) by mouth daily. 90 tablet 2  . metoprolol succinate (TOPROL XL) 25 MG 24 hr tablet Take 25 mg by mouth daily.    . nitroGLYCERIN  (NITROSTAT) 0.4 MG SL tablet Place 0.4 mg under the tongue every 5 (five) minutes as needed for chest pain (up to 3 doses only).    . pantoprazole (PROTONIX) 40 MG tablet Take 1 tablet (40 mg total) by mouth daily. 90 tablet 3  . Psyllium (MEDI-MUCIL PO) Take 240 g by mouth daily. Every morning     . rosuvastatin (CRESTOR) 40 MG tablet Take 1 tablet (40 mg total) by mouth daily. 90 tablet 2  . triamcinolone cream (KENALOG) 0.1 % apply ON THE SKIN TWICE A DAY APPLY TO BACK AND LOWER LEGS TWICE A DAY FOR 2 TO 4 WEEKS  0  . zolpidem (AMBIEN) 10 MG tablet Take 1 tablet (10 mg total) by mouth at bedtime as needed for sleep. [employer] [id number] 30 tablet 5   No current facility-administered medications for this visit.    Allergies  Review of patient's allergies indicates no known allergies.  Electrocardiogram:  04/18/14  SR rate 33  ICRBBB old IMI possible nonspecific ST T wave changes  no change from 2014  06/10/15  SR rate 89 PAC/PVC ICRBBB old IMI No afib.    Assessment and Plan CAD: Stable with no angina and good activity level.  Continue medical Rx Stent LAD 2008  Moderate distal RCA with normal flow wire 04/2012   Chol: Lab Results  Component Value Date   LDLCALC 55 07/09/2015    GERD:  Continue protonix   Insmonia:  Chronic on ambien f/u Dr Ronnald Ramp  Visual Disturbance:  MRI/MRA normal EEG normal has been seen by neurology No recurrence will wait until he has more  Symptoms to refer for ILR    F/U with me in 6 months   [name]

## 2015-09-09 ENCOUNTER — Encounter: Payer: Self-pay | Admitting: Cardiovascular Disease

## 2015-09-09 ENCOUNTER — Ambulatory Visit (INDEPENDENT_AMBULATORY_CARE_PROVIDER_SITE_OTHER): Payer: Medicare Other | Admitting: Cardiovascular Disease

## 2015-09-09 VITALS — BP 110/60 | HR 60 | Ht 70.0 in | Wt 148.0 lb

## 2015-09-09 DIAGNOSIS — I2510 Atherosclerotic heart disease of native coronary artery without angina pectoris: Secondary | ICD-10-CM | POA: Diagnosis not present

## 2015-09-09 DIAGNOSIS — I10 Essential (primary) hypertension: Secondary | ICD-10-CM

## 2015-09-09 NOTE — Patient Instructions (Signed)

## 2015-09-16 DIAGNOSIS — H2513 Age-related nuclear cataract, bilateral: Secondary | ICD-10-CM | POA: Diagnosis not present

## 2015-09-16 DIAGNOSIS — H5000 Unspecified esotropia: Secondary | ICD-10-CM | POA: Diagnosis not present

## 2015-09-16 DIAGNOSIS — H5213 Myopia, bilateral: Secondary | ICD-10-CM | POA: Diagnosis not present

## 2015-09-24 ENCOUNTER — Telehealth: Payer: Self-pay | Admitting: *Deleted

## 2015-09-24 NOTE — Telephone Encounter (Signed)
Called patient to discuss LINQ.  He says he thought he and Dr Johnsie Cancel decided to wait for now.  He has not had any problems since Dec and would like to hold off.  He said he will call me to schedule should he have any more problems.

## 2015-10-15 DIAGNOSIS — Z85820 Personal history of malignant melanoma of skin: Secondary | ICD-10-CM | POA: Diagnosis not present

## 2015-10-15 DIAGNOSIS — Z85828 Personal history of other malignant neoplasm of skin: Secondary | ICD-10-CM | POA: Diagnosis not present

## 2015-10-15 DIAGNOSIS — L570 Actinic keratosis: Secondary | ICD-10-CM | POA: Diagnosis not present

## 2015-10-20 ENCOUNTER — Other Ambulatory Visit: Payer: Self-pay

## 2015-10-20 ENCOUNTER — Ambulatory Visit (INDEPENDENT_AMBULATORY_CARE_PROVIDER_SITE_OTHER): Payer: Medicare Other | Admitting: Family Medicine

## 2015-10-20 ENCOUNTER — Encounter: Payer: Self-pay | Admitting: Family Medicine

## 2015-10-20 VITALS — BP 124/72 | HR 96 | Ht 70.0 in | Wt 149.0 lb

## 2015-10-20 DIAGNOSIS — M25551 Pain in right hip: Secondary | ICD-10-CM | POA: Diagnosis not present

## 2015-10-20 DIAGNOSIS — I2510 Atherosclerotic heart disease of native coronary artery without angina pectoris: Secondary | ICD-10-CM | POA: Diagnosis not present

## 2015-10-20 DIAGNOSIS — S76391A Other specified injury of muscle, fascia and tendon of the posterior muscle group at thigh level, right thigh, initial encounter: Secondary | ICD-10-CM

## 2015-10-20 MED ORDER — TRAMADOL HCL 50 MG PO TABS: 50.0000 mg | ORAL_TABLET | Freq: Two times a day (BID) | ORAL | Status: DC | PRN

## 2015-10-20 MED ORDER — MELOXICAM 15 MG PO TABS
15.0000 mg | ORAL_TABLET | Freq: Every day | ORAL | Status: DC
Start: 2015-10-20 — End: 2017-08-03

## 2015-10-20 MED ORDER — CYCLOBENZAPRINE HCL 10 MG PO TABS: 10.0000 mg | ORAL_TABLET | Freq: Three times a day (TID) | ORAL | Status: DC | PRN

## 2015-10-20 NOTE — Progress Notes (Signed)
CC: Right buttocks pain follow-up  HPI: Patient was seen 3 months ago and did have more of a hamstring tendinopathy. One week ago he was walking and not have any significant pain and then unfortunately slipped. Since then has had worsening pain. Seems to be more in the buttocks area. Did not have any swelling but states that he did notice some mild weakness. Patient states that it does feel similar to what it was prior to the injection. Maybe some mild weakness with it. Denies any back pain. Rates the severity of pain a 6 out of 10. Concern continues he is going to be traveling out of the country for the next 3 weeks.  Past Medical History  Diagnosis Date  . History of BPH   . IBS (irritable bowel syndrome)   . HTN (hypertension)   . Anxiety   . Rhinitis   . Hyperlipidemia   . CAD (coronary artery disease)     a. s/p stent to LAD 2008;  b. abnormal ETT 11/13 with VTach => LHC pLAD 30% ISR, mCFX 30%, dRCA 60-75% which had progressed from previous study in 2010 => FFR of RCA 0.92 => med Rx   . Colon polyps   . Diverticular disease of colon   . Hemorrhoids     external and internal  . Hx of echocardiogram     a. Echo 9/11:  EF 55-60%, normal motion, grade 2 diastolic dysfunction, mild MR, mild LAE, mild RAE, PASP 33.  . Carotid stenosis     dopplers 1/14:  0-39% bilateral ICA stenosis  . Stroke, lacunar ([place]) 08/24/2012    Old, 2011 head mri  . Colon polyps     adenomatous   Past Surgical History  Procedure Laterality Date  . Appendectomy  1956  . Nasal sinus surgery  1975  . Hernia repair Right 2005  . Coronary stent placement  2008     Successful PCI of the lesion in the proximal LAD using a Promus   . Percutaneous coronary stent intervention (pci-s) N/A 05/03/2012    Procedure: PERCUTANEOUS CORONARY STENT INTERVENTION (PCI-S);  Surgeon: Sherren Mocha, MD;  Location: [facility] CATH LAB;  Service: Cardiovascular;  Laterality: N/A;  . Promus des ok for [employer]  2008   Social History   Substance Use Topics  . Smoking status: Never Smoker   . Smokeless tobacco: Never Used  . Alcohol Use: 4.2 oz/week    7 Glasses of wine per week     Comment: 1-2 drinks daily   No Known Allergies no known drug allergies Family History  Problem Relation Age of Onset  . Coronary artery disease Father   . Heart attack Father   . Rheum arthritis Mother   . Colon cancer Neg Hx    .  Past medical, surgical, family and social history reviewed. Medications reviewed all in the electronic medical record.   Review of Systems: No headache, visual changes, nausea, vomiting, diarrhea, constipation, dizziness, abdominal pain, skin rash, fevers, chills, night sweats, weight loss, swollen lymph nodes, body aches, joint swelling, muscle aches, chest pain, shortness of breath, mood changes.   Objective:    Blood pressure 124/72, pulse 96, height 5\' 10"  (1.778 m), weight 149 lb (67.586 kg), SpO2 94 %.   General: No apparent distress alert and oriented x3 mood and affect normal, dressed appropriately.  HEENT: Pupils equal, extraocular movements intact Respiratory: Patient's speak in full sentences and does not appear short of breath Cardiovascular: No lower extremity edema,  non tender, no erythema Skin: Warm dry intact with no signs of infection or rash on extremities or on axial skeleton. Abdomen: Soft nontender Neuro: Cranial nerves II through XII are intact, neurovascularly intact in all extremities with 2+ DTRs and 2+ pulses. Lymph: No lymphadenopathy of posterior or anterior cervical chain or axillae bilaterally.  Gait normal with good balance and coordination.  MSK: Non tender with full range of motion and good stability and symmetric strength and tone of shoulders, elbows, wrist, hip, knee and ankles bilaterally.  Hip: Right ROM IR: 35 Deg, ER: 45 Deg, Flexion: 120 Deg, Extension: 100 Deg, Abduction: 45 Deg, Adduction: 45 Deg Strength IR: 5/5, ER: 5/5, Flexion: 5/5, Extension: 5/5,  Abduction: 5/5, Adduction: 5/5 Pelvic alignment unremarkable to inspection and palpation. Standing hip rotation and gait without trendelenburg sign / unsteadiness. Greater trochanter without tenderness to palpation. Mildly positive Corky Sox been negative Fadir Patient is tender to palpation over ischial tuberosity at the insertion of the hamstring.  Continued tightness in the hamstrings bilaterally. Mild weakness on the right side compared to the contralateral side  Muscular skeletal ultrasound was performed and interpreted by Hulan Saas, M today.  Limited ultrasound shows that patient does have a small avulsion of the ischial tuberosity. This is different than previous exam. Significant increase in hypoechoic changes and increasing Doppler flow. Patient also has approximately a 40% tear of the hamstring tendon at the origin. Impression: Avulsion tear of the right proximal hamstring and ischial insertion.       Impression and Recommendations:     This case required medical decision making of moderate complexity.

## 2015-10-20 NOTE — Progress Notes (Signed)
Pre visit review using our clinic review tool, if applicable. No additional management support is needed unless otherwise documented below in the visit note. 

## 2015-10-20 NOTE — Patient Instructions (Signed)
Good to see you  Ice 20 minutes 2 times daily. Usually after activity and before bed. Compression sleeve to the thigh with walking could help.  Would consider [facility] or [employer] or [employer] Meloxicam daily for 10 days then as needed Flexeril at night or on the plane.  Tramadol 50mg  up to 2 times a day for severe pain  Have a great trip See me again in 3 weeks to make sure healing.

## 2015-10-20 NOTE — Assessment & Plan Note (Signed)
Patient does have what appears to be more of an avulsion. Does not seem to be as much as the tendinitis as it was previously. With this being a week I think it is giving her more difficulty. We discussed with patient the left leg was to potentially heal before we try an injection. At follow-up in 3 weeks we can consider. Patient is going try compression, anti-inflammatories, tramadol for breakthrough pain and we'll see how patient response. In 3 weeks if the bone is healed then we'll consider injections.  Spent  25 minutes with patient face-to-face and had greater than 50% of counseling including as described above in assessment and plan.

## 2015-11-06 ENCOUNTER — Other Ambulatory Visit: Payer: Self-pay | Admitting: Internal Medicine

## 2015-11-07 DIAGNOSIS — R3915 Urgency of urination: Secondary | ICD-10-CM | POA: Diagnosis not present

## 2015-11-07 DIAGNOSIS — R351 Nocturia: Secondary | ICD-10-CM | POA: Diagnosis not present

## 2015-11-10 ENCOUNTER — Encounter: Payer: Self-pay | Admitting: Family Medicine

## 2015-11-10 ENCOUNTER — Ambulatory Visit (INDEPENDENT_AMBULATORY_CARE_PROVIDER_SITE_OTHER): Payer: Medicare Other | Admitting: Family Medicine

## 2015-11-10 ENCOUNTER — Other Ambulatory Visit: Payer: Self-pay

## 2015-11-10 VITALS — BP 128/72 | HR 79 | Ht 70.0 in | Wt 150.0 lb

## 2015-11-10 DIAGNOSIS — S76391D Other specified injury of muscle, fascia and tendon of the posterior muscle group at thigh level, right thigh, subsequent encounter: Secondary | ICD-10-CM

## 2015-11-10 DIAGNOSIS — M19019 Primary osteoarthritis, unspecified shoulder: Secondary | ICD-10-CM | POA: Insufficient documentation

## 2015-11-10 DIAGNOSIS — M7071 Other bursitis of hip, right hip: Secondary | ICD-10-CM | POA: Diagnosis not present

## 2015-11-10 DIAGNOSIS — M25551 Pain in right hip: Secondary | ICD-10-CM

## 2015-11-10 DIAGNOSIS — I2510 Atherosclerotic heart disease of native coronary artery without angina pectoris: Secondary | ICD-10-CM

## 2015-11-10 DIAGNOSIS — M12812 Other specific arthropathies, not elsewhere classified, left shoulder: Secondary | ICD-10-CM | POA: Diagnosis not present

## 2015-11-10 NOTE — Assessment & Plan Note (Signed)
Patient given injection today. Hopefully this will aid in healing. Patient did have some mild increase in bursitis that I think is contributing to more of his pain. Discussed with patient again if this is not seem to improve it we also need to rule out any type of lumbar radiculopathy that could be contribute in. Patient has had difficulty with his hamstrings bilaterally. Encourage him to continue to be active. We discussed icing regimen. Discuss continuing compression. Patient continue with home exercises. Decline formal physical therapy at this time.

## 2015-11-10 NOTE — Assessment & Plan Note (Signed)
Patient given injection and tolerated the procedure well. We discussed icing regimen. Discussed which activities to avoid. We'll try topical anti-inflammatories in the area. Discussed Hopper lifting mechanics. Follow-up again in 4 weeks.

## 2015-11-10 NOTE — Progress Notes (Signed)
Pre visit review using our clinic review tool, if applicable. No additional management support is needed unless otherwise documented below in the visit note. 

## 2015-11-10 NOTE — Progress Notes (Signed)
CC: Right buttocks pain follow-up  HPI: Patient was seen prior to his trip to [place] and was found to have an avulsion fracture of the right hamstring. This is proximally. Patient states that overall he has made some mild improvement. States that it feels much more similar to the contralateral side now. States that the severe pain at the weakness he was having previously is improved. Denies any numbness. Denies any weakness. States that it is severe when sitting on the area as well as can give him trouble when going up and down hills. No pain on flat surfaces.  Patient is also complaining of a new problem. Left shoulder pain. Has noticed over the course of time he is unable to reach across his body. Things such as driving in range of motion has decreased as well. Rates the severity of pain a 6 out of 10.  Past Medical History  Diagnosis Date  . History of BPH   . IBS (irritable bowel syndrome)   . HTN (hypertension)   . Anxiety   . Rhinitis   . Hyperlipidemia   . CAD (coronary artery disease)     a. s/p stent to LAD 2008;  b. abnormal ETT 11/13 with VTach => LHC pLAD 30% ISR, mCFX 30%, dRCA 60-75% which had progressed from previous study in 2010 => FFR of RCA 0.92 => med Rx   . Colon polyps   . Diverticular disease of colon   . Hemorrhoids     external and internal  . Hx of echocardiogram     a. Echo 9/11:  EF 55-60%, normal motion, grade 2 diastolic dysfunction, mild MR, mild LAE, mild RAE, PASP 33.  . Carotid stenosis     dopplers 1/14:  0-39% bilateral ICA stenosis  . Stroke, lacunar ([place]) 08/24/2012    Old, 2011 head mri  . Colon polyps     adenomatous   Past Surgical History  Procedure Laterality Date  . Appendectomy  1956  . Nasal sinus surgery  1975  . Hernia repair Right 2005  . Coronary stent placement  2008     Successful PCI of the lesion in the proximal LAD using a Promus   . Percutaneous coronary stent intervention (pci-s) N/A 05/03/2012    Procedure:  PERCUTANEOUS CORONARY STENT INTERVENTION (PCI-S);  Surgeon: Sherren Mocha, MD;  Location: [facility] CATH LAB;  Service: Cardiovascular;  Laterality: N/A;  . Promus des ok for [employer]  2008   Social History  Substance Use Topics  . Smoking status: Never Smoker   . Smokeless tobacco: Never Used  . Alcohol Use: 4.2 oz/week    7 Glasses of wine per week     Comment: 1-2 drinks daily   No Known Allergies no known drug allergies Family History  Problem Relation Age of Onset  . Coronary artery disease Father   . Heart attack Father   . Rheum arthritis Mother   . Colon cancer Neg Hx    .  Past medical, surgical, family and social history reviewed. Medications reviewed all in the electronic medical record.   Review of Systems: No headache, visual changes, nausea, vomiting, diarrhea, constipation, dizziness, abdominal pain, skin rash, fevers, chills, night sweats, weight loss, swollen lymph nodes, body aches, joint swelling, muscle aches, chest pain, shortness of breath, mood changes.   Objective:    Blood pressure 128/72, pulse 79, height 5\' 10"  (1.778 m), weight 150 lb (68.04 kg), SpO2 96 %.   General: No apparent distress alert and  oriented x3 mood and affect normal, dressed appropriately.  HEENT: Pupils equal, extraocular movements intact Respiratory: Patient's speak in full sentences and does not appear short of breath Cardiovascular: No lower extremity edema, non tender, no erythema Skin: Warm dry intact with no signs of infection or rash on extremities or on axial skeleton. Abdomen: Soft nontender Neuro: Cranial nerves II through XII are intact, neurovascularly intact in all extremities with 2+ DTRs and 2+ pulses. Lymph: No lymphadenopathy of posterior or anterior cervical chain or axillae bilaterally.  Gait normal with good balance and coordination.  MSK: Non tender with full range of motion and good stability and symmetric strength and tone of , elbows, wrist, hip, knee and ankles  bilaterally.  Shoulder: Left Arthritic changes over the acromial clavicular joint of the left side Pain over the acromial radicular side ROM is full in all planes. Rotator cuff strength normal throughout. Mild impingement signs but positive crossover Speeds and Yergason's tests normal. No labral pathology noted with negative Obrien's, negative clunk and good stability. Normal scapular function observed. No painful arc and no drop arm sign. No apprehension sign Contralateral shoulder unremarkable    Hip: Right ROM IR: 35 Deg, ER: 45 Deg, Flexion: 120 Deg, Extension: 100 Deg, Abduction: 45 Deg, Adduction: 45 Deg Strength IR: 5/5, ER: 5/5, Flexion: 5/5, Extension: 5/5, Abduction: 5/5, Adduction: 5/5 Pelvic alignment unremarkable to inspection and palpation. Standing hip rotation and gait without trendelenburg sign / unsteadiness. Greater trochanter without tenderness to palpation. Mildly positive Corky Sox been negative Fadir Patient is tender to palpation over ischial tuberosity at the insertion of the hamstring.  Continued tightness in the hamstrings bilaterally. Mild weakness on the right side compared to the contralateral side    Procedure: Real-time Ultrasound Guided Injection of left ischial tuberosity bursitis Device: GE Logiq E  Ultrasound guided injection is preferred based studies that show increased duration, increased effect, greater accuracy, decreased procedural pain, increased response rate, and decreased cost with ultrasound guided versus blind injection.  Verbal informed consent obtained.  Time-out conducted.  Noted no overlying erythema, induration, or other signs of local infection.  Skin prepped in a sterile fashion.  Local anesthesia: Topical Ethyl chloride.  With sterile technique and under real time ultrasound guidance: Injected 4 cc of 0.5% Marcaine and 1 cc of Kenalog 40 mg/dL into the ischial bursa. Pictures taken. Completed without difficulty  Pain  immediately resolved suggesting accurate placement of the medication.  Advised to call if fevers/chills, erythema, induration, drainage, or persistent bleeding.  Images permanently stored and available for review in the ultrasound unit.  Impression: Technically successful ultrasound guided injection.  Procedure: Real-time Ultrasound Guided Injection of left acromioclavicular joint Device: GE Logiq E  Ultrasound guided injection is preferred based studies that show increased duration, increased effect, greater accuracy, decreased procedural pain, increased response rate, and decreased cost with ultrasound guided versus blind injection.  Verbal informed consent obtained.  Time-out conducted.  Noted no overlying erythema, induration, or other signs of local infection.  Skin prepped in a sterile fashion.  Local anesthesia: Topical Ethyl chloride.  With sterile technique and under real time ultrasound guidance:  With a 25-gauge half-inch patient was injected with 0.5 mL of 0.5% Marcaine and 0.5 mL of Kenalog 40 mg/dL. Completed without difficulty  Pain immediately resolved suggesting accurate placement of the medication.  Advised to call if fevers/chills, erythema, induration, drainage, or persistent bleeding.  Images permanently stored and available for review in the ultrasound unit.  Impression: Technically successful ultrasound guided  injection.   Impression and Recommendations:     This case required medical decision making of moderate complexity.

## 2015-11-10 NOTE — Patient Instructions (Signed)
Good to see you  Made you a pin cushion today  ICe is your friend, especially in 6 hours.  No explosive activities at all for next week  AFter that no real restrictions.  See me again in 4 weeks to make sure you are making progress.

## 2015-12-01 ENCOUNTER — Ambulatory Visit (INDEPENDENT_AMBULATORY_CARE_PROVIDER_SITE_OTHER): Payer: Medicare Other | Admitting: Family Medicine

## 2015-12-01 ENCOUNTER — Other Ambulatory Visit: Payer: Self-pay

## 2015-12-01 ENCOUNTER — Encounter: Payer: Self-pay | Admitting: Family Medicine

## 2015-12-01 VITALS — BP 126/74 | HR 74 | Wt 148.0 lb

## 2015-12-01 DIAGNOSIS — S76311A Strain of muscle, fascia and tendon of the posterior muscle group at thigh level, right thigh, initial encounter: Secondary | ICD-10-CM | POA: Insufficient documentation

## 2015-12-01 DIAGNOSIS — I2510 Atherosclerotic heart disease of native coronary artery without angina pectoris: Secondary | ICD-10-CM

## 2015-12-01 DIAGNOSIS — S76391D Other specified injury of muscle, fascia and tendon of the posterior muscle group at thigh level, right thigh, subsequent encounter: Secondary | ICD-10-CM

## 2015-12-01 MED ORDER — VITAMIN D (ERGOCALCIFEROL) 1.25 MG (50000 UNIT) PO CAPS: 50000.0000 [IU] | ORAL_CAPSULE | ORAL | Status: DC

## 2015-12-01 NOTE — Assessment & Plan Note (Signed)
Patient did not have a significant improvement at this time. We'll start with once weekly vitamin D and held with the avulsion. Patient does not show any weakness or any defect in the muscle itself. I believe the patient is having more of a piriformis syndrome and will be treated appropriately. Given new exercises, we discussed icing regimen. Discussed proper shoes and the importance of hip abductors. Given injection in the tendon sheath to hopefully will be beneficial as well. Follow-up again in 2-3 weeks.

## 2015-12-01 NOTE — Progress Notes (Signed)
CC: Right buttocks pain follow-up  HPI: Patient was seen prior to his trip to [place] and was found to have an avulsion fracture of the right hamstring. Patient was continued have pain. Was given an injection. Patient states that he seemed to me and proximal wing pain-free informed 3 days and then for some the pain started coming back. Patient states that he starts walking any faster he starts to have the discomfort. Seems little higher the even hurting him on plantar surfaces. Patient states it seems to radiate radiating a little more lateral than usual. Denies any numbness. States that when he sits on certain things for too long he could also have discomfort.  Left shoulder is completely resolved.  Past Medical History  Diagnosis Date  . History of BPH   . IBS (irritable bowel syndrome)   . HTN (hypertension)   . Anxiety   . Rhinitis   . Hyperlipidemia   . CAD (coronary artery disease)     a. s/p stent to LAD 2008;  b. abnormal ETT 11/13 with VTach => LHC pLAD 30% ISR, mCFX 30%, dRCA 60-75% which had progressed from previous study in 2010 => FFR of RCA 0.92 => med Rx   . Colon polyps   . Diverticular disease of colon   . Hemorrhoids     external and internal  . Hx of echocardiogram     a. Echo 9/11:  EF 55-60%, normal motion, grade 2 diastolic dysfunction, mild MR, mild LAE, mild RAE, PASP 33.  . Carotid stenosis     dopplers 1/14:  0-39% bilateral ICA stenosis  . Stroke, lacunar ([place]) 08/24/2012    Old, 2011 head mri  . Colon polyps     adenomatous   Past Surgical History  Procedure Laterality Date  . Appendectomy  1956  . Nasal sinus surgery  1975  . Hernia repair Right 2005  . Coronary stent placement  2008     Successful PCI of the lesion in the proximal LAD using a Promus   . Percutaneous coronary stent intervention (pci-s) N/A 05/03/2012    Procedure: PERCUTANEOUS CORONARY STENT INTERVENTION (PCI-S);  Surgeon: Sherren Mocha, MD;  Location: [facility] CATH LAB;  Service:  Cardiovascular;  Laterality: N/A;  . Promus des ok for [employer]  2008   Social History  Substance Use Topics  . Smoking status: Never Smoker   . Smokeless tobacco: Never Used  . Alcohol Use: 4.2 oz/week    7 Glasses of wine per week     Comment: 1-2 drinks daily   No Known Allergies no known drug allergies Family History  Problem Relation Age of Onset  . Coronary artery disease Father   . Heart attack Father   . Rheum arthritis Mother   . Colon cancer Neg Hx    .  Past medical, surgical, family and social history reviewed. Medications reviewed all in the electronic medical record.   Review of Systems: No headache, visual changes, nausea, vomiting, diarrhea, constipation, dizziness, abdominal pain, skin rash, fevers, chills, night sweats, weight loss, swollen lymph nodes, body aches, joint swelling, muscle aches, chest pain, shortness of breath, mood changes.   Objective:    Blood pressure 126/74, pulse 74, weight 148 lb (67.132 kg).   General: No apparent distress alert and oriented x3 mood and affect normal, dressed appropriately.  HEENT: Pupils equal, extraocular movements intact Respiratory: Patient's speak in full sentences and does not appear short of breath Cardiovascular: No lower extremity edema, non tender, no erythema  Skin: Warm dry intact with no signs of infection or rash on extremities or on axial skeleton. Abdomen: Soft nontender Neuro: Cranial nerves II through XII are intact, neurovascularly intact in all extremities with 2+ DTRs and 2+ pulses. Lymph: No lymphadenopathy of posterior or anterior cervical chain or axillae bilaterally.  Gait normal with good balance and coordination.  MSK: Non tender with full range of motion and good stability and symmetric strength and tone of , elbows, wrist, hip, knee and ankles bilaterally.  Shoulder: Left Arthritic changes over the acromial clavicular joint of the left side Nontender ROM is full in all planes. Rotator cuff  strength normal throughout. Mild impingement signs but positive crossover but improved Speeds and Yergason's tests normal. No labral pathology noted with negative Obrien's, negative clunk and good stability. Normal scapular function observed. No painful arc and no drop arm sign. No apprehension sign Contralateral shoulder unremarkable    Hip: Right ROM IR: 35 Deg, ER: 45 Deg, Flexion: 120 Deg, Extension: 100 Deg, Abduction: 45 Deg, Adduction: 45 Deg Strength IR: 5/5, ER: 5/5, Flexion: 5/5, Extension: 5/5, Abduction: 5/5, Adduction: 5/5 Pelvic alignment unremarkable to inspection and palpation. Standing hip rotation and gait without trendelenburg sign / unsteadiness. Greater trochanter without tenderness to palpation. Worsening Faber test Pain is now more over the piriformis muscle Patient had some mild increase in strength of the hamstrings bilaterally. Patient is having no more pain over the piriformis muscle.   Procedure: Real-time Ultrasound Guided Injection of right piriformis Device: GE Logiq E  Ultrasound guided injection is preferred based studies that show increased duration, increased effect, greater accuracy, decreased procedural pain, increased response rate, and decreased cost with ultrasound guided versus blind injection.  Verbal informed consent obtained.  Time-out conducted.  Noted no overlying erythema, induration, or other signs of local infection.  Skin prepped in a sterile fashion.  Local anesthesia: Topical Ethyl chloride.  With sterile technique and under real time ultrasound guidance:  With a 21-gauge 2 and she'll patient was injected with a total of 0.5 mL of 0.5% Marcaine and 1 mL of Kenalog 40 mg/dL in the tendon sheath and tolerated the procedure well. Completed without difficulty  Pain immediately resolved suggesting accurate placement of the medication.  Advised to call if fevers/chills, erythema, induration, drainage, or persistent bleeding.  Images  permanently stored and available for review in the ultrasound unit.  Impression: Technically successful ultrasound guided injection.   Impression and Recommendations:     This case required medical decision making of moderate complexity.

## 2015-12-01 NOTE — Assessment & Plan Note (Signed)
Patient given injection today and tolerated the procedure well. We discussed icing regimen. Home exercises. Discussed which activities to do in which ones to avoid.  Piriformis Syndrome  Using an anatomical model, reviewed with the patient the structures involved and how they related to diagnosis. The patient indicated understanding.   The patient was given a handout from Dr. Arne Cleveland book "The Sports Medicine Patient Advisor" describing the anatomy and rehabilitation of the following condition: Piriformis Syndrome  Also given a handout with more extensive Piriformis stretching, hip flexor and abductor strengthening, ham stretching  Rec deep massage, explained self-massage with ball Return to clinic in 2-3 weeks. Worsening symptoms we'll need to consider possible back x-rays as well as further imaging is necessary as well as formal physical therapy.

## 2015-12-01 NOTE — Patient Instructions (Signed)
Good to see you  Once weekly vitamin D to help the rest of the bone heal.  Piriformis syndrome.  New exercises 3 times a week.  Ice 20 minutes 2 times daily. Usually after activity and before bed. Tennis ball in back right pocket with sitting.  Good shoes with rigid bottom.  [name] [name], [place] or New balance greater then 700 Try to take it  alittle easy the rest of this week and then start your walking again.  This should help but we may need physical therapy  See me again in 3 weeks.

## 2015-12-08 ENCOUNTER — Ambulatory Visit: Payer: Medicare Other | Admitting: Family Medicine

## 2016-01-06 ENCOUNTER — Other Ambulatory Visit: Payer: Self-pay | Admitting: Internal Medicine

## 2016-01-16 ENCOUNTER — Other Ambulatory Visit: Payer: Self-pay | Admitting: Cardiovascular Disease

## 2016-01-24 ENCOUNTER — Other Ambulatory Visit: Payer: Self-pay | Admitting: Cardiovascular Disease

## 2016-01-26 DIAGNOSIS — H01001 Unspecified blepharitis right upper eyelid: Secondary | ICD-10-CM | POA: Diagnosis not present

## 2016-01-26 DIAGNOSIS — H01004 Unspecified blepharitis left upper eyelid: Secondary | ICD-10-CM | POA: Diagnosis not present

## 2016-01-26 DIAGNOSIS — H00015 Hordeolum externum left lower eyelid: Secondary | ICD-10-CM | POA: Diagnosis not present

## 2016-01-26 DIAGNOSIS — H00012 Hordeolum externum right lower eyelid: Secondary | ICD-10-CM | POA: Diagnosis not present

## 2016-02-02 NOTE — Progress Notes (Signed)
CC: Right buttocks pain follow-up left shoulder pain follow-up  HPI: Patient was seen prior to his trip to [place] and was found to have an avulsion fracture of the right hamstring. Patient recently was given an injection the piriformis muscle. This was greater than 3 months ago. Patient states still having pain more at the hamstring insertion. Patient has been doing the once weekly vitamin D. Patient states that using the cushioning seems to be better. States that he can walk without any significant pain but unfortunate sitting seems to be worsening.  Left shoulder is doing better overall. Still has trouble with reaching across the chest. Patient states that [facility] night and continue to wake him up as well. Past Medical History:  Diagnosis Date  . Anxiety   . CAD (coronary artery disease)    a. s/p stent to LAD 2008;  b. abnormal ETT 11/13 with VTach => LHC pLAD 30% ISR, mCFX 30%, dRCA 60-75% which had progressed from previous study in 2010 => FFR of RCA 0.92 => med Rx   . Carotid stenosis    dopplers 1/14:  0-39% bilateral ICA stenosis  . Colon polyps   . Colon polyps    adenomatous  . Diverticular disease of colon   . Hemorrhoids    external and internal  . History of BPH   . HTN (hypertension)   . Hx of echocardiogram    a. Echo 9/11:  EF 55-60%, normal motion, grade 2 diastolic dysfunction, mild MR, mild LAE, mild RAE, PASP 33.  Marland Kitchen Hyperlipidemia   . IBS (irritable bowel syndrome)   . Rhinitis   . Stroke, lacunar ([place]) 08/24/2012   Old, 2011 head mri   Past Surgical History:  Procedure Laterality Date  . APPENDECTOMY  1956  . CORONARY STENT PLACEMENT  2008    Successful PCI of the lesion in the proximal LAD using a Promus   . HERNIA REPAIR Right 2005  . NASAL SINUS SURGERY  1975  . PERCUTANEOUS CORONARY STENT INTERVENTION (PCI-S) N/A 05/03/2012   Procedure: PERCUTANEOUS CORONARY STENT INTERVENTION (PCI-S);  Surgeon: Sherren Mocha, MD;  Location: [facility] CATH LAB;  Service:  Cardiovascular;  Laterality: N/A;  . Promus DES ok for 3T MRI  2008   Social History  Substance Use Topics  . Smoking status: Never Smoker  . Smokeless tobacco: Never Used  . Alcohol use 4.2 oz/week    7 Glasses of wine per week     Comment: 1-2 drinks daily   No Known Allergies no known drug allergies Family History  Problem Relation Age of Onset  . Coronary artery disease Father   . Heart attack Father   . Rheum arthritis Mother   . Colon cancer Neg Hx    .  Past medical, surgical, family and social history reviewed. Medications reviewed all in the electronic medical record.   Review of Systems: No headache, visual changes, nausea, vomiting, diarrhea, constipation, dizziness, abdominal pain, skin rash, fevers, chills, night sweats, weight loss, swollen lymph nodes, body aches, joint swelling, muscle aches, chest pain, shortness of breath, mood changes.   Objective:    Blood pressure 130/72, pulse 80, weight 150 lb (68 kg).   General: No apparent distress alert and oriented x3 mood and affect normal, dressed appropriately.  HEENT: Pupils equal, extraocular movements intact Respiratory: Patient's speak in full sentences and does not appear short of breath Cardiovascular: No lower extremity edema, non tender, no erythema Skin: Warm dry intact with no signs of infection or rash  on extremities or on axial skeleton. Abdomen: Soft nontender Neuro: Cranial nerves II through XII are intact, neurovascularly intact in all extremities with 2+ DTRs and 2+ pulses. Lymph: No lymphadenopathy of posterior or anterior cervical chain or axillae bilaterally.  Gait normal with good balance and coordination.  MSK: Non tender with full range of motion and good stability and symmetric strength and tone of , elbows, wrist, hip, knee and ankles bilaterally.  Shoulder: Left Arthritic changes over the acromial clavicular joint of the left side More tender than previous exam. ROM is full in all  planes. Rotator cuff strength normal throughout. Mild impingement signs but positive crossover but improved Speeds and Yergason's tests normal. No labral pathology noted with negative Obrien's, negative clunk and good stability. Positive crossover Normal scapular function observed. No painful arc and no drop arm sign. No apprehension sign Contralateral shoulder unremarkable    Hip: Right ROM IR: 35 Deg, ER: 45 Deg, Flexion: 120 Deg, Extension: 100 Deg, Abduction: 45 Deg, Adduction: 45 Deg Strength IR: 5/5, ER: 5/5, Flexion: 5/5, Extension: 5/5, Abduction: 5/5, Adduction: 5/5 Pelvic alignment unremarkable to inspection and palpation. Standing hip rotation and gait without trendelenburg sign / unsteadiness. Greater trochanter without tenderness to palpation. Worsening Faber test Pain is now more over the piriformis muscle Continued mild tightness of the hamstring bilaterally but less pain and previous exam.  Patient is having no more pain over the piriformis muscle.  Procedure: Real-time Ultrasound Guided Injection of left acromial clavicular joint Device: GE Logiq E  Ultrasound guided injection is preferred based studies that show increased duration, increased effect, greater accuracy, decreased procedural pain, increased response rate, and decreased cost with ultrasound guided versus blind injection.  Verbal informed consent obtained.  Time-out conducted.  Noted no overlying erythema, induration, or other signs of local infection.  Skin prepped in a sterile fashion.  Local anesthesia: Topical Ethyl chloride.  With sterile technique and under real time ultrasound guidance:  With a 25-gauge half-inch needle patient was injected with a total of 0.5 mL of 0.5% Marcaine and 0.5 mL of Kenalog 40 mg/dL. Completed without difficulty  Pain immediately resolved suggesting accurate placement of the medication.  Advised to call if fevers/chills, erythema, induration, drainage, or persistent  bleeding.  Images permanently stored and available for review in the ultrasound unit.  Impression: Technically successful ultrasound guided injection.   Impression and Recommendations:     This case required medical decision making of moderate complexity.

## 2016-02-03 ENCOUNTER — Other Ambulatory Visit: Payer: Self-pay

## 2016-02-03 ENCOUNTER — Encounter: Payer: Self-pay | Admitting: Family Medicine

## 2016-02-03 ENCOUNTER — Ambulatory Visit (INDEPENDENT_AMBULATORY_CARE_PROVIDER_SITE_OTHER): Payer: Medicare Other | Admitting: Family Medicine

## 2016-02-03 VITALS — BP 130/72 | HR 80 | Wt 150.0 lb

## 2016-02-03 DIAGNOSIS — M129 Arthropathy, unspecified: Secondary | ICD-10-CM | POA: Diagnosis not present

## 2016-02-03 DIAGNOSIS — M25512 Pain in left shoulder: Secondary | ICD-10-CM

## 2016-02-03 DIAGNOSIS — M19019 Primary osteoarthritis, unspecified shoulder: Secondary | ICD-10-CM

## 2016-02-03 DIAGNOSIS — S76391D Other specified injury of muscle, fascia and tendon of the posterior muscle group at thigh level, right thigh, subsequent encounter: Secondary | ICD-10-CM

## 2016-02-03 DIAGNOSIS — I2510 Atherosclerotic heart disease of native coronary artery without angina pectoris: Secondary | ICD-10-CM

## 2016-02-03 MED ORDER — VITAMIN D (ERGOCALCIFEROL) 1.25 MG (50000 UNIT) PO CAPS: 50000.0000 [IU] | ORAL_CAPSULE | ORAL | 0 refills | Status: DC

## 2016-02-03 NOTE — Assessment & Plan Note (Signed)
Tourniquet injection today and tolerated the procedure well. We discussed icing regimen and home exercises. We discussed which activities to do a which was potentially avoid. Patient will continue to be active. Discussed we can repeat every 3-4 months if needed.

## 2016-02-03 NOTE — Patient Instructions (Addendum)
Good to see you as always.  Ice is your friend  We injected the [facility] joint today I hope it helps Lets continue to watch the pain in the back side If worsening call me but I think you are doing great overall.  A donut would be better then a pad to be honest.  Once weekly vitamin D for another 12 weeks to help healing.  See me when you need me.

## 2016-02-03 NOTE — Assessment & Plan Note (Signed)
Patient seems to be doing relatively well. Was given an injection at last follow-up. Patient is 3 months out at this time. We discussed we can repeat if necessary but patient was to continue with conservative therapy. Refilled once weekly vitamin D. Return as needed.

## 2016-02-04 ENCOUNTER — Encounter: Payer: Self-pay | Admitting: Cardiovascular Disease

## 2016-02-12 NOTE — Progress Notes (Signed)
Patient ID: [name] [name], male   DOB: 06-02-1932, 82 y.o.   MRN: [id number]   He has a hx of CAD, s/p stent to the LAD in 2008, HTN, HL, IBS, anxiety. Echo 9/11: EF 55-60%, normal motion, grade 2 diastolic dysfunction, mild MR, mild LAE, mild RAE, PASP 33. He saw me  in 03/2012 and noted throat pain intermittently. He was set up for an ETT. ETT 04/27/12: Abnormal with episodes of ventricular tachycardia-longest 18 beats. He was set up for cardiac catheterization. LHC 05/01/12: pLAD 30% ISR, mCFX 30%, dRCA 60-75% which had progressed from previous study in 2010. Patient was loaded with Plavix and brought back 05/03/12  for pressure wire analysis. FFR at peak hyperemia was 0.92 which was felt to be hemodynamically insignificant. Continued medical therapy was recommended. He was placed on Toprol and his Plavix was stopped.   Doing well since being on Toprol mild exertional dyspnea and occasional chest pressure when walking at beach    Continues to have episodes of transient visual loss.  ? Occular migraines Been going on for over 3-4 years  Recently daughter who is a PA indicated pulse was irregular With episode.  Lasted 3-5 minutes.  Denies associated headache , speech difficulty, or other focal neuro signs Has seen [facility] before  MRI reviewed 11/2013 and  Mild small vessel disease  F/U MRI/MRA reviewed and no abnormalities.  Seen by Dr Rayann Heman for ILR He wanted patient seen by neurology first  Seen by Dr Tomi Likens 2/23. F/U EEG was normal with no epileptic foci  Was kind enough to bring me a bottle of Jerome today Daughter was in a life threatening car accident in [place] and has finally recovered after 3 weeks   01/11/14  1-39% bilateral carotid disease  04/23/14 Myovue Reviewed  Overall Impression: Low risk stress nuclear study with mild basal septal as well as basal inferolateral defect seen at both rest and stress with associated minimal ischemia.   LV Ejection Fraction: 54%. 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  All other systems reviewed and negative  General: Affect appropriate Healthy:  appears stated age 82: normal Neck supple with no adenopathy JVP normal no bruits no thyromegaly Lungs clear with no wheezing and good diaphragmatic motion Heart:  S1/S2 no murmur, no rub, gallop or click PMI normal Abdomen: benighn, BS positve, no tenderness, no AAA no bruit.  No HSM or HJR Distal pulses intact with no bruits No edema Neuro non-focal Skin warm and dry No muscular weakness   Current Outpatient Prescriptions  Medication Sig Dispense Refill  . ALPRAZolam (XANAX) 0.25 MG tablet take 1/2 to 1 tablet by mouth at bedtime if needed for sleep---patient takes as needed 90 tablet 1  . aspirin 81 MG tablet Take 81 mg by mouth daily.      . Bisacodyl (DULCOLAX PO) Take 1 tablet by mouth daily as needed (for consitpation).     . clobetasol (TEMOVATE) 0.05 % external solution APPLY ON THE SCALP NIGHTLY AS DIRECTED  0  .  cyclobenzaprine (FLEXERIL) 10 MG tablet Take 1 tablet (10 mg total) by mouth 3 (three) times daily as needed for muscle spasms. 30 tablet 0  . diazepam (VALIUM) 5 MG tablet Take 5 mg by mouth every 6 (six) hours as needed for anxiety.     . dutasteride (AVODART) 0.5 MG capsule Take 0.5 mg by mouth daily.    . fluticasone (FLONASE) 50 MCG/ACT nasal spray USE 2 SPRAYS IN EACH NOSTRIL DAILY 32 g 11  . hydrocortisone 2.5 % cream apply to FACE twice a day  as directed  0  . hydrocortisone cream 0.5 % Apply 1 application topically 2 (two) times daily. Patient takes as needed    . ipratropium (ATROVENT) 0.03 % nasal spray USE 2 SPRAYS INTO BOTH NOSTRILS EVERY 12 HOURS 90 mL 2  . lisinopril (PRINIVIL,ZESTRIL) 10 MG tablet Take 1 tablet (10 mg total) by mouth daily. 30 tablet 11  . meloxicam (MOBIC) 15 MG tablet Take 1 tablet (15 mg total) by mouth daily. 30 tablet 0  . metoprolol succinate (TOPROL XL) 25 MG 24 hr tablet Take 1 tablet (25 mg total) by mouth daily. 30 tablet 11  . nitroGLYCERIN (NITROSTAT) 0.4 MG SL tablet Place 0.4 mg under the tongue every 5 (five) minutes as needed for chest pain (up to 3 doses only).    . pantoprazole (PROTONIX) 40 MG tablet Take 1 tablet (40 mg total) by mouth daily. 90 tablet 3  . Psyllium (MEDI-MUCIL PO) Take 240 g by mouth daily. Every morning     . rosuvastatin (CRESTOR) 40 MG tablet Take 1 tablet (40 mg total) by mouth daily. 30 tablet 11  . traMADol (ULTRAM) 50 MG tablet Take 50 mg by mouth every 12 (twelve) hours as needed (pain).    . triamcinolone cream (KENALOG) 0.1 % APPLY ON THE SKIN TWICE A DAY APPLY TO BACK AND LOWER LEGS TWICE A DAY FOR 2 TO 4 WEEKS AS DIRECTED  0  . Vitamin D, Ergocalciferol, (DRISDOL) 50000 units CAPS capsule Take 1 capsule (50,000 Units total) by mouth every 7 (seven) days. 12 capsule 0  . zolpidem (AMBIEN) 10 MG tablet Take 1 tablet (10 mg total) by mouth at bedtime as needed for sleep. [employer] [id number] 30 tablet 5   No current facility-administered medications for this visit.     Allergies  Review of patient's allergies indicates no known allergies.  Electrocardiogram:  04/18/14  SR rate 57  ICRBBB old IMI possible nonspecific ST T wave changes  no change from 2014  06/10/15  SR rate 92 PAC/PVC ICRBBB old IMI No afib.   02/18/16  SR ICRBBB old IMI   Assessment and Plan CAD: Stable with no angina and good activity level.  Continue medical Rx Stent LAD 2008  Moderate  distal RCA with normal flow wire 04/2012  Non ischemic myovue 04/24/14 Chol: Lab Results  Component Value Date   LDLCALC 55 07/09/2015    GERD:  Continue protonix   Insmonia:  Chronic on ambien f/u Dr Ronnald Ramp  Visual Disturbance:  MRI/MRA normal EEG normal has been seen by neurology No recurrence will wait until he has more  Symptoms to consider referral  for ILR    F/U with me in 6 months   [name]

## 2016-02-18 ENCOUNTER — Ambulatory Visit (INDEPENDENT_AMBULATORY_CARE_PROVIDER_SITE_OTHER): Payer: Medicare Other | Admitting: Cardiovascular Disease

## 2016-02-18 ENCOUNTER — Encounter: Payer: Self-pay | Admitting: Cardiovascular Disease

## 2016-02-18 VITALS — BP 134/80 | HR 76 | Ht 70.0 in | Wt 147.4 lb

## 2016-02-18 DIAGNOSIS — I2510 Atherosclerotic heart disease of native coronary artery without angina pectoris: Secondary | ICD-10-CM

## 2016-02-18 DIAGNOSIS — I472 Ventricular tachycardia: Secondary | ICD-10-CM | POA: Diagnosis not present

## 2016-02-18 DIAGNOSIS — I4729 Other ventricular tachycardia: Secondary | ICD-10-CM

## 2016-02-18 MED ORDER — METOPROLOL SUCCINATE ER 25 MG PO TB24: 25.0000 mg | ORAL_TABLET | Freq: Every day | ORAL | 11 refills | Status: DC

## 2016-02-18 MED ORDER — ROSUVASTATIN CALCIUM 40 MG PO TABS: 40.0000 mg | ORAL_TABLET | Freq: Every day | ORAL | 11 refills | Status: DC

## 2016-02-18 MED ORDER — LISINOPRIL 10 MG PO TABS: 10.0000 mg | ORAL_TABLET | Freq: Every day | ORAL | 11 refills | Status: DC

## 2016-02-18 NOTE — Patient Instructions (Signed)

## 2016-02-19 ENCOUNTER — Other Ambulatory Visit: Payer: Self-pay | Admitting: Cardiovascular Disease

## 2016-02-19 MED ORDER — LISINOPRIL 10 MG PO TABS: 10.0000 mg | ORAL_TABLET | Freq: Every day | ORAL | 3 refills | Status: DC

## 2016-02-20 ENCOUNTER — Other Ambulatory Visit: Payer: Self-pay | Admitting: Cardiovascular Disease

## 2016-02-20 MED ORDER — METOPROLOL SUCCINATE ER 25 MG PO TB24: 25.0000 mg | ORAL_TABLET | Freq: Every day | ORAL | 3 refills | Status: DC

## 2016-02-25 DIAGNOSIS — H25813 Combined forms of age-related cataract, bilateral: Secondary | ICD-10-CM | POA: Diagnosis not present

## 2016-02-25 DIAGNOSIS — H5213 Myopia, bilateral: Secondary | ICD-10-CM | POA: Diagnosis not present

## 2016-02-25 DIAGNOSIS — H52223 Regular astigmatism, bilateral: Secondary | ICD-10-CM | POA: Diagnosis not present

## 2016-02-26 DIAGNOSIS — D045 Carcinoma in situ of skin of trunk: Secondary | ICD-10-CM | POA: Diagnosis not present

## 2016-02-26 DIAGNOSIS — L814 Other melanin hyperpigmentation: Secondary | ICD-10-CM | POA: Diagnosis not present

## 2016-02-26 DIAGNOSIS — C4441 Basal cell carcinoma of skin of scalp and neck: Secondary | ICD-10-CM | POA: Diagnosis not present

## 2016-02-26 DIAGNOSIS — D485 Neoplasm of uncertain behavior of skin: Secondary | ICD-10-CM | POA: Diagnosis not present

## 2016-02-26 DIAGNOSIS — L821 Other seborrheic keratosis: Secondary | ICD-10-CM | POA: Diagnosis not present

## 2016-02-26 DIAGNOSIS — D1801 Hemangioma of skin and subcutaneous tissue: Secondary | ICD-10-CM | POA: Diagnosis not present

## 2016-02-26 DIAGNOSIS — L820 Inflamed seborrheic keratosis: Secondary | ICD-10-CM | POA: Diagnosis not present

## 2016-02-26 DIAGNOSIS — Z85828 Personal history of other malignant neoplasm of skin: Secondary | ICD-10-CM | POA: Diagnosis not present

## 2016-02-26 DIAGNOSIS — D692 Other nonthrombocytopenic purpura: Secondary | ICD-10-CM | POA: Diagnosis not present

## 2016-02-26 DIAGNOSIS — Z85820 Personal history of malignant melanoma of skin: Secondary | ICD-10-CM | POA: Diagnosis not present

## 2016-02-26 DIAGNOSIS — L570 Actinic keratosis: Secondary | ICD-10-CM | POA: Diagnosis not present

## 2016-02-27 ENCOUNTER — Other Ambulatory Visit: Payer: Self-pay | Admitting: Internal Medicine

## 2016-03-22 DIAGNOSIS — H01004 Unspecified blepharitis left upper eyelid: Secondary | ICD-10-CM | POA: Diagnosis not present

## 2016-03-22 DIAGNOSIS — H01001 Unspecified blepharitis right upper eyelid: Secondary | ICD-10-CM | POA: Diagnosis not present

## 2016-03-22 DIAGNOSIS — H0015 Chalazion left lower eyelid: Secondary | ICD-10-CM | POA: Diagnosis not present

## 2016-03-22 DIAGNOSIS — H0012 Chalazion right lower eyelid: Secondary | ICD-10-CM | POA: Diagnosis not present

## 2016-04-15 DIAGNOSIS — Z85820 Personal history of malignant melanoma of skin: Secondary | ICD-10-CM | POA: Diagnosis not present

## 2016-04-15 DIAGNOSIS — Z85828 Personal history of other malignant neoplasm of skin: Secondary | ICD-10-CM | POA: Diagnosis not present

## 2016-04-15 DIAGNOSIS — L570 Actinic keratosis: Secondary | ICD-10-CM | POA: Diagnosis not present

## 2016-04-20 ENCOUNTER — Telehealth: Payer: Self-pay

## 2016-04-20 NOTE — Telephone Encounter (Signed)
Pt needs repeat neuropscyh testing. VM left requesting pt return call.

## 2016-04-20 NOTE — Telephone Encounter (Signed)
Yes, have it done with [facility].

## 2016-04-20 NOTE — Telephone Encounter (Signed)
-----   Message from [name] [name], [place] sent at 07/17/2015  8:59 AM EST ----- [name]' neuropsych testing needs done

## 2016-04-20 NOTE — Telephone Encounter (Signed)
Okay to have repeat neuropsych testing done with Dr. Richrd Sox? Please advise. Was done by [facility] on

## 2016-04-28 ENCOUNTER — Other Ambulatory Visit: Payer: Self-pay | Admitting: Internal Medicine

## 2016-04-28 DIAGNOSIS — B309 Viral conjunctivitis, unspecified: Secondary | ICD-10-CM | POA: Diagnosis not present

## 2016-04-28 DIAGNOSIS — F40243 Fear of flying: Secondary | ICD-10-CM

## 2016-04-28 NOTE — Telephone Encounter (Signed)
Faxed to [facility].

## 2016-06-03 ENCOUNTER — Encounter: Payer: Medicare Other | Admitting: Psychology

## 2016-06-09 ENCOUNTER — Ambulatory Visit (INDEPENDENT_AMBULATORY_CARE_PROVIDER_SITE_OTHER): Payer: Medicare Other | Admitting: Psychology

## 2016-06-09 ENCOUNTER — Encounter: Payer: Self-pay | Admitting: Psychology

## 2016-06-09 DIAGNOSIS — H539 Unspecified visual disturbance: Secondary | ICD-10-CM | POA: Diagnosis not present

## 2016-06-09 DIAGNOSIS — R4189 Other symptoms and signs involving cognitive functions and awareness: Secondary | ICD-10-CM

## 2016-06-09 NOTE — Progress Notes (Signed)
NEUROPSYCHOLOGICAL INTERVIEW (CPT: [id number])  Name: [name] [name] Date of Birth: 30-Aug-1932 Date of Interview: 06/09/2016  Reason for Referral:  [name] [name] is a 83 y.o., right-handed male who is referred for neuropsychological evaluation by Dr. Metta Clines of [facility]. This patient is unaccompanied in the office today.   History of Presenting Problem:  [name] saw Dr. Tomi [name] almost a year ago, on 07/10/2015, for neurologic consultation due to recurrent transient visual disturbance. He had experienced episodes of central vision loss a few times in recent years. Most recent episode was over the Christmas holiday in 2016. Dr. Tomi [name] did not feel these events were TIAs due to the unlikelihood of recurrent TIAs with habitual symptoms. Ocular migraines were considered possible. Occipital seizures were considered possible but not probable. A cardiac source was recommended to be considered, given that he had experienced a drop in heart rate during a spell. He underwent EEG on 07/16/2015 which was normal. A brain MRI on 06/13/2015 revealed no acute intracranial finding, with only minimal small vessel change of the white matter. He also had normal intracranial MRA of the large and medium vessels.   The patient reports no recurrence of visual disturbance since the episode in December 2016.   In correspondence from [name] previous nurse, there is mention of previous neuropsychological evaluation with Dr. Valentina [name] and a need for repeat neuropsychological evaluation. However, I do not see any records from Dr. Valentina [name] in the patient's EHR, and the patient himself does not recall ever having testing done or seeing a Dr. Valentina [name] in the past. The patient has no complaints or concerns about cognitive functioning. Because he is still working full time doing cognitively complex tasks, we decided baseline testing would be helpful for him.   Upon direct questioning, the patient reported:   Forgetting recent  conversations/events: No Repeating statements/questions: No Misplacing/losing items: No Forgetting appointments or other obligations: He recently did show up to an appointment on the wrong day. This is the only time this has happened, and it was a rescheduled appointment due to recent snowstorm.  Forgetting to take medications: No  Difficulty concentrating: No Starting but not finishing tasks: No Distracted easily: No Processing information more slowly: No  Word-finding difficulty: No Comprehension difficulty: No  Getting lost when driving: No Making wrong turns when driving: No Uncertain about directions when driving or passenger: No   Psychiatric history was denied. He denied any history of suicidal ideation or intention. He does have family history of probable bipolar disorder in his mother, who attempted suicide multiple times. Both of his parents were alcoholics. He had a difficult home life as a child, but he and his brother were very close and clearly very resilient. There was no physical abuse in the home.  There is no known family history of dementia.   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 He and his son work together. His work involves cognitively complex tasks such as Restaurant manager, fast food, making decisions about [employer] deals, speaking with bankers, making decisions about tax deferments, etc. He denies any difficulty performing these tasks.  [name] lives with his wife in a private residence. They also have homes in [facility] [place] and [place]. He walks every day and tries to get 8000 steps in a day. He is independent in all instrumental and basic ADLs.  He denies any medical or physical complaints at the present time. He has no pain. He  has no difficulty with balance. He has not had any falls.   His mood is generally good. He denied any significant depression or anxiety. His brother, with whom he  was very close, did pass away recently.   He denied sleep difficulty. He does take 2/3 Ambien tablet at night. He has been taking Ambien for 10 years. He used to take the whole tablet but decided to reduce it to 2/3.   He denies change or problem with appetite.   Social History: Born/Raised: [place]. Moved around a lot. Attended 16 different schools, including 4 different high schools. He went into the [employer] immediately after high school. Was in 4 years. Then worked his way through college at [employer].  Education: 4 year college degree Occupational history: Married x56 years.  Three children (son-[place], daughter-[place], daughter-[place]). 2 grandchildren in [place]. Alcohol/Tobacco/Substances: He has reduced the amount of alcohol he consumes. However, he never was drinking heavily or had a drinking problem. He generally has 2 drinks 2 times a week. Never a smoker. No SA.   Medical History: Past Medical History:  Diagnosis Date  . Anxiety   . CAD (coronary artery disease)    a. s/p stent to LAD 2008;  b. abnormal ETT 11/13 with VTach => LHC pLAD 30% ISR, mCFX 30%, dRCA 60-75% which had progressed from previous study in 2010 => FFR of RCA 0.92 => med Rx   . Carotid stenosis    dopplers 1/14:  0-39% bilateral ICA stenosis  . Colon polyps   . Colon polyps    adenomatous  . Diverticular disease of colon   . Hemorrhoids    external and internal  . History of BPH   . HTN (hypertension)   . Hx of echocardiogram    a. Echo 9/11:  EF 55-60%, normal motion, grade 2 diastolic dysfunction, mild MR, mild LAE, mild RAE, PASP 33.  Marland Kitchen Hyperlipidemia   . IBS (irritable bowel syndrome)   . Rhinitis   . Stroke, lacunar ([place]) 08/24/2012   Old, 2011 head mri     Current Medications:  Outpatient Encounter Prescriptions as of 06/09/2016  Medication Sig  . ALPRAZolam (XANAX) 0.25 MG tablet take 1/2 to 1 tablet by mouth at bedtime if needed for sleep  . aspirin 81 MG tablet Take 81 mg by mouth daily.      . Bisacodyl (DULCOLAX PO) Take 1 tablet by mouth daily as needed (for consitpation).   . clobetasol (TEMOVATE) 0.05 % external solution APPLY ON THE SCALP NIGHTLY AS DIRECTED  . cyclobenzaprine (FLEXERIL) 10 MG tablet Take 1 tablet (10 mg total) by mouth 3 (three) times daily as needed for muscle spasms.  . diazepam (VALIUM) 5 MG tablet Take 5 mg by mouth every 6 (six) hours as needed for anxiety.   . dutasteride (AVODART) 0.5 MG capsule Take 0.5 mg by mouth daily.  . fluticasone (FLONASE) 50 MCG/ACT nasal spray USE 2 SPRAYS IN EACH NOSTRIL DAILY  . hydrocortisone 2.5 % cream apply to FACE twice a day as directed  . hydrocortisone cream 0.5 % Apply 1 application topically 2 (two) times daily. Patient takes as needed  . ipratropium (ATROVENT) 0.03 % nasal spray USE 2 SPRAYS INTO BOTH NOSTRILS EVERY 12 HOURS  . lisinopril (PRINIVIL,ZESTRIL) 10 MG tablet Take 1 tablet (10 mg total) by mouth daily.  . meloxicam (MOBIC) 15 MG tablet Take 1 tablet (15 mg total) by mouth daily.  . metoprolol succinate (TOPROL XL) 25 MG 24 hr tablet Take  1 tablet (25 mg total) by mouth daily.  . nitroGLYCERIN (NITROSTAT) 0.4 MG SL tablet Place 0.4 mg under the tongue every 5 (five) minutes as needed for chest pain (up to 3 doses only).  . pantoprazole (PROTONIX) 40 MG tablet Take 1 tablet (40 mg total) by mouth daily.  . Psyllium (MEDI-MUCIL PO) Take 240 g by mouth daily. Every morning   . rosuvastatin (CRESTOR) 40 MG tablet Take 1 tablet (40 mg total) by mouth daily.  . traMADol (ULTRAM) 50 MG tablet Take 50 mg by mouth every 12 (twelve) hours as needed (pain).  . triamcinolone cream (KENALOG) 0.1 % APPLY ON THE SKIN TWICE A DAY APPLY TO BACK AND [name] LEGS TWICE A DAY FOR 2 TO 4 WEEKS AS DIRECTED  . Vitamin D, Ergocalciferol, (DRISDOL) 50000 units CAPS capsule Take 1 capsule (50,000 Units total) by mouth every 7 (seven) days.  Marland Kitchen zolpidem (AMBIEN) 10 MG tablet take 1 tablet by mouth at bedtime if needed for sleep    No facility-administered encounter medications on file as of 06/09/2016.      Behavioral Observations:   Appearance: Neatly and appropriately dressed and groomed Gait: Ambulated independently, no abnormalities observed Speech: Fluent; normal rate, rhythm and volume. Minimal word finding difficulty. Thought process: Linear, goal directed Affect: Full, euthymic Interpersonal: Very pleasant, appropriate   TESTING: There is medical necessity to proceed with neuropsychological assessment as the results will be used to aid in differential diagnosis and clinical decision-making and to inform specific treatment recommendations. The patient has a history of recurrent visual disturbance which may reflect CNS involvement.   PLAN: The patient will return for a full battery of neuropsychological testing with a psychometrician under my supervision. Education regarding testing procedures was provided. Subsequently, the patient will see this provider for a follow-up session at which time his test performances and my impressions and treatment recommendations will be reviewed in detail.   Full neuropsychological evaluation report to follow.

## 2016-06-10 ENCOUNTER — Ambulatory Visit (INDEPENDENT_AMBULATORY_CARE_PROVIDER_SITE_OTHER): Payer: Medicare Other | Admitting: Psychology

## 2016-06-10 DIAGNOSIS — R4189 Other symptoms and signs involving cognitive functions and awareness: Secondary | ICD-10-CM

## 2016-06-10 NOTE — Progress Notes (Signed)
   Neuropsychology Note  [name] [name] returned today for 2 hours of neuropsychological testing with technician, Milana Kidney, BS, under the supervision of Dr. Macarthur Critchley. The patient did not appear overtly distressed by the testing session, per behavioral observation or via self-report to the technician. Rest breaks were offered. [name] [name] will return within 2 weeks for a feedback session with Dr. Si Raider at which time his test performances, clinical impressions and treatment recommendations will be reviewed in detail. The patient understands he can contact our office should he require our assistance before this time.  Full report to follow.

## 2016-06-18 ENCOUNTER — Encounter: Payer: Self-pay | Admitting: Physician Assistant

## 2016-06-18 ENCOUNTER — Ambulatory Visit (INDEPENDENT_AMBULATORY_CARE_PROVIDER_SITE_OTHER): Payer: Medicare Other | Admitting: Physician Assistant

## 2016-06-18 VITALS — BP 122/66 | HR 80 | Ht 70.0 in | Wt 148.0 lb

## 2016-06-18 DIAGNOSIS — K219 Gastro-esophageal reflux disease without esophagitis: Secondary | ICD-10-CM | POA: Diagnosis not present

## 2016-06-18 DIAGNOSIS — R1013 Epigastric pain: Secondary | ICD-10-CM

## 2016-06-18 NOTE — Patient Instructions (Signed)
Follow up as needed

## 2016-06-18 NOTE — Progress Notes (Signed)
Chief Complaint: Epigastric abdominal pain  HPI:  [name] [name] is an 83 year old Caucasian male with a past medical history of anxiety, CAD, status post stent in 2008, carotid stenosis, colon polyps, hemorrhoids, hypertension and hyperlipidemia and IBS who has followed with Dr. Henrene Pastor in the past for his screening colonoscopies and presents to clinic today with a complaint of epigastric abdominal pain.   Patient's last colonoscopy was performed 08/27/13 due to his personal history of adenomatous polyps with findings of a diminutive polyp in the transverse colon, mild diverticulosis in the sigmoid colon and otherwise normal colonoscopy.   Today, the patient presents to clinic and tells me that his brother passed away in 05/25/23. Apparently he was scheduled for a cholecystectomy and during that time they found "cancer", previous to this to the patient was having abdominal pain which led them to this discovery. Apparently his brother died 9 days later. The patient tells me that he and his brother were very close and he "feels like they were close genetically". He tells me that around January 5 he also started with epigastric abdominal pain. He tells me this pain is a dull ache which starts when he wakes up in the morning, he rates it as a 2/10 in severity, this continues until around lunchtime and then dissipates and the patient will no longer feel it. Patient describes that over the past 2 weeks he has only had this pain about 2 days out of the week, so it has definitely decreased in frequency.   Patient's social history is positive for being depressed/anxious after loss of his brother, he tells me today that maybe "if he would have told someone about the pain sooner, maybe someone could have done something".    Patient denies any nausea, vomiting, heartburn, reflux, dysphagia, symptoms that awaken him at night, increased gas or bloating, change in bowel habits, fever, chills, blood in his stool, melena, weight  loss, fatigue, anorexia or other.   Past Medical History:  Diagnosis Date  . Anxiety   . CAD (coronary artery disease)    a. s/p stent to LAD 2008;  b. abnormal ETT 11/13 with VTach => LHC pLAD 30% ISR, mCFX 30%, dRCA 60-75% which had progressed from previous study in 2010 => FFR of RCA 0.92 => med Rx   . Carotid stenosis    dopplers 1/14:  0-39% bilateral ICA stenosis  . Colon polyps   . Colon polyps    adenomatous  . Diverticular disease of colon   . Hemorrhoids    external and internal  . History of BPH   . HTN (hypertension)   . Hx of echocardiogram    a. Echo 9/11:  EF 55-60%, normal motion, grade 2 diastolic dysfunction, mild MR, mild LAE, mild RAE, PASP 33.  Marland Kitchen Hyperlipidemia   . IBS (irritable bowel syndrome)   . Rhinitis   . Stroke, lacunar ([place]) 08/24/2012   Old, 2011 head mri    Past Surgical History:  Procedure Laterality Date  . APPENDECTOMY  1956  . CORONARY STENT PLACEMENT  2008    Successful PCI of the lesion in the proximal LAD using a Promus   . HERNIA REPAIR Right 2005  . NASAL SINUS SURGERY  1975  . PERCUTANEOUS CORONARY STENT INTERVENTION (PCI-S) N/A 05/03/2012   Procedure: PERCUTANEOUS CORONARY STENT INTERVENTION (PCI-S);  Surgeon: Sherren Mocha, MD;  Location: [facility] C[facility] LAB;  Service: Cardiovascular;  Laterality: N/A;  . Promus DES ok for 3T MRI  2008  Current Outpatient Prescriptions  Medication Sig Dispense Refill  . ALPRAZolam (XANAX) 0.25 MG tablet take 1/2 to 1 tablet by mouth at bedtime if needed for sleep 30 tablet 0  . aspirin 81 MG tablet Take 81 mg by mouth daily.      . Bisacodyl (DULCOLAX PO) Take 1 tablet by mouth daily as needed (for consitpation).     . clobetasol (TEMOVATE) 0.05 % external solution APPLY ON THE SCALP NIGHTLY AS DIRECTED  0  . cyclobenzaprine (FLEXERIL) 10 MG tablet Take 1 tablet (10 mg total) by mouth 3 (three) times daily as needed for muscle spasms. 30 tablet 0  . diazepam (VALIUM) 5 MG tablet Take 5 mg by mouth  every 6 (six) hours as needed for anxiety.     . dutasteride (AVODART) 0.5 MG capsule Take 0.5 mg by mouth daily.    . fluticasone (FLONASE) 50 MCG/ACT nasal spray USE 2 SPRAYS IN EACH NOSTRIL DAILY 32 g 11  . hydrocortisone 2.5 % cream apply to FACE twice a day as directed  0  . hydrocortisone cream 0.5 % Apply 1 application topically 2 (two) times daily. Patient takes as needed    . ipratropium (ATROVENT) 0.03 % nasal spray USE 2 SPRAYS INTO BOTH NOSTRILS EVERY 12 HOURS 90 mL 2  . lisinopril (PRINIVIL,ZESTRIL) 10 MG tablet Take 1 tablet (10 mg total) by mouth daily. 90 tablet 3  . meloxicam (MOBIC) 15 MG tablet Take 1 tablet (15 mg total) by mouth daily. 30 tablet 0  . metoprolol succinate (TOPROL XL) 25 MG 24 hr tablet Take 1 tablet (25 mg total) by mouth daily. 90 tablet 3  . nitroGLYCERIN (NITROSTAT) 0.4 MG SL tablet Place 0.4 mg under the tongue every 5 (five) minutes as needed for chest pain (up to 3 doses only).    . pantoprazole (PROTONIX) 40 MG tablet Take 1 tablet (40 mg total) by mouth daily. 90 tablet 3  . Psyllium (MEDI-MUCIL PO) Take 240 g by mouth daily. Every morning     . rosuvastatin (CRESTOR) 40 MG tablet Take 1 tablet (40 mg total) by mouth daily. 30 tablet 11  . traMADol (ULTRAM) 50 MG tablet Take 50 mg by mouth every 12 (twelve) hours as needed (pain).    . triamcinolone cream (KENALOG) 0.1 % APPLY ON THE SKIN TWICE A DAY APPLY TO BACK AND LOWER LEGS TWICE A DAY FOR 2 TO 4 WEEKS AS DIRECTED  0  . Vitamin D, Ergocalciferol, (DRISDOL) 50000 units CAPS capsule Take 1 capsule (50,000 Units total) by mouth every 7 (seven) days. 12 capsule 0  . zolpidem (AMBIEN) 10 MG tablet take 1 tablet by mouth at bedtime if needed for sleep 30 tablet 5   No current facility-administered medications for this visit.     Allergies as of 06/18/2016  . (No Known Allergies)    Family History  Problem Relation Age of Onset  . Coronary artery disease Father   . Heart attack Father   . Rheum  arthritis Mother   . Cancer Brother     ? TYPE  . Colon cancer Neg Hx     Social History   Social History  . Marital status: Married    Spouse name: N/A  . Number of children: 3  . Years of education: 65   Occupational History  . businessman, real Data processing manager Retired   Social History Main Topics  . Smoking status: Never Smoker  . Smokeless tobacco: Never Used  . Alcohol  use Yes     Comment: 4-6 drinks/week  . Drug use: No  . Sexual activity: Yes    Partners: Female   Other Topics Concern  . Not on file   Social History Narrative   HSG, 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 He has two daughters, 1 in [place] 1 in [place] (Dec '12) and a son who works with him. Several grandchildren.Reports that he spends a good deal of time at his home in [place] which he greatly enjoys and his home in the [place]. Enjoys driving his BMW [place] when [place].      ACP - Yes CPR, yes for short-term mechanical ventilation for reversible disease. Recommended [email or web address] for consideration.    Review of Systems:     Constitutional: No weight loss, fever, chills, weakness or fatigue HEENT: Eyes: No change in vision               Ears, Nose, Throat:  No change in hearing Skin: No rash or itching Cardiovascular: No chest pain, chest pressure or palpitations   Respiratory: No SOB or cough Gastrointestinal: See HPI and otherwise negative Genitourinary: No dysuria or change in urinary frequency Neurological: No headache, dizziness or syncope Musculoskeletal: No new muscle or joint pain Hematologic: No bleeding or bruising Psychiatric: Positive for anxiety   Physical Exam:  Vital signs: BP 122/66   Pulse 80   Ht 5\' 10"  (1.778 m)   Wt 148 lb (67.1 kg)   BMI 21.24 kg/m   Constitutional:   Very pleasant elderly Caucasian male appears to be in NAD, Well  developed, Well nourished, alert and cooperative Head:  Normocephalic and atraumatic. Eyes:   PEERL, EOMI. No icterus. Conjunctiva pink. Ears:  Normal auditory acuity. Neck:  Supple Throat: Oral cavity and pharynx without inflammation, swelling or lesion.  Respiratory: Respirations even and unlabored. Lungs clear to auscultation bilaterally.   No wheezes, crackles, or rhonchi.  Cardiovascular: Normal S1, S2. No MRG. Regular rate and rhythm. No peripheral edema, cyanosis or pallor.  Gastrointestinal:  Soft, nondistended, nontender. No rebound or guarding. Normal bowel sounds. No appreciable masses or hepatomegaly. Rectal:  Not performed.  Msk:  Symmetrical without gross deformities. Without edema, no deformity or joint abnormality.  Neurologic:  Alert and  oriented x4;  grossly normal neurologically.  Skin:   Dry and intact without significant lesions or rashes. Psychiatric: Demonstrates good judgement and reason without abnormal affect or behaviors.  No recent labs or imaging.  Assessment: 1. Epigastric abdominal pain: Patient describes epigastric abdominal pain which is rated as a 2/10 and is there when he wakes up in the morning and dissipates by the afternoon, now 2/7 days a week, no change in appetite, no nausea, vomiting, heartburn or reflux, no change in bowel habits, significant stress/anxiety recently after the loss of his brother with unsuspected liver cancer; discussed with the patient that I believe his symptoms are likely functional in nature as this pain started after his brother passed away in June 13, 2023 and has already started to decrease in frequency, with no associated weight loss or other red flags 2. GERD: Controlled on pantoprazole 40mg  qd  Plan: 1. Discussed with the patient that we could do a full workup, but likely his symptoms are functional in nature. He would like to wait and see how he does over the next couple of months. 2. Patient was advised to call our clinic if  he  has increase in severity or frequency of his symptoms or experiences any alarm symptoms which we discussed today including weight loss, change in appetite, fever, chills, nausea, vomiting, heartburn or reflux. He verbalized understanding. 3. Continue Pantoprazole 40mg  qd 4. Patient to follow in clinic as needed in the future. He should follow with Dr. Henrene Pastor or myself.  Ellouise Newer, PA-C [facility] 06/18/2016, 10:35 AM  Cc: [name], MD

## 2016-06-22 NOTE — Progress Notes (Signed)
If symptoms persist or worsen would consider imaging study

## 2016-06-30 NOTE — Progress Notes (Signed)
NEUROPSYCHOLOGICAL EVALUATION   Name:    [name] [name]  Date of Birth:   Mar 28, 1933 Date of Interview:  06/09/2016 Date of Testing:  06/10/2016   Date of Feedback:  07/01/2016       Background Information:  Reason for Referral:  [name] [name] is a 83 y.o., right-handed male referred by [name] [name] to assess his current level of cognitive functioning and assist in differential diagnosis. The current evaluation consisted of a review of available medical records, an interview with the patient, and the completion of a neuropsychological testing battery. Informed consent was obtained.  History of Presenting Problem:  [name] [name] saw [name] [name] almost a year ago, on 07/10/2015, for neurologic consultation due to recurrent transient visual disturbance. He had experienced episodes of central vision loss a few times in recent years. Most recent episode was over the Christmas holiday in 2016. [name] [name] did not feel these events were TIAs due to the unlikelihood of recurrent TIAs with habitual symptoms. Ocular migraines were considered possible. Occipital seizures were considered possible but not probable. A cardiac source was recommended to be considered, given that he had experienced a drop in heart rate during a spell. He underwent EEG on 07/16/2015 which was normal. A brain MRI on 06/13/2015 revealed no acute intracranial finding, with only minimal small vessel change of the white matter. He also had normal intracranial MRA of the large and medium vessels.   The patient reports no recurrence of visual disturbance since the episode in December 2016.   In correspondence from [name] [name] previous nurse, there is mention of previous neuropsychological evaluation with [name] [name] and a need for repeat neuropsychological evaluation. However, I do not see any records from [name] [name] in the patient's EHR, and the patient himself does not recall ever having testing done or seeing a [name] [name] in the past. The  patient has no complaints or concerns about cognitive functioning. Because he is still working full time doing cognitively complex tasks, we decided baseline testing would be helpful for him.   Upon direct questioning, the patient reported:   Forgetting recent conversations/events: No Repeating statements/questions: No Misplacing/losing items: No Forgetting appointments or other obligations: He recently did show up to an appointment on the wrong day. This is the only time this has happened, and it was a rescheduled appointment due to recent snowstorm.  Forgetting to take medications: No  Difficulty concentrating: No Starting but not finishing tasks: No Distracted easily: No Processing information more slowly: No  Word-finding difficulty: No Comprehension difficulty: No  Getting lost when driving: No Making wrong turns when driving: No Uncertain about directions when driving or passenger: No   Psychiatric history was denied. He denied any history of suicidal ideation or intention. He does have family history of probable bipolar disorder in his mother, who attempted suicide multiple times. Both of his parents were alcoholics. He had a difficult home life as a child, but he and his brother were very close and clearly very resilient. There was no physical abuse in the home.  There is no known family history of dementia.   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 He and his son work together. His work involves cognitively complex tasks such as Restaurant manager, fast food, making decisions about [employer] deals, speaking with bankers, making decisions about tax deferments, etc. He denies any difficulty performing these tasks.  [name] [name] lives with his wife in a  private residence. They also have homes in [facility] and [place]. He walks every day and tries to get 8000 steps in a day. He is independent in all  instrumental and basic ADLs.  He denies any medical or physical complaints at the present time. He has no pain. He has no difficulty with balance. He has not had any falls.   His mood is generally good. He denied any significant depression or anxiety. His brother, with whom he was very close, did pass away recently.   He denied sleep difficulty. He does take 2/3 Ambien tablet at night. He has been taking Ambien for 10 years. He used to take the whole tablet but decided to reduce it to 2/3.   He denies change or problem with appetite.   Social History: Born/Raised: [place]. Moved around a lot. Attended 16 different schools, including 4 different high schools. He went into the [employer] immediately after high school. Was in 4 years. Then worked his way through college at [employer].  Education: 4 year college degree Occupational history: Married x56 years.  Three children (son-[place], daughter-[place], daughter-[place]). 2 grandchildren in [place]. Alcohol/Tobacco/Substances: He has reduced the amount of alcohol he consumes. However, he never was drinking heavily or had a drinking problem. He generally has 2 drinks 2 times a week. Never a smoker. No SA.    Medical History:  Past Medical History:  Diagnosis Date  . Anxiety   . CAD (coronary artery disease)    a. s/p stent to LAD 2008;  b. abnormal ETT 11/13 with VTach => LHC pLAD 30% ISR, mCFX 30%, dRCA 60-75% which had progressed from previous study in 2010 => FFR of RCA 0.92 => med Rx   . Carotid stenosis    dopplers 1/14:  0-39% bilateral ICA stenosis  . Colon polyps   . Colon polyps    adenomatous  . Diverticular disease of colon   . Hemorrhoids    external and internal  . History of BPH   . HTN (hypertension)   . Hx of echocardiogram    a. Echo 9/11:  EF 55-60%, normal motion, grade 2 diastolic dysfunction, mild MR, mild LAE, mild RAE, PASP 33.  Marland Kitchen Hyperlipidemia   . IBS (irritable bowel syndrome)   . Rhinitis   . Stroke, lacunar  ([place]) 08/24/2012   Old, 2011 head mri    Current medications:  Outpatient Encounter Prescriptions as of 07/01/2016  Medication Sig  . ALPRAZolam (XANAX) 0.25 MG tablet take 1/2 to 1 tablet by mouth at bedtime if needed for sleep  . aspirin 81 MG tablet Take 81 mg by mouth daily.    . Bisacodyl (DULCOLAX PO) Take 1 tablet by mouth daily as needed (for consitpation).   . clobetasol (TEMOVATE) 0.05 % external solution APPLY ON THE SCALP NIGHTLY AS DIRECTED  . cyclobenzaprine (FLEXERIL) 10 MG tablet Take 1 tablet (10 mg total) by mouth 3 (three) times daily as needed for muscle spasms.  . diazepam (VALIUM) 5 MG tablet Take 5 mg by mouth every 6 (six) hours as needed for anxiety.   . dutasteride (AVODART) 0.5 MG capsule Take 0.5 mg by mouth daily.  . fluticasone (FLONASE) 50 MCG/ACT nasal spray USE 2 SPRAYS IN EACH NOSTRIL DAILY  . hydrocortisone 2.5 % cream apply to FACE twice a day as directed  . hydrocortisone cream 0.5 % Apply 1 application topically 2 (two) times daily. Patient takes as needed  . ipratropium (ATROVENT) 0.03 % nasal spray USE 2  SPRAYS INTO BOTH NOSTRILS EVERY 12 HOURS  . lisinopril (PRINIVIL,ZESTRIL) 10 MG tablet Take 1 tablet (10 mg total) by mouth daily.  . meloxicam (MOBIC) 15 MG tablet Take 1 tablet (15 mg total) by mouth daily.  . metoprolol succinate (TOPROL XL) 25 MG 24 hr tablet Take 1 tablet (25 mg total) by mouth daily.  . nitroGLYCERIN (NITROSTAT) 0.4 MG SL tablet Place 0.4 mg under the tongue every 5 (five) minutes as needed for chest pain (up to 3 doses only).  . pantoprazole (PROTONIX) 40 MG tablet Take 1 tablet (40 mg total) by mouth daily.  . Psyllium (MEDI-MUCIL PO) Take 240 g by mouth daily. Every morning   . rosuvastatin (CRESTOR) 40 MG tablet Take 1 tablet (40 mg total) by mouth daily.  . traMADol (ULTRAM) 50 MG tablet Take 50 mg by mouth every 12 (twelve) hours as needed (pain).  . triamcinolone cream (KENALOG) 0.1 % APPLY ON THE SKIN TWICE A DAY APPLY TO  BACK AND LOWER LEGS TWICE A DAY FOR 2 TO 4 WEEKS AS DIRECTED  . Vitamin D, Ergocalciferol, (DRISDOL) 50000 units CAPS capsule Take 1 capsule (50,000 Units total) by mouth every 7 (seven) days.  Marland Kitchen zolpidem (AMBIEN) 10 MG tablet take 1 tablet by mouth at bedtime if needed for sleep   No facility-administered encounter medications on file as of 07/01/2016.      Current Examination:  Behavioral Observations:   Appearance: Neatly and appropriately dressed and groomed Gait: Ambulated independently, no abnormalities observed Speech: Fluent; normal rate, rhythm and volume. Minimal word finding difficulty. Thought process: Linear, goal directed Affect: Full, euthymic Interpersonal: Very pleasant, appropriate Orientation: Oriented to all spheres. Accurately named the current President and his predecessor.  Tests Administered: . Test of Premorbid Functioning (TOPF) . Wechsler Adult Intelligence Scale-Fourth Edition (WAIS-IV): Similarities, Block Design, Matrix Reasoning, Coding and Digit Span subtests . Wechsler Memory Scale-Fourth Edition (WMS-IV) Older Adult Version (ages 82-90): Logical Memory I, II and Recognition subtests  . [employer] Verbal Learning Test - 2nd Edition (CVLT-2) Short Form . Repeatable Battery for the Assessment of Neuropsychological Status (RBANS) Form A:  Figure Copy and Recall subtests, Semantic Fluency subtest and Line Orientation subtest . Neuropsychological Assessment Battery (NAB) Language Module, Form 1: Naming Subtest . Boston Diagnostic Aphasia Examination: Complex Ideational Material subtest . Controlled Oral Word Association Test (COWAT) . Trail Making Test A and B . Clock drawing test . [employer] [facility]) . Generalized Anxiety Disorder - 7 item screener (GAD-7) . Beck Depression Inventory - Second edition (BDI-II)  Test Results: Note: Standardized scores are presented only for use by appropriately trained professionals and to allow for any  future test-retest comparison. These scores should not be interpreted without consideration of all the information that is contained in the rest of the report. The most recent standardization samples from the test publisher or other sources were used whenever possible to derive standard scores; scores were corrected for age, gender, ethnicity and education when available.   Test Scores:  Test Name Raw Score Standardized Score Descriptor  TOPF 46/70 SS= 104 Average  WAIS-IV Subtests     Similarities 26/36 ss= 12 High average  Block Design 32/66 ss= 12 High average  Matrix Reasoning 21/26 ss= 17 Very superior  Coding 62/135 ss= 14 Superior  Digit Span  30/48 ss= 14 Superior  WMS-IV Subtests     LM I 37/53 ss= 14 Superior  LM II 23/39 ss= 13 High average  LM II Recognition 21/23  Cum %: >75 Above average  RBANS Subtests     Figure Copy 20/20 Z= 1.4 Superior  Figure Recall 19/20 Z= 1.9 Superior  Semantic Fluency 22 Z= 1.2 High average  Line Orientation 20/20 Z= 1.7 Superior  CVLT-II Scores     Trial 1 6/9 Z= 1.5 Superior  Trial 4 8/9 Z= 2 Very superior  Trials 1-4 total 29/36 T= 76 Very superior  SD Free Recall 9/9 Z= 4.5 Very superior  LD Free Recall 7/9 Z= 1.5 Superior  LD Cued Recall 7/9 Z= 1.5 Superior  Recognition Discriminability 9/9 hits, 2 false positives Z= 1 High average  Forced Choice Recognition 9/9  WNL  NAB Naming 31/31 T= 63 Superior  BDAE Subtest     Complex Ideational Material 12/12  WNL  COWAT-FAS 34 T= 47 Average  COWAT-Animals 26 T= 73 Very superior  Trail Making Test A 33" 0 errors T= 61 High average  Trail Making Test B  68" 0 errors T= 60 High average  Clock Drawing   WNL   WCST     Total Errors 32 T= 41 Low average  Perseverative Responses 15 T= 54 Average  Perseverative Errors 15 T= 50 Average  Conceptual Level Responses 22 T=42 Low average  Categories Completed 2 >16% WNL  Trials to Complete 1st Category 11 >16% WNL  Failure to Maintain Set 0  WNL    GAD-7 0/21  WNL   BDI-II 1/63  WNL      Description of Test Results:  Premorbid verbal intellectual abilities were estimated to have been within the average range based on a test of word reading. Psychomotor processing speed was superior. Auditory attention and working memory were superior. Visual-spatial construction was high average to superior. Visual-spatial perception on a line orientation task was superior. Language abilities were intact. Specifically, confrontation naming was superior, and semantic verbal fluency was high average to very superior. Auditory comprehension of complex ideational material was intact. With regard to verbal memory, encoding and acquisition of non-contextual information (i.e., word list) was very superior. After a brief distracter task, free recall was very superior. After a delay, free recall was superior. Cued recall was superior. Performance on a yes/no recognition task was high average. On another verbal memory test, encoding and acquisition of contextual auditory information (i.e., short stories) was superior. After a delay, free recall was high average. Performance on a yes/no recognition task was above average. With regard to non-verbal memory, delayed free recall of visual information was superior. Executive functioning was intact. Mental flexibility and set-shifting were high average on Trails B. Verbal fluency with phonemic search restrictions was average. Verbal abstract reasoning was high average. Non-verbal abstract reasoning was very superior. Deductive reasoning and problem solving were within normal limits. Performance on a clock drawing task was intact. On self-report questionnaires, the patient's responses were not  indicative of clinically significant depression or anxiety at the present time.    Clinical Impressions: No diagnosis. Results of cognitive testing were entirely within normal limits, with no areas of impairment or even below average  performance. Most performances fell within the high average to very superior range for his age. There is no indication of cognitive disorder or underlying dementia. There also is no indication of primary psychiatric disorder or psychosocial stress.    Recommendations/Plan: Based on the findings of the present evaluation, the following recommendations are offered:  1. These results will serve as a nice baseline for future comparison should it ever be needed. 2. The  patient is encouraged to continue to engage in activities which provide mental stimulation, cardiovascular exercise and social interaction, in order to maintain brain health. 3. I have no concerns about the patient continuing to work, based on these testing results.   Feedback to Patient: [name] [name] returned for a feedback appointment on 07/01/2016 to review the results of his neuropsychological evaluation with this provider. 10 minutes face-to-face time was spent reviewing his test results, my impressions and my recommendations as detailed above.    Total time spent on this patient's case: [id number] unit for interview with psychologist; [phone] units of testing by psychometrician under psychologist's supervision; [phone] units for medical record review, scoring of neuropsychological tests, interpretation of test results, preparation of this report, and review of results to the patient by psychologist.      Thank you for your referral of [name] [name]. Please feel free to contact me if you have any questions or concerns regarding this report.

## 2016-07-01 ENCOUNTER — Encounter: Payer: Self-pay | Admitting: Psychology

## 2016-07-01 ENCOUNTER — Ambulatory Visit (INDEPENDENT_AMBULATORY_CARE_PROVIDER_SITE_OTHER): Payer: Medicare Other | Admitting: Psychology

## 2016-07-01 DIAGNOSIS — R4189 Other symptoms and signs involving cognitive functions and awareness: Secondary | ICD-10-CM

## 2016-07-01 NOTE — Patient Instructions (Signed)
Results of cognitive testing were entirely within normal limits, with no areas of impairment or even below average performance. Most performances fell within the high average to very superior range for his age. There is no indication of cognitive disorder or underlying dementia. There also is no indication of primary psychiatric disorder or psychosocial stress.    Recommendations/Plan: Based on the findings of the present evaluation, the following recommendations are offered:  1. These results will serve as a nice baseline for future comparison should it ever be needed. 2. The patient is encouraged to continue to engage in activities which provide mental stimulation, cardiovascular exercise and social interaction, in order to maintain brain health. 3. I have no concerns about the patient continuing to work, based on these testing results.

## 2016-07-05 DIAGNOSIS — Z85820 Personal history of malignant melanoma of skin: Secondary | ICD-10-CM | POA: Diagnosis not present

## 2016-07-05 DIAGNOSIS — L570 Actinic keratosis: Secondary | ICD-10-CM | POA: Diagnosis not present

## 2016-07-05 DIAGNOSIS — Z85828 Personal history of other malignant neoplasm of skin: Secondary | ICD-10-CM | POA: Diagnosis not present

## 2016-07-06 ENCOUNTER — Encounter: Payer: Self-pay | Admitting: Family Medicine

## 2016-07-06 ENCOUNTER — Ambulatory Visit (INDEPENDENT_AMBULATORY_CARE_PROVIDER_SITE_OTHER)
Admission: RE | Admit: 2016-07-06 | Discharge: 2016-07-06 | Disposition: A | Discharge status: Home / Self Care | Payer: Medicare Other | Source: Ambulatory Visit | Attending: Family Medicine | Admitting: Family Medicine

## 2016-07-06 ENCOUNTER — Ambulatory Visit (INDEPENDENT_AMBULATORY_CARE_PROVIDER_SITE_OTHER): Payer: Medicare Other | Admitting: Family Medicine

## 2016-07-06 VITALS — BP 138/64 | HR 68 | Ht 70.0 in | Wt 149.0 lb

## 2016-07-06 DIAGNOSIS — M542 Cervicalgia: Secondary | ICD-10-CM

## 2016-07-06 DIAGNOSIS — M5412 Radiculopathy, cervical region: Secondary | ICD-10-CM

## 2016-07-06 MED ORDER — PREDNISONE 50 MG PO TABS: 50.0000 mg | ORAL_TABLET | Freq: Every day | ORAL | 0 refills | Status: DC

## 2016-07-06 MED ORDER — GABAPENTIN 100 MG PO CAPS: 200.0000 mg | ORAL_CAPSULE | Freq: Every day | ORAL | 3 refills | Status: DC

## 2016-07-06 NOTE — Progress Notes (Signed)
CC: Right shoulder pain.  HPI: Patient is here for right shoulder pain. Has had a contralateral sign acromion clavicular arthritis previously. Patient states that that didn't respond very well to injection. States that this feels somewhat different. Seems to be mostly pain at night. States that as a throbbing sensation that goes from a shoulder all the way down to his hand. Patient states that there can be some numbness. Wakes him up at night. Takes many minutes before it seemed to resolve when he is up and moving. The severity of pain a 7 out of 10 and seems to be worsening. States that in the last 3 nights he is been scared to even go to sleep because of the pain. Her in the day seems to do relatively well with some very mild aching. States that there is no specific movements. Seems that the lack of movement seems to be possibly make it worse.  Past Medical History:  Diagnosis Date  . Anxiety   . CAD (coronary artery disease)    a. s/p stent to LAD 2008;  b. abnormal ETT 11/13 with VTach => LHC pLAD 30% ISR, mCFX 30%, dRCA 60-75% which had progressed from previous study in 2010 => FFR of RCA 0.92 => med Rx   . Carotid stenosis    dopplers 1/14:  0-39% bilateral ICA stenosis  . Colon polyps   . Colon polyps    adenomatous  . Diverticular disease of colon   . Hemorrhoids    external and internal  . History of BPH   . HTN (hypertension)   . Hx of echocardiogram    a. Echo 9/11:  EF 55-60%, normal motion, grade 2 diastolic dysfunction, mild MR, mild LAE, mild RAE, PASP 33.  Marland Kitchen Hyperlipidemia   . IBS (irritable bowel syndrome)   . Rhinitis   . Stroke, lacunar ([place]) 08/24/2012   Old, 2011 head mri   Past Surgical History:  Procedure Laterality Date  . APPENDECTOMY  1956  . CORONARY STENT PLACEMENT  2008    Successful PCI of the lesion in the proximal LAD using a Promus   . HERNIA REPAIR Right 2005  . NASAL SINUS SURGERY  1975  . PERCUTANEOUS CORONARY STENT INTERVENTION (PCI-S) N/A  05/03/2012   Procedure: PERCUTANEOUS CORONARY STENT INTERVENTION (PCI-S);  Surgeon: Sherren Mocha, MD;  Location: [facility] CATH LAB;  Service: Cardiovascular;  Laterality: N/A;  . Promus DES ok for 3T MRI  2008   Social History  Substance Use Topics  . Smoking status: Never Smoker  . Smokeless tobacco: Never Used  . Alcohol use Yes     Comment: 4-6 drinks/week   No Known Allergies no known drug allergies Family History  Problem Relation Age of Onset  . Coronary artery disease Father   . Heart attack Father   . Rheum arthritis Mother   . Cancer Brother     ? TYPE  . Colon cancer Neg Hx    .  Past medical, surgical, family and social history reviewed. Medications reviewed all in the electronic medical record.   Review of Systems: No headache, visual changes, nausea, vomiting, diarrhea, constipation, dizziness, abdominal pain, skin rash, fevers, chills, night sweats, weight loss, swollen lymph nodes, body aches, joint swelling, muscle aches, chest pain, shortness of breath, mood changes.  .   Objective:    Blood pressure 138/64, pulse 68, height 5\' 10"  (1.778 m), weight 149 lb (67.6 kg).   Systems examined below as of 07/06/16 General: NAD A&O x3  mood, affect normal  HEENT: Pupils equal, extraocular movements intact no nystagmus Respiratory: not short of breath at rest or with speaking Cardiovascular: No lower extremity edema, non tender Skin: Warm dry intact with no signs of infection or rash on extremities or on axial skeleton. Abdomen: Soft nontender, no masses Neuro: Cranial nerves  intact, neurovascularly intact in all extremities with 2+ DTRs and 2+ pulses. Lymph: No lymphadenopathy appreciated today  Gait normal with good balance and coordination.  MSK: Non tender with full range of motion and good stability and symmetric strength and tone of , elbows, wrist,  knee hips and ankles bilaterally.   Shoulder: Right Inspection reveals no abnormalities, atrophy or  asymmetry. Palpation is normal with no tenderness over AC joint or bicipital groove. ROM is full in all planes. Rotator cuff strength normal throughout. Minimal impingement signs Speeds and Yergason's tests normal. No labral pathology noted with negative Obrien's, negative clunk and good stability. Normal scapular function observed. No painful arc and no drop arm sign. No apprehension sign Contralateral shoulder shows some positive crossover with still some mild tenderness over the acromial clavicular joint    Neck: Inspection unremarkable. No palpable stepoffs. Positive Spurling's maneuver. I last 10 of extension as well as right-sided side bending. Grip strength and sensation normal in bilateral hands Strength good C4 to T1 distribution No sensory change to C4 to T1 Negative Hoffman sign bilaterally Reflexes normal  Impression and Recommendations:     This case required medical decision making of moderate complexity.

## 2016-07-06 NOTE — Assessment & Plan Note (Signed)
Patient is having signs and symptoms and more of a cervical radiculitis. Patient has x-rays are pending. We discussed gabapentin at night. We discussed prednisone with [place] going [place]. Depending on how patient responds patient come back in the next 2 weeks. Worsening symptoms we'll need to consider advanced imaging. Patient has improvement then we will consider home exercises including possible formal physical therapy. We can also consider injection in the shoulder necessary to rule out any other bony normality back and be contribute in.

## 2016-07-06 NOTE — Patient Instructions (Signed)
Good to see you  Alvera Singh is your friend. Ice 20 minutes 2 times daily. Usually after activity and before bed. Exercises 3 times a week.  Have your wife get you a new pillow.  Gabapentin 200mg  at night  If not better by Thursday please take prednisone daily for 5 days.  See me again in 2 weeks.  Travel safe.

## 2016-07-07 ENCOUNTER — Ambulatory Visit: Payer: Medicare Other | Admitting: Internal Medicine

## 2016-07-12 ENCOUNTER — Encounter: Payer: Medicare Other | Admitting: Internal Medicine

## 2016-07-16 DIAGNOSIS — Z85820 Personal history of malignant melanoma of skin: Secondary | ICD-10-CM | POA: Diagnosis not present

## 2016-07-16 DIAGNOSIS — D485 Neoplasm of uncertain behavior of skin: Secondary | ICD-10-CM | POA: Diagnosis not present

## 2016-07-16 DIAGNOSIS — C44529 Squamous cell carcinoma of skin of other part of trunk: Secondary | ICD-10-CM | POA: Diagnosis not present

## 2016-07-16 DIAGNOSIS — Z85828 Personal history of other malignant neoplasm of skin: Secondary | ICD-10-CM | POA: Diagnosis not present

## 2016-07-16 DIAGNOSIS — L570 Actinic keratosis: Secondary | ICD-10-CM | POA: Diagnosis not present

## 2016-07-20 ENCOUNTER — Other Ambulatory Visit (INDEPENDENT_AMBULATORY_CARE_PROVIDER_SITE_OTHER): Payer: Medicare Other

## 2016-07-20 ENCOUNTER — Encounter: Payer: Self-pay | Admitting: Internal Medicine

## 2016-07-20 ENCOUNTER — Ambulatory Visit (INDEPENDENT_AMBULATORY_CARE_PROVIDER_SITE_OTHER): Payer: Medicare Other | Admitting: Internal Medicine

## 2016-07-20 ENCOUNTER — Ambulatory Visit (INDEPENDENT_AMBULATORY_CARE_PROVIDER_SITE_OTHER): Payer: Medicare Other | Admitting: Family Medicine

## 2016-07-20 ENCOUNTER — Encounter: Payer: Self-pay | Admitting: Family Medicine

## 2016-07-20 VITALS — BP 136/70 | HR 65 | Temp 98.5°F | Resp 16 | Ht 70.0 in | Wt 148.8 lb

## 2016-07-20 DIAGNOSIS — M5412 Radiculopathy, cervical region: Secondary | ICD-10-CM | POA: Diagnosis not present

## 2016-07-20 DIAGNOSIS — I10 Essential (primary) hypertension: Secondary | ICD-10-CM

## 2016-07-20 DIAGNOSIS — E785 Hyperlipidemia, unspecified: Secondary | ICD-10-CM | POA: Diagnosis not present

## 2016-07-20 DIAGNOSIS — Z0000 Encounter for general adult medical examination without abnormal findings: Secondary | ICD-10-CM

## 2016-07-20 DIAGNOSIS — I739 Peripheral vascular disease, unspecified: Secondary | ICD-10-CM | POA: Insufficient documentation

## 2016-07-20 DIAGNOSIS — I2510 Atherosclerotic heart disease of native coronary artery without angina pectoris: Secondary | ICD-10-CM

## 2016-07-20 LAB — CBC WITH DIFFERENTIAL/PLATELET
BASOS PCT: 0.8 % (ref 0.0–3.0)
Basophils Absolute: 0 10*3/uL (ref 0.0–0.1)
EOS ABS: 0.2 10*3/uL (ref 0.0–0.7)
Eosinophils Relative: 3.1 % (ref 0.0–5.0)
HEMATOCRIT: 41.5 % (ref 39.0–52.0)
Hemoglobin: 14.1 g/dL (ref 13.0–17.0)
LYMPHS PCT: 25.9 % (ref 12.0–46.0)
Lymphs Abs: 1.4 10*3/uL (ref 0.7–4.0)
MCHC: 33.9 g/dL (ref 30.0–36.0)
MCV: 89.7 fl (ref 78.0–100.0)
Monocytes Absolute: 0.6 10*3/uL (ref 0.1–1.0)
Monocytes Relative: 10.9 % (ref 3.0–12.0)
NEUTROS ABS: 3.1 10*3/uL (ref 1.4–7.7)
NEUTROS PCT: 59.3 % (ref 43.0–77.0)
Platelets: 194 10*3/uL (ref 150.0–400.0)
RBC: 4.63 Mil/uL (ref 4.22–5.81)
RDW: 13.7 % (ref 11.5–15.5)
WBC: 5.2 10*3/uL (ref 4.0–10.5)

## 2016-07-20 LAB — COMPREHENSIVE METABOLIC PANEL
ALT: 17 U/L (ref 0–53)
AST: 19 U/L (ref 0–37)
Albumin: 4.1 g/dL (ref 3.5–5.2)
Alkaline Phosphatase: 54 U/L (ref 39–117)
BILIRUBIN TOTAL: 0.5 mg/dL (ref 0.2–1.2)
BUN: 13 mg/dL (ref 6–23)
CALCIUM: 9.5 mg/dL (ref 8.4–10.5)
CHLORIDE: 108 meq/L (ref 96–112)
CO2: 27 meq/L (ref 19–32)
CREATININE: 1.03 mg/dL (ref 0.40–1.50)
GFR: 73.63 mL/min (ref >60.00)
GLUCOSE: 93 mg/dL (ref 70–99)
Potassium: 4.2 mEq/L (ref 3.5–5.1)
SODIUM: 142 meq/L (ref 135–145)
Total Protein: 6.6 g/dL (ref 6.0–8.3)

## 2016-07-20 LAB — THYROID PANEL WITH TSH
Free Thyroxine Index: 2.2 (ref 1.4–3.8)
T3 UPTAKE: 30 % (ref 22–35)
T4, Total: 7.2 ug/dL (ref 4.5–12.0)
TSH: 3.12 mIU/L (ref 0.40–4.50)

## 2016-07-20 LAB — LIPID PANEL
CHOL/HDL RATIO: 2
Cholesterol: 124 mg/dL (ref 0–200)
HDL: 54.5 mg/dL (ref >39.00)
LDL CALC: 47 mg/dL (ref 0–99)
NONHDL: 69.73
Triglycerides: 113 mg/dL (ref 0.0–149.0)
VLDL: 22.6 mg/dL (ref 0.0–40.0)

## 2016-07-20 NOTE — Progress Notes (Signed)
Pre-visit discussion using our clinic review tool. No additional management support is needed unless otherwise documented below in the visit note.  

## 2016-07-20 NOTE — Patient Instructions (Signed)
God to see you  Lets pad the elbow for now.  Do not lay it on anything hard for now.  Gabapentin 100mg  AT NIGHT  Lets see how this does Send me a message in 1 weeks and if not better lets get an EMG to see where it is coming from.

## 2016-07-20 NOTE — Progress Notes (Signed)
Subjective:  Patient ID: [name] [name], male    DOB: Oct 13, 1932  Age: 83 y.o. MRN: [id number]  CC: Annual Exam and Hyperlipidemia   HPI [name] [name] presents for an AWV/CPX.  He complains that his feet feel cold. This occurs mostly at night but somewhat during the day as well. He doesn't report any claudication but he has a history of atherosclerosis. He denies low back pain, numbness, weakness, tingling.  Past Medical History:  Diagnosis Date  . Anxiety   . CAD (coronary artery disease)    a. s/p stent to LAD 2008;  b. abnormal ETT 11/13 with VTach => LHC pLAD 30% ISR, mCFX 30%, dRCA 60-75% which had progressed from previous study in 2010 => FFR of RCA 0.92 => med Rx   . Carotid stenosis    dopplers 1/14:  0-39% bilateral ICA stenosis  . Colon polyps   . Colon polyps    adenomatous  . Diverticular disease of colon   . Hemorrhoids    external and internal  . History of BPH   . HTN (hypertension)   . Hx of echocardiogram    a. Echo 9/11:  EF 55-60%, normal motion, grade 2 diastolic dysfunction, mild MR, mild LAE, mild RAE, PASP 33.  Marland Kitchen Hyperlipidemia   . IBS (irritable bowel syndrome)   . Rhinitis   . Stroke, lacunar ([place]) 08/24/2012   Old, 2011 head mri   Past Surgical History:  Procedure Laterality Date  . APPENDECTOMY  1956  . CORONARY STENT PLACEMENT  2008    Successful PCI of the lesion in the proximal LAD using a Promus   . HERNIA REPAIR Right 2005  . NASAL SINUS SURGERY  1975  . PERCUTANEOUS CORONARY STENT INTERVENTION (PCI-S) N/A 05/03/2012   Procedure: PERCUTANEOUS CORONARY STENT INTERVENTION (PCI-S);  Surgeon: Sherren Mocha, MD;  Location: [facility] CATH LAB;  Service: Cardiovascular;  Laterality: N/A;  . Promus DES ok for 3T MRI  2008    reports that he has never smoked. He has never used smokeless tobacco. He reports that he drinks alcohol. He reports that he does not use drugs. family history includes Cancer in his brother; Coronary artery disease in his  father; Heart attack in his father; Rheum arthritis in his mother. No Known Allergies  Outpatient Medications Prior to Visit  Medication Sig Dispense Refill  . ALPRAZolam (XANAX) 0.25 MG tablet take 1/2 to 1 tablet by mouth at bedtime if needed for sleep 30 tablet 0  . aspirin 81 MG tablet Take 81 mg by mouth daily.      . Bisacodyl (DULCOLAX PO) Take 1 tablet by mouth daily as needed (for consitpation).     . clobetasol (TEMOVATE) 0.05 % external solution APPLY ON THE SCALP NIGHTLY AS DIRECTED  0  . cyclobenzaprine (FLEXERIL) 10 MG tablet Take 1 tablet (10 mg total) by mouth 3 (three) times daily as needed for muscle spasms. 30 tablet 0  . dutasteride (AVODART) 0.5 MG capsule Take 0.5 mg by mouth daily.    . fluticasone (FLONASE) 50 MCG/ACT nasal spray USE 2 SPRAYS IN EACH NOSTRIL DAILY 32 g 11  . gabapentin (NEURONTIN) 100 MG capsule Take 2 capsules (200 mg total) by mouth at bedtime. 60 capsule 3  . ipratropium (ATROVENT) 0.03 % nasal spray USE 2 SPRAYS INTO BOTH NOSTRILS EVERY 12 HOURS 90 mL 2  . lisinopril (PRINIVIL,ZESTRIL) 10 MG tablet Take 1 tablet (10 mg total) by mouth daily. 90 tablet 3  . meloxicam (  MOBIC) 15 MG tablet Take 1 tablet (15 mg total) by mouth daily. 30 tablet 0  . metoprolol succinate (TOPROL XL) 25 MG 24 hr tablet Take 1 tablet (25 mg total) by mouth daily. 90 tablet 3  . nitroGLYCERIN (NITROSTAT) 0.4 MG SL tablet Place 0.4 mg under the tongue every 5 (five) minutes as needed for chest pain (up to 3 doses only).    . pantoprazole (PROTONIX) 40 MG tablet Take 1 tablet (40 mg total) by mouth daily. 90 tablet 3  . Psyllium (MEDI-MUCIL PO) Take 240 g by mouth daily. Every morning     . rosuvastatin (CRESTOR) 40 MG tablet Take 1 tablet (40 mg total) by mouth daily. 30 tablet 11  . traMADol (ULTRAM) 50 MG tablet Take 50 mg by mouth every 12 (twelve) hours as needed (pain).    . Vitamin D, Ergocalciferol, (DRISDOL) 50000 units CAPS capsule Take 1 capsule (50,000 Units total)  by mouth every 7 (seven) days. 12 capsule 0  . zolpidem (AMBIEN) 10 MG tablet take 1 tablet by mouth at bedtime if needed for sleep 30 tablet 5  . diazepam (VALIUM) 5 MG tablet Take 5 mg by mouth every 6 (six) hours as needed for anxiety.     . hydrocortisone 2.5 % cream apply to FACE twice a day as directed  0  . hydrocortisone cream 0.5 % Apply 1 application topically 2 (two) times daily. Patient takes as needed    . triamcinolone cream (KENALOG) 0.1 % APPLY ON THE SKIN TWICE A DAY APPLY TO BACK AND LOWER LEGS TWICE A DAY FOR 2 TO 4 WEEKS AS DIRECTED  0  . predniSONE (DELTASONE) 50 MG tablet Take 1 tablet (50 mg total) by mouth daily. 5 tablet 0   No facility-administered medications prior to visit.     ROS Review of Systems  Constitutional: Negative.  Negative for activity change, appetite change, diaphoresis, fatigue and unexpected weight change.  HENT: Negative.   Eyes: Negative.   Respiratory: Negative for cough, chest tightness, shortness of breath and wheezing.   Cardiovascular: Negative for chest pain, palpitations and leg swelling.  Gastrointestinal: Negative for abdominal pain, blood in stool, constipation, diarrhea, nausea and vomiting.  Endocrine: Negative.   Genitourinary: Negative.  Negative for difficulty urinating, dysuria, frequency, testicular pain and urgency.  Musculoskeletal: Negative.  Negative for arthralgias, back pain, myalgias and neck pain.  Skin: Negative.  Negative for color change and rash.  Allergic/Immunologic: Negative.   Neurological: Negative.  Negative for dizziness, weakness and numbness.  Hematological: Negative.  Negative for adenopathy. Does not bruise/bleed easily.  Psychiatric/Behavioral: Negative.     Objective:  BP 136/70   Pulse 65   Temp 98.5 F (36.9 C) (Oral)   Resp 16   Ht 5\' 10"  (1.778 m)   Wt 148 lb 12 oz (67.5 kg)   SpO2 92%   BMI 21.34 kg/m   BP Readings from Last 3 Encounters:  07/20/16 138/76  07/20/16 136/70    07/06/16 138/64    Wt Readings from Last 3 Encounters:  07/20/16 150 lb (68 kg)  07/20/16 148 lb 12 oz (67.5 kg)  07/06/16 149 lb (67.6 kg)    Physical Exam  Constitutional: He is oriented to person, place, and time. He appears well-developed and well-nourished. No distress.  HENT:  Head: Normocephalic and atraumatic.  Mouth/Throat: Oropharynx is clear and moist. No oropharyngeal exudate.  Eyes: Conjunctivae are normal. Right eye exhibits no discharge. Left eye exhibits no discharge. No scleral  icterus.  Neck: Normal range of motion. Neck supple. No JVD present. No tracheal deviation present. No thyromegaly present.  Cardiovascular: Normal rate, regular rhythm, normal heart sounds and intact distal pulses.  Exam reveals no gallop and no friction rub.   No murmur heard. Pulses:      Carotid pulses are 1+ on the right side, and 1+ on the left side.      Radial pulses are 1+ on the right side, and 1+ on the left side.       Femoral pulses are 1+ on the right side, and 1+ on the left side.      Popliteal pulses are 0 on the right side, and 0 on the left side.       Dorsalis pedis pulses are 0 on the right side, and 0 on the left side.       Posterior tibial pulses are 0 on the right side, and 0 on the left side.  Pulmonary/Chest: Effort normal and breath sounds normal. No stridor. No respiratory distress. He has no wheezes. He has no rales. He exhibits no tenderness.  Abdominal: Soft. Bowel sounds are normal. He exhibits no distension and no mass. There is no tenderness. There is no rebound and no guarding. Hernia confirmed negative in the right inguinal area and confirmed negative in the left inguinal area.  Genitourinary: Rectum normal, testes normal and penis normal. Rectal exam shows no external hemorrhoid, no internal hemorrhoid, no fissure, no mass, no tenderness, anal tone normal and guaiac negative stool. Prostate is enlarged (1+ smooth symm BPH). Prostate is not tender. Right testis  shows no mass, no swelling and no tenderness. Right testis is descended. Left testis shows no mass, no swelling and no tenderness. Left testis is descended. Circumcised. No penile erythema or penile tenderness. No discharge found.  Musculoskeletal: Normal range of motion. He exhibits no edema, tenderness or deformity.  Lymphadenopathy:    He has no cervical adenopathy.       Right: No inguinal adenopathy present.       Left: No inguinal adenopathy present.  Neurological: He is oriented to person, place, and time.  Skin: Skin is warm and dry. No rash noted. He is not diaphoretic. No erythema. No pallor.  Psychiatric: He has a normal mood and affect. His behavior is normal. Judgment and thought content normal.  Vitals reviewed.   Lab Results  Component Value Date   WBC 5.2 07/20/2016   HGB 14.1 07/20/2016   HCT 41.5 07/20/2016   PLT 194.0 07/20/2016   GLUCOSE 93 07/20/2016   CHOL 124 07/20/2016   TRIG 113.0 07/20/2016   HDL 54.50 07/20/2016   LDLDIRECT 132.2 03/10/2007   LDLCALC 47 07/20/2016   ALT 17 07/20/2016   AST 19 07/20/2016   NA 142 07/20/2016   K 4.2 07/20/2016   CL 108 07/20/2016   CREATININE 1.03 07/20/2016   BUN 13 07/20/2016   CO2 27 07/20/2016   TSH 3.12 07/20/2016   PSA 0.44 05/10/2011   INR 1.0 04/27/2012    Dg Cervical Spine Complete  Result Date: 07/06/2016 CLINICAL DATA:  Right-sided neck pain EXAM: CERVICAL SPINE - COMPLETE 4+ VIEW COMPARISON:  None. FINDINGS: Mild anterior slip C2-3. Remaining alignment normal. Negative for fracture or mass. Prevertebral soft tissues normal. Mild right foraminal narrowing at C3-4 and C4-5 and C5-6 due to uncinate spurring. Mild left foraminal narrowing at C4-5 and C5-6 due to uncinate spurring. Mild carotid artery calcification. IMPRESSION: Disc degeneration and mild spondylosis  causing mild foraminal narrowing bilaterally. Electronically Signed   By: Franchot Gallo M.D.   On: 07/06/2016 16:49    Assessment & Plan:    Camryn was seen today for annual exam and hyperlipidemia.  Diagnoses and all orders for this visit:  Atherosclerosis of native coronary artery of native heart without angina pectoris- he said no recent episodes of chest pain, will continue risk factor modification with aspirin/statin/ACE inhibitor -     Lipid panel; Future  Essential hypertension- his blood pressure is well-controlled, electrolytes and renal function are normal. -     Comprehensive metabolic panel; Future -     CBC with Differential/Platelet; Future  Hyperlipidemia with target LDL less than 70- he has achieved his LDL goal is doing well on the statin. -     Lipid panel; Future -     Thyroid Panel With TSH; Future  Intermittent claudication ([place])- I've asked him to undergo an ABI to see how severe this is. -     VAS [place] ABI WITH/WO TBI; Future  Routine health maintenance   I have discontinued Mr. Avilla hydrocortisone cream, triamcinolone cream, hydrocortisone, diazepam, and predniSONE. I am also having him maintain his aspirin, Psyllium (MEDI-MUCIL PO), dutasteride, Bisacodyl (DULCOLAX PO), nitroGLYCERIN, pantoprazole, fluticasone, clobetasol, meloxicam, cyclobenzaprine, ipratropium, Vitamin D (Ergocalciferol), traMADol, rosuvastatin, lisinopril, metoprolol succinate, zolpidem, ALPRAZolam, and gabapentin.  No orders of the defined types were placed in this encounter.  See AVS for instructions about healthy living and anticipatory guidance.  Follow-up: Return in about 6 months (around 01/20/2017).  Scarlette Calico, MD

## 2016-07-20 NOTE — Progress Notes (Signed)
CC: Right shoulder pain f/u  HPI: Patient is here for right shoulder pain. Has had a contralateral sign acromion clavicular arthritis previously. Patient filling this is somewhat different. States was more of a burning sensation. There is concern for more of a cervical radiculopathy. Patient did have x-rays. X-rays of cervical spine were independently visualized by me showing right-sided foraminal narrowing C3-C6. Patient wanted to try conservative therapy and was started on gabapentin. Patient states was unable to tolerate the gabapentin isn't made him feel too tired. Patient states that he continues to have pain and seems to be more when he puts pressure on the elbow within more of his neck at the moment. States that been shoulder seems to be somewhat better though.  Past Medical History:  Diagnosis Date  . Anxiety   . CAD (coronary artery disease)    a. s/p stent to LAD 2008;  b. abnormal ETT 11/13 with VTach => LHC pLAD 30% ISR, mCFX 30%, dRCA 60-75% which had progressed from previous study in 2010 => FFR of RCA 0.92 => med Rx   . Carotid stenosis    dopplers 1/14:  0-39% bilateral ICA stenosis  . Colon polyps   . Colon polyps    adenomatous  . Diverticular disease of colon   . Hemorrhoids    external and internal  . History of BPH   . HTN (hypertension)   . Hx of echocardiogram    a. Echo 9/11:  EF 55-60%, normal motion, grade 2 diastolic dysfunction, mild MR, mild LAE, mild RAE, PASP 33.  Marland Kitchen Hyperlipidemia   . IBS (irritable bowel syndrome)   . Rhinitis   . Stroke, lacunar ([place]) 08/24/2012   Old, 2011 head mri   Past Surgical History:  Procedure Laterality Date  . APPENDECTOMY  1956  . CORONARY STENT PLACEMENT  2008    Successful PCI of the lesion in the proximal LAD using a Promus   . HERNIA REPAIR Right 2005  . NASAL SINUS SURGERY  1975  . PERCUTANEOUS CORONARY STENT INTERVENTION (PCI-S) N/A 05/03/2012   Procedure: PERCUTANEOUS CORONARY STENT INTERVENTION (PCI-S);  Surgeon:  Sherren Mocha, MD;  Location: [facility] CATH LAB;  Service: Cardiovascular;  Laterality: N/A;  . Promus DES ok for 3T MRI  2008   Social History  Substance Use Topics  . Smoking status: Never Smoker  . Smokeless tobacco: Never Used  . Alcohol use Yes     Comment: 4-6 drinks/week   No Known Allergies no known drug allergies Family History  Problem Relation Age of Onset  . Coronary artery disease Father   . Heart attack Father   . Rheum arthritis Mother   . Cancer Brother     ? TYPE  . Colon cancer Neg Hx    .  Past medical, surgical, family and social history reviewed. Medications reviewed all in the electronic medical record.   Review of Systems: No headache, visual changes, nausea, vomiting, diarrhea, constipation, dizziness, abdominal pain, skin rash, fevers, chills, night sweats, weight loss, swollen lymph nodes, body aches, joint swelling, muscle aches, chest pain, shortness of breath, mood changes.   .   Objective:    Blood pressure 138/76, height 5\' 10"  (1.778 m), weight 150 lb (68 kg).   Systems examined below as of 07/20/16 General: NAD A&O x3 mood, affect normal  HEENT: Pupils equal, extraocular movements intact no nystagmus Respiratory: not short of breath at rest or with speaking Cardiovascular: No lower extremity edema, non tender Skin: Warm dry intact  with no signs of infection or rash on extremities or on axial skeleton. Abdomen: Soft nontender, no masses Neuro: Cranial nerves  intact, neurovascularly intact in all extremities with 2+ DTRs and 2+ pulses. Lymph: No lymphadenopathy appreciated today  Gait normal with good balance and coordination.  MSK: Non tender with full range of motion and good stability and symmetric strength and tone of , wrist,  knee hips and ankles bilaterally.   Shoulder: Right Inspection reveals no abnormalities, atrophy or asymmetry. Palpation is normal with no tenderness over AC joint or bicipital groove. ROM is full in all  planes. Rotator cuff strength normal throughout. Negative impingement Speeds and Yergason's tests normal. No labral pathology noted with negative Obrien's, negative clunk and good stability. Normal scapular function observed. No painful arc and no drop arm sign. No apprehension sign Contralateral shoulder shows some positive crossover with still some mild tenderness over the acromial clavicular joint  Elbow: Right Unremarkable to inspection. Range of motion full pronation, supination, flexion, extension. Strength is full to all of the above directions Stable to varus, valgus stress. Negative moving valgus stress test. Discomfort noted over the medial epicondylar region. Ulnar nerve does not sublux. Negative cubital tunnel Tinel's.   Neck: Inspection unremarkable. No palpable stepoffs. Negative Spurling's maneuver. Still lacks last 10 of extension Grip strength and sensation normal in bilateral hands Strength good C4 to T1 distribution No sensory change to C4 to T1 Negative Hoffman sign bilaterally Reflexes normal  Impression and Recommendations:     This case required medical decision making of moderate complexity.

## 2016-07-20 NOTE — Assessment & Plan Note (Signed)
I still believe the patient is having some cervical radiculitis. Patient has degenerative changes of the neck that does correspond with his symptoms. Patient is more concerned that he feels that this compression more at the elbow now with her shoulder hurting less. Discussed with patient at great length. Patient will try one week with pain less pressure in the area. We discussed topical anti-inflammatories and padding. Patient knows if any worsening symptoms she is post:. In 1 week if no significant improvement I would like patient to do an EMG for further evaluation and where the nerve potentially could be having difficult he's. Patient could be a candidate for epidurals if this continues. Patient knows to call sooner if any weakness occurs. Spent  25 minutes with patient face-to-face and had greater than 50% of counseling including as described above in assessment and plan.

## 2016-07-20 NOTE — Patient Instructions (Signed)
 Health Maintenance, Male A healthy lifestyle and preventive care is important for your health and wellness. Ask your health care provider about what schedule of regular examinations is right for you. What should I know about weight and diet?  Eat a Healthy Diet  Eat plenty of vegetables, fruits, whole grains, low-fat dairy products, and lean protein.  Do not eat a lot of foods high in solid fats, added sugars, or salt. Maintain a Healthy Weight  Regular exercise can help you achieve or maintain a healthy weight. You should:  Do at least 150 minutes of exercise each week. The exercise should increase your heart rate and make you sweat (moderate-intensity exercise).  Do strength-training exercises at least twice a week. Watch Your Levels of Cholesterol and Blood Lipids  Have your blood tested for lipids and cholesterol every 5 years starting at 81 years of age. If you are at high risk for heart disease, you should start having your blood tested when you are 81 years old. You may need to have your cholesterol levels checked more often if:  Your lipid or cholesterol levels are high.  You are older than 81 years of age.  You are at high risk for heart disease. What should I know about cancer screening? Many types of cancers can be detected early and may often be prevented. Lung Cancer  You should be screened every year for lung cancer if:  You are a current smoker who has smoked for at least 30 years.  You are a former smoker who has quit within the past 15 years.  Talk to your health care provider about your screening options, when you should start screening, and how often you should be screened. Colorectal Cancer  Routine colorectal cancer screening usually begins at 81 years of age and should be repeated every 5-10 years until you are 81 years old. You may need to be screened more often if early forms of precancerous polyps or small growths are found. Your health care provider  may recommend screening at an earlier age if you have risk factors for colon cancer.  Your health care provider may recommend using home test kits to check for hidden blood in the stool.  A small camera at the end of a tube can be used to examine your colon (sigmoidoscopy or colonoscopy). This checks for the earliest forms of colorectal cancer. Prostate and Testicular Cancer  Depending on your age and overall health, your health care provider may do certain tests to screen for prostate and testicular cancer.  Talk to your health care provider about any symptoms or concerns you have about testicular or prostate cancer. Skin Cancer  Check your skin from head to toe regularly.  Tell your health care provider about any new moles or changes in moles, especially if:  There is a change in a mole's size, shape, or color.  You have a mole that is larger than a pencil eraser.  Always use sunscreen. Apply sunscreen liberally and repeat throughout the day.  Protect yourself by wearing long sleeves, pants, a wide-brimmed hat, and sunglasses when outside. What should I know about heart disease, diabetes, and high blood pressure?  If you are 18-39 years of age, have your blood pressure checked every 3-5 years. If you are 40 years of age or older, have your blood pressure checked every year. You should have your blood pressure measured twice-once when you are at a hospital or clinic, and once when you are not at   a hospital or clinic. Record the average of the two measurements. To check your blood pressure when you are not at a hospital or clinic, you can use:  An automated blood pressure machine at a pharmacy.  A home blood pressure monitor.  Talk to your health care provider about your target blood pressure.  If you are between 45-79 years old, ask your health care provider if you should take aspirin to prevent heart disease.  Have regular diabetes screenings by checking your fasting blood sugar  level.  If you are at a normal weight and have a low risk for diabetes, have this test once every three years after the age of 45.  If you are overweight and have a high risk for diabetes, consider being tested at a younger age or more often.  A one-time screening for abdominal aortic aneurysm (AAA) by ultrasound is recommended for men aged 65-75 years who are current or former smokers. What should I know about preventing infection? Hepatitis B  If you have a higher risk for hepatitis B, you should be screened for this virus. Talk with your health care provider to find out if you are at risk for hepatitis B infection. Hepatitis C  Blood testing is recommended for:  Everyone born from 1945 through 1965.  Anyone with known risk factors for hepatitis C. Sexually Transmitted Diseases (STDs)  You should be screened each year for STDs including gonorrhea and chlamydia if:  You are sexually active and are younger than 81 years of age.  You are older than 81 years of age and your health care provider tells you that you are at risk for this type of infection.  Your sexual activity has changed since you were last screened and you are at an increased risk for chlamydia or gonorrhea. Ask your health care provider if you are at risk.  Talk with your health care provider about whether you are at high risk of being infected with HIV. Your health care provider may recommend a prescription medicine to help prevent HIV infection. What else can I do?  Schedule regular health, dental, and eye exams.  Stay current with your vaccines (immunizations).  Do not use any tobacco products, such as cigarettes, chewing tobacco, and e-cigarettes. If you need help quitting, ask your health care provider.  Limit alcohol intake to no more than 2 drinks per day. One drink equals 12 ounces of beer, 5 ounces of wine, or 1 ounces of hard liquor.  Do not use street drugs.  Do not share needles.  Ask your health  care provider for help if you need support or information about quitting drugs.  Tell your health care provider if you often feel depressed.  Tell your health care provider if you have ever been abused or do not feel safe at home. This information is not intended to replace advice given to you by your health care provider. Make sure you discuss any questions you have with your health care provider. Document Released: 10/30/2007 Document Revised: 12/31/2015 Document Reviewed: 02/04/2015 Elsevier Interactive Patient Education  2017 [employer]  

## 2016-07-25 NOTE — Assessment & Plan Note (Signed)

## 2016-07-26 ENCOUNTER — Encounter: Payer: Self-pay | Admitting: Family Medicine

## 2016-08-02 DIAGNOSIS — H01115 Allergic dermatitis of left lower eyelid: Secondary | ICD-10-CM | POA: Diagnosis not present

## 2016-08-02 DIAGNOSIS — H0015 Chalazion left lower eyelid: Secondary | ICD-10-CM | POA: Diagnosis not present

## 2016-08-04 DIAGNOSIS — H0015 Chalazion left lower eyelid: Secondary | ICD-10-CM | POA: Diagnosis not present

## 2016-08-16 ENCOUNTER — Ambulatory Visit (HOSPITAL_COMMUNITY)
Admission: RE | Admit: 2016-08-16 | Discharge: 2016-08-16 | Disposition: A | Discharge status: Home / Self Care | Payer: Medicare Other | Source: Ambulatory Visit | Attending: Surgery | Admitting: Surgery

## 2016-08-16 DIAGNOSIS — I739 Peripheral vascular disease, unspecified: Secondary | ICD-10-CM | POA: Insufficient documentation

## 2016-08-18 DIAGNOSIS — R3915 Urgency of urination: Secondary | ICD-10-CM | POA: Diagnosis not present

## 2016-08-18 DIAGNOSIS — R351 Nocturia: Secondary | ICD-10-CM | POA: Diagnosis not present

## 2016-08-18 NOTE — Progress Notes (Signed)
Patient ID: [name] [name], male   DOB: [birth year], 83 y.o.   MRN: [id number]   He has a hx of CAD, s/p stent to the LAD in 2008, HTN, HL, IBS, anxiety. Echo 9/11: EF 55-60%, normal motion, grade 2 diastolic dysfunction, mild MR, mild LAE, mild RAE, PASP 33. He saw me  in 03/2012 and noted throat pain intermittently. He was set up for an ETT. ETT 04/27/12: Abnormal with episodes of ventricular tachycardia-longest 18 beats. He was set up for cardiac catheterization. LHC 05/01/12: pLAD 30% ISR, mCFX 30%, dRCA 60-75% which had progressed from previous study in 2010. Patient was loaded with Plavix and brought back 05/03/12  for pressure wire analysis. FFR at peak hyperemia was 0.92 which was felt to be hemodynamically insignificant. Continued medical therapy was recommended. He was placed on Toprol and his Plavix was stopped.   Doing well since being on Toprol mild exertional dyspnea and occasional chest pressure when walking [place] beach    Continues to have episodes of transient visual loss.  ? Occular migraines Been going on for over 3-4 years  Recently daughter who is a PA indicated pulse was irregular With episode.  Lasted 3-5 minutes.  Denies associated headache , speech difficulty, or other focal neuro signs Has seen [facility] before  MRI reviewed 11/2013 and  Mild small vessel disease  F/U MRI/MRA reviewed and no abnormalities.  Seen by Dr Rayann Heman for ILR He wanted patient seen by neurology first  Seen by Dr Tomi Likens 2/23. F/U EEG was normal with no epileptic foci  Was kind enough to bring me a bottle of [place] today Daughter was in a life threatening car accident in [place] and has finally recovered after 3 weeks   01/11/14  1-39% bilateral carotid disease  04/23/14 Myovue Reviewed  Overall Impression: Low risk stress nuclear study with mild basal septal as well as basal inferolateral defect seen at both rest and stress with associated minimal ischemia.   LV Ejection Fraction: 54%. 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  All other systems reviewed and negative  General: Affect appropriate Healthy:  appears stated age 78: normal Neck supple with no adenopathy JVP normal no bruits no thyromegaly Lungs clear with no wheezing and good diaphragmatic motion Heart:  S1/S2 no murmur, no rub, gallop or click PMI normal Abdomen: benighn, BS positve, no tenderness, no AAA no bruit.  No HSM or HJR Distal pulses intact with no bruits No edema Neuro non-focal Skin warm and dry No muscular weakness   Current Outpatient Prescriptions  Medication Sig Dispense Refill  . ALPRAZolam (XANAX) 0.25 MG tablet take 1/2 to 1 tablet by mouth at bedtime if needed for sleep 30 tablet 0  . aspirin 81 MG tablet Take 81 mg by mouth daily.      . Bisacodyl (DULCOLAX PO) Take 1 tablet by mouth daily as needed (for consitpation).     . clobetasol (TEMOVATE) 0.05 % external solution APPLY ON THE SCALP NIGHTLY AS DIRECTED  0  . cyclobenzaprine (  FLEXERIL) 10 MG tablet Take 1 tablet (10 mg total) by mouth 3 (three) times daily as needed for muscle spasms. 30 tablet 0  . dutasteride (AVODART) 0.5 MG capsule Take 0.5 mg by mouth daily.    . fluticasone (FLONASE) 50 MCG/ACT nasal spray USE 2 SPRAYS IN EACH NOSTRIL DAILY 32 g 11  . gabapentin (NEURONTIN) 100 MG capsule Take 2 capsules (200 mg total) by mouth at bedtime. 60 capsule 3  . ipratropium (ATROVENT) 0.03 % nasal spray USE 2 SPRAYS INTO BOTH  NOSTRILS EVERY 12 HOURS 90 mL 2  . lisinopril (PRINIVIL,ZESTRIL) 10 MG tablet Take 1 tablet (10 mg total) by mouth daily. 90 tablet 3  . meloxicam (MOBIC) 15 MG tablet Take 1 tablet (15 mg total) by mouth daily. 30 tablet 0  . metoprolol succinate (TOPROL XL) 25 MG 24 hr tablet Take 1 tablet (25 mg total) by mouth daily. 90 tablet 3  . nitroGLYCERIN (NITROSTAT) 0.4 MG SL tablet Place 0.4 mg under the tongue every 5 (five) minutes as needed for chest pain (up to 3 doses only).    . pantoprazole (PROTONIX) 40 MG tablet Take 1 tablet (40 mg total) by mouth daily. 90 tablet 3  . Psyllium (MEDI-MUCIL PO) Take 240 g by mouth daily. Every morning     . rosuvastatin (CRESTOR) 40 MG tablet Take 1 tablet (40 mg total) by mouth daily. 14 tablet 0  . traMADol (ULTRAM) 50 MG tablet Take 50 mg by mouth every 12 (twelve) hours as needed (pain).    . Vitamin D, Ergocalciferol, (DRISDOL) 50000 units CAPS capsule Take 1 capsule (50,000 Units total) by mouth every 7 (seven) days. 12 capsule 0  . zolpidem (AMBIEN) 10 MG tablet take 1 tablet by mouth at bedtime if needed for sleep 30 tablet 5   No current facility-administered medications for this visit.     Allergies  Patient has no known allergies.  Electrocardiogram:  04/18/14  SR rate 73  ICRBBB old IMI possible nonspecific ST T wave changes  no change from 2014  06/10/15  SR rate 31 PAC/PVC ICRBBB old IMI No afib.   02/18/16  SR ICRBBB old IMI   Assessment and Plan CAD: Stable with no angina and good activity level.  Continue medical Rx Stent LAD 2008  Moderate distal RCA with normal flow wire 04/2012  Non ischemic myovue 04/24/14 repeat this september Chol: Lab Results  Component Value Date   LDLCALC 47 07/20/2016    GERD:  Continue protonix   Insmonia:  Chronic on ambien f/u Dr Ronnald Ramp  Visual Disturbance:  MRI/MRA normal EEG normal has been seen by neurology No recurrence will wait until he has more  symptoms to consider referral  for ILR    F/U with  me in 6 months   [name]

## 2016-08-20 ENCOUNTER — Encounter: Payer: Self-pay | Admitting: Cardiovascular Disease

## 2016-08-20 ENCOUNTER — Ambulatory Visit (INDEPENDENT_AMBULATORY_CARE_PROVIDER_SITE_OTHER): Payer: Medicare Other | Admitting: Cardiovascular Disease

## 2016-08-20 VITALS — BP 116/60 | HR 75 | Ht 70.0 in | Wt 151.0 lb

## 2016-08-20 DIAGNOSIS — I2510 Atherosclerotic heart disease of native coronary artery without angina pectoris: Secondary | ICD-10-CM

## 2016-08-20 DIAGNOSIS — I10 Essential (primary) hypertension: Secondary | ICD-10-CM | POA: Diagnosis not present

## 2016-08-20 DIAGNOSIS — I472 Ventricular tachycardia: Secondary | ICD-10-CM | POA: Diagnosis not present

## 2016-08-20 DIAGNOSIS — I739 Peripheral vascular disease, unspecified: Secondary | ICD-10-CM | POA: Diagnosis not present

## 2016-08-20 DIAGNOSIS — I4729 Other ventricular tachycardia: Secondary | ICD-10-CM

## 2016-08-20 MED ORDER — ROSUVASTATIN CALCIUM 40 MG PO TABS
40.0000 mg | ORAL_TABLET | Freq: Every day | ORAL | 1 refills | Status: DC
Start: 2016-08-20 — End: 2016-08-20

## 2016-08-20 MED ORDER — ROSUVASTATIN CALCIUM 40 MG PO TABS: 40.0000 mg | ORAL_TABLET | Freq: Every day | ORAL | 0 refills | Status: DC

## 2016-08-20 NOTE — Patient Instructions (Addendum)
Medication Instructions:  Your physician recommends that you continue on your current medications as directed. Please refer to the Current Medication list given to you today.  Labwork: NONE  Testing/Procedures: Your physician has requested that you have en exercise stress myoview in December. For further information please visit [email or web address]. Please follow instruction sheet, as given.  Follow-Up: Your physician wants you to follow-up in: December with Dr. Johnsie Cancel. You will receive a reminder letter in the mail two months in advance. If you don't receive a letter, please call our office to schedule the follow-up appointment.   If you need a refill on your cardiac medications before your next appointment, please call your pharmacy.

## 2016-08-23 ENCOUNTER — Encounter (HOSPITAL_COMMUNITY): Payer: Medicare Other

## 2016-08-26 DIAGNOSIS — Z85820 Personal history of malignant melanoma of skin: Secondary | ICD-10-CM | POA: Diagnosis not present

## 2016-08-26 DIAGNOSIS — L814 Other melanin hyperpigmentation: Secondary | ICD-10-CM | POA: Diagnosis not present

## 2016-08-26 DIAGNOSIS — L821 Other seborrheic keratosis: Secondary | ICD-10-CM | POA: Diagnosis not present

## 2016-08-26 DIAGNOSIS — D1801 Hemangioma of skin and subcutaneous tissue: Secondary | ICD-10-CM | POA: Diagnosis not present

## 2016-08-26 DIAGNOSIS — Z85828 Personal history of other malignant neoplasm of skin: Secondary | ICD-10-CM | POA: Diagnosis not present

## 2016-08-26 DIAGNOSIS — L570 Actinic keratosis: Secondary | ICD-10-CM | POA: Diagnosis not present

## 2016-08-26 DIAGNOSIS — D692 Other nonthrombocytopenic purpura: Secondary | ICD-10-CM | POA: Diagnosis not present

## 2016-08-27 DIAGNOSIS — H0015 Chalazion left lower eyelid: Secondary | ICD-10-CM | POA: Diagnosis not present

## 2016-09-06 DIAGNOSIS — H0015 Chalazion left lower eyelid: Secondary | ICD-10-CM | POA: Diagnosis not present

## 2016-09-20 DIAGNOSIS — H52203 Unspecified astigmatism, bilateral: Secondary | ICD-10-CM | POA: Diagnosis not present

## 2016-09-20 DIAGNOSIS — H2513 Age-related nuclear cataract, bilateral: Secondary | ICD-10-CM | POA: Diagnosis not present

## 2016-09-20 DIAGNOSIS — H5000 Unspecified esotropia: Secondary | ICD-10-CM | POA: Diagnosis not present

## 2016-09-20 DIAGNOSIS — H0015 Chalazion left lower eyelid: Secondary | ICD-10-CM | POA: Diagnosis not present

## 2016-10-03 ENCOUNTER — Other Ambulatory Visit: Payer: Self-pay | Admitting: Internal Medicine

## 2016-10-12 DIAGNOSIS — M79641 Pain in right hand: Secondary | ICD-10-CM | POA: Diagnosis not present

## 2016-10-12 DIAGNOSIS — G5601 Carpal tunnel syndrome, right upper limb: Secondary | ICD-10-CM | POA: Diagnosis not present

## 2016-10-21 ENCOUNTER — Ambulatory Visit: Payer: Medicare Other | Admitting: Family Medicine

## 2016-10-28 ENCOUNTER — Ambulatory Visit: Payer: Medicare Other | Admitting: Family Medicine

## 2016-10-28 DIAGNOSIS — C44729 Squamous cell carcinoma of skin of left lower limb, including hip: Secondary | ICD-10-CM | POA: Diagnosis not present

## 2016-10-28 DIAGNOSIS — D485 Neoplasm of uncertain behavior of skin: Secondary | ICD-10-CM | POA: Diagnosis not present

## 2016-10-28 DIAGNOSIS — Z85828 Personal history of other malignant neoplasm of skin: Secondary | ICD-10-CM | POA: Diagnosis not present

## 2016-10-28 DIAGNOSIS — Z85820 Personal history of malignant melanoma of skin: Secondary | ICD-10-CM | POA: Diagnosis not present

## 2016-12-27 ENCOUNTER — Encounter: Payer: Self-pay | Admitting: Internal Medicine

## 2016-12-27 ENCOUNTER — Ambulatory Visit (INDEPENDENT_AMBULATORY_CARE_PROVIDER_SITE_OTHER): Payer: Medicare Other | Admitting: Internal Medicine

## 2016-12-27 ENCOUNTER — Other Ambulatory Visit (INDEPENDENT_AMBULATORY_CARE_PROVIDER_SITE_OTHER): Payer: Medicare Other

## 2016-12-27 VITALS — BP 110/54 | HR 84 | Temp 98.1°F | Resp 16 | Ht 70.0 in | Wt 147.5 lb

## 2016-12-27 DIAGNOSIS — R197 Diarrhea, unspecified: Secondary | ICD-10-CM

## 2016-12-27 DIAGNOSIS — I2510 Atherosclerotic heart disease of native coronary artery without angina pectoris: Secondary | ICD-10-CM | POA: Diagnosis not present

## 2016-12-27 DIAGNOSIS — K8681 Exocrine pancreatic insufficiency: Secondary | ICD-10-CM | POA: Diagnosis not present

## 2016-12-27 DIAGNOSIS — R1013 Epigastric pain: Secondary | ICD-10-CM

## 2016-12-27 DIAGNOSIS — R10816 Epigastric abdominal tenderness: Secondary | ICD-10-CM

## 2016-12-27 LAB — CBC WITH DIFFERENTIAL/PLATELET
BASOS PCT: 0.7 % (ref 0.0–3.0)
Basophils Absolute: 0 10*3/uL (ref 0.0–0.1)
EOS ABS: 1.6 10*3/uL — AB (ref 0.0–0.7)
Eosinophils Relative: 21.2 % — ABNORMAL HIGH (ref 0.0–5.0)
HEMATOCRIT: 41 % (ref 39.0–52.0)
HEMOGLOBIN: 13.9 g/dL (ref 13.0–17.0)
LYMPHS PCT: 20.2 % (ref 12.0–46.0)
Lymphs Abs: 1.5 10*3/uL (ref 0.7–4.0)
MCHC: 34 g/dL (ref 30.0–36.0)
MCV: 91.3 fl (ref 78.0–100.0)
Monocytes Absolute: 0.6 10*3/uL (ref 0.1–1.0)
Monocytes Relative: 8.2 % (ref 3.0–12.0)
Neutro Abs: 3.7 10*3/uL (ref 1.4–7.7)
Neutrophils Relative %: 49.7 % (ref 43.0–77.0)
Platelets: 186 10*3/uL (ref 150.0–400.0)
RBC: 4.49 Mil/uL (ref 4.22–5.81)
RDW: 13.8 % (ref 11.5–15.5)
WBC: 7.5 10*3/uL (ref 4.0–10.5)

## 2016-12-27 LAB — COMPREHENSIVE METABOLIC PANEL
ALBUMIN: 3.8 g/dL (ref 3.5–5.2)
ALT: 25 U/L (ref 0–53)
AST: 21 U/L (ref 0–37)
Alkaline Phosphatase: 51 U/L (ref 39–117)
BUN: 14 mg/dL (ref 6–23)
CALCIUM: 8.9 mg/dL (ref 8.4–10.5)
CHLORIDE: 108 meq/L (ref 96–112)
CO2: 25 mEq/L (ref 19–32)
CREATININE: 0.98 mg/dL (ref 0.40–1.50)
GFR: 77.9 mL/min (ref >60.00)
Glucose, Bld: 102 mg/dL — ABNORMAL HIGH (ref 70–99)
POTASSIUM: 3.8 meq/L (ref 3.5–5.1)
Sodium: 141 mEq/L (ref 135–145)
Total Bilirubin: 0.4 mg/dL (ref 0.2–1.2)
Total Protein: 5.6 g/dL — ABNORMAL LOW (ref 6.0–8.3)

## 2016-12-27 LAB — AMYLASE: AMYLASE: 46 U/L (ref 27–131)

## 2016-12-27 LAB — LIPASE: Lipase: 34 U/L (ref 11.0–59.0)

## 2016-12-27 MED ORDER — PANCRELIPASE (LIP-PROT-AMYL) 40000-126000 UNITS PO CPEP: 1.0000 | ORAL_CAPSULE | Freq: Three times a day (TID) | ORAL | 1 refills | Status: DC

## 2016-12-27 NOTE — Progress Notes (Signed)
Subjective:  Patient ID: [name] [name], male    DOB: [birth year]  Age: 83 y.o. MRN: [id number]  CC: Abdominal Pain   HPI YAZID POP presents for a 2 month hx of epigastric abdominal pain that is most prominent in the morning. He has intermittent diarrhea and he complains that his stools are floating. He also has a decreased appetite and has lost some weight. He also complains of belching but denies odynophagia or dysphagia. He takes Tylenol for pain and rarely drinks alcohol.  Outpatient Medications Prior to Visit  Medication Sig Dispense Refill  . ALPRAZolam (XANAX) 0.25 MG tablet take 1/2 to 1 tablet by mouth at bedtime if needed for sleep 30 tablet 0  . aspirin 81 MG tablet Take 81 mg by mouth daily.      . Bisacodyl (DULCOLAX PO) Take 1 tablet by mouth daily as needed (for consitpation).     . clobetasol (TEMOVATE) 0.05 % external solution APPLY ON THE SCALP NIGHTLY AS DIRECTED  0  . cyclobenzaprine (FLEXERIL) 10 MG tablet Take 1 tablet (10 mg total) by mouth 3 (three) times daily as needed for muscle spasms. 30 tablet 0  . dutasteride (AVODART) 0.5 MG capsule Take 0.5 mg by mouth daily.    . fluticasone (FLONASE) 50 MCG/ACT nasal spray USE 2 SPRAYS IN EACH NOSTRIL DAILY 32 g 11  . gabapentin (NEURONTIN) 100 MG capsule Take 2 capsules (200 mg total) by mouth at bedtime. 60 capsule 3  . ipratropium (ATROVENT) 0.03 % nasal spray USE 2 SPRAYS INTO BOTH NOSTRILS EVERY 12 HOURS 90 mL 2  . lisinopril (PRINIVIL,ZESTRIL) 10 MG tablet Take 1 tablet (10 mg total) by mouth daily. 90 tablet 3  . meloxicam (MOBIC) 15 MG tablet Take 1 tablet (15 mg total) by mouth daily. 30 tablet 0  . metoprolol succinate (TOPROL XL) 25 MG 24 hr tablet Take 1 tablet (25 mg total) by mouth daily. 90 tablet 3  . nitroGLYCERIN (NITROSTAT) 0.4 MG SL tablet Place 0.4 mg under the tongue every 5 (five) minutes as needed for chest pain (up to 3 doses only).    . pantoprazole (PROTONIX) 40 MG tablet Take 1 tablet (40  mg total) by mouth daily. 90 tablet 3  . Psyllium (MEDI-MUCIL PO) Take 240 g by mouth daily. Every morning     . rosuvastatin (CRESTOR) 40 MG tablet Take 1 tablet (40 mg total) by mouth daily. 14 tablet 0  . traMADol (ULTRAM) 50 MG tablet Take 50 mg by mouth every 12 (twelve) hours as needed (pain).    . Vitamin D, Ergocalciferol, (DRISDOL) 50000 units CAPS capsule Take 1 capsule (50,000 Units total) by mouth every 7 (seven) days. 12 capsule 0  . zolpidem (AMBIEN) 10 MG tablet take 1 tablet by mouth at bedtime if needed for sleep 30 tablet 5   No facility-administered medications prior to visit.     ROS Review of Systems  Constitutional: Positive for unexpected weight change. Negative for diaphoresis and fatigue.  HENT: Negative.  Negative for sinus pressure, trouble swallowing and voice change.   Eyes: Negative for visual disturbance.  Respiratory: Negative for cough, chest tightness, shortness of breath and wheezing.   Cardiovascular: Negative for chest pain, palpitations and leg swelling.  Gastrointestinal: Positive for abdominal pain and diarrhea. Negative for constipation, nausea and vomiting.  Endocrine: Negative.   Genitourinary: Negative.  Negative for difficulty urinating, dysuria, flank pain, frequency, hematuria, scrotal swelling, testicular pain and urgency.  Musculoskeletal: Negative.  Skin: Negative.   Allergic/Immunologic: Negative.   Neurological: Negative.  Negative for dizziness and weakness.  Hematological: Negative for adenopathy. Does not bruise/bleed easily.  Psychiatric/Behavioral: Negative.  Negative for decreased concentration, dysphoric mood and sleep disturbance. The patient is not nervous/anxious.     Objective:  BP (!) 110/54 (BP Location: Left Arm, Patient Position: Sitting, Cuff Size: Normal)   Pulse 84   Temp 98.1 F (36.7 C) (Oral)   Resp 16   Ht 5\' 10"  (1.778 m)   Wt 147 lb 8 oz (66.9 kg)   SpO2 98%   BMI 21.16 kg/m   BP Readings from Last 3  Encounters:  12/27/16 (!) 110/54  08/20/16 116/60  07/20/16 138/76    Wt Readings from Last 3 Encounters:  12/27/16 147 lb 8 oz (66.9 kg)  08/20/16 151 lb (68.5 kg)  07/20/16 150 lb (68 kg)    Physical Exam  Constitutional: He is oriented to person, place, and time.  Non-toxic appearance. He does not have a sickly appearance. He does not appear ill. No distress.  HENT:  Mouth/Throat: Oropharynx is clear and moist. No oropharyngeal exudate.  Eyes: Conjunctivae are normal. Right eye exhibits no discharge. No scleral icterus.  Neck: Normal range of motion. Neck supple. No JVD present. No thyromegaly present.  Cardiovascular: Normal rate, regular rhythm and intact distal pulses.  Exam reveals no gallop and no friction rub.   No murmur heard. Pulmonary/Chest: Effort normal and breath sounds normal. No respiratory distress. He has no wheezes. He has no rales. He exhibits no tenderness.  Abdominal: Soft. Normal appearance and bowel sounds are normal. He exhibits no distension and no mass. There is no hepatosplenomegaly, splenomegaly or hepatomegaly. There is tenderness in the epigastric area. There is no rebound, no guarding and no CVA tenderness. No hernia. Hernia confirmed negative in the ventral area.  Musculoskeletal: Normal range of motion. He exhibits no edema or tenderness.  Lymphadenopathy:    He has no cervical adenopathy.  Neurological: He is alert and oriented to person, place, and time.  Skin: Skin is warm and dry. No rash noted. He is not diaphoretic. No erythema. No pallor.  Vitals reviewed.   Lab Results  Component Value Date   WBC 7.5 12/27/2016   HGB 13.9 12/27/2016   HCT 41.0 12/27/2016   PLT 186.0 12/27/2016   GLUCOSE 102 (H) 12/27/2016   CHOL 124 07/20/2016   TRIG 113.0 07/20/2016   HDL 54.50 07/20/2016   LDLDIRECT 132.2 03/10/2007   LDLCALC 47 07/20/2016   ALT 25 12/27/2016   AST 21 12/27/2016   NA 141 12/27/2016   K 3.8 12/27/2016   CL 108 12/27/2016    CREATININE 0.98 12/27/2016   BUN 14 12/27/2016   CO2 25 12/27/2016   TSH 3.12 07/20/2016   PSA 0.44 05/10/2011   INR 1.0 04/27/2012    No results found.  Assessment & Plan:   Alix was seen today for abdominal pain.  Diagnoses and all orders for this visit:  Epigastric abdominal tenderness without rebound tenderness- he has tenderness on examination but no rebound and no concern for an acute abdominal process. His lab work is negative for any evidence of organic pathology. I've asked him to undergo a CT with contrast to screen for malignancy, mass, peptic ulcer disease, aneurysm, or kidney stone. -     Comprehensive metabolic panel; Future -     CBC with Differential/Platelet; Future -     Lipase; Future -  Amylase; Future -     CT Abdomen Pelvis W Contrast; Future  Diarrhea, unspecified type- his symptoms are very suspicious for exocrine pancreatic insufficiency. His labs are negative for any evidence of infection, malabsorption, or secondary causes. -     CBC with Differential/Platelet; Future -     Pancreatic elastase, fecal; Future  Exocrine pancreatic insufficiency -     Pancreatic elastase, fecal; Future -     Pancrelipase, Lip-Prot-Amyl, (ZENPEP) 40000-126000 units CPEP; Take 1 capsule by mouth 4 (four) times daily -  before meals and at bedtime.  Epigastric pain   I am having Mr. Koskela start on Pancrelipase (Lip-Prot-Amyl). I am also having him maintain his aspirin, Psyllium (MEDI-MUCIL PO), dutasteride, Bisacodyl (DULCOLAX PO), nitroGLYCERIN, pantoprazole, fluticasone, clobetasol, meloxicam, cyclobenzaprine, ipratropium, Vitamin D (Ergocalciferol), traMADol, lisinopril, metoprolol succinate, ALPRAZolam, gabapentin, rosuvastatin, and zolpidem.  Meds ordered this encounter  Medications  . Pancrelipase, Lip-Prot-Amyl, (ZENPEP) 40000-126000 units CPEP    Sig: Take 1 capsule by mouth 4 (four) times daily -  before meals and at bedtime.    Dispense:  360 capsule     Refill:  1     Follow-up: Return in about 3 weeks (around 01/17/2017).  Scarlette Calico, MD

## 2016-12-27 NOTE — Patient Instructions (Signed)

## 2016-12-28 ENCOUNTER — Other Ambulatory Visit: Payer: Medicare Other

## 2016-12-28 DIAGNOSIS — R197 Diarrhea, unspecified: Secondary | ICD-10-CM

## 2016-12-28 DIAGNOSIS — K8681 Exocrine pancreatic insufficiency: Secondary | ICD-10-CM

## 2016-12-30 ENCOUNTER — Encounter: Payer: Self-pay | Admitting: Internal Medicine

## 2016-12-30 ENCOUNTER — Ambulatory Visit (INDEPENDENT_AMBULATORY_CARE_PROVIDER_SITE_OTHER)
Admission: RE | Admit: 2016-12-30 | Discharge: 2016-12-30 | Disposition: A | Discharge status: Home / Self Care | Payer: Medicare Other | Source: Ambulatory Visit | Attending: Internal Medicine | Admitting: Internal Medicine

## 2016-12-30 DIAGNOSIS — R10816 Epigastric abdominal tenderness: Secondary | ICD-10-CM | POA: Diagnosis not present

## 2016-12-30 DIAGNOSIS — K5730 Diverticulosis of large intestine without perforation or abscess without bleeding: Secondary | ICD-10-CM | POA: Diagnosis not present

## 2016-12-30 DIAGNOSIS — R140 Abdominal distension (gaseous): Secondary | ICD-10-CM | POA: Diagnosis not present

## 2016-12-30 MED ORDER — IOPAMIDOL (ISOVUE-300) INJECTION 61%
100.0000 mL | Freq: Once | INTRAVENOUS | Status: AC | PRN
  Administered 2016-12-30: 100 mL via INTRAVENOUS

## 2017-01-04 ENCOUNTER — Encounter: Payer: Self-pay | Admitting: Internal Medicine

## 2017-01-04 LAB — PANCREATIC ELASTASE, FECAL: Pancreatic Elastase-1, Stool: >500 mcg/g

## 2017-01-12 DIAGNOSIS — Z85820 Personal history of malignant melanoma of skin: Secondary | ICD-10-CM | POA: Diagnosis not present

## 2017-01-12 DIAGNOSIS — Z85828 Personal history of other malignant neoplasm of skin: Secondary | ICD-10-CM | POA: Diagnosis not present

## 2017-01-12 DIAGNOSIS — C44722 Squamous cell carcinoma of skin of right lower limb, including hip: Secondary | ICD-10-CM | POA: Diagnosis not present

## 2017-01-12 DIAGNOSIS — D485 Neoplasm of uncertain behavior of skin: Secondary | ICD-10-CM | POA: Diagnosis not present

## 2017-01-20 ENCOUNTER — Ambulatory Visit: Payer: Medicare Other | Admitting: Internal Medicine

## 2017-02-04 DIAGNOSIS — D485 Neoplasm of uncertain behavior of skin: Secondary | ICD-10-CM | POA: Diagnosis not present

## 2017-02-04 DIAGNOSIS — Z85820 Personal history of malignant melanoma of skin: Secondary | ICD-10-CM | POA: Diagnosis not present

## 2017-02-04 DIAGNOSIS — C44629 Squamous cell carcinoma of skin of left upper limb, including shoulder: Secondary | ICD-10-CM | POA: Diagnosis not present

## 2017-02-04 DIAGNOSIS — Z85828 Personal history of other malignant neoplasm of skin: Secondary | ICD-10-CM | POA: Diagnosis not present

## 2017-02-07 DIAGNOSIS — G5601 Carpal tunnel syndrome, right upper limb: Secondary | ICD-10-CM | POA: Diagnosis not present

## 2017-02-07 DIAGNOSIS — M79641 Pain in right hand: Secondary | ICD-10-CM | POA: Diagnosis not present

## 2017-02-25 DIAGNOSIS — D1801 Hemangioma of skin and subcutaneous tissue: Secondary | ICD-10-CM | POA: Diagnosis not present

## 2017-02-25 DIAGNOSIS — D692 Other nonthrombocytopenic purpura: Secondary | ICD-10-CM | POA: Diagnosis not present

## 2017-02-25 DIAGNOSIS — Z85828 Personal history of other malignant neoplasm of skin: Secondary | ICD-10-CM | POA: Diagnosis not present

## 2017-02-25 DIAGNOSIS — D225 Melanocytic nevi of trunk: Secondary | ICD-10-CM | POA: Diagnosis not present

## 2017-02-25 DIAGNOSIS — L821 Other seborrheic keratosis: Secondary | ICD-10-CM | POA: Diagnosis not present

## 2017-02-25 DIAGNOSIS — L570 Actinic keratosis: Secondary | ICD-10-CM | POA: Diagnosis not present

## 2017-02-25 DIAGNOSIS — L814 Other melanin hyperpigmentation: Secondary | ICD-10-CM | POA: Diagnosis not present

## 2017-02-25 DIAGNOSIS — Z85820 Personal history of malignant melanoma of skin: Secondary | ICD-10-CM | POA: Diagnosis not present

## 2017-02-25 DIAGNOSIS — D2272 Melanocytic nevi of left lower limb, including hip: Secondary | ICD-10-CM | POA: Diagnosis not present

## 2017-04-11 ENCOUNTER — Ambulatory Visit: Payer: Medicare Other | Admitting: Internal Medicine

## 2017-04-18 DIAGNOSIS — H2511 Age-related nuclear cataract, right eye: Secondary | ICD-10-CM | POA: Diagnosis not present

## 2017-04-19 ENCOUNTER — Telehealth (HOSPITAL_COMMUNITY): Payer: Self-pay | Admitting: *Deleted

## 2017-04-19 NOTE — Telephone Encounter (Signed)
Left message on voicemail per DPR in reference to upcoming appointment scheduled on 06/26/16 at 0730 with detailed instructions given per Myocardial Perfusion Study Information Sheet for the test. LM to arrive 15 minutes early, and that it is imperative to arrive on time for appointment to keep from having the test rescheduled. If you need to cancel or reschedule your appointment, please call the office within 24 hours of your appointment. Failure to do so may result in a cancellation of your appointment, and a $50 no show fee. Phone number given for call back for any questions.

## 2017-04-22 ENCOUNTER — Other Ambulatory Visit: Payer: Self-pay | Admitting: Internal Medicine

## 2017-04-25 ENCOUNTER — Encounter (HOSPITAL_COMMUNITY): Payer: Medicare Other

## 2017-04-27 ENCOUNTER — Ambulatory Visit (HOSPITAL_COMMUNITY): Payer: Medicare Other | Attending: Cardiology

## 2017-04-27 DIAGNOSIS — I739 Peripheral vascular disease, unspecified: Secondary | ICD-10-CM | POA: Diagnosis not present

## 2017-04-27 DIAGNOSIS — I10 Essential (primary) hypertension: Secondary | ICD-10-CM

## 2017-04-27 DIAGNOSIS — I472 Ventricular tachycardia: Secondary | ICD-10-CM | POA: Insufficient documentation

## 2017-04-27 DIAGNOSIS — I2510 Atherosclerotic heart disease of native coronary artery without angina pectoris: Secondary | ICD-10-CM | POA: Insufficient documentation

## 2017-04-27 DIAGNOSIS — I4729 Other ventricular tachycardia: Secondary | ICD-10-CM

## 2017-04-27 LAB — MYOCARDIAL PERFUSION IMAGING
CHL CUP NUCLEAR SDS: 2
CHL CUP NUCLEAR SRS: 2
CHL CUP NUCLEAR SSS: 4
CHL CUP RESTING HR STRESS: 77 {beats}/min
CSEPEDS: 0 s
CSEPPHR: 127 {beats}/min
Estimated workload: 7 METS
Exercise duration (min): 6 min
LHR: 0.27
MPHR: 139 {beats}/min
Percent HR: 91 %
TID: 0.93

## 2017-04-27 MED ORDER — TECHNETIUM TC 99M TETROFOSMIN IV KIT
32.4000 | PACK | Freq: Once | INTRAVENOUS | Status: AC | PRN
  Administered 2017-04-27: 32.4 via INTRAVENOUS
  Filled 2017-04-27: qty 33

## 2017-04-27 MED ORDER — TECHNETIUM TC 99M TETROFOSMIN IV KIT
10.2000 | PACK | Freq: Once | INTRAVENOUS | Status: AC | PRN
  Administered 2017-04-27: 10.2 via INTRAVENOUS
  Filled 2017-04-27: qty 11

## 2017-04-28 ENCOUNTER — Telehealth: Payer: Self-pay | Admitting: Cardiovascular Disease

## 2017-04-28 MED ORDER — CRESTOR 40 MG PO TABS: 40.0000 mg | ORAL_TABLET | Freq: Every day | ORAL | 3 refills | Status: DC

## 2017-04-28 MED ORDER — LISINOPRIL 10 MG PO TABS: 10.0000 mg | ORAL_TABLET | Freq: Every day | ORAL | 3 refills | Status: DC

## 2017-04-28 NOTE — Telephone Encounter (Signed)
New message ° °Pt verbalized that he is returning call for the rn  °

## 2017-04-28 NOTE — Telephone Encounter (Signed)
Patient requested refills for Crestor (nongenetic) and lisinopril through mail order pharmacy.

## 2017-04-28 NOTE — Telephone Encounter (Signed)
Called patient back with stress test results. Patient verbalized understanding.

## 2017-05-31 ENCOUNTER — Encounter: Payer: Self-pay | Admitting: Cardiovascular Disease

## 2017-06-02 DIAGNOSIS — H25811 Combined forms of age-related cataract, right eye: Secondary | ICD-10-CM | POA: Diagnosis not present

## 2017-06-02 DIAGNOSIS — H2511 Age-related nuclear cataract, right eye: Secondary | ICD-10-CM | POA: Diagnosis not present

## 2017-06-03 DIAGNOSIS — L438 Other lichen planus: Secondary | ICD-10-CM | POA: Diagnosis not present

## 2017-06-03 DIAGNOSIS — Z85828 Personal history of other malignant neoplasm of skin: Secondary | ICD-10-CM | POA: Diagnosis not present

## 2017-06-03 DIAGNOSIS — L570 Actinic keratosis: Secondary | ICD-10-CM | POA: Diagnosis not present

## 2017-06-03 DIAGNOSIS — Z85820 Personal history of malignant melanoma of skin: Secondary | ICD-10-CM | POA: Diagnosis not present

## 2017-06-13 ENCOUNTER — Ambulatory Visit: Payer: Medicare Other | Admitting: Internal Medicine

## 2017-06-13 NOTE — Progress Notes (Signed)
Patient ID: [name] [name], male   DOB: 10-22-1932, 84 y.o.   MRN: [id number] 84 y.o. history of CAD. Stent to LAD in 2008.  ETT in 2013 abnormal with NSVT. Cath 05/01/12 30% ISR LaD and distal RCA 60-75% with FFR CT .92 Rx medically Myovue done 04/27/17 reviewed and normal with no ischemia study not gated due to PAC;s EF normal echo 06/25/15 55-60% LA only mildly dilated   Continues to have episodes of transient visual loss.  ? Occular migraines Been going on for over 3-4 years  Seen by neurology And MRI/MRA no abnormality and EEG no epileptic foci  01/11/14  1-39% bilateral carotid disease   Seen by Dr Rayann Heman 06/11/15 for possible loop recorder as 30 day event monitor PAC;s no PAF Did not f/u for this after neurology evaluation  Daughter in [place] has a hypochondriac for husband Daughter in [place] not working as PA but he likes her husband better  ROS: Denies fever, malais, weight loss, blurry vision, decreased visual acuity, cough, sputum, SOB, hemoptysis, pleuritic pain, palpitaitons, heartburn, abdominal pain, melena, lower extremity edema, claudication, or rash.  All other systems reviewed and negative  General: BP 120/70   Pulse 93   Ht 5\' 10"  (1.778 m)   Wt 150 lb 9.6 oz (68.3 kg)   SpO2 96%   BMI 21.61 kg/m  Affect appropriate Healthy:  appears stated age 36: normal Neck supple with no adenopathy JVP normal no bruits no thyromegaly Lungs clear with no wheezing and good diaphragmatic motion Heart:  S1/S2 no murmur, no rub, gallop or click PMI normal Abdomen: benighn, BS positve, no tenderness, no AAA no bruit.  No HSM or HJR Distal pulses intact with no bruits No edema Neuro non-focal Bruises easy from dog Bunny  No muscular weakness    Current Outpatient Medications  Medication Sig Dispense Refill  . ALPRAZolam (XANAX) 0.25 MG tablet take 1/2 to 1 tablet by mouth at bedtime if needed for sleep 30 tablet 0  . aspirin 81 MG tablet Take 81 mg by mouth daily.      .  Bisacodyl (DULCOLAX PO) Take 1 tablet by mouth daily as needed (for consitpation).     . clobetasol (TEMOVATE) 0.05 % external solution APPLY ON THE SCALP NIGHTLY AS DIRECTED  0  . CRESTOR 40 MG tablet Take 1 tablet (40 mg total) by mouth daily. 90 tablet 3  . cyclobenzaprine (FLEXERIL) 10 MG tablet Take 1 tablet (10 mg total) by mouth 3 (three) times daily as needed for muscle spasms. 30 tablet 0  . dutasteride (AVODART) 0.5 MG capsule Take 0.5 mg by mouth daily.    . fluocinonide cream (LIDEX) 0.05 % as directed.  0  . fluticasone (FLONASE) 50 MCG/ACT nasal spray USE 2 SPRAYS IN EACH NOSTRIL DAILY 32 g 11  . gabapentin (NEURONTIN) 100 MG capsule Take 2 capsules (200 mg total) by mouth at bedtime. 60 capsule 3  . ipratropium (ATROVENT) 0.03 % nasal spray USE 2 SPRAYS INTO BOTH NOSTRILS EVERY 12 HOURS 90 mL 2  . lisinopril (PRINIVIL,ZESTRIL) 10 MG tablet Take 1 tablet (10 mg total) by mouth daily. 90 tablet 3  . meloxicam (MOBIC) 15 MG tablet Take 1 tablet (15 mg total) by mouth daily. 30 tablet 0  . metoprolol succinate (TOPROL XL) 25 MG 24 hr tablet Take 1 tablet (25 mg total) by mouth daily. 90 tablet 3  . nitroGLYCERIN (NITROSTAT) 0.4 MG SL tablet Place 0.4 mg under the tongue every 5 (five)  minutes as needed for chest pain (up to 3 doses only).    . Pancrelipase, Lip-Prot-Amyl, (ZENPEP) 40000-126000 units CPEP Take 1 capsule by mouth 4 (four) times daily -  before meals and at bedtime. 360 capsule 1  . pantoprazole (PROTONIX) 40 MG tablet Take 1 tablet (40 mg total) by mouth daily. 90 tablet 3  . Psyllium (MEDI-MUCIL PO) Take 240 g by mouth daily. Every morning     . traMADol (ULTRAM) 50 MG tablet Take 50 mg by mouth every 12 (twelve) hours as needed (pain).    . Vitamin D, Ergocalciferol, (DRISDOL) 50000 units CAPS capsule Take 1 capsule (50,000 Units total) by mouth every 7 (seven) days. 12 capsule 0  . zolpidem (AMBIEN) 10 MG tablet TAKE 1 TABLET BY MOUTH AT BEDTIME AS NEEDED FOR SLEEP 30  tablet 2   No current facility-administered medications for this visit.     Allergies  Patient has no known allergies.  Electrocardiogram:  04/18/14  SR rate 68  ICRBBB old IMI possible nonspecific ST T wave changes  no change from 2014  06/10/15  SR rate 40 PAC/PVC ICRBBB old IMI No afib.   02/18/16  SR ICRBBB old IMI  06/15/17 SR rte 62 RBBB   Assessment and Plan CAD: Stable with no angina and good activity level.  Continue medical Rx Stent LAD 2008  Moderate distal RCA with normal flow wire 04/2012  Non ischemic myovue 04/27/17  Chol: Lab Results  Component Value Date   LDLCALC 47 07/20/2016    GERD:  Continue protonix   Insmonia:  Chronic on ambien f/u Dr Ronnald Ramp  Visual Disturbance:  MRI/MRA normal EEG normal has been seen by neurology    F/U with me in 6 months   [name]

## 2017-06-15 ENCOUNTER — Encounter: Payer: Self-pay | Admitting: Cardiovascular Disease

## 2017-06-15 ENCOUNTER — Ambulatory Visit (INDEPENDENT_AMBULATORY_CARE_PROVIDER_SITE_OTHER): Payer: Medicare Other | Admitting: Cardiovascular Disease

## 2017-06-15 VITALS — BP 120/70 | HR 93 | Ht 70.0 in | Wt 150.6 lb

## 2017-06-15 DIAGNOSIS — I2510 Atherosclerotic heart disease of native coronary artery without angina pectoris: Secondary | ICD-10-CM

## 2017-06-15 NOTE — Patient Instructions (Signed)

## 2017-07-07 DIAGNOSIS — G5603 Carpal tunnel syndrome, bilateral upper limbs: Secondary | ICD-10-CM | POA: Diagnosis not present

## 2017-07-11 DIAGNOSIS — G5601 Carpal tunnel syndrome, right upper limb: Secondary | ICD-10-CM | POA: Diagnosis not present

## 2017-07-29 DIAGNOSIS — Z85820 Personal history of malignant melanoma of skin: Secondary | ICD-10-CM | POA: Diagnosis not present

## 2017-07-29 DIAGNOSIS — Z85828 Personal history of other malignant neoplasm of skin: Secondary | ICD-10-CM | POA: Diagnosis not present

## 2017-07-29 DIAGNOSIS — L570 Actinic keratosis: Secondary | ICD-10-CM | POA: Diagnosis not present

## 2017-08-02 NOTE — Progress Notes (Addendum)
Subjective:   [name] [name] is a 84 y.o. male who presents for Medicare Annual/Subsequent preventive examination.  Review of Systems:  No ROS.  Medicare Wellness Visit. Additional risk factors are reflected in the social history.  Cardiac Risk Factors include: advanced age (>52men, >14 women);dyslipidemia;hypertension;male gender Sleep patterns: feels rested on waking, gets up 1-2 times nightly to void and sleeps 7-8 hours nightly.    Home Safety/Smoke Alarms: Feels safe in home. Smoke alarms in place.  Living environment; residence and Firearm Safety: 2-story house, no firearms., Lives with wife, no needs for DME, good support system Seat Belt Safety/Bike Helmet: Wears seat belt.   PSA-  Lab Results  Component Value Date   PSA 0.44 05/10/2011   PSA 0.55 05/07/2010   PSA 0.59 04/28/2009       Objective:    Vitals: There were no vitals taken for this visit.  There is no height or weight on file to calculate BMI.  Advanced Directives 08/03/2017 07/25/2016 07/10/2015 05/03/2012  Does Patient Have a Medical Advance Directive? Yes Yes Yes Patient does not have advance directive  Type of Advance Directive [place];Living will [place];Living will [place];Living will -  Does patient want to make changes to medical advance directive? - - No - Patient declined -  Copy of [place] in Chart? No - copy requested Yes Yes -  Pre-existing out of facility DNR order (yellow form or pink MOST form) - - - No    Tobacco Social History   Tobacco Use  Smoking Status Never Smoker  Smokeless Tobacco Never Used     Counseling given: Not Answered   Past Medical History:  Diagnosis Date  . Anxiety   . CAD (coronary artery disease)    a. s/p stent to LAD 2008;  b. abnormal ETT 11/13 with VTach => LHC pLAD 30% ISR, mCFX 30%, dRCA 60-75% which had progressed from previous study in 2010 => FFR of RCA 0.92 => med Rx     . Carotid stenosis    dopplers 1/14:  0-39% bilateral ICA stenosis  . Colon polyps   . Colon polyps    adenomatous  . Diverticular disease of colon   . Hemorrhoids    external and internal  . History of BPH   . HTN (hypertension)   . Hx of echocardiogram    a. Echo 9/11:  EF 55-60%, normal motion, grade 2 diastolic dysfunction, mild MR, mild LAE, mild RAE, PASP 33.  Marland Kitchen Hyperlipidemia   . IBS (irritable bowel syndrome)   . Rhinitis   . Stroke, lacunar 08/24/2012   Old, 2011 head mri   Past Surgical History:  Procedure Laterality Date  . APPENDECTOMY  1956  . CORONARY STENT PLACEMENT  2008    Successful PCI of the lesion in the proximal LAD using a Promus   . HERNIA REPAIR Right 2005  . NASAL SINUS SURGERY  1975  . PERCUTANEOUS CORONARY STENT INTERVENTION (PCI-S) N/A 05/03/2012   Procedure: PERCUTANEOUS CORONARY STENT INTERVENTION (PCI-S);  Surgeon: Sherren Mocha, MD;  Location: [facility] CATH LAB;  Service: Cardiovascular;  Laterality: N/A;  . Promus DES ok for 3T MRI  2008   Family History  Problem Relation Age of Onset  . Coronary artery disease Father   . Heart attack Father   . Rheum arthritis Mother   . Cancer Brother        ? TYPE  . Colon cancer Neg  Hx    Social History   Socioeconomic History  . Marital status: Married    Spouse name: Not on file  . Number of children: 3  . Years of education: 51  . Highest education level: Not on file  Social Needs  . Financial resource strain: Not on file  . Food insecurity - worry: Not on file  . Food insecurity - inability: Not on file  . Transportation needs - medical: Not on file  . Transportation needs - non-medical: Not on file  Occupational History  . Occupation: businessman, 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RETIRED  Tobacco Use  . Smoking status: Never Smoker  . Smokeless tobacco: Never Used  Substance and Sexual Activity  . Alcohol use: Yes    Comment: 4-6 drinks/week  . Drug use: No  . Sexual activity: Yes     Partners: Female  Other Topics Concern  . 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 He has two daughters, 1 in [place] 1 in [place] (Dec '12) and a son who works with him. Several grandchildren.Reports that he spends a good deal of time at his home in [facility] which he greatly enjoys and his home in the [place]. Enjoys driving his BMW [place] when [place].      ACP - Yes CPR, yes for short-term mechanical ventilation for reversible disease. Recommended [email or web address] for consideration.    Outpatient Encounter Medications as of 08/03/2017  Medication Sig  . clobetasol (TEMOVATE) 0.05 % external solution APPLY ON THE SCALP NIGHTLY AS DIRECTED  . CRESTOR 40 MG tablet Take 1 tablet (40 mg total) by mouth daily.  Marland Kitchen dutasteride (AVODART) 0.5 MG capsule Take 0.5 mg by mouth daily.  . fluocinonide cream (LIDEX) 0.05 % as directed.  . fluticasone (FLONASE) 50 MCG/ACT nasal spray USE 2 SPRAYS IN EACH NOSTRIL DAILY  . gabapentin (NEURONTIN) 100 MG capsule Take 2 capsules (200 mg total) by mouth at bedtime.  Marland Kitchen ipratropium (ATROVENT) 0.03 % nasal spray USE 2 SPRAYS INTO BOTH NOSTRILS EVERY 12 HOURS  . lisinopril (PRINIVIL,ZESTRIL) 10 MG tablet Take 1 tablet (10 mg total) by mouth daily.  . metoprolol succinate (TOPROL XL) 25 MG 24 hr tablet Take 1 tablet (25 mg total) by mouth daily.  . nitroGLYCERIN (NITROSTAT) 0.4 MG SL tablet Place 0.4 mg under the tongue every 5 (five) minutes as needed for chest pain (up to 3 doses only).  . pantoprazole (PROTONIX) 40 MG tablet Take 1 tablet (40 mg total) by mouth daily.  . Vitamin D, Ergocalciferol, (DRISDOL) 50000 units CAPS capsule Take 1 capsule (50,000 Units total) by mouth every 7 (seven) days.  Marland Kitchen zolpidem (AMBIEN) 10 MG tablet TAKE 1 TABLET BY MOUTH AT BEDTIME AS NEEDED FOR SLEEP  .  [DISCONTINUED] ALPRAZolam (XANAX) 0.25 MG tablet take 1/2 to 1 tablet by mouth at bedtime if needed for sleep  . [DISCONTINUED] aspirin 81 MG tablet Take 81 mg by mouth daily.    . [DISCONTINUED] Bisacodyl (DULCOLAX PO) Take 1 tablet by mouth daily as needed (for consitpation).   . [DISCONTINUED] cyclobenzaprine (FLEXERIL) 10 MG tablet Take 1 tablet (10 mg total) by mouth 3 (three) times daily as needed for muscle spasms.  . [DISCONTINUED] meloxicam (MOBIC) 15 MG tablet Take 1 tablet (15 mg total) by mouth daily.  . [DISCONTINUED] Pancrelipase, Lip-Prot-Amyl, (ZENPEP) 40000-126000 units  CPEP Take 1 capsule by mouth 4 (four) times daily -  before meals and at bedtime.  . [DISCONTINUED] Psyllium (MEDI-MUCIL PO) Take 240 g by mouth daily. Every morning   . [DISCONTINUED] traMADol (ULTRAM) 50 MG tablet Take 50 mg by mouth every 12 (twelve) hours as needed (pain).   No facility-administered encounter medications on file as of 08/03/2017.     Activities of Daily Living In your present state of health, do you have any difficulty performing the following activities: 08/03/2017  Hearing? N  Vision? N  Difficulty concentrating or making decisions? N  Walking or climbing stairs? N  Dressing or bathing? N  Doing errands, shopping? N  Preparing Food and eating ? N  Using the Toilet? N  In the past six months, have you accidently leaked urine? N  Do you have problems with loss of bowel control? N  Managing your Medications? N  Managing your Finances? N  Housekeeping or managing your Housekeeping? N  Some recent data might be hidden    Patient Care Team: [name], MD as PCP - General (Internal Medicine)   Assessment:   This is a routine wellness examination for [facility]. Physical assessment deferred to PCP.   Exercise Activities and Dietary recommendations Current Exercise Habits: Home exercise routine, Type of exercise: walking, Time (Minutes): 40, Frequency (Times/Week): 4, Weekly Exercise  (Minutes/Week): 160, Intensity: Mild, Exercise limited by: None identified  Diet (meal preparation, eat out, water intake, caffeinated beverages, dairy products, fruits and vegetables): in general, a "healthy" diet  , well balanced, .die1   Goals    None      Fall Risk Fall Risk  08/03/2017 07/25/2016 07/10/2015 07/10/2015 07/09/2015  Falls in the past year? No No No Yes Yes  Number falls in past yr: - - - 1 1  Injury with Fall? - - - No No  Follow up - - - Falls evaluation completed -    Depression Screen PHQ 2/9 Scores 08/03/2017 07/25/2016 07/10/2015 07/09/2015  PHQ - 2 Score 0 0 0 0  PHQ- 9 Score 0 - - -    Cognitive Function MMSE - Mini Mental State Exam 08/03/2017  Orientation to time 5  Orientation to Place 5  Registration 3  Attention/ Calculation 5  Recall 3  Language- name 2 objects 2  Language- repeat 1  Language- follow 3 step command 3  Language- read & follow direction 1  Write a sentence 1  Copy design 1  Total score 30        Immunization History  Administered Date(s) Administered  . H1N1 04/24/2008  . Influenza Whole 02/27/2008, 04/28/2009, 03/01/2011  . Influenza, High Dose Seasonal PF 02/06/2013, 02/21/2014, 02/17/2015, 02/10/2017  . Influenza,inj,Quad PF,6+ Mos 02/16/2016  . Influenza-Unspecified 01/16/2012  . Pneumococcal Conjugate-13 06/26/2013, 02/21/2014  . Pneumococcal Polysaccharide-23 02/27/2008  . Pneumococcal-Unspecified 02/06/2013  . Tdap 12/15/2011  . Zoster 04/28/2009, 05/10/2011, 02/17/2015  . Zoster Recombinat (Shingrix) 02/10/2017   Screening Tests Health Maintenance  Topic Date Due  . TETANUS/TDAP  12/14/2021  . INFLUENZA VACCINE  Completed  . PNA vac Low Risk Adult  Completed      Plan:    Continue doing brain stimulating activities (puzzles, reading, adult coloring books, staying active) to keep memory sharp.   Continue to eat heart healthy diet (full of fruits, vegetables, whole grains, lean protein, water--limit salt,  fat, and sugar intake) and increase physical activity as tolerated.  I have personally reviewed and noted the following in  the patient's chart:   . Medical and social history . Use of alcohol, tobacco or illicit drugs  . Current medications and supplements . Functional ability and status . Nutritional status . Physical activity . Advanced directives . List of other physicians . Vitals . Screenings to include cognitive, depression, and falls . Referrals and appointments  In addition, I have reviewed and discussed with patient certain preventive protocols, quality metrics, and best practice recommendations. A written personalized care plan for preventive services as well as general preventive health recommendations were provided to patient.    Medical screening examination/treatment/procedure(s) were performed by non-physician practitioner and as supervising physician I was immediately available for consultation/collaboration. I agree with above. Scarlette Calico, MD    Michiel Cowboy, RN  08/03/2017

## 2017-08-03 ENCOUNTER — Ambulatory Visit (INDEPENDENT_AMBULATORY_CARE_PROVIDER_SITE_OTHER): Payer: Medicare Other | Admitting: *Deleted

## 2017-08-03 ENCOUNTER — Ambulatory Visit (INDEPENDENT_AMBULATORY_CARE_PROVIDER_SITE_OTHER): Payer: Medicare Other | Admitting: Internal Medicine

## 2017-08-03 ENCOUNTER — Encounter: Payer: Self-pay | Admitting: Internal Medicine

## 2017-08-03 VITALS — BP 128/58 | HR 70 | Temp 98.7°F | Resp 16 | Ht 70.0 in | Wt 148.0 lb

## 2017-08-03 DIAGNOSIS — E559 Vitamin D deficiency, unspecified: Secondary | ICD-10-CM | POA: Diagnosis not present

## 2017-08-03 DIAGNOSIS — Z0000 Encounter for general adult medical examination without abnormal findings: Secondary | ICD-10-CM | POA: Diagnosis not present

## 2017-08-03 DIAGNOSIS — K5904 Chronic idiopathic constipation: Secondary | ICD-10-CM | POA: Insufficient documentation

## 2017-08-03 DIAGNOSIS — E785 Hyperlipidemia, unspecified: Secondary | ICD-10-CM | POA: Diagnosis not present

## 2017-08-03 DIAGNOSIS — I10 Essential (primary) hypertension: Secondary | ICD-10-CM | POA: Diagnosis not present

## 2017-08-03 DIAGNOSIS — K8681 Exocrine pancreatic insufficiency: Secondary | ICD-10-CM | POA: Diagnosis not present

## 2017-08-03 MED ORDER — LUBIPROSTONE 24 MCG PO CAPS: 24.0000 ug | ORAL_CAPSULE | Freq: Two times a day (BID) | ORAL | 1 refills | Status: DC

## 2017-08-03 MED ORDER — PANCRELIPASE (LIP-PROT-AMYL) 40000-126000 UNITS PO CPEP: 1.0000 | ORAL_CAPSULE | Freq: Three times a day (TID) | ORAL | 1 refills | Status: DC

## 2017-08-03 NOTE — Patient Instructions (Signed)

## 2017-08-03 NOTE — Progress Notes (Signed)
Subjective:  Patient ID: [name] [name], male    DOB: 25-Jan-1933  Age: 84 y.o. MRN: [id number]  CC: Annual Exam; Constipation; Hypertension; and Hyperlipidemia   HPI [name] [name] presents for a CPX.  He started taking a pancreatic enzyme supplement about a year or 2 ago due to chronic diarrhea and weight loss.  He has done well with the pancreatic supplement but now he complains of intermittent constipation.  He is having to use Dulcolax suppository as well as fiber to try to induce a bowel movement after about 3 or 4 days.  He said a few episodes of feeling bloated when he is constipated but he denies abdominal pain, nausea, vomiting, or loss of appetite.  Past Medical History:  Diagnosis Date  . Anxiety   . CAD (coronary artery disease)    a. s/p stent to LAD 2008;  b. abnormal ETT 11/13 with VTach => LHC pLAD 30% ISR, mCFX 30%, dRCA 60-75% which had progressed from previous study in 2010 => FFR of RCA 0.92 => med Rx   . Carotid stenosis    dopplers 1/14:  0-39% bilateral ICA stenosis  . Colon polyps   . Colon polyps    adenomatous  . Diverticular disease of colon   . Hemorrhoids    external and internal  . History of BPH   . HTN (hypertension)   . Hx of echocardiogram    a. Echo 9/11:  EF 55-60%, normal motion, grade 2 diastolic dysfunction, mild MR, mild LAE, mild RAE, PASP 33.  Marland Kitchen Hyperlipidemia   . IBS (irritable bowel syndrome)   . Rhinitis   . Stroke, lacunar 08/24/2012   Old, 2011 head mri   Past Surgical History:  Procedure Laterality Date  . APPENDECTOMY  1956  . CORONARY STENT PLACEMENT  2008    Successful PCI of the lesion in the proximal LAD using a Promus   . HERNIA REPAIR Right 2005  . NASAL SINUS SURGERY  1975  . PERCUTANEOUS CORONARY STENT INTERVENTION (PCI-S) N/A 05/03/2012   Procedure: PERCUTANEOUS CORONARY STENT INTERVENTION (PCI-S);  Surgeon: Sherren Mocha, MD;  Location: [facility] CATH LAB;  Service: Cardiovascular;  Laterality: N/A;  . Promus DES ok for  3T MRI  2008    reports that he has never smoked. He has never used smokeless tobacco. He reports that he drinks alcohol. He reports that he does not use drugs. family history includes Cancer in his brother; Coronary artery disease in his father; Heart attack in his father; Rheum arthritis in his mother. No Known Allergies  Outpatient Medications Prior to Visit  Medication Sig Dispense Refill  . clobetasol (TEMOVATE) 0.05 % external solution APPLY ON THE SCALP NIGHTLY AS DIRECTED  0  . CRESTOR 40 MG tablet Take 1 tablet (40 mg total) by mouth daily. 90 tablet 3  . dutasteride (AVODART) 0.5 MG capsule Take 0.5 mg by mouth daily.    . fluocinonide cream (LIDEX) 0.05 % as directed.  0  . fluticasone (FLONASE) 50 MCG/ACT nasal spray USE 2 SPRAYS IN EACH NOSTRIL DAILY 32 g 11  . gabapentin (NEURONTIN) 100 MG capsule Take 2 capsules (200 mg total) by mouth at bedtime. 60 capsule 3  . ipratropium (ATROVENT) 0.03 % nasal spray USE 2 SPRAYS INTO BOTH NOSTRILS EVERY 12 HOURS 90 mL 2  . lisinopril (PRINIVIL,ZESTRIL) 10 MG tablet Take 1 tablet (10 mg total) by mouth daily. 90 tablet 3  . metoprolol succinate (TOPROL XL) 25 MG 24 hr tablet  Take 1 tablet (25 mg total) by mouth daily. 90 tablet 3  . pantoprazole (PROTONIX) 40 MG tablet Take 1 tablet (40 mg total) by mouth daily. 90 tablet 3  . zolpidem (AMBIEN) 10 MG tablet TAKE 1 TABLET BY MOUTH AT BEDTIME AS NEEDED FOR SLEEP 30 tablet 2  . ALPRAZolam (XANAX) 0.25 MG tablet take 1/2 to 1 tablet by mouth at bedtime if needed for sleep 30 tablet 0  . aspirin 81 MG tablet Take 81 mg by mouth daily.      . Bisacodyl (DULCOLAX PO) Take 1 tablet by mouth daily as needed (for consitpation).     . cyclobenzaprine (FLEXERIL) 10 MG tablet Take 1 tablet (10 mg total) by mouth 3 (three) times daily as needed for muscle spasms. 30 tablet 0  . meloxicam (MOBIC) 15 MG tablet Take 1 tablet (15 mg total) by mouth daily. 30 tablet 0  . Pancrelipase, Lip-Prot-Amyl, (ZENPEP)  40000-126000 units CPEP Take 1 capsule by mouth 4 (four) times daily -  before meals and at bedtime. 360 capsule 1  . Psyllium (MEDI-MUCIL PO) Take 240 g by mouth daily. Every morning     . traMADol (ULTRAM) 50 MG tablet Take 50 mg by mouth every 12 (twelve) hours as needed (pain).    . Vitamin D, Ergocalciferol, (DRISDOL) 50000 units CAPS capsule Take 1 capsule (50,000 Units total) by mouth every 7 (seven) days. 12 capsule 0  . nitroGLYCERIN (NITROSTAT) 0.4 MG SL tablet Place 0.4 mg under the tongue every 5 (five) minutes as needed for chest pain (up to 3 doses only).     No facility-administered medications prior to visit.     ROS Review of Systems  Constitutional: Negative.  Negative for appetite change, diaphoresis, fatigue and unexpected weight change.  HENT: Negative.  Negative for trouble swallowing.   Eyes: Negative for visual disturbance.  Respiratory: Negative for cough, chest tightness, shortness of breath and wheezing.   Cardiovascular: Negative for chest pain, palpitations and leg swelling.  Gastrointestinal: Positive for constipation. Negative for abdominal pain, blood in stool, diarrhea, nausea, rectal pain and vomiting.  Endocrine: Negative.   Genitourinary: Negative.  Negative for difficulty urinating, dysuria, hematuria, scrotal swelling, testicular pain and urgency.  Musculoskeletal: Negative.  Negative for arthralgias, back pain, myalgias and neck pain.  Skin: Negative.  Negative for color change, pallor and rash.  Allergic/Immunologic: Negative.   Neurological: Negative.  Negative for dizziness, light-headedness and headaches.  Hematological: Negative for adenopathy. Does not bruise/bleed easily.  Psychiatric/Behavioral: Negative.  Negative for decreased concentration, dysphoric mood and suicidal ideas. The patient is not nervous/anxious.     Objective:  BP (!) 128/58 (BP Location: Left Arm, Patient Position: Sitting, Cuff Size: Normal)   Pulse 70   Temp 98.7 F  (37.1 C) (Oral)   Resp 16   Ht 5\' 10"  (1.778 m)   Wt 148 lb (67.1 kg)   SpO2 99%   BMI 21.24 kg/m   BP Readings from Last 3 Encounters:  08/03/17 (!) 128/58  06/15/17 120/70  12/27/16 (!) 110/54    Wt Readings from Last 3 Encounters:  08/03/17 148 lb (67.1 kg)  06/15/17 150 lb 9.6 oz (68.3 kg)  04/27/17 147 lb (66.7 kg)    Physical Exam  Constitutional: He is oriented to person, place, and time. No distress.  HENT:  Mouth/Throat: Oropharynx is clear and moist. No oropharyngeal exudate.  Eyes: Conjunctivae are normal. Right eye exhibits no discharge. Left eye exhibits no discharge. No scleral icterus.  Neck: Normal range of motion. Neck supple. No JVD present. No tracheal deviation present. No thyromegaly present.  Cardiovascular: Normal rate, regular rhythm and normal heart sounds. Exam reveals no gallop and no friction rub.  No murmur heard. Pulmonary/Chest: Effort normal and breath sounds normal. No respiratory distress. He has no wheezes. He has no rales.  Abdominal: Soft. Bowel sounds are normal. He exhibits no distension and no mass. There is no tenderness. There is no rebound and no guarding. Hernia confirmed negative in the right inguinal area and confirmed negative in the left inguinal area.  Genitourinary: Testes normal and penis normal. Rectal exam shows external hemorrhoid. Rectal exam shows no internal hemorrhoid, no fissure, no mass, no tenderness and guaiac negative stool. Prostate is enlarged (1+ smooth symm BPH). Prostate is not tender. Right testis shows no mass, no swelling and no tenderness. Left testis shows no swelling and no tenderness. Circumcised. No penile tenderness. No discharge found.  Musculoskeletal: Normal range of motion. He exhibits no edema, tenderness or deformity.  Lymphadenopathy:    He has no cervical adenopathy.       Right: No inguinal adenopathy present.       Left: No inguinal adenopathy present.  Neurological: He is alert and oriented to  person, place, and time.  Skin: Skin is warm and dry. No rash noted. He is not diaphoretic. No erythema. No pallor.  Psychiatric: He has a normal mood and affect. His behavior is normal. Judgment and thought content normal.  Vitals reviewed.   Lab Results  Component Value Date   WBC 6.7 08/04/2017   HGB 14.5 08/04/2017   HCT 42.5 08/04/2017   PLT 196.0 08/04/2017   GLUCOSE 98 08/04/2017   CHOL 132 08/04/2017   TRIG 128.0 08/04/2017   HDL 62.00 08/04/2017   LDLDIRECT 132.2 03/10/2007   LDLCALC 44 08/04/2017   ALT 15 08/04/2017   AST 18 08/04/2017   NA 141 08/04/2017   K 4.1 08/04/2017   CL 107 08/04/2017   CREATININE 1.07 08/04/2017   BUN 14 08/04/2017   CO2 25 08/04/2017   TSH 3.61 08/04/2017   PSA 0.44 05/10/2011   INR 1.0 04/27/2012    Ct Abdomen Pelvis W Contrast  Result Date: 12/30/2016 CLINICAL DATA:  Epigastric tenderness, some rebound, abdominal bloating, lack of appetite EXAM: CT ABDOMEN AND PELVIS WITH CONTRAST TECHNIQUE: Multidetector CT imaging of the abdomen and pelvis was performed using the standard protocol following bolus administration of intravenous contrast. CONTRAST:  131mL ISOVUE-300 IOPAMIDOL (ISOVUE-300) INJECTION 61% COMPARISON:  None. FINDINGS: Lower chest: The lung bases are clear. The heart is within normal limits in size for age. No pericardial effusion is seen. There may be a small hiatal hernia present. Hepatobiliary: The single well defined low-attenuation structure is noted in the left lobe of liver most likely benign. No other hepatic abnormality is seen. Pancreas: The pancreas is normal in size and the pancreatic duct is not dilated. Spleen: The spleen is unremarkable. Adrenals/Urinary Tract: The adrenal glands appear normal. The kidneys enhance with no evidence of mass or calculi. A cyst emanates from the upper pole of the right kidney measuring 2.6 cm diameter. On delayed images the pelvocaliceal systems appear normal. Small low-attenuation renal  structures are present bilaterally most consistent with small renal cysts. The ureters appear normal in caliber. The urinary bladder is slightly urine distended and there is indentation by and enlarged prostate present. A right Hutch diverticulum is present as well. The bladder wall is somewhat thick wall  which may indicate a degree of bladder outlet obstruction. Stomach/Bowel: The stomach is decompressed. No small bowel distention is seen. There are rectosigmoid colon diverticula present. No diverticulitis is noted. Some contrast and feces is noted throughout the colon. No obvious colonic lesion is noted. The terminal ileum is unremarkable. No inflammatory process is seen within the right lower quadrant. Vascular/Lymphatic: The abdominal aorta is normal in caliber with mild abdominal aortic atherosclerosis noted. No adenopathy is seen. Reproductive: The prostate is enlarged measuring 4.1 x 4.6 cm per Other: None. Musculoskeletal: Degenerative disc disease is noted primarily at L5-S1. IMPRESSION: 1. No explanation for the patient's abdominal tenderness is seen. 2. Slightly thick-walled urinary bladder may be due to a degree of bladder outlet obstruction. Small right Hutch diverticulum. 3. Rectosigmoid colon diverticula.  No evidence of diverticulitis. 4. Prominent prostate. Electronically Signed   By: Ivar Drape M.D.   On: 12/30/2016 11:37    Assessment & Plan:   Zayed was seen today for annual exam, constipation, hypertension and hyperlipidemia.  Diagnoses and all orders for this visit:  Routine health maintenance  Exocrine pancreatic insufficiency -     Pancrelipase, Lip-Prot-Amyl, (ZENPEP) 40000-126000 units CPEP; Take 1 capsule by mouth 4 (four) times daily -  before meals and at bedtime.  Chronic idiopathic constipation- His labs are negative for secondary causes.  Will treat for chronic idiopathic constipation with Amitiza. -     lubiprostone (AMITIZA) 24 MCG capsule; Take 1 capsule (24 mcg  total) by mouth 2 (two) times daily with a meal. -     Magnesium; Future -     Comprehensive metabolic panel; Future  Vitamin D deficiency- His vitamin D level is slightly low.  I have asked him to restart his vitamin D supplement. -     VITAMIN D 25 Hydroxy (Vit-D Deficiency, Fractures); Future  Essential hypertension- His blood pressure is adequately well controlled.  Electrolytes and renal function are normal. -     Comprehensive metabolic panel; Future -     CBC with Differential/Platelet; Future -     Thyroid Panel With TSH; Future -     VITAMIN D 25 Hydroxy (Vit-D Deficiency, Fractures); Future  Hyperlipidemia with target LDL less than 70- He has achieved his LDL goal and is doing well on the statin. -     Lipid panel; Future -     Thyroid Panel With TSH; Future   I have discontinued [name] [name] aspirin, Psyllium (MEDI-MUCIL PO), Bisacodyl (DULCOLAX PO), meloxicam, cyclobenzaprine, traMADol, and ALPRAZolam. I am also having him start on lubiprostone. Additionally, I am having him maintain his dutasteride, nitroGLYCERIN, pantoprazole, fluticasone, clobetasol, ipratropium, metoprolol succinate, gabapentin, zolpidem, lisinopril, CRESTOR, fluocinonide cream, Pancrelipase (Lip-Prot-Amyl), and Vitamin D (Ergocalciferol).  Meds ordered this encounter  Medications  . lubiprostone (AMITIZA) 24 MCG capsule    Sig: Take 1 capsule (24 mcg total) by mouth 2 (two) times daily with a meal.    Dispense:  180 capsule    Refill:  1  . Pancrelipase, Lip-Prot-Amyl, (ZENPEP) 40000-126000 units CPEP    Sig: Take 1 capsule by mouth 4 (four) times daily -  before meals and at bedtime.    Dispense:  360 capsule    Refill:  1  . Vitamin D, Ergocalciferol, (DRISDOL) 50000 units CAPS capsule    Sig: Take 1 capsule (50,000 Units total) by mouth every 7 (seven) days.    Dispense:  12 capsule    Refill:  1   See AVS for instructions  about healthy living and anticipatory  guidance.  Follow-up: Return if symptoms worsen or fail to improve.  Scarlette Calico, MD

## 2017-08-03 NOTE — Patient Instructions (Addendum)
Continue doing brain stimulating activities (puzzles, reading, adult coloring books, staying active) to keep memory sharp.   Continue to eat heart healthy diet (full of fruits, vegetables, whole grains, lean protein, water--limit salt, fat, and sugar intake) and increase physical activity as tolerated.   [name] [name] , Thank you for taking time to come for your Medicare Wellness Visit. I appreciate your ongoing commitment to your health goals. Please review the following plan we discussed and let me know if I can assist you in the future.   These are the goals we discussed: Maintain current healthy status.  Goals    None      This is a list of the screening recommended for you and due dates:  Health Maintenance  Topic Date Due  . Tetanus Vaccine  12/14/2021  . Flu Shot  Completed  . Pneumonia vaccines  Completed

## 2017-08-04 ENCOUNTER — Other Ambulatory Visit (INDEPENDENT_AMBULATORY_CARE_PROVIDER_SITE_OTHER): Payer: Medicare Other

## 2017-08-04 DIAGNOSIS — E785 Hyperlipidemia, unspecified: Secondary | ICD-10-CM | POA: Diagnosis not present

## 2017-08-04 DIAGNOSIS — K5904 Chronic idiopathic constipation: Secondary | ICD-10-CM

## 2017-08-04 DIAGNOSIS — E559 Vitamin D deficiency, unspecified: Secondary | ICD-10-CM | POA: Diagnosis not present

## 2017-08-04 DIAGNOSIS — I10 Essential (primary) hypertension: Secondary | ICD-10-CM

## 2017-08-04 LAB — CBC WITH DIFFERENTIAL/PLATELET
BASOS ABS: 0 10*3/uL (ref 0.0–0.1)
Basophils Relative: 0.4 % (ref 0.0–3.0)
EOS PCT: 2.8 % (ref 0.0–5.0)
Eosinophils Absolute: 0.2 10*3/uL (ref 0.0–0.7)
HCT: 42.5 % (ref 39.0–52.0)
Hemoglobin: 14.5 g/dL (ref 13.0–17.0)
LYMPHS ABS: 2 10*3/uL (ref 0.7–4.0)
Lymphocytes Relative: 30.4 % (ref 12.0–46.0)
MCHC: 34.1 g/dL (ref 30.0–36.0)
MCV: 90.9 fl (ref 78.0–100.0)
MONOS PCT: 9.4 % (ref 3.0–12.0)
Monocytes Absolute: 0.6 10*3/uL (ref 0.1–1.0)
NEUTROS PCT: 57 % (ref 43.0–77.0)
Neutro Abs: 3.8 10*3/uL (ref 1.4–7.7)
Platelets: 196 10*3/uL (ref 150.0–400.0)
RBC: 4.68 Mil/uL (ref 4.22–5.81)
RDW: 13.7 % (ref 11.5–15.5)
WBC: 6.7 10*3/uL (ref 4.0–10.5)

## 2017-08-04 LAB — LIPID PANEL
CHOL/HDL RATIO: 2
Cholesterol: 132 mg/dL (ref 0–200)
HDL: 62 mg/dL (ref >39.00)
LDL Cholesterol: 44 mg/dL (ref 0–99)
NONHDL: 69.5
Triglycerides: 128 mg/dL (ref 0.0–149.0)
VLDL: 25.6 mg/dL (ref 0.0–40.0)

## 2017-08-04 LAB — COMPREHENSIVE METABOLIC PANEL
ALK PHOS: 52 U/L (ref 39–117)
ALT: 15 U/L (ref 0–53)
AST: 18 U/L (ref 0–37)
Albumin: 4.4 g/dL (ref 3.5–5.2)
BILIRUBIN TOTAL: 0.3 mg/dL (ref 0.2–1.2)
BUN: 14 mg/dL (ref 6–23)
CO2: 25 mEq/L (ref 19–32)
Calcium: 9.8 mg/dL (ref 8.4–10.5)
Chloride: 107 mEq/L (ref 96–112)
Creatinine, Ser: 1.07 mg/dL (ref 0.40–1.50)
GFR: 70.28 mL/min (ref >60.00)
GLUCOSE: 98 mg/dL (ref 70–99)
Potassium: 4.1 mEq/L (ref 3.5–5.1)
Sodium: 141 mEq/L (ref 135–145)
TOTAL PROTEIN: 6.7 g/dL (ref 6.0–8.3)

## 2017-08-04 LAB — VITAMIN D 25 HYDROXY (VIT D DEFICIENCY, FRACTURES): VITD: 25.82 ng/mL — ABNORMAL LOW (ref 30.00–100.00)

## 2017-08-04 LAB — MAGNESIUM: MAGNESIUM: 2 mg/dL (ref 1.5–2.5)

## 2017-08-05 ENCOUNTER — Encounter: Payer: Self-pay | Admitting: Internal Medicine

## 2017-08-05 MED ORDER — VITAMIN D (ERGOCALCIFEROL) 1.25 MG (50000 UNIT) PO CAPS: 50000.0000 [IU] | ORAL_CAPSULE | ORAL | 1 refills | Status: DC

## 2017-08-05 NOTE — Assessment & Plan Note (Signed)

## 2017-08-06 LAB — THYROID PANEL WITH TSH
FREE THYROXINE INDEX: 2.3 (ref 1.4–3.8)
T3 Uptake: 30 % (ref 22–35)
T4 TOTAL: 7.7 ug/dL (ref 4.9–10.5)
TSH: 3.61 mIU/L (ref 0.40–4.50)

## 2017-08-09 ENCOUNTER — Encounter: Payer: Self-pay | Admitting: Internal Medicine

## 2017-08-15 ENCOUNTER — Other Ambulatory Visit: Payer: Self-pay | Admitting: Internal Medicine

## 2017-08-22 ENCOUNTER — Other Ambulatory Visit: Payer: Self-pay | Admitting: *Deleted

## 2017-08-22 MED ORDER — METOPROLOL SUCCINATE ER 25 MG PO TB24: 25.0000 mg | ORAL_TABLET | Freq: Every day | ORAL | 2 refills | Status: DC

## 2017-08-22 MED ORDER — METOPROLOL SUCCINATE ER 25 MG PO TB24: 25.0000 mg | ORAL_TABLET | Freq: Every day | ORAL | 0 refills | Status: DC

## 2017-08-26 DIAGNOSIS — L820 Inflamed seborrheic keratosis: Secondary | ICD-10-CM | POA: Diagnosis not present

## 2017-08-26 DIAGNOSIS — D225 Melanocytic nevi of trunk: Secondary | ICD-10-CM | POA: Diagnosis not present

## 2017-08-26 DIAGNOSIS — Z85820 Personal history of malignant melanoma of skin: Secondary | ICD-10-CM | POA: Diagnosis not present

## 2017-08-26 DIAGNOSIS — L570 Actinic keratosis: Secondary | ICD-10-CM | POA: Diagnosis not present

## 2017-08-26 DIAGNOSIS — D1801 Hemangioma of skin and subcutaneous tissue: Secondary | ICD-10-CM | POA: Diagnosis not present

## 2017-08-26 DIAGNOSIS — L814 Other melanin hyperpigmentation: Secondary | ICD-10-CM | POA: Diagnosis not present

## 2017-08-26 DIAGNOSIS — Z85828 Personal history of other malignant neoplasm of skin: Secondary | ICD-10-CM | POA: Diagnosis not present

## 2017-08-26 DIAGNOSIS — D485 Neoplasm of uncertain behavior of skin: Secondary | ICD-10-CM | POA: Diagnosis not present

## 2017-08-26 DIAGNOSIS — D692 Other nonthrombocytopenic purpura: Secondary | ICD-10-CM | POA: Diagnosis not present

## 2017-08-26 DIAGNOSIS — D0462 Carcinoma in situ of skin of left upper limb, including shoulder: Secondary | ICD-10-CM | POA: Diagnosis not present

## 2017-08-26 DIAGNOSIS — L821 Other seborrheic keratosis: Secondary | ICD-10-CM | POA: Diagnosis not present

## 2017-09-08 DIAGNOSIS — R3915 Urgency of urination: Secondary | ICD-10-CM | POA: Diagnosis not present

## 2017-09-08 DIAGNOSIS — R351 Nocturia: Secondary | ICD-10-CM | POA: Diagnosis not present

## 2017-09-21 ENCOUNTER — Telehealth: Payer: Self-pay | Admitting: Internal Medicine

## 2017-09-21 NOTE — Telephone Encounter (Signed)
Copied from [place] [id number]. Topic: Quick Communication - See Telephone Encounter >> Sep 21, 2017  2:25 PM Boyd Kerbs wrote: CRM for notification. See Telephone encounter for: 09/21/17.  lubiprostone (AMITIZA) 24 MCG capsule he is saying having a lot of diarrhea every day about 30 minutes after taking this.  Asking if can change medications.   Please call into:  [facility] [place], [place] AT [address] [place] [address] Phone: [phone] Fax: [phone]

## 2017-09-22 NOTE — Telephone Encounter (Signed)
Contact patient for clarification.  Pt states that if he does not take then he is constipated and if he takes the amitiza then he has diarrhea.   Is there something that will move the bowels without the explosive diarrhea?

## 2017-09-24 ENCOUNTER — Other Ambulatory Visit: Payer: Self-pay | Admitting: Internal Medicine

## 2017-09-24 DIAGNOSIS — K5904 Chronic idiopathic constipation: Secondary | ICD-10-CM

## 2017-09-24 MED ORDER — PLECANATIDE 3 MG PO TABS: 1.0000 | ORAL_TABLET | Freq: Every day | ORAL | 1 refills | Status: DC

## 2017-09-24 NOTE — Telephone Encounter (Signed)
Try trulance RX sent I think we also have samples

## 2017-09-26 ENCOUNTER — Other Ambulatory Visit: Payer: Self-pay | Admitting: Cardiovascular Disease

## 2017-09-26 MED ORDER — CRESTOR 40 MG PO TABS: 40.0000 mg | ORAL_TABLET | Freq: Every day | ORAL | 2 refills | Status: DC

## 2017-09-26 NOTE — Telephone Encounter (Signed)
Pt informed of rx and sample up front for pick up.

## 2017-09-26 NOTE — Telephone Encounter (Signed)
Pt's medication was sent to pt's pharmacy as requested. Confirmation received.  °

## 2017-09-26 NOTE — Telephone Encounter (Signed)
New Message:         *STAT* If patient is at the pharmacy, call can be transferred to refill team.   1. Which medications need to be refilled? (please list name of each medication and dose if known) CRESTOR 40 MG tablet  2. Which pharmacy/location (including street and city if local pharmacy) is medication to be sent to?[facility] [place], [address] AT [address] [place]  3. Do they need a 30 day or 90 day supply? [place]

## 2017-09-27 ENCOUNTER — Telehealth: Payer: Self-pay

## 2017-09-27 ENCOUNTER — Other Ambulatory Visit: Payer: Self-pay

## 2017-09-27 ENCOUNTER — Other Ambulatory Visit: Payer: Self-pay | Admitting: *Deleted

## 2017-09-27 MED ORDER — ROSUVASTATIN CALCIUM 40 MG PO TABS: 40.0000 mg | ORAL_TABLET | Freq: Every day | ORAL | 0 refills | Status: DC

## 2017-09-27 MED ORDER — ROSUVASTATIN CALCIUM 40 MG PO TABS: 40.0000 mg | ORAL_TABLET | Freq: Every day | ORAL | 2 refills | Status: DC

## 2017-09-27 NOTE — Telephone Encounter (Signed)
RX FILLED 09-26-17 90 DAY 2 REFIILLS

## 2017-09-27 NOTE — Telephone Encounter (Signed)
The pt recently asked to have his Rosuvastatin changed to brand name Crestor. The pt has not tried and failed any other statins that we are aware of. I s/w him over the phone today. He states that he cannot remember why he was thinking that he has to have brand name Crestor and did not realize that according to his chart he has never tried and failed any other statin. He states that he is willing to go back on Rosuvastatin to see if he does have any problems with it and if he does he states that he will call us to let us know so we can document it in his chart.  Per his request I have sent a 30 day Rosuvastatin RX to [facility] to fill now and a 90 day Rosuvastatin RX to [facility].

## 2017-09-28 NOTE — Telephone Encounter (Signed)
Paper PA submitted for patient.

## 2017-10-19 NOTE — Telephone Encounter (Signed)
PA was not approved.

## 2017-11-11 ENCOUNTER — Telehealth: Payer: Self-pay | Admitting: Internal Medicine

## 2017-11-11 DIAGNOSIS — K5904 Chronic idiopathic constipation: Secondary | ICD-10-CM

## 2017-11-11 NOTE — Telephone Encounter (Signed)
Copied from [place] [id number]. Topic: Quick Communication - Rx Refill/Question >> Nov 11, 2017 10:14 AM Marin Olp L wrote: Medication: Plecanatide (TRULANCE) 3 MG TABS (taking for diarrhea, says it is working)  Has the patient contacted their pharmacy? Yes.   (Agent: If no, request that the patient contact the pharmacy for the refill.) (Agent: If yes, when and what did the pharmacy advise?)  Preferred Pharmacy (with phone number or street name): [facility] [place] [facility] [place], [address] AT [address] [place] [address] Phone: [phone] Fax: [phone]  Agent: Please be advised that RX refills may take up to 3 business days. We ask that you follow-up with your pharmacy.

## 2017-11-12 MED ORDER — PLECANATIDE 3 MG PO TABS: 1.0000 | ORAL_TABLET | Freq: Every day | ORAL | 0 refills | Status: DC

## 2017-11-12 NOTE — Telephone Encounter (Signed)
Medication previously sent to [facility]. Remaining refill sent to [facility] Dr as requested by the pt.

## 2017-12-15 NOTE — Progress Notes (Signed)
Patient ID: [name] [name], male   DOB: [birth year], 84 y.o.   MRN: [id number] 84 y.o. history of CAD. Stent to LAD in 2008.  ETT in 2013 abnormal with NSVT. Cath 05/01/12 30% ISR LaD and distal RCA 60-75% with FFR CT .92 Rx medically Myovue done 04/27/17 reviewed and normal with no ischemia study not gated due to PAC;s EF normal echo 06/25/15 55-60% LA only mildly dilated   Continues to have episodes of transient visual loss.  ? Occular migraines Been going on for over 3-4 years  Seen by neurology And MRI/MRA no abnormality and EEG no epileptic foci  01/11/14  1-39% bilateral carotid disease   Daughter in [place] has a hypochondriac for husband Daughter in [place] not working as [employer] but he likes her husband better Son works with him and may open a Administrator on [place] Has trip to [place] and [place] planned  No chest pain   ROS: Denies fever, malais, weight loss, blurry vision, decreased visual acuity, cough, sputum, SOB, hemoptysis, pleuritic pain, palpitaitons, heartburn, abdominal pain, melena, lower extremity edema, claudication, or rash.  All other systems reviewed and negative  General: BP 104/64   Pulse 78   Ht 5\' 10"  (1.778 m)   Wt 149 lb 8 oz (67.8 kg)   SpO2 94%   BMI 21.45 kg/m  Affect appropriate Healthy:  appears stated age 69: normal Neck supple with no adenopathy JVP normal no bruits no thyromegaly Lungs clear with no wheezing and good diaphragmatic motion Heart:  S1/S2 no murmur, no rub, gallop or click PMI normal Abdomen: benighn, BS positve, no tenderness, no AAA no bruit.  No HSM or HJR Distal pulses intact with no bruits No edema Neuro non-focal Skin warm and dry No muscular weakness     Current Outpatient Medications  Medication Sig Dispense Refill  . clobetasol (TEMOVATE) 0.05 % external solution APPLY ON THE SCALP NIGHTLY AS DIRECTED  0  . dutasteride (AVODART) 0.5 MG capsule Take 0.5 mg by mouth daily.    . fluticasone (FLONASE) 50 MCG/ACT  nasal spray USE 2 SPRAYS IN EACH NOSTRIL DAILY 32 g 11  . gabapentin (NEURONTIN) 100 MG capsule Take 2 capsules (200 mg total) by mouth at bedtime. 60 capsule 3  . ipratropium (ATROVENT) 0.03 % nasal spray USE 2 SPRAYS INTO BOTH NOSTRILS EVERY 12 HOURS 90 mL 2  . lisinopril (PRINIVIL,ZESTRIL) 10 MG tablet Take 1 tablet (10 mg total) by mouth daily. 90 tablet 3  . metoprolol succinate (TOPROL XL) 25 MG 24 hr tablet Take 1 tablet (25 mg total) by mouth daily. 30 tablet 0  . nitroGLYCERIN (NITROSTAT) 0.4 MG SL tablet Place 0.4 mg under the tongue every 5 (five) minutes as needed for chest pain (up to 3 doses only).    . Pancrelipase, Lip-Prot-Amyl, (ZENPEP) 40000-126000 units CPEP Take 1 capsule by mouth 4 (four) times daily -  before meals and at bedtime. 360 capsule 1  . pantoprazole (PROTONIX) 40 MG tablet Take 1 tablet (40 mg total) by mouth daily. 90 tablet 3  . rosuvastatin (CRESTOR) 40 MG tablet Take 1 tablet (40 mg total) by mouth daily. 90 tablet 2  . zolpidem (AMBIEN) 10 MG tablet TAKE 1 TABLET BY MOUTH AT BEDTIME AS NEEDED FOR SLEEP 30 tablet 3   No current facility-administered medications for this visit.     Allergies  Patient has no known allergies.  Electrocardiogram:  04/18/14  SR rate 47  ICRBBB old IMI possible nonspecific  ST T wave changes  no change from 2014  06/10/15  SR rate 62 PAC/PVC ICRBBB old IMI No afib.   02/18/16  SR ICRBBB old IMI  06/15/17 SR rte 37 RBBB   Assessment and Plan CAD: Stable with no angina and good activity level.  Continue medical Rx Stent LAD 2008  Moderate distal RCA with normal flow wire 04/2012  Non ischemic myovue 04/27/17  Chol: on generic crestor now  Lab Results  Component Value Date   LDLCALC 44 08/04/2017    GERD: improved on Protonix discussed low carb diet  Insmonia:  Chronic on ambien f/u Dr Ronnald Ramp  Visual Disturbance:  MRI/MRA normal EEG normal has been seen by neurology    F/U with me in 6 months   [name]

## 2017-12-19 ENCOUNTER — Encounter: Payer: Self-pay | Admitting: Cardiovascular Disease

## 2017-12-19 ENCOUNTER — Ambulatory Visit (INDEPENDENT_AMBULATORY_CARE_PROVIDER_SITE_OTHER): Payer: Medicare Other | Admitting: Cardiovascular Disease

## 2017-12-19 VITALS — BP 104/64 | HR 78 | Ht 70.0 in | Wt 149.5 lb

## 2017-12-19 DIAGNOSIS — I472 Ventricular tachycardia: Secondary | ICD-10-CM

## 2017-12-19 DIAGNOSIS — I2510 Atherosclerotic heart disease of native coronary artery without angina pectoris: Secondary | ICD-10-CM | POA: Diagnosis not present

## 2017-12-19 DIAGNOSIS — I4729 Other ventricular tachycardia: Secondary | ICD-10-CM

## 2017-12-19 NOTE — Patient Instructions (Signed)

## 2018-01-02 ENCOUNTER — Other Ambulatory Visit: Payer: Self-pay | Admitting: Internal Medicine

## 2018-01-02 DIAGNOSIS — E559 Vitamin D deficiency, unspecified: Secondary | ICD-10-CM

## 2018-01-04 DIAGNOSIS — L814 Other melanin hyperpigmentation: Secondary | ICD-10-CM | POA: Diagnosis not present

## 2018-01-04 DIAGNOSIS — L570 Actinic keratosis: Secondary | ICD-10-CM | POA: Diagnosis not present

## 2018-01-04 DIAGNOSIS — L821 Other seborrheic keratosis: Secondary | ICD-10-CM | POA: Diagnosis not present

## 2018-01-04 DIAGNOSIS — Z85820 Personal history of malignant melanoma of skin: Secondary | ICD-10-CM | POA: Diagnosis not present

## 2018-01-04 DIAGNOSIS — L738 Other specified follicular disorders: Secondary | ICD-10-CM | POA: Diagnosis not present

## 2018-01-04 DIAGNOSIS — Z85828 Personal history of other malignant neoplasm of skin: Secondary | ICD-10-CM | POA: Diagnosis not present

## 2018-01-10 ENCOUNTER — Ambulatory Visit: Payer: Medicare Other | Admitting: Family Medicine

## 2018-01-10 NOTE — Progress Notes (Signed)
[name] [address] [place], [place] [address] Phone: [phone] Subjective:    I'm seeing this patient by the request  of:    I Kana Grandville Silos am serving as a scribe for Dr. Hulan Saas.  CC: Left and right hip pain  [id number]  [name] [name] is a 84 y.o. male coming in with complaint of left hip pain. No numbness and tingling to the toes. Right hip is also painful. Left is worse than right. Walking is fine.   Onset- Chronic Location- lateral  Character- uncomfortable  Aggravating factors- Sleeping on the left side Reliving factors-when he does not sleep on that side. Therapies tried-changing positions when sleeping Severity-6 out of 10 because it can wake him up at night     Past Medical History:  Diagnosis Date  . Anxiety   . CAD (coronary artery disease)    a. s/p stent to LAD 2008;  b. abnormal ETT 11/13 with VTach => LHC pLAD 30% ISR, mCFX 30%, dRCA 60-75% which had progressed from previous study in 2010 => FFR of RCA 0.92 => med Rx   . Carotid stenosis    dopplers 1/14:  0-39% bilateral ICA stenosis  . Colon polyps   . Colon polyps    adenomatous  . Diverticular disease of colon   . Hemorrhoids    external and internal  . History of BPH   . HTN (hypertension)   . Hx of echocardiogram    a. Echo 9/11:  EF 55-60%, normal motion, grade 2 diastolic dysfunction, mild MR, mild LAE, mild RAE, PASP 33.  Marland Kitchen Hyperlipidemia   . IBS (irritable bowel syndrome)   . Rhinitis   . Stroke, lacunar ([place]) 08/24/2012   Old, 2011 head mri   Past Surgical History:  Procedure Laterality Date  . APPENDECTOMY  1956  . CORONARY STENT PLACEMENT  2008    Successful PCI of the lesion in the proximal LAD using a Promus   . HERNIA REPAIR Right 2005  . NASAL SINUS SURGERY  1975  . PERCUTANEOUS CORONARY STENT INTERVENTION (PCI-S) N/A 05/03/2012   Procedure: PERCUTANEOUS CORONARY STENT INTERVENTION (PCI-S);  Surgeon: Sherren Mocha, MD;  Location:  [facility] CATH LAB;  Service: Cardiovascular;  Laterality: N/A;  . Promus DES ok for 3T MRI  2008   Social History   Socioeconomic History  . Marital status: Married    Spouse name: Not on file  . Number of children: 3  . Years of education: 20  . Highest education level: Not on file  Occupational History  . Occupation: businessman, 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RETIRED  Social Needs  . Financial resource strain: Not hard at all  . Food insecurity:    Worry: Never true    Inability: Never true  . Transportation needs:    Medical: No    Non-medical: No  Tobacco Use  . Smoking status: Never Smoker  . Smokeless tobacco: Never Used  Substance and Sexual Activity  . Alcohol use: Yes    Comment: 4-6 drinks/week  . Drug use: No  . Sexual activity: Yes    Partners: Female  Lifestyle  . Physical activity:    Days per week: 4 days    Minutes per session: 40 min  . Stress: Not at all  Relationships  . Social connections:    Talks on phone: More than three times a week    Gets together: More than three times a week  Attends religious service: More than 4 times per year    Active member of club or organization: Yes    Attends meetings of clubs or organizations: More than 4 times per year    Relationship status: Married  Other Topics Concern  . 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 He has two daughters, 1 in [place] 1 in [place] (Dec '12) and a son who works with him. Several grandchildren.Reports that he spends a good deal of time at his home in [place] which he greatly enjoys and his home in the [place]. Enjoys driving his BMW convertible when in the [place].      ACP - Yes CPR, yes for short-term mechanical ventilation for reversible disease. Recommended [email or web address] for consideration.   No Known Allergies Family  History  Problem Relation Age of Onset  . Coronary artery disease Father   . Heart attack Father   . Rheum arthritis Mother   . Cancer Brother        ? TYPE  . Colon cancer Neg Hx      Current Outpatient Medications (Cardiovascular):  .  lisinopril (PRINIVIL,ZESTRIL) 10 MG tablet, Take 1 tablet (10 mg total) by mouth daily. .  metoprolol succinate (TOPROL XL) 25 MG 24 hr tablet, Take 1 tablet (25 mg total) by mouth daily. .  nitroGLYCERIN (NITROSTAT) 0.4 MG SL tablet, Place 0.4 mg under the tongue every 5 (five) minutes as needed for chest pain (up to 3 doses only). .  rosuvastatin (CRESTOR) 40 MG tablet, Take 1 tablet (40 mg total) by mouth daily.  Current Outpatient Medications (Respiratory):  .  fluticasone (FLONASE) 50 MCG/ACT nasal spray, USE 2 SPRAYS IN EACH NOSTRIL DAILY .  ipratropium (ATROVENT) 0.03 % nasal spray, USE 2 SPRAYS INTO BOTH NOSTRILS EVERY 12 HOURS    Current Outpatient Medications (Other):  .  clobetasol (TEMOVATE) 0.05 % external solution, APPLY ON THE SCALP NIGHTLY AS DIRECTED .  dutasteride (AVODART) 0.5 MG capsule, Take 0.5 mg by mouth daily. Marland Kitchen  gabapentin (NEURONTIN) 100 MG capsule, Take 2 capsules (200 mg total) by mouth at bedtime. .  Pancrelipase, Lip-Prot-Amyl, (ZENPEP) 40000-126000 units CPEP, Take 1 capsule by mouth 4 (four) times daily -  before meals and at bedtime. .  pantoprazole (PROTONIX) 40 MG tablet, Take 1 tablet (40 mg total) by mouth daily. .  Vitamin D, Ergocalciferol, (DRISDOL) 50000 units CAPS capsule, TAKE 1 CAPSULE EVERY 7 DAYS .  zolpidem (AMBIEN) 10 MG tablet, TAKE 1 TABLET BY MOUTH AT BEDTIME AS NEEDED FOR SLEEP    Past medical history, social, surgical and family history all reviewed in electronic medical record.  No pertanent information unless stated regarding to the chief complaint.   Review of Systems:  No headache, visual changes, nausea, vomiting, diarrhea, constipation, dizziness, abdominal pain, skin rash, fevers,  chills, night sweats, weight loss, swollen lymph nodes, body aches, joint swelling, chest pain, shortness of breath, mood changes.  Positive muscle aches  Objective  There were no vitals taken for this visit. Systems examined below as of    General: No apparent distress alert and oriented x3 mood and affect normal, dressed appropriately.  HEENT: Pupils equal, extraocular movements intact  Respiratory: Patient's speak in full sentences and does not appear short of breath  Cardiovascular: No lower extremity edema, non tender, no erythema  Skin: Warm dry intact with  no signs of infection or rash on extremities or on axial skeleton.  Abdomen: Soft nontender  Neuro: Cranial nerves II through XII are intact, neurovascularly intact in all extremities with 2+ DTRs and 2+ pulses.  Lymph: No lymphadenopathy of posterior or anterior cervical chain or axillae bilaterally.  Gait normal with good balance and coordination.  MSK:  Non tender with full range of motion and good stability and symmetric strength and tone of shoulders, elbows, wrist, , knee and ankles bilaterally.  Minimal arthritic changes Left hip exam has near full range of motion.  Positive Faber with pain over the greater trochanteric area on the left.  Mild greater trochanteric pain on the right.  Negative straight leg test.  Mild pain over the sacroiliac joints bilaterally.  Full strength neurovascularly intact distally   Procedure: Real-time Ultrasound Guided Injection of left  greater trochanteric bursitis secondary to patient's body habitus Device: GE Logiq Q7  Ultrasound guided injection is preferred based studies that show increased duration, increased effect, greater accuracy, decreased procedural pain, increased response rate, and decreased cost with ultrasound guided versus blind injection.  Verbal informed consent obtained.  Time-out conducted.  Noted no overlying erythema, induration, or other signs of local infection.  Skin  prepped in a sterile fashion.  Local anesthesia: Topical Ethyl chloride.  With sterile technique and under real time ultrasound guidance:  Greater trochanteric area was visualized and patient's bursa was noted. A 22-gauge 3 inch needle was inserted and 4 cc of 0.5% Marcaine and 1 cc of Kenalog 40 mg/dL was injected. Pictures taken Completed without difficulty  Pain immediately resolved suggesting accurate placement of the medication.  Advised to call if fevers/chills, erythema, induration, drainage, or persistent bleeding.  Images permanently stored and available for review in the ultrasound unit.  Impression: Technically successful ultrasound guided injection.    Impression and Recommendations:     This case required medical decision making of moderate complexity. The above documentation has been reviewed and is accurate and complete [name] Pulley, DO       Note: This dictation was prepared with Dragon dictation along with smaller phrase technology. Any transcriptional errors that result from this process are unintentional.

## 2018-01-11 ENCOUNTER — Ambulatory Visit (INDEPENDENT_AMBULATORY_CARE_PROVIDER_SITE_OTHER): Payer: Medicare Other | Admitting: Family Medicine

## 2018-01-11 ENCOUNTER — Encounter: Payer: Self-pay | Admitting: Family Medicine

## 2018-01-11 ENCOUNTER — Ambulatory Visit: Payer: Self-pay

## 2018-01-11 VITALS — BP 124/70 | HR 75 | Ht 70.0 in | Wt 150.0 lb

## 2018-01-11 DIAGNOSIS — M25552 Pain in left hip: Secondary | ICD-10-CM

## 2018-01-11 DIAGNOSIS — M7061 Trochanteric bursitis, right hip: Secondary | ICD-10-CM | POA: Insufficient documentation

## 2018-01-11 DIAGNOSIS — M7062 Trochanteric bursitis, left hip: Secondary | ICD-10-CM

## 2018-01-11 DIAGNOSIS — M25551 Pain in right hip: Secondary | ICD-10-CM

## 2018-01-11 DIAGNOSIS — I2510 Atherosclerotic heart disease of native coronary artery without angina pectoris: Secondary | ICD-10-CM

## 2018-01-11 NOTE — Assessment & Plan Note (Signed)
Patient given injection and tolerated the procedure well.  We discussed icing regimen and home exercises.  Discussed which activities to doing which wants to avoid.  Patient will increase activity slowly over the course the next several days.  Follow-up again in 4 to 8 weeks

## 2018-01-11 NOTE — Patient Instructions (Signed)
Good to see you  Patient's physical exam has been further evaluated and updated and is detailed for exam on @TODAY @ Ice 20 minutes 2 times daily. Usually after activity and before bed. Exercises 3 times a week.  Go mattress shopping  Stay active but ice after activity  See em again in 4 weeks to make sure it is gone

## 2018-01-30 ENCOUNTER — Ambulatory Visit: Payer: Medicare Other | Admitting: Family Medicine

## 2018-01-30 ENCOUNTER — Encounter

## 2018-02-09 ENCOUNTER — Ambulatory Visit: Payer: Medicare Other | Admitting: Family Medicine

## 2018-02-17 DIAGNOSIS — Z85820 Personal history of malignant melanoma of skin: Secondary | ICD-10-CM | POA: Diagnosis not present

## 2018-02-17 DIAGNOSIS — L821 Other seborrheic keratosis: Secondary | ICD-10-CM | POA: Diagnosis not present

## 2018-02-17 DIAGNOSIS — L570 Actinic keratosis: Secondary | ICD-10-CM | POA: Diagnosis not present

## 2018-02-17 DIAGNOSIS — D1801 Hemangioma of skin and subcutaneous tissue: Secondary | ICD-10-CM | POA: Diagnosis not present

## 2018-02-17 DIAGNOSIS — Z85828 Personal history of other malignant neoplasm of skin: Secondary | ICD-10-CM | POA: Diagnosis not present

## 2018-03-06 DIAGNOSIS — L570 Actinic keratosis: Secondary | ICD-10-CM | POA: Diagnosis not present

## 2018-03-30 ENCOUNTER — Ambulatory Visit (INDEPENDENT_AMBULATORY_CARE_PROVIDER_SITE_OTHER): Payer: Medicare Other | Admitting: Internal Medicine

## 2018-03-30 ENCOUNTER — Encounter: Payer: Self-pay | Admitting: Internal Medicine

## 2018-03-30 VITALS — BP 148/88 | HR 71 | Temp 98.3°F | Resp 16 | Ht 70.0 in | Wt 151.2 lb

## 2018-03-30 DIAGNOSIS — I10 Essential (primary) hypertension: Secondary | ICD-10-CM

## 2018-03-30 DIAGNOSIS — F40243 Fear of flying: Secondary | ICD-10-CM

## 2018-03-30 DIAGNOSIS — H53123 Transient visual loss, bilateral: Secondary | ICD-10-CM

## 2018-03-30 DIAGNOSIS — I2510 Atherosclerotic heart disease of native coronary artery without angina pectoris: Secondary | ICD-10-CM | POA: Diagnosis not present

## 2018-03-30 DIAGNOSIS — I472 Ventricular tachycardia: Secondary | ICD-10-CM

## 2018-03-30 DIAGNOSIS — H539 Unspecified visual disturbance: Secondary | ICD-10-CM

## 2018-03-30 DIAGNOSIS — I4729 Other ventricular tachycardia: Secondary | ICD-10-CM

## 2018-03-30 MED ORDER — DIAZEPAM 5 MG PO TABS: 5.0000 mg | ORAL_TABLET | Freq: Two times a day (BID) | ORAL | 2 refills | Status: DC | PRN

## 2018-03-30 NOTE — Patient Instructions (Signed)

## 2018-03-30 NOTE — Progress Notes (Signed)
Subjective:  Patient ID: [name] [name], male    DOB: 1932/07/19  Age: 84 y.o. MRN: [id number]  CC: Hypertension   HPI BREKKEN BEACH presents for a BP check - He tells me his blood pressure has been well controlled.  He denies any recent episodes of headache, blurred vision, CP, DOE, palpitations, edema, or fatigue.  He has a fear of flying.  He has successfully taken Valium for this in the past and today he requests a refill of the Valium.  Outpatient Medications Prior to Visit  Medication Sig Dispense Refill  . clobetasol (TEMOVATE) 0.05 % external solution APPLY ON THE SCALP NIGHTLY AS DIRECTED  0  . dutasteride (AVODART) 0.5 MG capsule Take 0.5 mg by mouth daily.    . fluticasone (FLONASE) 50 MCG/ACT nasal spray USE 2 SPRAYS IN EACH NOSTRIL DAILY 32 g 11  . gabapentin (NEURONTIN) 100 MG capsule Take 2 capsules (200 mg total) by mouth at bedtime. 60 capsule 3  . ipratropium (ATROVENT) 0.03 % nasal spray USE 2 SPRAYS INTO BOTH NOSTRILS EVERY 12 HOURS 90 mL 2  . lisinopril (PRINIVIL,ZESTRIL) 10 MG tablet Take 1 tablet (10 mg total) by mouth daily. 90 tablet 3  . metoprolol succinate (TOPROL XL) 25 MG 24 hr tablet Take 1 tablet (25 mg total) by mouth daily. 30 tablet 0  . nitroGLYCERIN (NITROSTAT) 0.4 MG SL tablet Place 0.4 mg under the tongue every 5 (five) minutes as needed for chest pain (up to 3 doses only).    . Pancrelipase, Lip-Prot-Amyl, (ZENPEP) 40000-126000 units CPEP Take 1 capsule by mouth 4 (four) times daily -  before meals and at bedtime. 360 capsule 1  . pantoprazole (PROTONIX) 40 MG tablet Take 1 tablet (40 mg total) by mouth daily. 90 tablet 3  . Vitamin D, Ergocalciferol, (DRISDOL) 50000 units CAPS capsule TAKE 1 CAPSULE EVERY 7 DAYS 12 capsule 1  . zolpidem (AMBIEN) 10 MG tablet TAKE 1 TABLET BY MOUTH AT BEDTIME AS NEEDED FOR SLEEP 30 tablet 2  . rosuvastatin (CRESTOR) 40 MG tablet Take 1 tablet (40 mg total) by mouth daily. 90 tablet 2   No facility-administered  medications prior to visit.     ROS Review of Systems  Constitutional: Negative.  Negative for diaphoresis and fatigue.  HENT: Negative.   Eyes: Negative for visual disturbance.  Respiratory: Negative for cough, chest tightness, shortness of breath and wheezing.   Cardiovascular: Negative.  Negative for chest pain, palpitations and leg swelling.  Gastrointestinal: Negative for abdominal pain, diarrhea, nausea and vomiting.  Genitourinary: Negative.  Negative for difficulty urinating.  Musculoskeletal: Negative for myalgias.  Skin: Negative.   Neurological: Negative.  Negative for dizziness, weakness, light-headedness and headaches.  Hematological: Negative for adenopathy. Does not bruise/bleed easily.  Psychiatric/Behavioral: Negative for dysphoric mood, sleep disturbance and suicidal ideas. The patient is nervous/anxious.     Objective:  BP (!) 148/88 (BP Location: Left Arm, Patient Position: Sitting, Cuff Size: Normal)   Pulse 71   Temp 98.3 F (36.8 C) (Oral)   Resp 16   Ht 5\' 10"  (1.778 m)   Wt 151 lb 4 oz (68.6 kg)   SpO2 96%   BMI 21.70 kg/m   BP Readings from Last 3 Encounters:  03/30/18 (!) 148/88  01/11/18 124/70  12/19/17 104/64    Wt Readings from Last 3 Encounters:  03/30/18 151 lb 4 oz (68.6 kg)  01/11/18 150 lb (68 kg)  12/19/17 149 lb 8 oz (67.8 kg)  Physical Exam  Constitutional: He is oriented to person, place, and time. No distress.  HENT:  Mouth/Throat: Oropharynx is clear and moist. No oropharyngeal exudate.  Eyes: Conjunctivae are normal.  Neck: Normal range of motion. Neck supple. No thyromegaly present.  Cardiovascular: Normal rate, regular rhythm and normal heart sounds.  No murmur heard. Pulmonary/Chest: Effort normal and breath sounds normal. He has no wheezes. He has no rales.  Abdominal: Soft. Bowel sounds are normal. He exhibits no mass. There is no hepatosplenomegaly. There is no tenderness.  Musculoskeletal: Normal range of motion.  He exhibits no edema, tenderness or deformity.  Neurological: He is alert and oriented to person, place, and time.  Skin: Skin is warm and dry. No rash noted. He is not diaphoretic.  Psychiatric: He has a normal mood and affect. His behavior is normal. Judgment and thought content normal.  Vitals reviewed.   Lab Results  Component Value Date   WBC 6.7 08/04/2017   HGB 14.5 08/04/2017   HCT 42.5 08/04/2017   PLT 196.0 08/04/2017   GLUCOSE 98 08/04/2017   CHOL 132 08/04/2017   TRIG 128.0 08/04/2017   HDL 62.00 08/04/2017   LDLDIRECT 132.2 03/10/2007   LDLCALC 44 08/04/2017   ALT 15 08/04/2017   AST 18 08/04/2017   NA 141 08/04/2017   K 4.1 08/04/2017   CL 107 08/04/2017   CREATININE 1.07 08/04/2017   BUN 14 08/04/2017   CO2 25 08/04/2017   TSH 3.61 08/04/2017   PSA 0.44 05/10/2011   INR 1.0 04/27/2012    Ct Abdomen Pelvis W Contrast  Result Date: 12/30/2016 CLINICAL DATA:  Epigastric tenderness, some rebound, abdominal bloating, lack of appetite EXAM: CT ABDOMEN AND PELVIS WITH CONTRAST TECHNIQUE: Multidetector CT imaging of the abdomen and pelvis was performed using the standard protocol following bolus administration of intravenous contrast. CONTRAST:  179mL ISOVUE-300 IOPAMIDOL (ISOVUE-300) INJECTION 61% COMPARISON:  None. FINDINGS: Lower chest: The lung bases are clear. The heart is within normal limits in size for age. No pericardial effusion is seen. There may be a small hiatal hernia present. Hepatobiliary: The single well defined low-attenuation structure is noted in the left lobe of liver most likely benign. No other hepatic abnormality is seen. Pancreas: The pancreas is normal in size and the pancreatic duct is not dilated. Spleen: The spleen is unremarkable. Adrenals/Urinary Tract: The adrenal glands appear normal. The kidneys enhance with no evidence of mass or calculi. A cyst emanates from the upper pole of the right kidney measuring 2.6 cm diameter. On delayed images the  pelvocaliceal systems appear normal. Small low-attenuation renal structures are present bilaterally most consistent with small renal cysts. The ureters appear normal in caliber. The urinary bladder is slightly urine distended and there is indentation by and enlarged prostate present. A right Hutch diverticulum is present as well. The bladder wall is somewhat thick wall which may indicate a degree of bladder outlet obstruction. Stomach/Bowel: The stomach is decompressed. No small bowel distention is seen. There are rectosigmoid colon diverticula present. No diverticulitis is noted. Some contrast and feces is noted throughout the colon. No obvious colonic lesion is noted. The terminal ileum is unremarkable. No inflammatory process is seen within the right lower quadrant. Vascular/Lymphatic: The abdominal aorta is normal in caliber with mild abdominal aortic atherosclerosis noted. No adenopathy is seen. Reproductive: The prostate is enlarged measuring 4.1 x 4.6 cm per Other: None. Musculoskeletal: Degenerative disc disease is noted primarily at L5-S1. IMPRESSION: 1. No explanation for the patient's abdominal  tenderness is seen. 2. Slightly thick-walled urinary bladder may be due to a degree of bladder outlet obstruction. Small right Hutch diverticulum. 3. Rectosigmoid colon diverticula.  No evidence of diverticulitis. 4. Prominent prostate. Electronically Signed   By: Ivar Drape M.D.   On: 12/30/2016 11:37    Assessment & Plan:   Zavon was seen today for hypertension.  Diagnoses and all orders for this visit:  Essential hypertension- His BP is adequately well controlled.  Fear of flying -     diazepam (VALIUM) 5 MG tablet; Take 1 tablet (5 mg total) by mouth every 12 (twelve) hours as needed for anxiety.  Visual disturbances  NSVT (nonsustained ventricular tachycardia) (HCC)  Transient visual loss, bilateral   I have changed Macky Lower "Cooper"'s diazepam. I am also having him maintain his  dutasteride, nitroGLYCERIN, pantoprazole, fluticasone, clobetasol, ipratropium, gabapentin, lisinopril, Pancrelipase (Lip-Prot-Amyl), metoprolol succinate, rosuvastatin, Vitamin D (Ergocalciferol), and zolpidem.  Meds ordered this encounter  Medications  . diazepam (VALIUM) 5 MG tablet    Sig: Take 1 tablet (5 mg total) by mouth every 12 (twelve) hours as needed for anxiety.    Dispense:  20 tablet    Refill:  2     Follow-up: Return in about 3 months (around 06/30/2018).  Scarlette Calico, MD

## 2018-04-10 ENCOUNTER — Other Ambulatory Visit: Payer: Self-pay | Admitting: Internal Medicine

## 2018-04-11 ENCOUNTER — Encounter: Payer: Self-pay | Admitting: Internal Medicine

## 2018-04-11 ENCOUNTER — Ambulatory Visit (INDEPENDENT_AMBULATORY_CARE_PROVIDER_SITE_OTHER): Payer: Medicare Other | Admitting: Internal Medicine

## 2018-04-11 VITALS — BP 136/72 | HR 68 | Ht 70.0 in | Wt 150.0 lb

## 2018-04-11 DIAGNOSIS — K5900 Constipation, unspecified: Secondary | ICD-10-CM

## 2018-04-11 DIAGNOSIS — K219 Gastro-esophageal reflux disease without esophagitis: Secondary | ICD-10-CM

## 2018-04-11 DIAGNOSIS — I2510 Atherosclerotic heart disease of native coronary artery without angina pectoris: Secondary | ICD-10-CM | POA: Diagnosis not present

## 2018-04-11 NOTE — Patient Instructions (Signed)
Take one dose of Miralax daily, adjust to achieve desire effect.

## 2018-04-11 NOTE — Progress Notes (Signed)
HISTORY OF PRESENT ILLNESS:  [name] [name] is a 84 y.o. male past medical history as listed below who presents today with a chief complaint of constipation.  I last saw the patient in this office October 26, 2013 regarding functional constipation.  See that dictation for details.  Patient tells me today that he has had difficulty with his bowels over the past 11 months.  He will go 3 days without a bowel movement and the need to take either a laxative or other stimulant agent.  He prefers Dulcolax.  Thereafter he will have 3 or 4 bowel movements that particular day.  Occasional urgency and loose bowels but no incontinence.  He does take Metamucil on a daily basis as well.  No bleeding.  Review of blood work from March 2019 finds unremarkable comprehensive metabolic panel and CBC with hemoglobin 14.5.  Last CT scan of the abdomen August 2018 revealed no significant abnormalities.  Incidental diverticulosis noted.  His last complete colonoscopy was April 2015.  Diminutive adenoma removed.  No future follow-up advised due to his age.  He has had 3 additional colonoscopies prior to that exam where it patient denies rectal bleeding.  No weight loss.  No associated abdominal pain.  No nocturnal symptoms.  He does take pantoprazole for his GERD.  This control symptoms.  No dysphasia.  Last seen by the GI physician assistant February 2018 regarding epigastric pain.  REVIEW OF SYSTEMS:  All non-GI ROS negative unless otherwise stated in the HPI except for anxiety  Past Medical History:  Diagnosis Date  . Anxiety   . CAD (coronary artery disease)    a. s/p stent to LAD 2008;  b. abnormal ETT 11/13 with VTach => LHC pLAD 30% ISR, mCFX 30%, dRCA 60-75% which had progressed from previous study in 2010 => FFR of RCA 0.92 => med Rx   . Carotid stenosis    dopplers 1/14:  0-39% bilateral ICA stenosis  . Colon polyps   . Colon polyps    adenomatous  . Diverticular disease of colon   . Hemorrhoids    external and  internal  . History of BPH   . HTN (hypertension)   . Hx of echocardiogram    a. Echo 9/11:  EF 55-60%, normal motion, grade 2 diastolic dysfunction, mild MR, mild LAE, mild RAE, PASP 33.  Marland Kitchen Hyperlipidemia   . IBS (irritable bowel syndrome)   . Rhinitis   . Stroke, lacunar ([place]) 08/24/2012   Old, 2011 head mri    Past Surgical History:  Procedure Laterality Date  . APPENDECTOMY  1956  . CORONARY STENT PLACEMENT  2008    Successful PCI of the lesion in the proximal LAD using a Promus   . HERNIA REPAIR Right 2005  . NASAL SINUS SURGERY  1975  . PERCUTANEOUS CORONARY STENT INTERVENTION (PCI-S) N/A 05/03/2012   Procedure: PERCUTANEOUS CORONARY STENT INTERVENTION (PCI-S);  Surgeon: Sherren Mocha, MD;  Location: [facility] CATH LAB;  Service: Cardiovascular;  Laterality: N/A;  . Promus DES ok for 3T MRI  2008    Social History BLAYN WHETSELL  reports that he has never smoked. He has never used smokeless tobacco. He reports that he drinks alcohol. He reports that he does not use drugs.  family history includes Cancer in his brother; Coronary artery disease in his father; Heart attack in his father; Rheum arthritis in his mother.  No Known Allergies     PHYSICAL EXAMINATION: Vital signs: BP 136/72   Pulse  68   Ht 5\' 10"  (1.778 m)   Wt 150 lb (68 kg)   BMI 21.52 kg/m   Constitutional: generally well-appearing, no acute distress Psychiatric: alert and oriented x3, cooperative Eyes: extraocular movements intact, anicteric, conjunctiva pink Mouth: oral pharynx moist, no lesions Neck: supple no lymphadenopathy Cardiovascular: heart regular rate and rhythm, no murmur Lungs: clear to auscultation bilaterally Abdomen: soft, nontender, nondistended, no obvious ascites, no peritoneal signs, normal bowel sounds, no organomegaly Rectal: Omitted Extremities: no, cyanosis, or lower extremity edema bilaterally Skin: no lesions on visible extremities Neuro: No focal deficits.  Cranial nerves  intact  ASSESSMENT:  1.  Functional constipation 2.  History of adenomatous colon polyps.  Aged out of surveillance 3.  Incidental diverticulosis 4.  GERD requiring PPI to control symptoms   PLAN:  1.  Introduce MiraLAX.  Start with 1 dose daily.  Adjust to achieve desired result. 2.  Okay to use Dulcolax as a rescue agent 3.  Continue PPI 4.  Contact the office for questions or problems.  Otherwise follow-up as needed  25-minute spent face-to-face with the patient.  Greater than 50% the time used for counseling regarding his bowel habit issues and answering a multitude of questions as well as explaining the treatment regimen.

## 2018-04-25 DIAGNOSIS — H26491 Other secondary cataract, right eye: Secondary | ICD-10-CM | POA: Diagnosis not present

## 2018-04-25 DIAGNOSIS — H2512 Age-related nuclear cataract, left eye: Secondary | ICD-10-CM | POA: Diagnosis not present

## 2018-05-04 DIAGNOSIS — H26491 Other secondary cataract, right eye: Secondary | ICD-10-CM | POA: Diagnosis not present

## 2018-05-28 ENCOUNTER — Other Ambulatory Visit: Payer: Self-pay | Admitting: Internal Medicine

## 2018-05-28 DIAGNOSIS — E559 Vitamin D deficiency, unspecified: Secondary | ICD-10-CM

## 2018-06-26 DIAGNOSIS — G5602 Carpal tunnel syndrome, left upper limb: Secondary | ICD-10-CM | POA: Diagnosis not present

## 2018-06-29 NOTE — Progress Notes (Signed)
Patient ID: [name] [name], male   DOB: February 21, 1933, 85 y.o.   MRN: [id number]     85 y.o. history of CAD. Stent to LAD in 2008.  ETT in 2013 abnormal with NSVT. Cath 05/01/12 30% ISR LaD and distal RCA 60-75% with FFR CT .92 Rx medically Myovue done 04/27/17 reviewed and normal with no ischemia study not gated due to PAC;s EF normal echo 06/25/15 55-60% LA only mildly dilated   Continues to have episodes of transient visual loss.  ? Occular migraines Been going on for over 3-4 years  Seen by neurology And MRI/MRA no abnormality and EEG no epileptic foci  01/11/14  1-39% bilateral carotid disease   Daughter in [place] has a hypochondriac for husband Daughter in [place] not working as [employer] but he likes her husband better Son works with him and may open a Administrator on [place] Had a nice  trip to [place] and [place]    No chest pain  One daughter from [place] is doing holistic health in [place] and will be in [employer] for a month or two   ROS: Denies fever, malais, weight loss, blurry vision, decreased visual acuity, cough, sputum, SOB, hemoptysis, pleuritic pain, palpitaitons, heartburn, abdominal pain, melena, lower extremity edema, claudication, or rash.  All other systems reviewed and negative  General: BP 128/64   Pulse 80   Ht 5\' 10"  (1.778 m)   Wt 68.5 kg   SpO2 95%   BMI 21.68 kg/m  Affect appropriate Healthy:  appears stated age 85: normal Neck supple with no adenopathy JVP normal no bruits no thyromegaly Lungs clear with no wheezing and good diaphragmatic motion Heart:  S1/S2 no murmur, no rub, gallop or click PMI normal Abdomen: benighn, BS positve, no tenderness, no AAA no bruit.  No HSM or HJR Distal pulses intact with no bruits No edema Neuro non-focal Skin warm and dry No muscular weakness     Current Outpatient Medications  Medication Sig Dispense Refill  . diazepam (VALIUM) 5 MG tablet Take 1 tablet (5 mg total) by mouth every 12 (twelve) hours as needed for  anxiety. 20 tablet 2  . dutasteride (AVODART) 0.5 MG capsule Take 0.5 mg by mouth daily.    . fluticasone (FLONASE) 50 MCG/ACT nasal spray USE 2 SPRAYS IN EACH NOSTRIL DAILY 32 g 11  . gabapentin (NEURONTIN) 100 MG capsule Take 2 capsules (200 mg total) by mouth at bedtime. 60 capsule 3  . ipratropium (ATROVENT) 0.03 % nasal spray USE 2 SPRAYS INTO BOTH NOSTRILS EVERY 12 HOURS 90 mL 2  . lisinopril (PRINIVIL,ZESTRIL) 10 MG tablet Take 1 tablet (10 mg total) by mouth daily. 90 tablet 3  . metoprolol succinate (TOPROL XL) 25 MG 24 hr tablet Take 1 tablet (25 mg total) by mouth daily. 30 tablet 0  . nitroGLYCERIN (NITROSTAT) 0.4 MG SL tablet Place 0.4 mg under the tongue every 5 (five) minutes as needed for chest pain (up to 3 doses only).    . Pancrelipase, Lip-Prot-Amyl, (ZENPEP) 40000-126000 units CPEP Take 1 capsule by mouth 4 (four) times daily -  before meals and at bedtime. 360 capsule 1  . pantoprazole (PROTONIX) 40 MG tablet Take 1 tablet (40 mg total) by mouth daily. 90 tablet 3  . Vitamin D, Ergocalciferol, (DRISDOL) 1.25 MG (50000 UT) CAPS capsule TAKE 1 CAPSULE EVERY 7 DAYS 12 capsule 1  . zolpidem (AMBIEN) 10 MG tablet TAKE 1 TABLET BY MOUTH AT BEDTIME AS NEEDED FOR SLEEP  30 tablet 2  . rosuvastatin (CRESTOR) 40 MG tablet Take 1 tablet (40 mg total) by mouth daily. 90 tablet 2   No current facility-administered medications for this visit.     Allergies  Patient has no known allergies.  Electrocardiogram:  04/18/14  SR rate 53  ICRBBB old IMI possible nonspecific ST T wave changes  no change from 2014  06/10/15  SR rate 5 PAC/PVC ICRBBB old IMI No afib.   02/18/16  SR ICRBBB old IMI  06/15/17 SR rte 62 RBBB   Assessment and Plan  CAD: Stable with no angina and good activity level.  Continue medical Rx Stent LAD 2008  Moderate distal RCA with normal flow wire 04/2012  Non ischemic myovue 04/27/17   HLD : on generic crestor now  Lab Results  Component Value Date   LDLCALC 44  08/04/2017    GERD: improved on Protonix discussed low carb diet   Insmonia:  Chronic on ambien f/u Dr Ronnald Ramp   Visual Disturbance:  MRI/MRA normal EEG normal has been seen by neurology    F/U with me in 6 months   [name]

## 2018-07-05 ENCOUNTER — Encounter: Payer: Self-pay | Admitting: Cardiovascular Disease

## 2018-07-05 ENCOUNTER — Ambulatory Visit (INDEPENDENT_AMBULATORY_CARE_PROVIDER_SITE_OTHER): Payer: Medicare Other | Admitting: Cardiovascular Disease

## 2018-07-05 VITALS — BP 128/64 | HR 80 | Ht 70.0 in | Wt 151.1 lb

## 2018-07-05 DIAGNOSIS — I2510 Atherosclerotic heart disease of native coronary artery without angina pectoris: Secondary | ICD-10-CM | POA: Diagnosis not present

## 2018-07-05 DIAGNOSIS — E785 Hyperlipidemia, unspecified: Secondary | ICD-10-CM | POA: Diagnosis not present

## 2018-07-05 NOTE — Patient Instructions (Signed)

## 2018-07-07 ENCOUNTER — Other Ambulatory Visit: Payer: Self-pay | Admitting: Cardiovascular Disease

## 2018-07-07 ENCOUNTER — Telehealth: Payer: Self-pay | Admitting: Internal Medicine

## 2018-07-07 MED ORDER — PLECANATIDE 3 MG PO TABS: 1.0000 | ORAL_TABLET | Freq: Every day | ORAL | 0 refills | Status: DC | PRN

## 2018-07-07 NOTE — Telephone Encounter (Signed)
Copied from [place] [id number]. Topic: Quick Communication - Rx Refill/Question >> Jul 07, 2018  2:27 PM Rayann Heman wrote: Medication: Trulance 3mg . Did not see medication in list. Pt states that he uses this for constipation   Has the patient contacted their pharmacy? No Preferred Pharmacy (with phone number or street name): [place], [place] [phone] (Phone) [phone] (Fax)    Agent: Please be advised that RX refills may take up to 3 business days. We ask that you follow-up with your pharmacy.

## 2018-07-07 NOTE — Telephone Encounter (Signed)
Pt requesting medication for constipation. Not an active med and cannot find ordering provider/medication upon chart review.

## 2018-07-07 NOTE — Telephone Encounter (Signed)
erx sent to mail order as requested.

## 2018-07-07 NOTE — Addendum Note (Signed)
Addended by: Karle Barr on: 07/07/2018 04:48 PM   Modules accepted: Orders

## 2018-07-12 DIAGNOSIS — H2512 Age-related nuclear cataract, left eye: Secondary | ICD-10-CM | POA: Diagnosis not present

## 2018-07-13 ENCOUNTER — Other Ambulatory Visit: Payer: Self-pay

## 2018-07-13 MED ORDER — LISINOPRIL 10 MG PO TABS: 10.0000 mg | ORAL_TABLET | Freq: Every day | ORAL | 0 refills | Status: DC

## 2018-07-14 ENCOUNTER — Encounter: Payer: Self-pay | Admitting: Physician Assistant

## 2018-07-14 ENCOUNTER — Ambulatory Visit (INDEPENDENT_AMBULATORY_CARE_PROVIDER_SITE_OTHER): Payer: Medicare Other | Admitting: Physician Assistant

## 2018-07-14 VITALS — BP 108/70 | HR 82 | Ht 71.0 in | Wt 152.0 lb

## 2018-07-14 DIAGNOSIS — K5900 Constipation, unspecified: Secondary | ICD-10-CM | POA: Diagnosis not present

## 2018-07-14 DIAGNOSIS — I2510 Atherosclerotic heart disease of native coronary artery without angina pectoris: Secondary | ICD-10-CM | POA: Diagnosis not present

## 2018-07-14 NOTE — Progress Notes (Signed)
Chief Complaint: Diarrhea alternating with constipation  HPI:    [name] [name] is an 85 year old male with a past medical history as listed below, known to Dr. Henrene Pastor, who presents to clinic today with a complaint of diarrhea which alternates with constipation.    04/11/2018 office visit with Dr. Henrene Pastor for constipation.  At that time described difficulty with his bowels over the past 11 months going 3 days without a bowel movement and then needing to take a laxative or stimulant, preferring Dulcolax. CT scan of the abdomen August 28 revealed no significant abnormalities with incidental diverticulosis.  Last complete colonoscopy April 2015 with diminutive adenoma removed and no future follow-up advise due to his age.  Diagnosed with functional constipation, told to introduce MiraLAX and start with 1 dose daily and adjust to achieve desired results.  Also okayed to use Dulcolax as a rescue agent.    Today, patient tells me that really he is still having issues with chronic constipation.  Currently has been given samples of Trulance by his PCP.  Tells me he takes these every other day and will have an explosive bowel movement, possibly 2 or 3 explosive bowel movements, around 10 AM after taking this in the morning.  Patient is not happy with these results and would like to be on something which works better for him.  Describes trying MiraLAX per recommendations from Dr. Henrene Pastor after last visit but was only ever taking 1 capful a day.    Denies fever, chills, weight loss, nausea, vomiting, heartburn, reflux or blood in his stool.  Past Medical History:  Diagnosis Date  . Anxiety   . CAD (coronary artery disease)    a. s/p stent to LAD 2008;  b. abnormal ETT 11/13 with VTach => LHC pLAD 30% ISR, mCFX 30%, dRCA 60-75% which had progressed from previous study in 2010 => FFR of RCA 0.92 => med Rx   . Carotid stenosis    dopplers 1/14:  0-39% bilateral ICA stenosis  . Colon polyps   . Colon polyps    adenomatous  . Diverticular disease of colon   . Hemorrhoids    external and internal  . History of BPH   . HTN (hypertension)   . Hx of echocardiogram    a. Echo 9/11:  EF 55-60%, normal motion, grade 2 diastolic dysfunction, mild MR, mild LAE, mild RAE, PASP 33.  Marland Kitchen Hyperlipidemia   . IBS (irritable bowel syndrome)   . Rhinitis   . Stroke, lacunar ([place]) 08/24/2012   Old, 2011 head mri    Past Surgical History:  Procedure Laterality Date  . APPENDECTOMY  1956  . CORONARY STENT PLACEMENT  2008    Successful PCI of the lesion in the proximal LAD using a Promus   . HERNIA REPAIR Right 2005  . NASAL SINUS SURGERY  1975  . PERCUTANEOUS CORONARY STENT INTERVENTION (PCI-S) N/A 05/03/2012   Procedure: PERCUTANEOUS CORONARY STENT INTERVENTION (PCI-S);  Surgeon: Sherren Mocha, MD;  Location: [facility] CATH LAB;  Service: Cardiovascular;  Laterality: N/A;  . Promus DES ok for 3T MRI  2008    Current Outpatient Medications  Medication Sig Dispense Refill  . diazepam (VALIUM) 5 MG tablet Take 1 tablet (5 mg total) by mouth every 12 (twelve) hours as needed for anxiety. 20 tablet 2  . dutasteride (AVODART) 0.5 MG capsule Take 0.5 mg by mouth daily.    . fluticasone (FLONASE) 50 MCG/ACT nasal spray USE 2 SPRAYS IN EACH NOSTRIL DAILY 32 g  11  . gabapentin (NEURONTIN) 100 MG capsule Take 2 capsules (200 mg total) by mouth at bedtime. 60 capsule 3  . ipratropium (ATROVENT) 0.03 % nasal spray USE 2 SPRAYS INTO BOTH NOSTRILS EVERY 12 HOURS 90 mL 2  . lisinopril (PRINIVIL,ZESTRIL) 10 MG tablet Take 1 tablet (10 mg total) by mouth daily. 7 tablet 0  . metoprolol succinate (TOPROL XL) 25 MG 24 hr tablet Take 1 tablet (25 mg total) by mouth daily. 30 tablet 0  . nitroGLYCERIN (NITROSTAT) 0.4 MG SL tablet Place 0.4 mg under the tongue every 5 (five) minutes as needed for chest pain (up to 3 doses only).    . Pancrelipase, Lip-Prot-Amyl, (ZENPEP) 40000-126000 units CPEP Take 1 capsule by mouth 4 (four) times  daily -  before meals and at bedtime. 360 capsule 1  . pantoprazole (PROTONIX) 40 MG tablet Take 1 tablet (40 mg total) by mouth daily. 90 tablet 3  . Plecanatide (TRULANCE) 3 MG TABS Take 1 tablet by mouth daily as needed. 30 tablet 0  . Vitamin D, Ergocalciferol, (DRISDOL) 1.25 MG (50000 UT) CAPS capsule TAKE 1 CAPSULE EVERY 7 DAYS 12 capsule 1  . zolpidem (AMBIEN) 10 MG tablet TAKE 1 TABLET BY MOUTH AT BEDTIME AS NEEDED FOR SLEEP 30 tablet 2  . rosuvastatin (CRESTOR) 40 MG tablet Take 1 tablet (40 mg total) by mouth daily. 90 tablet 2   No current facility-administered medications for this visit.     Allergies as of 07/14/2018  . (No Known Allergies)    Family History  Problem Relation Age of Onset  . Coronary artery disease Father   . Heart attack Father   . Rheum arthritis Mother   . Cancer Brother        ? TYPE  . Colon cancer Neg Hx   . Stomach cancer Neg Hx   . Pancreatic cancer Neg Hx     Social History   Socioeconomic History  . Marital status: Married    Spouse name: Not on file  . Number of children: 3  . Years of education: 22  . Highest education level: Not on file  Occupational History  . Occupation: businessman, 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RETIRED  Social Needs  . Financial resource strain: Not hard at all  . Food insecurity:    Worry: Never true    Inability: Never true  . Transportation needs:    Medical: No    Non-medical: No  Tobacco Use  . Smoking status: Never Smoker  . Smokeless tobacco: Never Used  Substance and Sexual Activity  . Alcohol use: Yes    Comment: 4-6 drinks/week  . Drug use: No  . Sexual activity: Yes    Partners: Female  Lifestyle  . Physical activity:    Days per week: 4 days    Minutes per session: 40 min  . Stress: Not at all  Relationships  . Social connections:    Talks on phone: More than three times a week    Gets together: More than three times a week    Attends religious service: More than 4 times per  year    Active member of club or organization: Yes    Attends meetings of clubs or organizations: More than 4 times per year    Relationship status: Married  . Intimate partner violence:    Fear of current or ex partner: No    Emotionally abused: No    Physically abused: No  Forced sexual activity: No  Other Topics Concern  . 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 He has two daughters, 1 in [place] 1 in [place] (Dec '12) and a son who works with him. Several grandchildren.Reports that he spends a good deal of time at his home in [place] which he greatly enjoys and his home in the [place]. Enjoys driving his BMW convertible when [place] the [place].      ACP - Yes CPR, yes for short-term mechanical ventilation for reversible disease. Recommended [email or web address] for consideration.    Review of Systems:    Constitutional: No weight loss, fever or chills Cardiovascular: No chest pain   Respiratory: No SOB  Gastrointestinal: See HPI and otherwise negative   Physical Exam:  Vital signs: BP 108/70   Pulse 82   Ht 5\' 11"  (1.803 m)   Wt 152 lb (68.9 kg)   BMI 21.20 kg/m   Constitutional:   Pleasant Elderly Caucasian male appears to be in NAD, Well developed, Well nourished, alert and cooperative Respiratory: Respirations even and unlabored. Lungs clear to auscultation bilaterally.   No wheezes, crackles, or rhonchi.  Cardiovascular: Normal S1, S2. No MRG. Regular rate and rhythm. No peripheral edema, cyanosis or pallor.  Gastrointestinal:  Soft, nondistended, nontender. No rebound or guarding. Normal bowel sounds. No appreciable masses or hepatomegaly. Psychiatric: Demonstrates good judgement and reason without abnormal affect or behaviors.  No recent labs or imaging.  Assessment: 1.  Constipation: Patient is not happy with results  from Trulance which gives him urgent loose stools after taking it every other day, previously tried MiraLAX once daily which did not help at all  Plan: 1.  Discussed titration of MiraLAX.  Discussed that he can take up to 4 times a day, would recommend he start taking this twice daily for now and titrate up as needed. 2.  Discontinue Trulance which is too strong for him.  Did discuss that if he gets constipated on the regimen above he can use Trulance as a rescue medication for now. 3.  Request the patient call our clinic in 2 weeks to let us know if this is not working for him.  At that point would recommend that he start Linzess 72 mcg daily instead of MiraLAX.  Did provide him with some samples today in case he needs them.  If this works well for him then we can adjust in the future. 4.  Patient to follow in clinic as needed with Dr. Henrene Pastor or myself.  [name] Newer, PA-C [facility] 07/14/2018, 9:52 AM  Cc: [name], MD

## 2018-07-14 NOTE — Patient Instructions (Signed)
If you are age 83 or older, your body mass index should be between 23-30. Your Body mass index is 21.2 kg/m. If this is out of the aforementioned range listed, please consider follow up with your Primary Care Provider.  If you are age 6 or younger, your body mass index should be between 19-25. Your Body mass index is 21.2 kg/m. If this is out of the aformentioned range listed, please consider follow up with your Primary Care Provider.   STOP TRULANCE.  Increase Miralax to twice daily.  Can use up to four times daily.    We have given you samples of Linzess if Miralax doesn't work.  Call in two weeks if not helping.  Thank you for choosing me and [facility].    Ellouise Newer, PA-C

## 2018-07-17 DIAGNOSIS — H1131 Conjunctival hemorrhage, right eye: Secondary | ICD-10-CM | POA: Diagnosis not present

## 2018-07-17 NOTE — Progress Notes (Signed)
Reviewed note

## 2018-08-06 ENCOUNTER — Encounter: Payer: Self-pay | Admitting: Internal Medicine

## 2018-08-08 ENCOUNTER — Other Ambulatory Visit: Payer: Self-pay | Admitting: Internal Medicine

## 2018-08-08 ENCOUNTER — Ambulatory Visit: Payer: Medicare Other | Admitting: Internal Medicine

## 2018-08-08 ENCOUNTER — Ambulatory Visit: Payer: Medicare Other

## 2018-08-17 ENCOUNTER — Ambulatory Visit: Payer: Medicare Other | Admitting: Internal Medicine

## 2018-09-12 NOTE — Progress Notes (Addendum)
Subjective:   [name] [name] is a 85 y.o. male who presents for Medicare Annual/Subsequent preventive examination.  I connected with patient 09/13/18 at 11:00 AM EDT by a video enabled telemedicine application and verified that I am speaking with the correct person using two identifiers. Patient stated full name and DOB. Patient gave permission to continue with virtual visit. Patient's location was at home and Nurse's location was at [place] office.   Review of Systems:  No ROS.  Medicare Wellness Visit. Additional risk factors are reflected in the social history.  Cardiac Risk Factors include: advanced age (>19men, >70 women);male gender;hypertension Sleep patterns: no sleep issues, feels rested on waking, gets up 1-2 times nightly to void and sleeps 7-8 hours nightly.   Home Safety/Smoke Alarms: Feels safe in home. Smoke alarms in place.  Living environment; residence and Firearm Safety: 2-story house. Lives with wife, no needs for DME, good support system Seat Belt Safety/Bike Helmet: Wears seat belt.      Objective:    Vitals: There were no vitals taken for this visit.  There is no height or weight on file to calculate BMI.  Advanced Directives 09/13/2018 08/03/2017 07/25/2016 07/10/2015 05/03/2012  Does Patient Have a Medical Advance Directive? Yes Yes Yes Yes Patient does not have advance directive  Type of Advance Directive [place];Living will [place];Living will [place];Living will [place];Living will -  Does patient want to make changes to medical advance directive? - - - No - Patient declined -  Copy of [place] in Chart? No - copy requested No - copy requested Yes Yes -  Pre-existing out of facility DNR order (yellow form or pink MOST form) - - - - No    Tobacco Social History   Tobacco Use  Smoking Status Never Smoker  Smokeless Tobacco Never Used     Counseling  given: Not Answered  Past Medical History:  Diagnosis Date   Anxiety    CAD (coronary artery disease)    a. s/p stent to LAD 2008;  b. abnormal ETT 11/13 with VTach => LHC pLAD 30% ISR, mCFX 30%, dRCA 60-75% which had progressed from previous study in 2010 => FFR of RCA 0.92 => med Rx    Carotid stenosis    dopplers 1/14:  0-39% bilateral ICA stenosis   Colon polyps    Colon polyps    adenomatous   Diverticular disease of colon    Hemorrhoids    external and internal   History of BPH    HTN (hypertension)    Hx of echocardiogram    a. Echo 9/11:  EF 55-60%, normal motion, grade 2 diastolic dysfunction, mild MR, mild LAE, mild RAE, PASP 33.   Hyperlipidemia    IBS (irritable bowel syndrome)    Rhinitis    Stroke, lacunar ([place]) 08/24/2012   Old, 2011 head mri   Past Surgical History:  Procedure Laterality Date   APPENDECTOMY  1956   CORONARY STENT PLACEMENT  2008    Successful PCI of the lesion in the proximal LAD using a Promus    HERNIA REPAIR Right 2005   NASAL SINUS SURGERY  1975   PERCUTANEOUS CORONARY STENT INTERVENTION (PCI-S) N/A 05/03/2012   Procedure: PERCUTANEOUS CORONARY STENT INTERVENTION (PCI-S);  Surgeon: Sherren Mocha, MD;  Location: [facility] CATH LAB;  Service: Cardiovascular;  Laterality: N/A;   Promus DES ok for 3T MRI  2008   Family History  Problem Relation Age of Onset   Coronary artery disease Father    Heart attack Father    Rheum arthritis Mother    Cancer Brother        ? 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[employer] resource strain: Not hard at all   Food insecurity:     Worry: Never true    Inability: Never true   Transportation needs:    Medical: No    Non-medical: No  Tobacco Use   Smoking status: Never Smoker   Smokeless tobacco: Never Used  Substance and Sexual Activity   Alcohol use: Not Currently    Comment: 4-6 drinks/week   Drug use: No   Sexual activity: Yes    Partners: Female  Lifestyle   Physical activity:    Days per week: 6 days    Minutes per session: 40 min   Stress: Not at all  Relationships   Social connections:    Talks on phone: More than three times a week    Gets together: More than three times a week    Attends religious service: More than 4 times per year    Active member of club or organization: Yes    Attends meetings of clubs or organizations: More than 4 times per year    Relationship status: Married  Other Topics Concern   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 He has two daughters, 1 in [place] 1 in [place] (Dec '12) and a son who works with him. Several grandchildren.Reports that he spends a good deal of time at his home in [facility] which he greatly enjoys and his home in the [place]. Enjoys driving his BMW convertible when in the mountains.      ACP - Yes CPR, yes for short-term mechanical ventilation for reversible disease. Recommended [email or web address] for consideration.    Outpatient Encounter Medications as of 09/13/2018  Medication Sig   diazepam (VALIUM) 5 MG tablet Take 1 tablet (5 mg total) by mouth every 12 (twelve) hours as needed for anxiety.   dutasteride (AVODART) 0.5 MG capsule Take 0.5 mg by mouth daily.    fluticasone (FLONASE) 50 MCG/ACT nasal spray USE 2 SPRAYS IN EACH NOSTRIL DAILY   gabapentin (NEURONTIN) 100 MG capsule Take 2 capsules (200 mg total) by mouth at bedtime.   ipratropium (ATROVENT) 0.03 % nasal spray USE 2  SPRAYS INTO BOTH NOSTRILS EVERY 12 HOURS   lisinopril (PRINIVIL,ZESTRIL) 10 MG tablet Take 1 tablet (10 mg total) by mouth daily.   metoprolol succinate (TOPROL XL) 25 MG 24 hr tablet Take 1 tablet (25 mg total) by mouth daily.   nitroGLYCERIN (NITROSTAT) 0.4 MG SL tablet Place 0.4 mg under the tongue every 5 (five) minutes as needed for chest pain (up to 3 doses only).   Pancrelipase, Lip-Prot-Amyl, (ZENPEP) 40000-126000 units CPEP Take 1 capsule by mouth 4 (four) times daily -  before meals and  at bedtime.   pantoprazole (PROTONIX) 40 MG tablet Take 1 tablet (40 mg total) by mouth daily.   Plecanatide (TRULANCE) 3 MG TABS Take 1 tablet by mouth daily as needed.   Vitamin D, Ergocalciferol, (DRISDOL) 1.25 MG (50000 UT) CAPS capsule TAKE 1 CAPSULE EVERY 7 DAYS   zolpidem (AMBIEN) 10 MG tablet TAKE 1 TABLET BY MOUTH AT BEDTIME AS NEEDED FOR SLEEP   rosuvastatin (CRESTOR) 40 MG tablet Take 1 tablet (40 mg total) by mouth daily.   No facility-administered encounter medications on file as of 09/13/2018.     Activities of Daily Living In your present state of health, do you have any difficulty performing the following activities: 09/13/2018  Hearing? N  Vision? N  Difficulty concentrating or making decisions? N  Walking or climbing stairs? N  Dressing or bathing? N  Doing errands, shopping? N  Preparing Food and eating ? N  Using the Toilet? N  In the past six months, have you accidently leaked urine? N  Do you have problems with loss of bowel control? N  Managing your Medications? N  Managing your Finances? N  Housekeeping or managing your Housekeeping? N  Some recent data might be hidden    Patient Care Team: [name], MD as PCP - General (Internal Medicine) Josue Hector, MD as PCP - Cardiology (Cardiology) Bjorn Loser, MD as Consulting Physician (Urology)   Assessment:   This is a routine wellness examination for [facility] Physical assessment deferred to  PCP.  Exercise Activities and Dietary recommendations Current Exercise Habits: Home exercise routine, Type of exercise: walking, Time (Minutes): 40, Frequency (Times/Week): 6, Weekly Exercise (Minutes/Week): 240, Intensity: Mild, Exercise limited by: None identified  Diet (meal preparation, eat out, water intake, caffeinated beverages, dairy products, fruits and vegetables): in general, a "healthy" diet  , well balanced.  eats a variety of fruits and vegetables daily, limits salt, fat/cholesterol, sugar,carbohydrates,caffeine, drinks 6-8 glasses of water daily.      Goals   None     Fall Risk Fall Risk  09/13/2018 08/05/2017 08/03/2017 07/25/2016 07/10/2015  Falls in the past year? 1 No No No No  Number falls in past yr: 0 - - - -  Injury with Fall? 0 - - - -  Follow up Falls prevention discussed - - - -   Depression Screen PHQ 2/9 Scores 09/13/2018 08/05/2017 08/03/2017 07/25/2016  PHQ - 2 Score 0 0 0 0  PHQ- 9 Score - - 0 -    Cognitive Function MMSE - Mini Mental State Exam 08/03/2017  Orientation to time 5  Orientation to Place 5  Registration 3  Attention/ Calculation 5  Recall 3  Language- name 2 objects 2  Language- repeat 1  Language- follow 3 step command 3  Language- read & follow direction 1  Write a sentence 1  Copy design 1  Total score 30       Ad8 score reviewed for issues:  Issues making decisions: no  Less interest in hobbies / activities: no  Repeats questions, stories (family complaining): no  Trouble using ordinary gadgets (microwave, computer, phone):no  Forgets the month or year: no  Mismanaging finances: no  Remembering appts: no  Daily problems with thinking and/or memory: no Ad8 score is= 0  Immunization History  Administered Date(s) Administered   H1N1 04/24/2008   Influenza Whole 02/27/2008, 04/28/2009, 03/01/2011   Influenza, High Dose Seasonal PF 02/06/2013, 02/21/2014, 02/17/2015, 02/10/2017, 03/09/2018   Influenza,inj,Quad  PF,6+ Mos 02/16/2016  Influenza-Unspecified 01/16/2012   Pneumococcal Conjugate-13 06/26/2013, 02/21/2014   Pneumococcal Polysaccharide-23 02/27/2008   Pneumococcal-Unspecified 02/06/2013   Tdap 12/15/2011   Zoster 04/28/2009, 05/10/2011, 02/17/2015   Zoster Recombinat (Shingrix) 01/24/2018   Screening Tests Health Maintenance  Topic Date Due   INFLUENZA VACCINE  12/16/2018   TETANUS/TDAP  12/14/2021   PNA vac Low Risk Adult  Completed      Plan:     Reviewed health maintenance screenings with patient today and relevant education, vaccines, and/or referrals were provided.   Continue doing brain stimulating activities (puzzles, reading, adult coloring books, staying active) to keep memory sharp.   Continue to eat heart healthy diet (full of fruits, vegetables, whole grains, lean protein, water--limit salt, fat, and sugar intake) and increase physical activity as tolerated.  I have personally reviewed and noted the following in the patients chart:    Medical and social history  Use of alcohol, tobacco or illicit drugs   Current medications and supplements  Functional ability and status  Nutritional status  Physical activity  Advanced directives  List of other physicians  Vitals  Screenings to include cognitive, depression, and falls  Referrals and appointments  In addition, I have reviewed and discussed with patient certain preventive protocols, quality metrics, and best practice recommendations. A written personalized care plan for preventive services as well as general preventive health recommendations were provided to patient.     Medical screening examination/treatment/procedure(s) were performed by non-physician practitioner and as supervising physician I was immediately available for consultation/collaboration. I agree with above. Scarlette Calico, MD    Michiel Cowboy, RN  09/13/2018

## 2018-09-13 ENCOUNTER — Ambulatory Visit (INDEPENDENT_AMBULATORY_CARE_PROVIDER_SITE_OTHER): Payer: Medicare Other | Admitting: *Deleted

## 2018-09-13 DIAGNOSIS — Z0000 Encounter for general adult medical examination without abnormal findings: Secondary | ICD-10-CM

## 2018-09-14 ENCOUNTER — Ambulatory Visit: Payer: Medicare Other | Admitting: Internal Medicine

## 2018-09-14 ENCOUNTER — Ambulatory Visit: Payer: Medicare Other

## 2018-09-20 DIAGNOSIS — L570 Actinic keratosis: Secondary | ICD-10-CM | POA: Diagnosis not present

## 2018-09-20 DIAGNOSIS — Z85820 Personal history of malignant melanoma of skin: Secondary | ICD-10-CM | POA: Diagnosis not present

## 2018-09-20 DIAGNOSIS — D1801 Hemangioma of skin and subcutaneous tissue: Secondary | ICD-10-CM | POA: Diagnosis not present

## 2018-09-20 DIAGNOSIS — C44629 Squamous cell carcinoma of skin of left upper limb, including shoulder: Secondary | ICD-10-CM | POA: Diagnosis not present

## 2018-09-20 DIAGNOSIS — D485 Neoplasm of uncertain behavior of skin: Secondary | ICD-10-CM | POA: Diagnosis not present

## 2018-09-20 DIAGNOSIS — L814 Other melanin hyperpigmentation: Secondary | ICD-10-CM | POA: Diagnosis not present

## 2018-09-20 DIAGNOSIS — Z85828 Personal history of other malignant neoplasm of skin: Secondary | ICD-10-CM | POA: Diagnosis not present

## 2018-09-20 DIAGNOSIS — L821 Other seborrheic keratosis: Secondary | ICD-10-CM | POA: Diagnosis not present

## 2018-09-28 ENCOUNTER — Other Ambulatory Visit: Payer: Self-pay | Admitting: Internal Medicine

## 2018-09-28 DIAGNOSIS — F40243 Fear of flying: Secondary | ICD-10-CM

## 2018-09-29 NOTE — Telephone Encounter (Signed)
Check [place] registry last filled 05/04/2018. Next OV 11/28/18. MD is out of the office will hold until he return for approval../lmb

## 2018-10-11 ENCOUNTER — Ambulatory Visit: Payer: Self-pay | Admitting: *Deleted

## 2018-10-11 ENCOUNTER — Other Ambulatory Visit: Payer: Self-pay

## 2018-10-11 ENCOUNTER — Ambulatory Visit (INDEPENDENT_AMBULATORY_CARE_PROVIDER_SITE_OTHER): Payer: Medicare Other | Admitting: Family Medicine

## 2018-10-11 ENCOUNTER — Ambulatory Visit: Payer: Self-pay

## 2018-10-11 ENCOUNTER — Encounter: Payer: Self-pay | Admitting: Family Medicine

## 2018-10-11 VITALS — BP 146/80 | HR 66 | Ht 71.0 in | Wt 150.0 lb

## 2018-10-11 DIAGNOSIS — M7062 Trochanteric bursitis, left hip: Secondary | ICD-10-CM

## 2018-10-11 DIAGNOSIS — I2510 Atherosclerotic heart disease of native coronary artery without angina pectoris: Secondary | ICD-10-CM

## 2018-10-11 DIAGNOSIS — M25551 Pain in right hip: Secondary | ICD-10-CM

## 2018-10-11 DIAGNOSIS — M7061 Trochanteric bursitis, right hip: Secondary | ICD-10-CM

## 2018-10-11 DIAGNOSIS — M25552 Pain in left hip: Secondary | ICD-10-CM | POA: Diagnosis not present

## 2018-10-11 NOTE — Patient Instructions (Signed)
Great to see you  Injected the side of hips again  Keep being active but maybe break until Monday  As long as you do well see me when you need me  Be safe!

## 2018-10-11 NOTE — Assessment & Plan Note (Signed)
Bilateral injections given today.  Discussed icing regimen and home exercise.  Discussed which activities of doing which wants to avoid.  Discussed posture and ergonomics.  Patient will increase activity slowly.  Follow-up again 4 to 8 weeks if pain is not resolved otherwise follow-up as needed

## 2018-10-11 NOTE — Progress Notes (Signed)
[name] [address], [place] [address] Phone: [phone] Subjective:    I'm seeing this patient by the request  of:    CC: Bilateral hip pain  [id number]  [name] [name] is a 85 y.o. male coming in with complaint of hip pain. Bilateral lateral pain. Would like an injection.  Patient was last seen nearly 10 months ago for a injection of the greater trochanteric bilaterally.  Patient had significant amount of improvement with those injections.  And only started having pain again in the last couple months.  Now waking up at night.  Feels that a new mattress has done well for him.    Past Medical History:  Diagnosis Date  . Anxiety   . CAD (coronary artery disease)    a. s/p stent to LAD 2008;  b. abnormal ETT 11/13 with VTach => LHC pLAD 30% ISR, mCFX 30%, dRCA 60-75% which had progressed from previous study in 2010 => FFR of RCA 0.92 => med Rx   . Carotid stenosis    dopplers 1/14:  0-39% bilateral ICA stenosis  . Colon polyps   . Colon polyps    adenomatous  . Diverticular disease of colon   . Hemorrhoids    external and internal  . History of BPH   . HTN (hypertension)   . Hx of echocardiogram    a. Echo 9/11:  EF 55-60%, normal motion, grade 2 diastolic dysfunction, mild MR, mild LAE, mild RAE, PASP 33.  Marland Kitchen Hyperlipidemia   . IBS (irritable bowel syndrome)   . Rhinitis   . Stroke, lacunar ([place]) 08/24/2012   Old, 2011 head mri   Past Surgical History:  Procedure Laterality Date  . APPENDECTOMY  1956  . CORONARY STENT PLACEMENT  2008    Successful PCI of the lesion in the proximal LAD using a Promus   . HERNIA REPAIR Right 2005  . NASAL SINUS SURGERY  1975  . PERCUTANEOUS CORONARY STENT INTERVENTION (PCI-S) N/A 05/03/2012   Procedure: PERCUTANEOUS CORONARY STENT INTERVENTION (PCI-S);  Surgeon: Sherren Mocha, MD;  Location: [facility] CATH LAB;  Service: Cardiovascular;  Laterality: N/A;  . Promus DES ok for 3T MRI  2008   Social  History   Socioeconomic History  . Marital status: Married    Spouse name: Not on file  . Number of children: 3  . Years of education: 74  . Highest education level: Not on file  Occupational History  . Occupation: businessman, 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RETIRED  Social Needs  . Financial resource strain: Not hard at all  . Food insecurity:    Worry: Never true    Inability: Never true  . Transportation needs:    Medical: No    Non-medical: No  Tobacco Use  . Smoking status: Never Smoker  . Smokeless tobacco: Never Used  Substance and Sexual Activity  . Alcohol use: Not Currently    Comment: 4-6 drinks/week  . Drug use: No  . Sexual activity: Yes    Partners: Female  Lifestyle  . Physical activity:    Days per week: 6 days    Minutes per session: 40 min  . Stress: Not at all  Relationships  . Social connections:    Talks on phone: More than three times a week    Gets together: More than three times a week    Attends religious service: More than 4 times per year    Active member  of club or organization: Yes    Attends meetings of clubs or organizations: More than 4 times per year    Relationship status: Married  Other Topics Concern  . 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 He has two daughters, 1 in [place] 1 in [place] (Dec '12) and a son who works with him. Several grandchildren.Reports that he spends a good deal of time at his home in [facility] which he greatly enjoys and his home in the [place]. Enjoys driving his BMW convertible when in the mountains.      ACP - Yes CPR, yes for short-term mechanical ventilation for reversible disease. Recommended [email or web address] for consideration.   No Known Allergies Family History  Problem Relation Age of Onset  . Coronary artery disease Father   . Heart attack  Father   . Rheum arthritis Mother   . Cancer Brother        ? TYPE  . Colon cancer Neg Hx   . Stomach cancer Neg Hx   . Pancreatic cancer Neg Hx      Current Outpatient Medications (Cardiovascular):  .  lisinopril (PRINIVIL,ZESTRIL) 10 MG tablet, Take 1 tablet (10 mg total) by mouth daily. .  metoprolol succinate (TOPROL XL) 25 MG 24 hr tablet, Take 1 tablet (25 mg total) by mouth daily. .  nitroGLYCERIN (NITROSTAT) 0.4 MG SL tablet, Place 0.4 mg under the tongue every 5 (five) minutes as needed for chest pain (up to 3 doses only). .  rosuvastatin (CRESTOR) 40 MG tablet, Take 1 tablet (40 mg total) by mouth daily.  Current Outpatient Medications (Respiratory):  .  fluticasone (FLONASE) 50 MCG/ACT nasal spray, USE 2 SPRAYS IN EACH NOSTRIL DAILY .  ipratropium (ATROVENT) 0.03 % nasal spray, USE 2 SPRAYS INTO BOTH NOSTRILS EVERY 12 HOURS    Current Outpatient Medications (Other):  .  diazepam (VALIUM) 5 MG tablet, TAKE 1 TABLET(5 MG) BY MOUTH EVERY 12 HOURS AS NEEDED FOR ANXIETY .  dutasteride (AVODART) 0.5 MG capsule, Take 0.5 mg by mouth daily.  Marland Kitchen  gabapentin (NEURONTIN) 100 MG capsule, Take 2 capsules (200 mg total) by mouth at bedtime. .  Pancrelipase, Lip-Prot-Amyl, (ZENPEP) 40000-126000 units CPEP, Take 1 capsule by mouth 4 (four) times daily -  before meals and at bedtime. .  pantoprazole (PROTONIX) 40 MG tablet, Take 1 tablet (40 mg total) by mouth daily. Marland Kitchen  Plecanatide (TRULANCE) 3 MG TABS, Take 1 tablet by mouth daily as needed. .  Vitamin D, Ergocalciferol, (DRISDOL) 1.25 MG (50000 UT) CAPS capsule, TAKE 1 CAPSULE EVERY 7 DAYS .  zolpidem (AMBIEN) 10 MG tablet, TAKE 1 TABLET BY MOUTH AT BEDTIME AS NEEDED FOR SLEEP    Past medical history, social, surgical and family history all reviewed in electronic medical record.  No pertanent information unless stated regarding to the chief complaint.   Review of Systems:  No headache, visual changes, nausea, vomiting, diarrhea,  constipation, dizziness, abdominal pain, skin rash, fevers, chills, night sweats, weight loss, swollen lymph nodes, body aches, joint swelling,  chest pain, shortness of breath, mood changes.  Mild positive muscle aches  Objective  Blood pressure (!) 146/80, pulse 66, height 5\' 11"  (1.803 m), weight 150 lb (68 kg), SpO2 96 %.    General: No apparent distress alert and oriented x3 mood and affect normal, dressed appropriately.  HEENT: Pupils equal, extraocular movements  intact  Respiratory: Patient's speak in full sentences and does not appear short of breath  Cardiovascular: No lower extremity edema, non tender, no erythema  Skin: Warm dry intact with no signs of infection or rash on extremities or on axial skeleton.  Abdomen: Soft nontender  Neuro: Cranial nerves II through XII are intact, neurovascularly intact in all extremities with 2+ DTRs and 2+ pulses.  Lymph: No lymphadenopathy of posterior or anterior cervical chain or axillae bilaterally.  Gait normal with good balance and coordination.  MSK:  Non tender with full range of motion and good stability and symmetric strength and tone of shoulders, elbows, wrist, , knee and ankles bilaterally.  Bilateral hip exam shows that patient does have some atrophy of the hip abductors.  Severely tender to palpation over the greater trochanteric area bilaterally right greater than left.  Positive Faber bilaterally.  Negative straight leg test.  Patient does have some loss of lordosis of the lumbar spine with some mild degenerative scoliosis   Procedure: Real-time Ultrasound Guided Injection of right greater trochanteric bursitis secondary to patient's body habitus Device: GE Logiq Q7 Ultrasound guided injection is preferred based studies that show increased duration, increased effect, greater accuracy, decreased procedural pain, increased response rate, and decreased cost with ultrasound guided versus blind injection.  Verbal informed consent  obtained.  Time-out conducted.  Noted no overlying erythema, induration, or other signs of local infection.  Skin prepped in a sterile fashion.  Local anesthesia: Topical Ethyl chloride.  With sterile technique and under real time ultrasound guidance:  Greater trochanteric area was visualized and patient's bursa was noted. A 22-gauge 3 inch needle was inserted and 4 cc of 0.5% Marcaine and 1 cc of Kenalog 40 mg/dL was injected. Pictures taken Completed without difficulty  Pain immediately resolved suggesting accurate placement of the medication.  Advised to call if fevers/chills, erythema, induration, drainage, or persistent bleeding.  Images permanently stored and available for review in the ultrasound unit.  Impression: Technically successful ultrasound guided injection.   Procedure: Real-time Ultrasound Guided Injection of left  greater trochanteric bursitis secondary to patient's body habitus Device: GE Logiq Q7  Ultrasound guided injection is preferred based studies that show increased duration, increased effect, greater accuracy, decreased procedural pain, increased response rate, and decreased cost with ultrasound guided versus blind injection.  Verbal informed consent obtained.  Time-out conducted.  Noted no overlying erythema, induration, or other signs of local infection.  Skin prepped in a sterile fashion.  Local anesthesia: Topical Ethyl chloride.  With sterile technique and under real time ultrasound guidance:  Greater trochanteric area was visualized and patient's bursa was noted. A 22-gauge 3 inch needle was inserted and 4 cc of 0.5% Marcaine and 1 cc of Kenalog 40 mg/dL was injected. Pictures taken Completed without difficulty  Pain immediately resolved suggesting accurate placement of the medication.  Advised to call if fevers/chills, erythema, induration, drainage, or persistent bleeding.  Images permanently stored and available for review in the ultrasound unit.   Impression: Technically successful ultrasound guided injection.   Impression and Recommendations:     This case required medical decision making of moderate complexity. The above documentation has been reviewed and is accurate and complete Lyndal Pulley, DO       Note: This dictation was prepared with Dragon dictation along with smaller phrase technology. Any transcriptional errors that result from this process are unintentional.

## 2018-10-11 NOTE — Telephone Encounter (Signed)
Requesting a visit with Dr. Tamala Julian for injection for hip pain, left and right. No triage performed. No travels and no known positive contact. Information called to [facility] will contact patient for appointment.  Reason for Disposition . [1] MODERATE pain (e.g., interferes with normal activities, limping) AND [2] present > 3 days  Answer Assessment - Initial Assessment Questions 1. LOCATION and RADIATION: "Where is the pain located?"     Left and right hip pain 2. QUALITY: "What does the pain feel like?"  (e.g., sharp, dull, aching, burning)    aching 3. SEVERITY: "How bad is the pain?" "What does it keep you from doing?"   (Scale 1-10; or mild, moderate, severe)   -  MILD (1-3): doesn't interfere with normal activities    -  MODERATE (4-7): interferes with normal activities (e.g., work or school) or awakens from sleep, limping    -  SEVERE (8-10): excruciating pain, unable to do any normal activities, unable to walk     mild 4. ONSET: "When did the pain start?" "Does it come and go, or is it there all the time?"    One-two months 5. WORK OR EXERCISE: "Has there been any recent work or exercise that involved this part of the body?"     none 6. CAUSE: "What do you think is causing the hip pain?"      bursitis 7. AGGRAVATING FACTORS: "What makes the hip pain worse?" (e.g., walking, climbing stairs, running)     Sleeping on it 8. OTHER SYMPTOMS: "Do you have any other symptoms?" (e.g., back pain, pain shooting down leg,  fever, rash)    None.  Protocols used: HIP PAIN-A-AH

## 2018-10-25 ENCOUNTER — Other Ambulatory Visit: Payer: Self-pay | Admitting: Cardiovascular Disease

## 2018-11-07 ENCOUNTER — Telehealth: Payer: Self-pay

## 2018-11-07 NOTE — Telephone Encounter (Signed)
Key: [id number]  PA approved. Left detailed message for patient informing of same.

## 2018-11-08 ENCOUNTER — Other Ambulatory Visit: Payer: Self-pay | Admitting: Cardiovascular Disease

## 2018-11-13 ENCOUNTER — Other Ambulatory Visit: Payer: Self-pay | Admitting: Internal Medicine

## 2018-11-13 DIAGNOSIS — E559 Vitamin D deficiency, unspecified: Secondary | ICD-10-CM

## 2018-11-28 ENCOUNTER — Encounter: Payer: Self-pay | Admitting: Internal Medicine

## 2018-11-28 ENCOUNTER — Other Ambulatory Visit: Payer: Self-pay

## 2018-11-28 ENCOUNTER — Ambulatory Visit (INDEPENDENT_AMBULATORY_CARE_PROVIDER_SITE_OTHER): Payer: Medicare Other | Admitting: Internal Medicine

## 2018-11-28 ENCOUNTER — Other Ambulatory Visit (INDEPENDENT_AMBULATORY_CARE_PROVIDER_SITE_OTHER): Payer: Medicare Other

## 2018-11-28 VITALS — BP 120/58 | HR 69 | Temp 97.6°F | Ht 71.0 in | Wt 140.0 lb

## 2018-11-28 DIAGNOSIS — I2510 Atherosclerotic heart disease of native coronary artery without angina pectoris: Secondary | ICD-10-CM | POA: Diagnosis not present

## 2018-11-28 DIAGNOSIS — E559 Vitamin D deficiency, unspecified: Secondary | ICD-10-CM | POA: Diagnosis not present

## 2018-11-28 DIAGNOSIS — E785 Hyperlipidemia, unspecified: Secondary | ICD-10-CM

## 2018-11-28 DIAGNOSIS — I10 Essential (primary) hypertension: Secondary | ICD-10-CM | POA: Diagnosis not present

## 2018-11-28 DIAGNOSIS — K5904 Chronic idiopathic constipation: Secondary | ICD-10-CM | POA: Diagnosis not present

## 2018-11-28 LAB — URINALYSIS, ROUTINE W REFLEX MICROSCOPIC
Bilirubin Urine: NEGATIVE
Hgb urine dipstick: NEGATIVE
Ketones, ur: NEGATIVE
Leukocytes,Ua: NEGATIVE
Nitrite: NEGATIVE
RBC / HPF: NONE SEEN (ref 0–?)
Specific Gravity, Urine: 1.025 (ref 1.000–1.030)
Total Protein, Urine: NEGATIVE
Urine Glucose: NEGATIVE
Urobilinogen, UA: 0.2 (ref 0.0–1.0)
pH: 5.5 (ref 5.0–8.0)

## 2018-11-28 LAB — LIPID PANEL
Cholesterol: 122 mg/dL (ref 0–200)
HDL: 63.4 mg/dL (ref >39.00)
LDL Cholesterol: 40 mg/dL (ref 0–99)
NonHDL: 58.12
Total CHOL/HDL Ratio: 2
Triglycerides: 89 mg/dL (ref 0.0–149.0)
VLDL: 17.8 mg/dL (ref 0.0–40.0)

## 2018-11-28 LAB — BASIC METABOLIC PANEL
BUN: 19 mg/dL (ref 6–23)
CO2: 25 mEq/L (ref 19–32)
Calcium: 9.7 mg/dL (ref 8.4–10.5)
Chloride: 107 mEq/L (ref 96–112)
Creatinine, Ser: 1.04 mg/dL (ref 0.40–1.50)
GFR: 68.11 mL/min (ref >60.00)
Glucose, Bld: 87 mg/dL (ref 70–99)
Potassium: 4.1 mEq/L (ref 3.5–5.1)
Sodium: 139 mEq/L (ref 135–145)

## 2018-11-28 LAB — CBC WITH DIFFERENTIAL/PLATELET
Basophils Absolute: 0 10*3/uL (ref 0.0–0.1)
Basophils Relative: 0.8 % (ref 0.0–3.0)
Eosinophils Absolute: 0.1 10*3/uL (ref 0.0–0.7)
Eosinophils Relative: 2.9 % (ref 0.0–5.0)
HCT: 41.2 % (ref 39.0–52.0)
Hemoglobin: 14 g/dL (ref 13.0–17.0)
Lymphocytes Relative: 28.8 % (ref 12.0–46.0)
Lymphs Abs: 1.5 10*3/uL (ref 0.7–4.0)
MCHC: 33.9 g/dL (ref 30.0–36.0)
MCV: 91.5 fl (ref 78.0–100.0)
Monocytes Absolute: 0.6 10*3/uL (ref 0.1–1.0)
Monocytes Relative: 10.9 % (ref 3.0–12.0)
Neutro Abs: 2.9 10*3/uL (ref 1.4–7.7)
Neutrophils Relative %: 56.6 % (ref 43.0–77.0)
Platelets: 199 10*3/uL (ref 150.0–400.0)
RBC: 4.5 Mil/uL (ref 4.22–5.81)
RDW: 14.1 % (ref 11.5–15.5)
WBC: 5.1 10*3/uL (ref 4.0–10.5)

## 2018-11-28 LAB — HEPATIC FUNCTION PANEL
ALT: 22 U/L (ref 0–53)
AST: 21 U/L (ref 0–37)
Albumin: 4.4 g/dL (ref 3.5–5.2)
Alkaline Phosphatase: 48 U/L (ref 39–117)
Bilirubin, Direct: 0.1 mg/dL (ref 0.0–0.3)
Total Bilirubin: 0.5 mg/dL (ref 0.2–1.2)
Total Protein: 6.8 g/dL (ref 6.0–8.3)

## 2018-11-28 LAB — TSH: TSH: 2.35 u[IU]/mL (ref 0.35–4.50)

## 2018-11-28 LAB — VITAMIN D 25 HYDROXY (VIT D DEFICIENCY, FRACTURES): VITD: 72.35 ng/mL (ref 30.00–100.00)

## 2018-11-28 NOTE — Patient Instructions (Signed)
Coronary Artery Disease, Male °Coronary artery disease (CAD) is a condition in which the arteries that lead to the heart (coronary arteries) become narrow or blocked. The narrowing or blockage can lead to decreased blood flow to the heart. Prolonged reduced blood flow can cause a heart attack (myocardial infarction or MI). This condition may also be called coronary heart disease. °Because CAD is the leading cause of death in men, it is important to understand what causes this condition and how it is treated. °What are the causes? °CAD is most often caused by atherosclerosis. This is the buildup of fat and cholesterol (plaque) on the inside of the arteries. Over time, the plaque may narrow or block the artery, reducing blood flow to the heart. Plaque can also become weak and break off within a coronary artery and cause a sudden blockage. Other less common causes of CAD include: °· A blood clot or a piece of a blood clot or other substance that blocks the flow of blood in a coronary artery (embolism). °· A tearing of the artery (spontaneous coronary artery dissection). °· An enlargement of an artery (aneurysm). °· Inflammation (vasculitis) in the artery wall. °What increases the risk? °The following factors may make you more likely to develop this condition: °· Age. Men over age 45 are at a greater risk of CAD. °· Family history of CAD. °· Gender. Men often develop CAD earlier in life than women. °· High blood pressure (hypertension). °· Diabetes. °· High cholesterol levels. °· Tobacco use. °· Excessive alcohol use. °· Lack of exercise. °· A diet high in saturated and trans fats, such as fried food and processed meat. °Other possible risk factors include: °· High stress levels. °· Depression. °· Obesity. °· Sleep apnea. °What are the signs or symptoms? °Many people do not have any symptoms during the early stages of CAD. As the condition progresses, symptoms may include: °· Chest pain (angina). The pain can: °? Feel  like crushing or squeezing, or like a tightness, pressure, fullness, or heaviness in the chest. °? Last more than a few minutes or can stop and recur. The pain tends to get worse with exercise or stress and to fade with rest. °· Pain in the arms, neck, jaw, ear, or back. °· Unexplained heartburn or indigestion. °· Shortness of breath. °· Nausea or vomiting. °· Sudden light-headedness. °· Sudden cold sweats. °· Fluttering or fast heartbeat (palpitations). °How is this diagnosed? °This condition is diagnosed based on: °· Your family and medical history. °· A physical exam. °· Tests, including: °? A test to check the electrical signals in your heart (electrocardiogram). °? Exercise stress test. This looks for signs of blockage when the heart is stressed with exercise, such as running on a treadmill. °? Pharmacologic stress test. This test looks for signs of blockage when the heart is being stressed with a medicine. °? Blood tests. °? Coronary angiogram. This is a procedure to look at the coronary arteries to see if there is any blockage. During this test, a dye is injected into your arteries so they appear on an X-ray. °? Coronary artery CT scan. This CT scan helps detect calcium deposits in your coronary arteries. Calcium deposits are an indicator of CAD. °? A test that uses sound waves to take a picture of your heart (echocardiogram). °? Chest X-ray. °How is this treated? °This condition may be treated by: °· Healthy lifestyle changes to reduce risk factors. °· Medicines such as: °? Antiplatelet medicines and blood-thinning medicines, such   as aspirin. These help to prevent blood clots. °? Nitroglycerin. °? Blood pressure medicines. °? Cholesterol-lowering medicine. °· Coronary angioplasty and stenting. During this procedure, a thin, flexible tube is inserted through a blood vessel and into a blocked artery. A balloon or similar device on the end of the tube is inflated to open up the artery. In some cases, a small,  mesh tube (stent) is inserted into the artery to keep it open. °· Coronary artery bypass surgery. During this surgery, veins or arteries from other parts of the body are used to create a bypass around the blockage and allow blood to reach your heart. °Follow these instructions at home: °Medicines °· Take over-the-counter and prescription medicines only as told by your health care provider. °· Do not take the following medicines unless your health care provider approves: °? NSAIDs, such as ibuprofen, naproxen, or celecoxib. °? Vitamin supplements that contain vitamin A, vitamin E, or both. °Lifestyle °· Follow an exercise program approved by your health care provider. Aim for 150 minutes of moderate exercise or 75 minutes of vigorous exercise each week. °· Maintain a healthy weight or lose weight as approved by your health care provider. °· Learn to manage stress or try to limit your stress. Ask your health care provider for suggestions if you need help. °· Get screened for depression and seek treatment, if needed. °· Do not use any products that contain nicotine or tobacco, such as cigarettes, e-cigarettes, and chewing tobacco. If you need help quitting, ask your health care provider. °· Do not use illegal drugs. °Eating and drinking ° °· Follow a heart-healthy diet. A dietitian can help educate you about healthy food options and changes. In general, eat plenty of fruits and vegetables, lean meats, and whole grains. °· Avoid foods high in: °? Sugar. °? Salt (sodium). °? Saturated fat, such as processed or fatty meat. °? Trans fat, such as fried foods. °· Use healthy cooking methods such as roasting, grilling, broiling, baking, poaching, steaming, or stir-frying. °· Do not drink alcohol if your health care provider tells you not to drink. °· If you drink alcohol: °? Limit how much you have to 0-2 drinks per day. °? Be aware of how much alcohol is in your drink. In the [place] one drink equals one 12 oz bottle of beer  (355 mL), one 5 oz glass of wine (148 mL), or one 1½ oz glass of hard liquor (44 mL). °General instructions °· Manage any other health conditions, such as hypertension and diabetes. These conditions affect your heart. °· Your health care provider may ask you to monitor your blood pressure. Ideally, your blood pressure should be below 130/80. °· Keep all follow-up visits as told by your health care provider. This is important. °Get help right away if: °· You have pain in your chest, neck, ear, arm, jaw, stomach, or back that: °? Lasts more than a few minutes. °? Is recurring. °? Is not relieved by taking medicine under your tongue (sublingual nitroglycerin). °· You have profuse sweating without cause. °· You have unexplained: °? Heartburn or indigestion. °? Shortness of breath or difficulty breathing. °? Fluttering or fast heartbeat (palpitations). °? Nausea or vomiting. °? Fatigue. °? Feelings of nervousness or anxiety. °? Weakness. °? Diarrhea. °· You have sudden light-headedness or dizziness. °· You faint. °· You feel like hurting yourself or think about taking your own life. °These symptoms may represent a serious problem that is an emergency. Do not wait to see if the   symptoms will go away. Get medical help right away. Call your local emergency services (911 in the [place] Do not drive yourself to the hospital. °Summary °· Coronary artery disease (CAD) is a condition in which the arteries that lead to the heart (coronary arteries) become narrow or blocked. The narrowing or blockage can lead to a heart attack. °· Many people do not have any symptoms during the early stages of CAD. °· CAD can be treated with lifestyle changes, medicines, surgery, or a combination of these treatments. °This information is not intended to replace advice given to you by your health care provider. Make sure you discuss any questions you have with your health care provider. °Document Released: 11/28/2013 Document Revised: 01/20/2018  Document Reviewed: 01/10/2018 °Elsevier Patient Education © 2020 [employer] ° °

## 2018-11-28 NOTE — Progress Notes (Signed)
Subjective:  Patient ID: [name] [name], male    DOB: [birth year]  Age: 85 y.o. MRN: [id number]  CC: Hypertension and Coronary Artery Disease   HPI KIAM BRANSFIELD presents for f/up - He walks several miles each day and does not experience fatigue, DOE, CP, palpitations, or edema.  Outpatient Medications Prior to Visit  Medication Sig Dispense Refill   diazepam (VALIUM) 5 MG tablet TAKE 1 TABLET(5 MG) BY MOUTH EVERY 12 HOURS AS NEEDED FOR ANXIETY 20 tablet 1   fluticasone (FLONASE) 50 MCG/ACT nasal spray USE 2 SPRAYS IN EACH NOSTRIL DAILY 32 g 11   gabapentin (NEURONTIN) 100 MG capsule Take 2 capsules (200 mg total) by mouth at bedtime. 60 capsule 3   ipratropium (ATROVENT) 0.03 % nasal spray USE 2 SPRAYS INTO BOTH NOSTRILS EVERY 12 HOURS 90 mL 2   lisinopril (PRINIVIL,ZESTRIL) 10 MG tablet Take 1 tablet (10 mg total) by mouth daily. 7 tablet 0   metoprolol succinate (TOPROL XL) 25 MG 24 hr tablet Take 1 tablet (25 mg total) by mouth daily. 90 tablet 1   rosuvastatin (CRESTOR) 40 MG tablet TAKE 1 TABLET DAILY 90 tablet 2   zolpidem (AMBIEN) 10 MG tablet TAKE 1 TABLET BY MOUTH AT BEDTIME AS NEEDED FOR SLEEP 30 tablet 3   dutasteride (AVODART) 0.5 MG capsule Take 0.5 mg by mouth daily.      nitroGLYCERIN (NITROSTAT) 0.4 MG SL tablet Place 0.4 mg under the tongue every 5 (five) minutes as needed for chest pain (up to 3 doses only).     Vitamin D, Ergocalciferol, (DRISDOL) 1.25 MG (50000 UT) CAPS capsule TAKE 1 CAPSULE EVERY 7 DAYS (Patient not taking: Reported on 11/28/2018) 4 capsule 0   Pancrelipase, Lip-Prot-Amyl, (ZENPEP) 40000-126000 units CPEP Take 1 capsule by mouth 4 (four) times daily -  before meals and at bedtime. (Patient not taking: Reported on 11/28/2018) 360 capsule 1   pantoprazole (PROTONIX) 40 MG tablet Take 1 tablet (40 mg total) by mouth daily. (Patient not taking: Reported on 11/28/2018) 90 tablet 3   Plecanatide (TRULANCE) 3 MG TABS Take 1 tablet by mouth daily  as needed. (Patient not taking: Reported on 11/28/2018) 30 tablet 0   No facility-administered medications prior to visit.     ROS Review of Systems  Constitutional: Negative for diaphoresis, fatigue and unexpected weight change.  HENT: Negative.   Eyes: Negative for visual disturbance.  Respiratory: Negative for cough, chest tightness, shortness of breath and wheezing.   Cardiovascular: Negative for chest pain, palpitations and leg swelling.  Gastrointestinal: Positive for constipation. Negative for abdominal pain, diarrhea, nausea and vomiting.       He has mild, intermittent constipation that is adequately well controlled with over-the-counter remedies.  Endocrine: Negative.   Genitourinary: Negative.  Negative for difficulty urinating and hematuria.  Musculoskeletal: Negative for arthralgias and myalgias.  Skin: Negative.  Negative for color change.  Neurological: Negative.  Negative for dizziness and light-headedness.  Hematological: Negative for adenopathy. Does not bruise/bleed easily.  Psychiatric/Behavioral: Negative.     Objective:  BP (!) 120/58 (BP Location: Left Arm, Patient Position: Sitting, Cuff Size: Normal)    Pulse 69    Temp 97.6 F (36.4 C) (Oral)    Ht 5\' 11"  (1.803 m)    Wt 140 lb (63.5 kg)    SpO2 96%    BMI 19.53 kg/m   BP Readings from Last 3 Encounters:  11/28/18 (!) 120/58  10/11/18 (!) 146/80  07/14/18 108/70  Wt Readings from Last 3 Encounters:  11/28/18 140 lb (63.5 kg)  10/11/18 150 lb (68 kg)  07/14/18 152 lb (68.9 kg)    Physical Exam Vitals signs reviewed.  Constitutional:      Appearance: He is not ill-appearing.  HENT:     Nose: Nose normal.     Mouth/Throat:     Mouth: Mucous membranes are moist.  Eyes:     General: No scleral icterus.    Conjunctiva/sclera: Conjunctivae normal.  Neck:     Musculoskeletal: Normal range of motion. No neck rigidity.  Cardiovascular:     Rate and Rhythm: Normal rate and regular rhythm.  Occasional extrasystoles are present.    Heart sounds: No murmur. No gallop.      Comments: EKG --  Sinus  Rhythm  - frequent PAC s  # PACs = 2. -Incomplete right bundle branch block.   ABNORMAL - no significant change Pulmonary:     Effort: Pulmonary effort is normal.     Breath sounds: Normal breath sounds. No wheezing, rhonchi or rales.  Abdominal:     General: Abdomen is flat. Bowel sounds are normal. There is no distension.     Palpations: There is no hepatomegaly or splenomegaly.     Tenderness: There is no abdominal tenderness.  Musculoskeletal: Normal range of motion.     Right lower leg: No edema.     Left lower leg: No edema.  Lymphadenopathy:     Cervical: No cervical adenopathy.  Skin:    General: Skin is warm and dry.     Coloration: Skin is not pale.  Neurological:     General: No focal deficit present.     Mental Status: He is alert.  Psychiatric:        Mood and Affect: Mood normal.        Behavior: Behavior normal.     Lab Results  Component Value Date   WBC 6.7 08/04/2017   HGB 14.5 08/04/2017   HCT 42.5 08/04/2017   PLT 196.0 08/04/2017   GLUCOSE 98 08/04/2017   CHOL 132 08/04/2017   TRIG 128.0 08/04/2017   HDL 62.00 08/04/2017   LDLDIRECT 132.2 03/10/2007   LDLCALC 44 08/04/2017   ALT 15 08/04/2017   AST 18 08/04/2017   NA 141 08/04/2017   K 4.1 08/04/2017   CL 107 08/04/2017   CREATININE 1.07 08/04/2017   BUN 14 08/04/2017   CO2 25 08/04/2017   TSH 3.61 08/04/2017   PSA 0.44 05/10/2011   INR 1.0 04/27/2012    Ct Abdomen Pelvis W Contrast  Result Date: 12/30/2016 CLINICAL DATA:  Epigastric tenderness, some rebound, abdominal bloating, lack of appetite EXAM: CT ABDOMEN AND PELVIS WITH CONTRAST TECHNIQUE: Multidetector CT imaging of the abdomen and pelvis was performed using the standard protocol following bolus administration of intravenous contrast. CONTRAST:  116mL ISOVUE-300 IOPAMIDOL (ISOVUE-300) INJECTION 61% COMPARISON:  None.  FINDINGS: Lower chest: The lung bases are clear. The heart is within normal limits in size for age. No pericardial effusion is seen. There may be a small hiatal hernia present. Hepatobiliary: The single well defined low-attenuation structure is noted in the left lobe of liver most likely benign. No other hepatic abnormality is seen. Pancreas: The pancreas is normal in size and the pancreatic duct is not dilated. Spleen: The spleen is unremarkable. Adrenals/Urinary Tract: The adrenal glands appear normal. The kidneys enhance with no evidence of mass or calculi. A cyst emanates from the upper pole of the right  kidney measuring 2.6 cm diameter. On delayed images the pelvocaliceal systems appear normal. Small low-attenuation renal structures are present bilaterally most consistent with small renal cysts. The ureters appear normal in caliber. The urinary bladder is slightly urine distended and there is indentation by and enlarged prostate present. A right Hutch diverticulum is present as well. The bladder wall is somewhat thick wall which may indicate a degree of bladder outlet obstruction. Stomach/Bowel: The stomach is decompressed. No small bowel distention is seen. There are rectosigmoid colon diverticula present. No diverticulitis is noted. Some contrast and feces is noted throughout the colon. No obvious colonic lesion is noted. The terminal ileum is unremarkable. No inflammatory process is seen within the right lower quadrant. Vascular/Lymphatic: The abdominal aorta is normal in caliber with mild abdominal aortic atherosclerosis noted. No adenopathy is seen. Reproductive: The prostate is enlarged measuring 4.1 x 4.6 cm per Other: None. Musculoskeletal: Degenerative disc disease is noted primarily at L5-S1. IMPRESSION: 1. No explanation for the patient's abdominal tenderness is seen. 2. Slightly thick-walled urinary bladder may be due to a degree of bladder outlet obstruction. Small right Hutch diverticulum. 3.  Rectosigmoid colon diverticula.  No evidence of diverticulitis. 4. Prominent prostate. Electronically Signed   By: Ivar Drape M.D.   On: 12/30/2016 11:37    Assessment & Plan:   Nimesh was seen today for hypertension and coronary artery disease.  Diagnoses and all orders for this visit:  Atherosclerosis of native coronary artery of native heart without angina pectoris- He has been free of angina and his EKG is negative for ischemia.  Will continue risk factor modifications. -     Lipid panel; Future -     EKG 12-Lead  Essential hypertension- His blood pressure is adequately well controlled.  Labs are negative for secondary causes or endorgan damage. -     CBC with Differential/Platelet; Future -     Basic metabolic panel; Future -     Urinalysis, Routine w reflex microscopic; Future  Chronic idiopathic constipation- His lab work is negative for any evidence of secondary causes.  He will continue over-the-counter remedies as needed. -     Basic metabolic panel; Future -     TSH; Future  Hyperlipidemia with target LDL less than 70- He has achieved his LDL goal and is doing well on the statin. -     Lipid panel; Future -     TSH; Future -     Hepatic function panel; Future  Vitamin D deficiency-his vitamin D level is normal now. -     VITAMIN D 25 Hydroxy (Vit-D Deficiency, Fractures); Future   I have discontinued Macky Lower "Cooper"'s pantoprazole, Pancrelipase (Lip-Prot-Amyl), and Plecanatide. I am also having him maintain his Avodart, nitroGLYCERIN, fluticasone, ipratropium, gabapentin, lisinopril, zolpidem, diazepam, rosuvastatin, metoprolol succinate, and Vitamin D (Ergocalciferol).  No orders of the defined types were placed in this encounter.    Follow-up: No follow-ups on file.  Scarlette Calico, MD

## 2018-12-06 DIAGNOSIS — Z85820 Personal history of malignant melanoma of skin: Secondary | ICD-10-CM | POA: Diagnosis not present

## 2018-12-06 DIAGNOSIS — D0461 Carcinoma in situ of skin of right upper limb, including shoulder: Secondary | ICD-10-CM | POA: Diagnosis not present

## 2018-12-06 DIAGNOSIS — Z85828 Personal history of other malignant neoplasm of skin: Secondary | ICD-10-CM | POA: Diagnosis not present

## 2018-12-06 DIAGNOSIS — D485 Neoplasm of uncertain behavior of skin: Secondary | ICD-10-CM | POA: Diagnosis not present

## 2018-12-06 DIAGNOSIS — C44622 Squamous cell carcinoma of skin of right upper limb, including shoulder: Secondary | ICD-10-CM | POA: Diagnosis not present

## 2018-12-20 ENCOUNTER — Telehealth: Payer: Self-pay | Admitting: Internal Medicine

## 2018-12-20 NOTE — Telephone Encounter (Signed)
Per PCP, pt is due for an appt.   Call to pt on mobile, tried to call but was not able to hear pt when he picked up.

## 2018-12-20 NOTE — Telephone Encounter (Signed)
Pt stated he just had his physical 3wks ago and don't know why he need to come in for an appt

## 2018-12-20 NOTE — Telephone Encounter (Signed)
Medication Refill - Medication: zolpidem (AMBIEN) 10 MG tablet   Preferred Pharmacy (with phone number or street name):  [facility] [place], [address] [place] [phone] (Phone) [phone] (Fax)

## 2018-12-21 ENCOUNTER — Other Ambulatory Visit: Payer: Self-pay | Admitting: Internal Medicine

## 2018-12-21 DIAGNOSIS — F5104 Psychophysiologic insomnia: Secondary | ICD-10-CM

## 2018-12-21 MED ORDER — ZOLPIDEM TARTRATE 10 MG PO TABS: 10.0000 mg | ORAL_TABLET | Freq: Every evening | ORAL | 1 refills | Status: DC | PRN

## 2018-12-21 NOTE — Telephone Encounter (Signed)
No, I just saw him.  Obviously I do not need to see him again.  Please reassure him that I did not say that I need to see him again to refill his Ambien.  Please find out who said that and have them correct this.  TJ

## 2018-12-21 NOTE — Telephone Encounter (Signed)
LOV with PCP was 11/28/2018.   Do you still want to see him again?

## 2018-12-21 NOTE — Telephone Encounter (Signed)
Called pt and apologized for my oversight and informed that rx has been sent in.

## 2018-12-26 ENCOUNTER — Encounter: Payer: Self-pay | Admitting: Internal Medicine

## 2018-12-26 ENCOUNTER — Other Ambulatory Visit: Payer: Self-pay | Admitting: Internal Medicine

## 2018-12-26 DIAGNOSIS — E559 Vitamin D deficiency, unspecified: Secondary | ICD-10-CM

## 2018-12-26 MED ORDER — VITAMIN D (ERGOCALCIFEROL) 1.25 MG (50000 UNIT) PO CAPS: ORAL_CAPSULE | ORAL | 0 refills | Status: DC

## 2019-01-04 ENCOUNTER — Ambulatory Visit (INDEPENDENT_AMBULATORY_CARE_PROVIDER_SITE_OTHER): Payer: Medicare Other | Admitting: Family Medicine

## 2019-01-04 ENCOUNTER — Other Ambulatory Visit: Payer: Self-pay

## 2019-01-04 ENCOUNTER — Encounter: Payer: Self-pay | Admitting: Family Medicine

## 2019-01-04 ENCOUNTER — Ambulatory Visit: Payer: Self-pay

## 2019-01-04 ENCOUNTER — Ambulatory Visit (INDEPENDENT_AMBULATORY_CARE_PROVIDER_SITE_OTHER)
Admission: RE | Admit: 2019-01-04 | Discharge: 2019-01-04 | Disposition: A | Discharge status: Home / Self Care | Payer: Medicare Other | Source: Ambulatory Visit | Attending: Family Medicine | Admitting: Family Medicine

## 2019-01-04 VITALS — BP 124/66 | HR 79 | Ht 71.0 in | Wt 145.0 lb

## 2019-01-04 DIAGNOSIS — M25551 Pain in right hip: Secondary | ICD-10-CM

## 2019-01-04 DIAGNOSIS — I2510 Atherosclerotic heart disease of native coronary artery without angina pectoris: Secondary | ICD-10-CM | POA: Diagnosis not present

## 2019-01-04 DIAGNOSIS — M545 Low back pain, unspecified: Secondary | ICD-10-CM

## 2019-01-04 DIAGNOSIS — M7061 Trochanteric bursitis, right hip: Secondary | ICD-10-CM | POA: Diagnosis not present

## 2019-01-04 DIAGNOSIS — M25552 Pain in left hip: Secondary | ICD-10-CM

## 2019-01-04 DIAGNOSIS — M7062 Trochanteric bursitis, left hip: Secondary | ICD-10-CM | POA: Diagnosis not present

## 2019-01-04 NOTE — Progress Notes (Signed)
[name] [address], [place] [address] Phone: [phone] Subjective:   [name], am serving as a scribe for Dr. Hulan Saas.   CC: Bilateral hip pain  ELF:[id number]   10/11/2018 Bilateral injections given today.  Discussed icing regimen and home exercise.  Discussed which activities of doing which wants to avoid.  Discussed posture and ergonomics.  Patient will increase activity slowly.  Follow-up again 4 to 8 weeks if pain is not resolved otherwise follow-up as needed  Update 01/04/2019 [name] [name] is a 85 y.o. male coming in with complaint of bilateral hip pain. Pain has slowly been increasing over the past 3 weeks. Having trouble sleeping.  Patient states no radiation down the legs or any numbness or tingling.  Maybe some soreness after sitting long amount of time.  During the day though not much significant pain.  Rates the severity of pain overall is 3 out of 10.     Past Medical History:  Diagnosis Date  . Anxiety   . CAD (coronary artery disease)    a. s/p stent to LAD 2008;  b. abnormal ETT 11/13 with VTach => LHC pLAD 30% ISR, mCFX 30%, dRCA 60-75% which had progressed from previous study in 2010 => FFR of RCA 0.92 => med Rx   . Carotid stenosis    dopplers 1/14:  0-39% bilateral ICA stenosis  . Colon polyps   . Colon polyps    adenomatous  . Diverticular disease of colon   . Hemorrhoids    external and internal  . History of BPH   . HTN (hypertension)   . Hx of echocardiogram    a. Echo 9/11:  EF 55-60%, normal motion, grade 2 diastolic dysfunction, mild MR, mild LAE, mild RAE, PASP 33.  Marland Kitchen Hyperlipidemia   . IBS (irritable bowel syndrome)   . Rhinitis   . Stroke, lacunar ([place]) 08/24/2012   Old, 2011 head mri   Past Surgical History:  Procedure Laterality Date  . APPENDECTOMY  1956  . CORONARY STENT PLACEMENT  2008    Successful PCI of the lesion in the proximal LAD using a Promus   . HERNIA REPAIR Right  2005  . NASAL SINUS SURGERY  1975  . PERCUTANEOUS CORONARY STENT INTERVENTION (PCI-S) N/A 05/03/2012   Procedure: PERCUTANEOUS CORONARY STENT INTERVENTION (PCI-S);  Surgeon: Sherren Mocha, MD;  Location: [facility] CATH LAB;  Service: Cardiovascular;  Laterality: N/A;  . Promus DES ok for 3T MRI  2008   Social History   Socioeconomic History  . Marital status: Married    Spouse name: Not on file  . Number of children: 3  . Years of education: 35  . Highest education level: Not on file  Occupational History  . Occupation: businessman, 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RETIRED  Social Needs  . Financial resource strain: Not hard at all  . Food insecurity    Worry: Never true    Inability: Never true  . Transportation needs    Medical: No    Non-medical: No  Tobacco Use  . Smoking status: Never Smoker  . Smokeless tobacco: Never Used  Substance and Sexual Activity  . Alcohol use: Not Currently    Comment: 4-6 drinks/week  . Drug use: No  . Sexual activity: Yes    Partners: Female  Lifestyle  . Physical activity    Days per week: 6 days    Minutes per session: 40 min  .  Stress: Not at all  Relationships  . Social connections    Talks on phone: More than three times a week    Gets together: More than three times a week    Attends religious service: More than 4 times per year    Active member of club or organization: Yes    Attends meetings of clubs or organizations: More than 4 times per year    Relationship status: Married  Other Topics Concern  . 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 He has two daughters, 1 in [place] 1 in [place] (Dec '12) and a son who works with him. Several grandchildren.Reports that he spends a good deal of time at his home in [facility] which he greatly enjoys and his home in the [place]. Enjoys driving his BMW  convertible when in the mountains.      ACP - Yes CPR, yes for short-term mechanical ventilation for reversible disease. Recommended [email or web address] for consideration.   No Known Allergies Family History  Problem Relation Age of Onset  . Coronary artery disease Father   . Heart attack Father   . Rheum arthritis Mother   . Cancer Brother        ? TYPE  . Colon cancer Neg Hx   . Stomach cancer Neg Hx   . Pancreatic cancer Neg Hx      Current Outpatient Medications (Cardiovascular):  .  lisinopril (PRINIVIL,ZESTRIL) 10 MG tablet, Take 1 tablet (10 mg total) by mouth daily. .  metoprolol succinate (TOPROL XL) 25 MG 24 hr tablet, Take 1 tablet (25 mg total) by mouth daily. .  nitroGLYCERIN (NITROSTAT) 0.4 MG SL tablet, Place 0.4 mg under the tongue every 5 (five) minutes as needed for chest pain (up to 3 doses only). .  rosuvastatin (CRESTOR) 40 MG tablet, TAKE 1 TABLET DAILY  Current Outpatient Medications (Respiratory):  .  fluticasone (FLONASE) 50 MCG/ACT nasal spray, USE 2 SPRAYS IN EACH NOSTRIL DAILY .  ipratropium (ATROVENT) 0.03 % nasal spray, USE 2 SPRAYS INTO BOTH NOSTRILS EVERY 12 HOURS    Current Outpatient Medications (Other):  .  diazepam (VALIUM) 5 MG tablet, TAKE 1 TABLET(5 MG) BY MOUTH EVERY 12 HOURS AS NEEDED FOR ANXIETY .  dutasteride (AVODART) 0.5 MG capsule, Take 0.5 mg by mouth daily.  Marland Kitchen  gabapentin (NEURONTIN) 100 MG capsule, Take 2 capsules (200 mg total) by mouth at bedtime. .  Vitamin D, Ergocalciferol, (DRISDOL) 1.25 MG (50000 UT) CAPS capsule, TAKE 1 CAPSULE EVERY 7 DAYS .  zolpidem (AMBIEN) 10 MG tablet, Take 1 tablet (10 mg total) by mouth at bedtime as needed. for sleep    Past medical history, social, surgical and family history all reviewed in electronic medical record.  No pertanent information unless stated regarding to the chief complaint.   Review of Systems:  No headache, visual changes, nausea, vomiting, diarrhea, constipation,  dizziness, abdominal pain, skin rash, fevers, chills, night sweats, weight loss, swollen lymph nodes, body aches, joint swelling, chest pain, shortness of breath, mood changes.  Positive muscle aches  Objective  Blood pressure 124/66, pulse 79, height 5\' 11"  (1.803 m), weight 145 lb (65.8 kg), SpO2 98 %. f    General: No apparent distress alert and oriented x3 mood and affect normal, dressed appropriately.  HEENT: Pupils equal, extraocular movements intact  Respiratory: Patient's speak in full sentences and  does not appear short of breath  Cardiovascular: No lower extremity edema, non tender, no erythema  Skin: Warm dry intact with no signs of infection or rash on extremities or on axial skeleton.  Abdomen: Soft nontender  Neuro: Cranial nerves II through XII are intact, neurovascularly intact in all extremities with 2+ DTRs and 2+ pulses.  Lymph: No lymphadenopathy of posterior or anterior cervical chain or axillae bilaterally.  Gait normal with good balance and coordination.  MSK:  Non tender with full range of motion and good stability and symmetric strength and tone of shoulders, elbows, wrist, , knee and ankles bilaterally.  Patient has bilateral intention the patient does have some mild atrophy of the hip abductors bilaterally.  Severely tender to palpation of the greater trochanteric area.  Mild swelling noted over the right trochanteric area.  Mild positive Corky Sox on the right.  Negative straight leg test.  Some mild loss of lordosis of the lumbar spine.   Procedure: Real-time Ultrasound Guided Injection of right greater trochanteric bursitis secondary to patient's body habitus Device: GE Logiq Q7 Ultrasound guided injection is preferred based studies that show increased duration, increased effect, greater accuracy, decreased procedural pain, increased response rate, and decreased cost with ultrasound guided versus blind injection.  Verbal informed consent obtained.  Time-out conducted.   Noted no overlying erythema, induration, or other signs of local infection.  Skin prepped in a sterile fashion.  Local anesthesia: Topical Ethyl chloride.  With sterile technique and under real time ultrasound guidance:  Greater trochanteric area was visualized and patient's bursa was noted. A 22-gauge 3 inch needle was inserted and 4 cc of 0.5% Marcaine and 1 cc of Kenalog 40 mg/dL was injected. Pictures taken Completed without difficulty  Pain immediately resolved suggesting accurate placement of the medication.  Advised to call if fevers/chills, erythema, induration, drainage, or persistent bleeding.  Images permanently stored and available for review in the ultrasound unit.  Impression: Technically successful ultrasound guided injection.   Procedure: Real-time Ultrasound Guided Injection of left  greater trochanteric bursitis secondary to patient's body habitus Device: GE Logiq Q7  Ultrasound guided injection is preferred based studies that show increased duration, increased effect, greater accuracy, decreased procedural pain, increased response rate, and decreased cost with ultrasound guided versus blind injection.  Verbal informed consent obtained.  Time-out conducted.  Noted no overlying erythema, induration, or other signs of local infection.  Skin prepped in a sterile fashion.  Local anesthesia: Topical Ethyl chloride.  With sterile technique and under real time ultrasound guidance:  Greater trochanteric area was visualized and patient's bursa was noted. A 22-gauge 3 inch needle was inserted and 4 cc of 0.5% Marcaine and 1 cc of Kenalog 40 mg/dL was injected. Pictures taken Completed without difficulty  Pain immediately resolved suggesting accurate placement of the medication.  Advised to call if fevers/chills, erythema, induration, drainage, or persistent bleeding.  Images permanently stored and available for review in the ultrasound unit.  Impression: Technically successful  ultrasound guided injection.    Impression and Recommendations:     This case required medical decision making of moderate complexity. The above documentation has been reviewed and is accurate and complete Lyndal Pulley, DO       Note: This dictation was prepared with Dragon dictation along with smaller phrase technology. Any transcriptional errors that result from this process are unintentional.

## 2019-01-04 NOTE — Assessment & Plan Note (Signed)
Patient given injection.  Tolerated procedure well.  Discussed icing regimen and home exercises.  Back x-rays ordered to make sure there is no lumbar radiculopathy that could be contributing.  Continue the gabapentin if it is helpful.  Can repeat injections every 10 weeks if necessary.  Follow-up with me as needed

## 2019-01-04 NOTE — Patient Instructions (Signed)
Xray downstairs See me again in

## 2019-01-10 DIAGNOSIS — H2512 Age-related nuclear cataract, left eye: Secondary | ICD-10-CM | POA: Diagnosis not present

## 2019-01-10 DIAGNOSIS — H5310 Unspecified subjective visual disturbances: Secondary | ICD-10-CM | POA: Diagnosis not present

## 2019-01-16 DIAGNOSIS — D1801 Hemangioma of skin and subcutaneous tissue: Secondary | ICD-10-CM | POA: Diagnosis not present

## 2019-01-16 DIAGNOSIS — L821 Other seborrheic keratosis: Secondary | ICD-10-CM | POA: Diagnosis not present

## 2019-01-16 DIAGNOSIS — Z85828 Personal history of other malignant neoplasm of skin: Secondary | ICD-10-CM | POA: Diagnosis not present

## 2019-01-16 DIAGNOSIS — L570 Actinic keratosis: Secondary | ICD-10-CM | POA: Diagnosis not present

## 2019-01-16 DIAGNOSIS — Z85820 Personal history of malignant melanoma of skin: Secondary | ICD-10-CM | POA: Diagnosis not present

## 2019-02-02 DIAGNOSIS — Z23 Encounter for immunization: Secondary | ICD-10-CM | POA: Diagnosis not present

## 2019-02-06 DIAGNOSIS — R351 Nocturia: Secondary | ICD-10-CM | POA: Diagnosis not present

## 2019-02-06 DIAGNOSIS — R3915 Urgency of urination: Secondary | ICD-10-CM | POA: Diagnosis not present

## 2019-02-12 DIAGNOSIS — G5602 Carpal tunnel syndrome, left upper limb: Secondary | ICD-10-CM | POA: Diagnosis not present

## 2019-02-16 ENCOUNTER — Encounter: Payer: Self-pay | Admitting: Family Medicine

## 2019-02-16 ENCOUNTER — Other Ambulatory Visit: Payer: Self-pay

## 2019-02-16 ENCOUNTER — Ambulatory Visit (INDEPENDENT_AMBULATORY_CARE_PROVIDER_SITE_OTHER): Payer: Medicare Other | Admitting: Family Medicine

## 2019-02-16 DIAGNOSIS — M7062 Trochanteric bursitis, left hip: Secondary | ICD-10-CM

## 2019-02-16 DIAGNOSIS — M7061 Trochanteric bursitis, right hip: Secondary | ICD-10-CM

## 2019-02-16 DIAGNOSIS — I2510 Atherosclerotic heart disease of native coronary artery without angina pectoris: Secondary | ICD-10-CM | POA: Diagnosis not present

## 2019-02-16 NOTE — Patient Instructions (Addendum)
Good to see you  See me in 5 weeks for repeat injection

## 2019-02-16 NOTE — Progress Notes (Signed)
[name] [address], [place] [address] Phone: [phone] Subjective:   I [name] [name] am serving as a Education administrator for Dr. Hulan Saas.  I'm seeing this patient by the request  of:    CC: Bilateral hip pain, back pain follow-up  [id number]   01/04/2019 Patient given injection.  Tolerated procedure well.  Discussed icing regimen and home exercises.  Back x-rays ordered to make sure there is no lumbar radiculopathy that could be contributing.  Continue the gabapentin if it is helpful.  Can repeat injections every 10 weeks if necessary.  Follow-up with me as needed  02/16/2019 SOM [name] [name] is a 85 y.o. male coming in with complaint of bilateral hip pain. States the hips are doing well.  Patient is felt like he has done relatively well overall.  Would state 95% better.  Still some mild discomfort on the left hip.  Nothing that stops him from activity but gives him just some discomfort.  Patient is a primary caregiver for his wife who continues to have some difficulty.     Past Medical History:  Diagnosis Date   Anxiety    CAD (coronary artery disease)    a. s/p stent to LAD 2008;  b. abnormal ETT 11/13 with VTach => LHC pLAD 30% ISR, mCFX 30%, dRCA 60-75% which had progressed from previous study in 2010 => FFR of RCA 0.92 => med Rx    Carotid stenosis    dopplers 1/14:  0-39% bilateral ICA stenosis   Colon polyps    Colon polyps    adenomatous   Diverticular disease of colon    Hemorrhoids    external and internal   History of BPH    HTN (hypertension)    Hx of echocardiogram    a. Echo 9/11:  EF 55-60%, normal motion, grade 2 diastolic dysfunction, mild MR, mild LAE, mild RAE, PASP 33.   Hyperlipidemia    IBS (irritable bowel syndrome)    Rhinitis    Stroke, lacunar ([place]) 08/24/2012   Old, 2011 head mri   Past Surgical History:  Procedure Laterality Date   APPENDECTOMY  1956   CORONARY STENT PLACEMENT  2008   Successful PCI of the lesion in the proximal LAD using a Promus    HERNIA REPAIR Right 2005   NASAL SINUS SURGERY  1975   PERCUTANEOUS CORONARY STENT INTERVENTION (PCI-S) N/A 05/03/2012   Procedure: PERCUTANEOUS CORONARY STENT INTERVENTION (PCI-S);  Surgeon: Sherren Mocha, MD;  Location: [facility] CATH LAB;  Service: Cardiovascular;  Laterality: N/A;   Promus DES ok for 3T MRI  2008   Social History   Socioeconomic History   Marital status: Married    Spouse name: Not on file   Number of children: 3   Years of education: 16   Highest education level: Not on file  Occupational History   Occupation: businessman, 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[employer] resource strain: Not hard at all   Food insecurity    Worry: Never true    Inability: Never true   Transportation needs    Medical: No    Non-medical: No  Tobacco Use   Smoking status: Never Smoker   Smokeless tobacco: Never Used  Substance and Sexual Activity   Alcohol use: Not Currently    Comment: 4-6 drinks/week   Drug use: No   Sexual activity: Yes    Partners: Female  Lifestyle   Physical activity  Days per week: 6 days    Minutes per session: 40 min   Stress: Not at all  Relationships   Social connections    Talks on phone: More than three times a week    Gets together: More than three times a week    Attends religious service: More than 4 times per year    Active member of club or organization: Yes    Attends meetings of clubs or organizations: More than 4 times per year    Relationship status: Married  Other Topics Concern   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 He has two daughters, 1 in [place] 1 in [place] (Dec '12) and a son who works with him. Several grandchildren.Reports that he spends a good deal of time at his home in  [facility] which he greatly enjoys and his home in the [place]. Enjoys driving his BMW convertible when in the mountains.      ACP - Yes CPR, yes for short-term mechanical ventilation for reversible disease. Recommended [email or web address] for consideration.   No Known Allergies Family History  Problem Relation Age of Onset   Coronary artery disease Father    Heart attack Father    Rheum arthritis Mother    Cancer Brother        ? TYPE   Colon cancer Neg Hx    Stomach cancer Neg Hx    Pancreatic cancer Neg Hx      Current Outpatient Medications (Cardiovascular):    lisinopril (PRINIVIL,ZESTRIL) 10 MG tablet, Take 1 tablet (10 mg total) by mouth daily.   metoprolol succinate (TOPROL XL) 25 MG 24 hr tablet, Take 1 tablet (25 mg total) by mouth daily.   nitroGLYCERIN (NITROSTAT) 0.4 MG SL tablet, Place 0.4 mg under the tongue every 5 (five) minutes as needed for chest pain (up to 3 doses only).   rosuvastatin (CRESTOR) 40 MG tablet, TAKE 1 TABLET DAILY  Current Outpatient Medications (Respiratory):    fluticasone (FLONASE) 50 MCG/ACT nasal spray, USE 2 SPRAYS IN EACH NOSTRIL DAILY   ipratropium (ATROVENT) 0.03 % nasal spray, USE 2 SPRAYS INTO BOTH NOSTRILS EVERY 12 HOURS    Current Outpatient Medications (Other):    diazepam (VALIUM) 5 MG tablet, TAKE 1 TABLET(5 MG) BY MOUTH EVERY 12 HOURS AS NEEDED FOR ANXIETY   dutasteride (AVODART) 0.5 MG capsule, Take 0.5 mg by mouth daily.    gabapentin (NEURONTIN) 100 MG capsule, Take 2 capsules (200 mg total) by mouth at bedtime.   Vitamin D, Ergocalciferol, (DRISDOL) 1.25 MG (50000 UT) CAPS capsule, TAKE 1 CAPSULE EVERY 7 DAYS   zolpidem (AMBIEN) 10 MG tablet, Take 1 tablet (10 mg total) by mouth at bedtime as needed. for sleep    Past medical history, social, surgical and family history all reviewed in electronic medical record.  No pertanent information unless stated regarding to the chief complaint.    Review of Systems:  No headache, visual changes, nausea, vomiting, diarrhea, constipation, dizziness, abdominal pain, skin rash, fevers, chills, night sweats, weight loss, swollen lymph nodes, body aches, joint swelling, chest pain, shortness of breath, mood changes.  Positive muscle aches  Objective  Blood pressure 110/68, pulse 60, height 5\' 11"  (1.803 m), weight 142 lb (64.4 kg), SpO2 96 %.    General: No apparent distress alert and oriented x3 mood and affect normal, dressed appropriately.  HEENT: Pupils equal, extraocular movements intact  Respiratory: Patient's speak in full sentences and does not appear short of breath  Cardiovascular: No lower extremity edema, non tender, no erythema  Skin: Warm dry intact with no signs of infection or rash on extremities or on axial skeleton.  Abdomen: Soft nontender  Neuro: Cranial nerves II through XII are intact, neurovascularly intact in all extremities with 2+ DTRs and 2+ pulses.  Lymph: No lymphadenopathy of posterior or anterior cervical chain or axillae bilaterally.  Gait normal with good balance and coordination.  MSK:  tender with full range of motion and good stability and symmetric strength and tone of shoulders, elbows, wrist, , knee and ankles bilaterally.  Mild arthritic changes overall. Hip exam shows a patient does have some pain still over the lateral aspect of the left hip greater trochanteric area.  Negative straight leg test.  Some mild tenderness to palpation in the paraspinal musculature lumbar spine.   Impression and Recommendations:     The above documentation has been reviewed and is accurate and complete Lyndal Pulley, DO       Note: This dictation was prepared with Dragon dictation along with smaller phrase technology. Any transcriptional errors that result from this process are unintentional.

## 2019-02-17 ENCOUNTER — Encounter: Payer: Self-pay | Admitting: Family Medicine

## 2019-02-17 NOTE — Assessment & Plan Note (Signed)
Improvement noted.  Has been doing relatively well.  Discussed icing regimen and home exercises and can repeat injections every 10 weeks.  Patient is in agreement with the plan.  Follow-up again in 6 weeks

## 2019-02-26 NOTE — Progress Notes (Signed)
Patient ID: [name] [name], male   DOB: Aug 30, 1932, 85 y.o.   MRN: [id number]     85 y.o. history of CAD. Stent to LAD in 2008.  ETT in 2013 abnormal with NSVT. Cath 05/01/12 30% ISR LaD and distal RCA 60-75% with FFR CT .92 Rx medically Myovue done 04/27/17 reviewed and normal with no ischemia study not gated due to PAC;s EF normal echo 06/25/15 55-60% LA only mildly dilated   Continues to have episodes of transient visual loss.  ? Occular migraines Been going on for over 3-4 years  Seen by neurology And MRI/MRA no abnormality and EEG no epileptic foci  01/11/14  1-39% bilateral carotid disease   Daughter in [place] has a hypochondriac for husband Daughter in [place] not working as [place] but he likes her husband better Son works with him and may open a Administrator on [place] Had a nice  trip to [place] and [place] last year    No chest pain   Had some muscular sounding left arm pain improved with injection by Charlann Boxer For carpal tunnel  ROS: Denies fever, malais, weight loss, blurry vision, decreased visual acuity, cough, sputum, SOB, hemoptysis, pleuritic pain, palpitaitons, heartburn, abdominal pain, melena, lower extremity edema, claudication, or rash.  All other systems reviewed and negative  General: BP 104/64    Pulse 67    Ht 5\' 11"  (1.803 m)    Wt 141 lb (64 kg)    SpO2 98%    BMI 19.67 kg/m  Affect appropriate Healthy:  appears stated age 14: normal Neck supple with no adenopathy JVP normal no bruits no thyromegaly Lungs clear with no wheezing and good diaphragmatic motion Heart:  S1/S2 no murmur, no rub, gallop or click PMI normal Abdomen: benighn, BS positve, no tenderness, no AAA no bruit.  No HSM or HJR Distal pulses intact with no bruits No edema Neuro non-focal Skin warm and dry No muscular weakness     Current Outpatient Medications  Medication Sig Dispense Refill   diazepam (VALIUM) 5 MG tablet TAKE 1 TABLET(5 MG) BY MOUTH EVERY 12 HOURS AS NEEDED FOR  ANXIETY 20 tablet 1   dutasteride (AVODART) 0.5 MG capsule Take 0.5 mg by mouth daily.      fluticasone (FLONASE) 50 MCG/ACT nasal spray USE 2 SPRAYS IN EACH NOSTRIL DAILY 32 g 11   gabapentin (NEURONTIN) 100 MG capsule Take 2 capsules (200 mg total) by mouth at bedtime. 60 capsule 3   ipratropium (ATROVENT) 0.03 % nasal spray USE 2 SPRAYS INTO BOTH NOSTRILS EVERY 12 HOURS 90 mL 2   lisinopril (PRINIVIL,ZESTRIL) 10 MG tablet Take 1 tablet (10 mg total) by mouth daily. 7 tablet 0   metoprolol succinate (TOPROL XL) 25 MG 24 hr tablet Take 1 tablet (25 mg total) by mouth daily. 90 tablet 1   nitroGLYCERIN (NITROSTAT) 0.4 MG SL tablet Place 0.4 mg under the tongue every 5 (five) minutes as needed for chest pain (up to 3 doses only).     rosuvastatin (CRESTOR) 40 MG tablet TAKE 1 TABLET DAILY 90 tablet 2   Vitamin D, Ergocalciferol, (DRISDOL) 1.25 MG (50000 UT) CAPS capsule TAKE 1 CAPSULE EVERY 7 DAYS 12 capsule 0   zolpidem (AMBIEN) 10 MG tablet Take 1 tablet (10 mg total) by mouth at bedtime as needed. for sleep 90 tablet 1   No current facility-administered medications for this visit.     Allergies  Patient has no known allergies.  Electrocardiogram:  04/18/14  SR rate 71  ICRBBB old IMI possible nonspecific ST T wave changes  no change from 2014  06/10/15  SR rate 76 PAC/PVC ICRBBB old IMI No afib.   02/18/16  SR ICRBBB old IMI  06/15/17 SR rte 29 RBBB   Assessment and Plan  CAD: Stable with no angina and good activity level.  Continue medical Rx Stent LAD 2008  Moderate distal RCA with normal flow wire 04/2012  Non ischemic myovue 04/27/17   HLD : on generic crestor now  Lab Results  Component Value Date   LDLCALC 40 11/28/2018    GERD: improved on Protonix discussed low carb diet   Insmonia:  Chronic on ambien f/u Dr Ronnald Ramp   Visual Disturbance:  MRI/MRA normal EEG normal has been seen by neurology    F/U with me in 6 months   [name]

## 2019-03-01 ENCOUNTER — Encounter: Payer: Self-pay | Admitting: Cardiovascular Disease

## 2019-03-01 ENCOUNTER — Ambulatory Visit (INDEPENDENT_AMBULATORY_CARE_PROVIDER_SITE_OTHER): Payer: Medicare Other | Admitting: Cardiovascular Disease

## 2019-03-01 ENCOUNTER — Other Ambulatory Visit: Payer: Self-pay

## 2019-03-01 ENCOUNTER — Encounter

## 2019-03-01 VITALS — BP 104/64 | HR 67 | Ht 71.0 in | Wt 141.0 lb

## 2019-03-01 DIAGNOSIS — I2510 Atherosclerotic heart disease of native coronary artery without angina pectoris: Secondary | ICD-10-CM | POA: Diagnosis not present

## 2019-03-01 MED ORDER — NITROGLYCERIN 0.4 MG SL SUBL: 0.4000 mg | SUBLINGUAL_TABLET | SUBLINGUAL | Status: DC | PRN

## 2019-03-01 MED ORDER — METOPROLOL SUCCINATE ER 25 MG PO TB24: 25.0000 mg | ORAL_TABLET | Freq: Every day | ORAL | 1 refills | Status: DC

## 2019-03-01 MED ORDER — LISINOPRIL 10 MG PO TABS: 10.0000 mg | ORAL_TABLET | Freq: Every day | ORAL | 0 refills | Status: DC

## 2019-03-01 NOTE — Patient Instructions (Signed)
Medication Instructions:   *If you need a refill on your cardiac medications before your next appointment, please call your pharmacy*  Lab Work:  If you have labs (blood work) drawn today and your tests are completely normal, you will receive your results only by: Marland Kitchen MyChart Message (if you have MyChart) OR . A paper copy in the mail If you have any lab test that is abnormal or we need to change your treatment, we will call you to review the results.  Testing/Procedures:   Follow-Up: At [facility], you and your health needs are our priority.  As part of our continuing mission to provide you with exceptional heart care, we have created designated Provider Care Teams.  These Care Teams include your primary Cardiologist (physician) and Advanced Practice Providers (APPs -  Physician Assistants and Nurse Practitioners) who all work together to provide you with the care you need, when you need it.  Your next appointment:   6 months  The format for your next appointment:   In Person  Provider:   You may see [name], MD or one of the following Advanced Practice Providers on your designated Care Team:    Truitt Merle, NP  Cecilie Kicks, NP  Kathyrn Drown, NP

## 2019-03-06 ENCOUNTER — Other Ambulatory Visit: Payer: Self-pay | Admitting: Internal Medicine

## 2019-03-06 DIAGNOSIS — E559 Vitamin D deficiency, unspecified: Secondary | ICD-10-CM

## 2019-03-07 ENCOUNTER — Other Ambulatory Visit: Payer: Self-pay | Admitting: Cardiovascular Disease

## 2019-03-07 MED ORDER — NITROGLYCERIN 0.4 MG SL SUBL: 0.4000 mg | SUBLINGUAL_TABLET | SUBLINGUAL | 7 refills | Status: DC | PRN

## 2019-03-07 NOTE — Telephone Encounter (Signed)
Pt's medication was sent to pt's pharmacy as requested. Confirmation received.  °

## 2019-03-21 ENCOUNTER — Ambulatory Visit: Payer: Self-pay

## 2019-03-21 ENCOUNTER — Encounter: Payer: Self-pay | Admitting: Family Medicine

## 2019-03-21 ENCOUNTER — Other Ambulatory Visit: Payer: Self-pay

## 2019-03-21 ENCOUNTER — Ambulatory Visit (INDEPENDENT_AMBULATORY_CARE_PROVIDER_SITE_OTHER): Payer: Medicare Other | Admitting: Family Medicine

## 2019-03-21 VITALS — BP 102/76 | HR 63 | Ht 71.0 in | Wt 145.0 lb

## 2019-03-21 DIAGNOSIS — M25552 Pain in left hip: Secondary | ICD-10-CM

## 2019-03-21 DIAGNOSIS — M7061 Trochanteric bursitis, right hip: Secondary | ICD-10-CM

## 2019-03-21 DIAGNOSIS — M7062 Trochanteric bursitis, left hip: Secondary | ICD-10-CM | POA: Diagnosis not present

## 2019-03-21 DIAGNOSIS — I2510 Atherosclerotic heart disease of native coronary artery without angina pectoris: Secondary | ICD-10-CM | POA: Diagnosis not present

## 2019-03-21 NOTE — Patient Instructions (Signed)
Good to see you See me when you need me [phone] 

## 2019-03-21 NOTE — Progress Notes (Signed)
[name] [place], [place] [address] Phone: [phone] Subjective:   I [name] [name] am serving as a Education administrator for Dr. Hulan Saas.  I'm seeing this patient by the request  of:    CC: Left hip pain  [id number]   02/16/2019 Improvement noted.  Has been doing relatively well.  Discussed icing regimen and home exercises and can repeat injections every 10 weeks.  Patient is in agreement with the plan.  Follow-up again in 6 weeks  03/21/2019 [name] [name] is a 85 y.o. male coming in with complaint of bilateral hip pain. Patient states his hips are doing well.  Patient feels like he has had some mild increase in discomfort over the lateral aspect of the left hip only.  No radiation of the pain.  Patient states that it is not often he would consider another injection.  Patient denies any radiation down the leg, rates the severity of pain is 5 out of 10 but sometimes can have severe pain at night.    Past Medical History:  Diagnosis Date  . Anxiety   . CAD (coronary artery disease)    a. s/p stent to LAD 2008;  b. abnormal ETT 11/13 with VTach => LHC pLAD 30% ISR, mCFX 30%, dRCA 60-75% which had progressed from previous study in 2010 => FFR of RCA 0.92 => med Rx   . Carotid stenosis    dopplers 1/14:  0-39% bilateral ICA stenosis  . Colon polyps   . Colon polyps    adenomatous  . Diverticular disease of colon   . Hemorrhoids    external and internal  . History of BPH   . HTN (hypertension)   . Hx of echocardiogram    a. Echo 9/11:  EF 55-60%, normal motion, grade 2 diastolic dysfunction, mild MR, mild LAE, mild RAE, PASP 33.  Marland Kitchen Hyperlipidemia   . IBS (irritable bowel syndrome)   . Rhinitis   . Stroke, lacunar ([place]) 08/24/2012   Old, 2011 head mri   Past Surgical History:  Procedure Laterality Date  . APPENDECTOMY  1956  . CORONARY STENT PLACEMENT  2008    Successful PCI of the lesion in the proximal LAD using a Promus   . HERNIA  REPAIR Right 2005  . NASAL SINUS SURGERY  1975  . PERCUTANEOUS CORONARY STENT INTERVENTION (PCI-S) N/A 05/03/2012   Procedure: PERCUTANEOUS CORONARY STENT INTERVENTION (PCI-S);  Surgeon: Sherren Mocha, MD;  Location: [facility] CATH LAB;  Service: Cardiovascular;  Laterality: N/A;  . Promus DES ok for 3T MRI  2008   Social History   Socioeconomic History  . Marital status: Married    Spouse name: Not on file  . Number of children: 3  . Years of education: 58  . Highest education level: Not on file  Occupational History  . Occupation: businessman, 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RETIRED  Social Needs  . Financial resource strain: Not hard at all  . Food insecurity    Worry: Never true    Inability: Never true  . Transportation needs    Medical: No    Non-medical: No  Tobacco Use  . Smoking status: Never Smoker  . Smokeless tobacco: Never Used  Substance and Sexual Activity  . Alcohol use: Not Currently    Comment: 4-6 drinks/week  . Drug use: No  . Sexual activity: Yes    Partners: Female  Lifestyle  . Physical activity    Days per  week: 6 days    Minutes per session: 40 min  . Stress: Not at all  Relationships  . Social connections    Talks on phone: More than three times a week    Gets together: More than three times a week    Attends religious service: More than 4 times per year    Active member of club or organization: Yes    Attends meetings of clubs or organizations: More than 4 times per year    Relationship status: Married  Other Topics Concern  . 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 He has two daughters, 1 in [place] 1 in [place] (Dec '12) and a son who works with him. Several grandchildren.Reports that he spends a good deal of time at his home in [facility] which he greatly enjoys and his home in the [place]. Enjoys  driving his BMW convertible when [place] the [place].      ACP - Yes CPR, yes for short-term mechanical ventilation for reversible disease. Recommended [email or web address] for consideration.   No Known Allergies Family History  Problem Relation Age of Onset  . Coronary artery disease Father   . Heart attack Father   . Rheum arthritis Mother   . Cancer Brother        ? TYPE  . Colon cancer Neg Hx   . Stomach cancer Neg Hx   . Pancreatic cancer Neg Hx      Current Outpatient Medications (Cardiovascular):  .  lisinopril (ZESTRIL) 10 MG tablet, Take 1 tablet (10 mg total) by mouth daily. .  metoprolol succinate (TOPROL XL) 25 MG 24 hr tablet, Take 1 tablet (25 mg total) by mouth daily. .  nitroGLYCERIN (NITROSTAT) 0.4 MG SL tablet, Place 1 tablet (0.4 mg total) under the tongue every 5 (five) minutes as needed for chest pain (up to 3 doses only). .  rosuvastatin (CRESTOR) 40 MG tablet, TAKE 1 TABLET DAILY  Current Outpatient Medications (Respiratory):  .  fluticasone (FLONASE) 50 MCG/ACT nasal spray, USE 2 SPRAYS IN EACH NOSTRIL DAILY .  ipratropium (ATROVENT) 0.03 % nasal spray, USE 2 SPRAYS INTO BOTH NOSTRILS EVERY 12 HOURS    Current Outpatient Medications (Other):  .  diazepam (VALIUM) 5 MG tablet, TAKE 1 TABLET(5 MG) BY MOUTH EVERY 12 HOURS AS NEEDED FOR ANXIETY .  dutasteride (AVODART) 0.5 MG capsule, Take 0.5 mg by mouth daily.  Marland Kitchen  gabapentin (NEURONTIN) 100 MG capsule, Take 2 capsules (200 mg total) by mouth at bedtime. .  Vitamin D, Ergocalciferol, (DRISDOL) 1.25 MG (50000 UT) CAPS capsule, TAKE 1 CAPSULE EVERY 7 DAYS .  zolpidem (AMBIEN) 10 MG tablet, Take 1 tablet (10 mg total) by mouth at bedtime as needed. for sleep    Past medical history, social, surgical and family history all reviewed in electronic medical record.  No pertanent information unless stated regarding to the chief complaint.   Review of Systems:  No headache, visual changes, nausea, vomiting,  diarrhea, constipation, dizziness, abdominal pain, skin rash, fevers, chills, night sweats, weight loss, swollen lymph nodes, body aches, joint swelling, muscle aches, chest pain, shortness of breath, mood changes.   Objective  Blood pressure 102/76, pulse 63, height 5\' 11"  (1.803 m), weight 145 lb (65.8 kg), SpO2 96 %.   General: No apparent distress alert and oriented x3 mood and affect normal, dressed appropriately.  HEENT:  Pupils equal, extraocular movements intact  Respiratory: Patient's speak in full sentences and does not appear short of breath  Cardiovascular: No lower extremity edema, non tender, no erythema  Skin: Warm dry intact with no signs of infection or rash on extremities or on axial skeleton.  Abdomen: Soft nontender  Neuro: Cranial nerves II through XII are intact, neurovascularly intact in all extremities with 2+ DTRs and 2+ pulses.  Lymph: No lymphadenopathy of posterior or anterior cervical chain or axillae bilaterally.  Gait normal with good balance and coordination.  MSK:  tender with full range of motion and good stability and symmetric strength and tone of shoulders, elbows, wrist, knee and ankles bilaterally.  Left hip exam shows the patient does have tenderness to palpation over the greater trochanteric area.  The patient does have a negative straight leg test today.  Positive FABER test.  5 out of 5 strength in lower extremities bilaterally.   Procedure: Real-time Ultrasound Guided Injection of left  greater trochanteric bursitis secondary to patient's body habitus Device: GE Logiq Q7  Ultrasound guided injection is preferred based studies that show increased duration, increased effect, greater accuracy, decreased procedural pain, increased response rate, and decreased cost with ultrasound guided versus blind injection.  Verbal informed consent obtained.  Time-out conducted.  Noted no overlying erythema, induration, or other signs of local infection.  Skin prepped  in a sterile fashion.  Local anesthesia: Topical Ethyl chloride.  With sterile technique and under real time ultrasound guidance:  Greater trochanteric area was visualized and patient's bursa was noted. A 22-gauge 3 inch needle was inserted and 4 cc of 0.5% Marcaine and 1 cc of Kenalog 40 mg/dL was injected. Pictures taken Completed without difficulty  Pain immediately resolved suggesting accurate placement of the medication.  Advised to call if fevers/chills, erythema, induration, drainage, or persistent bleeding.  Images permanently stored and available for review in the ultrasound unit.  Impression: Technically successful ultrasound guided injection.    Impression and Recommendations:     This case required medical decision making of moderate complexity. The above documentation has been reviewed and is accurate and complete Lyndal Pulley, DO       Note: This dictation was prepared with Dragon dictation along with smaller phrase technology. Any transcriptional errors that result from this process are unintentional.

## 2019-03-21 NOTE — Assessment & Plan Note (Signed)
Patient given injection today and tolerated the procedure well.  Discussed which activities to do which wants to avoid.  Patient will increase activity as tolerated.  Follow-up again in 4 to 8 weeks

## 2019-05-07 ENCOUNTER — Encounter: Payer: Self-pay | Admitting: Internal Medicine

## 2019-05-21 ENCOUNTER — Telehealth: Payer: Self-pay | Admitting: Internal Medicine

## 2019-05-21 NOTE — Telephone Encounter (Signed)
Can you have pt schedule visit to discuss need for new rx?

## 2019-05-21 NOTE — Telephone Encounter (Signed)
Appointment has been made for tomorrow 1/5.

## 2019-05-21 NOTE — Telephone Encounter (Signed)
Medication Refill - Medication: Pt states he had rx for Alprazolam previously for anxiety and would like to have a new rx for it again. Please advise.    Preferred Pharmacy:  [facility] [place], [address] Phone:  [phone]  Fax:  [phone]       Pt was advised that RX refills may take up to 3 business days. We ask that you follow-up with your pharmacy.

## 2019-05-22 ENCOUNTER — Encounter: Payer: Self-pay | Admitting: Internal Medicine

## 2019-05-22 ENCOUNTER — Ambulatory Visit (INDEPENDENT_AMBULATORY_CARE_PROVIDER_SITE_OTHER): Payer: Medicare Other | Admitting: Internal Medicine

## 2019-05-22 ENCOUNTER — Other Ambulatory Visit: Payer: Self-pay

## 2019-05-22 VITALS — BP 138/70 | HR 66 | Temp 98.4°F | Ht 71.0 in | Wt 142.0 lb

## 2019-05-22 DIAGNOSIS — I10 Essential (primary) hypertension: Secondary | ICD-10-CM

## 2019-05-22 DIAGNOSIS — Z23 Encounter for immunization: Secondary | ICD-10-CM | POA: Diagnosis not present

## 2019-05-22 DIAGNOSIS — F411 Generalized anxiety disorder: Secondary | ICD-10-CM | POA: Diagnosis not present

## 2019-05-22 MED ORDER — ALPRAZOLAM 0.25 MG PO TABS: 0.2500 mg | ORAL_TABLET | Freq: Two times a day (BID) | ORAL | 3 refills | Status: DC | PRN

## 2019-05-22 NOTE — Patient Instructions (Signed)

## 2019-05-22 NOTE — Progress Notes (Signed)
Subjective:  Patient ID: [name] [name], male    DOB: 08-23-1932  Age: 86 y.o. MRN: [id number]  CC: Hypertension  This visit occurred during the SARS-CoV-2 public health emergency.  Safety protocols were in place, including screening questions prior to the visit, additional usage of staff PPE, and extensive cleaning of exam room while observing appropriate contact time as indicated for disinfecting solutions.    HPI [name] [name] presents for f/up -  A friend of his recently died and this has caused him to feel anxiety and a panicky sensation.  He has an old prescription of Xanax which he has taken to control the symptoms but he requests a refill.  Outpatient Medications Prior to Visit  Medication Sig Dispense Refill  . fluticasone (FLONASE) 50 MCG/ACT nasal spray USE 2 SPRAYS IN EACH NOSTRIL DAILY 32 g 11  . ipratropium (ATROVENT) 0.03 % nasal spray USE 2 SPRAYS INTO BOTH NOSTRILS EVERY 12 HOURS 90 mL 2  . lisinopril (ZESTRIL) 10 MG tablet Take 1 tablet (10 mg total) by mouth daily. 7 tablet 0  . metoprolol succinate (TOPROL XL) 25 MG 24 hr tablet Take 1 tablet (25 mg total) by mouth daily. 90 tablet 1  . rosuvastatin (CRESTOR) 40 MG tablet TAKE 1 TABLET DAILY 90 tablet 2  . Vitamin D, Ergocalciferol, (DRISDOL) 1.25 MG (50000 UT) CAPS capsule TAKE 1 CAPSULE EVERY 7 DAYS 12 capsule 0  . zolpidem (AMBIEN) 10 MG tablet Take 1 tablet (10 mg total) by mouth at bedtime as needed. for sleep 90 tablet 1  . nitroGLYCERIN (NITROSTAT) 0.4 MG SL tablet Place 1 tablet (0.4 mg total) under the tongue every 5 (five) minutes as needed for chest pain (up to 3 doses only). (Patient not taking: Reported on 05/22/2019) 25 tablet 7  . diazepam (VALIUM) 5 MG tablet TAKE 1 TABLET(5 MG) BY MOUTH EVERY 12 HOURS AS NEEDED FOR ANXIETY (Patient not taking: Reported on 05/22/2019) 20 tablet 1  . dutasteride (AVODART) 0.5 MG capsule Take 0.5 mg by mouth daily.     Marland Kitchen gabapentin (NEURONTIN) 100 MG capsule Take 2 capsules  (200 mg total) by mouth at bedtime. (Patient not taking: Reported on 05/22/2019) 60 capsule 3   No facility-administered medications prior to visit.    ROS Review of Systems  Constitutional: Negative for diaphoresis, fatigue and unexpected weight change.  HENT: Negative.   Eyes: Negative for visual disturbance.  Respiratory: Negative for cough, chest tightness, shortness of breath and wheezing.   Cardiovascular: Negative for chest pain, palpitations and leg swelling.  Gastrointestinal: Negative for abdominal pain, diarrhea and nausea.  Endocrine: Negative.   Genitourinary: Negative.   Musculoskeletal: Negative.   Skin: Negative.   Neurological: Negative.  Negative for dizziness, weakness and light-headedness.  Hematological: Negative.   Psychiatric/Behavioral: Negative for confusion, decreased concentration, dysphoric mood, sleep disturbance and suicidal ideas. The patient is nervous/anxious.     Objective:  BP 138/70 (BP Location: Left Arm, Patient Position: Sitting, Cuff Size: Normal)   Pulse 66   Temp 98.4 F (36.9 C) (Oral)   Ht 5\' 11"  (1.803 m)   Wt 142 lb (64.4 kg)   SpO2 97%   BMI 19.80 kg/m   BP Readings from Last 3 Encounters:  05/22/19 138/70  03/21/19 102/76  03/01/19 104/64    Wt Readings from Last 3 Encounters:  05/22/19 142 lb (64.4 kg)  03/21/19 145 lb (65.8 kg)  03/01/19 141 lb (64 kg)    Physical Exam Vitals reviewed.  Constitutional:      Appearance: Normal appearance.  HENT:     Nose: Nose normal.  Eyes:     General: No scleral icterus.    Conjunctiva/sclera: Conjunctivae normal.  Cardiovascular:     Rate and Rhythm: Normal rate and regular rhythm.     Heart sounds: No murmur.  Pulmonary:     Effort: Pulmonary effort is normal.     Breath sounds: No stridor. No wheezing, rhonchi or rales.  Abdominal:     General: Abdomen is flat. Bowel sounds are normal. There is no distension.     Palpations: There is no hepatomegaly or splenomegaly.      Tenderness: There is no abdominal tenderness.  Musculoskeletal:        General: Normal range of motion.     Cervical back: Neck supple.     Right lower leg: No edema.     Left lower leg: No edema.  Lymphadenopathy:     Cervical: No cervical adenopathy.  Skin:    General: Skin is warm.  Neurological:     General: No focal deficit present.     Mental Status: He is alert and oriented to person, place, and time.  Psychiatric:        Mood and Affect: Mood normal.        Behavior: Behavior normal.        Thought Content: Thought content normal.        Judgment: Judgment normal.     Lab Results  Component Value Date   WBC 5.1 11/28/2018   HGB 14.0 11/28/2018   HCT 41.2 11/28/2018   PLT 199.0 11/28/2018   GLUCOSE 87 11/28/2018   CHOL 122 11/28/2018   TRIG 89.0 11/28/2018   HDL 63.40 11/28/2018   LDLDIRECT 132.2 03/10/2007   LDLCALC 40 11/28/2018   ALT 22 11/28/2018   AST 21 11/28/2018   NA 139 11/28/2018   K 4.1 11/28/2018   CL 107 11/28/2018   CREATININE 1.04 11/28/2018   BUN 19 11/28/2018   CO2 25 11/28/2018   TSH 2.35 11/28/2018   PSA 0.44 05/10/2011   INR 1.0 04/27/2012    DG Lumbar Spine Complete  Result Date: 01/04/2019 CLINICAL DATA:  Low back pain. EXAM: LUMBAR SPINE - COMPLETE 4+ VIEW COMPARISON:  CT abdomen pelvis-12/30/2016 FINDINGS: There are 5 non rib-bearing lumbar type vertebral bodies. There is a mild S-shaped scoliotic curvature of the thoracolumbar spine with dominant caudal component convex the right measuring approximately 10 degrees (as measured from the superior endplate of L1 to the inferior endplate of L4). There is minimal (approximately 3 4 mm) of anterolisthesis of L2 upon L3, L3 upon L4 and L4 upon L5. No anterolisthesis. No definite pars defects. Mild (approximately 25%) compression deformities involving the T12 and L1 vertebral bodies are unchanged since the abdominal CT performed 12/2016. Remaining lumbar vertebral body heights are preserved.  Mild to moderate multilevel lumbar spine DDD, worse at L5-S1 with disc space height loss, endplate irregularity and sclerosis. Note is made of a prominent syndesmophyte about the left lateral aspect of the L2-L3 intervertebral disc space, similar to remote abdominal CT. Atherosclerotic plaque within the abdominal aorta. Vascular calcifications overlie the left upper abdomen and lower pelvis bilaterally. Moderate to large colonic stool burden without evidence of enteric obstruction. IMPRESSION: 1. No acute findings. 2. Mild-to-moderate multilevel lumbar spine DDD, worse at L5-S1, likely progressed compared to remote abdominal CT performed 12/2016. Electronically Signed   By: Eldridge Abrahams.D.  On: 01/04/2019 14:29   [place] LIMITED JOINT SPACE STRUCTURES LOW RIGHT  Result Date: 01/20/2019 Procedure: Real-time Ultrasound Guided Injection of right greater trochanteric bursitis secondary to patient's body habitus Device: GE Logiq Q7 Ultrasound guided injection is preferred based studies that show increased duration, increased effect, greater accuracy, decreased procedural pain, increased response rate, and decreased cost with ultrasound guided versus blind injection. Verbal informed consent obtained. Time-out conducted. Noted no overlying erythema, induration, or other signs of local infection. Skin prepped in a sterile fashion. Local anesthesia: Topical Ethyl chloride. With sterile technique and under real time ultrasound guidance: Greater trochanteric area was visualized and patient's bursa was noted. A 22-gauge 3 inch needle was inserted and 4 cc of 0.5% Marcaine and 1 cc of Kenalog 40 mg/dL was injected. Pictures taken Completed without difficulty Pain immediately resolved suggesting accurate placement of the medication. Advised to call if fevers/chills, erythema, induration, drainage, or persistent bleeding. Images permanently stored and available for review in the ultrasound unit. Impression: Technically successful  ultrasound guided injection. Procedure: Real-time Ultrasound Guided Injection of left greater trochanteric bursitis secondary to patient's body habitus Device: GE Logiq Q7 Ultrasound guided injection is preferred based studies that show increased duration, increased effect, greater accuracy, decreased procedural pain, increased response rate, and decreased cost with ultrasound guided versus blind injection. Verbal informed consent obtained. Time-out conducted. Noted no overlying erythema, induration, or other signs of local infection. Skin prepped in a sterile fashion. Local anesthesia: Topical Ethyl chloride. With sterile technique and under real time ultrasound guidance: Greater trochanteric area was visualized and patient's bursa was noted. A 22-gauge 3 inch needle was inserted and 4 cc of 0.5% Marcaine and 1 cc of Kenalog 40 mg/dL was injected. Pictures taken Completed without difficulty Pain immediately resolved suggesting accurate placement of the medication. Advised to call if fevers/chills, erythema, induration, drainage, or persistent bleeding. Images permanently stored and available for review in the ultrasound unit. Impression: Technically successful ultrasound guided injection.    Assessment & Plan:   Chaplin was seen today for hypertension.  Diagnoses and all orders for this visit:  Essential hypertension problem- His blood pressure is adequately well controlled.  I will monitor his electrolytes and renal function.   -     Basic metabolic panel  GAD (generalized anxiety disorder) -     ALPRAZolam (XANAX) 0.25 MG tablet; Take 1 tablet (0.25 mg total) by mouth 2 (two) times daily as needed for anxiety.  Need for pneumococcal vaccination -     Pneumococcal polysaccharide vaccine 23-valent greater than or equal to 2yo subcutaneous/IM   I have discontinued Macky Lower "Cooper"'s Avodart, gabapentin, and diazepam. I am also having him start on ALPRAZolam. Additionally, I am having him  maintain his fluticasone, ipratropium, rosuvastatin, zolpidem, lisinopril, metoprolol succinate, Vitamin D (Ergocalciferol), and nitroGLYCERIN.  Meds ordered this encounter  Medications  . ALPRAZolam (XANAX) 0.25 MG tablet    Sig: Take 1 tablet (0.25 mg total) by mouth 2 (two) times daily as needed for anxiety.    Dispense:  60 tablet    Refill:  3     Follow-up: Return in about 6 months (around 11/19/2019).  Scarlette Calico, MD

## 2019-05-28 ENCOUNTER — Encounter: Payer: Self-pay | Admitting: Internal Medicine

## 2019-06-11 ENCOUNTER — Telehealth: Payer: Self-pay | Admitting: Cardiovascular Disease

## 2019-06-11 NOTE — Telephone Encounter (Signed)
I spoke to the patient who is calling because last night he experienced (4 episodes of diarrhea), sweating, reflux and a pain in his left elbow joint.  He denies CP or SOB.   His BP was decreased to 94/53 HR 82 @ 8:35 this morning and 106/66 HR 77 @ 9:05.  I advised him to hydrate himself this morning, eat and continue to monitor throughout the day.  He may call later with an update.

## 2019-06-11 NOTE — Telephone Encounter (Signed)
Pt c/o BP issue: STAT if pt c/o blurred vision, one-sided weakness or slurred speech  1. What are your last 5 BP readings? 94/53 and 95/51  2. Are you having any other symptoms (ex. Dizziness, headache, blurred vision, passed out)? Patient states that he did not sleep well at at last night. He had pain in his left arm specifically at the elbow and had diarrhea. Patient denies any other symptoms.   3. What is your BP issue? BP is low

## 2019-06-12 ENCOUNTER — Other Ambulatory Visit: Payer: Self-pay | Admitting: Internal Medicine

## 2019-06-12 DIAGNOSIS — E559 Vitamin D deficiency, unspecified: Secondary | ICD-10-CM

## 2019-06-12 NOTE — Telephone Encounter (Signed)
Called patient to check on him. Patient stated he did not know what happen but he is feeling fine now. Made patient a follow up appointment for his 6 month follow up in April. Encouraged patient to call office if he has any other concerns. Patient verbalized understanding and thanked me for the call.

## 2019-06-13 ENCOUNTER — Other Ambulatory Visit: Payer: Self-pay | Admitting: Internal Medicine

## 2019-06-13 ENCOUNTER — Encounter: Payer: Self-pay | Admitting: Internal Medicine

## 2019-06-14 ENCOUNTER — Encounter: Payer: Self-pay | Admitting: Internal Medicine

## 2019-06-14 ENCOUNTER — Other Ambulatory Visit (INDEPENDENT_AMBULATORY_CARE_PROVIDER_SITE_OTHER): Payer: Medicare Other

## 2019-06-14 ENCOUNTER — Other Ambulatory Visit: Payer: Self-pay | Admitting: Internal Medicine

## 2019-06-14 ENCOUNTER — Telehealth: Payer: Self-pay

## 2019-06-14 DIAGNOSIS — I10 Essential (primary) hypertension: Secondary | ICD-10-CM | POA: Diagnosis not present

## 2019-06-14 DIAGNOSIS — E559 Vitamin D deficiency, unspecified: Secondary | ICD-10-CM | POA: Diagnosis not present

## 2019-06-14 DIAGNOSIS — F5104 Psychophysiologic insomnia: Secondary | ICD-10-CM

## 2019-06-14 LAB — BASIC METABOLIC PANEL
BUN: 18 mg/dL (ref 6–23)
CO2: 26 mEq/L (ref 19–32)
Calcium: 9.3 mg/dL (ref 8.4–10.5)
Chloride: 108 mEq/L (ref 96–112)
Creatinine, Ser: 1.08 mg/dL (ref 0.40–1.50)
GFR: 65.12 mL/min (ref >60.00)
Glucose, Bld: 88 mg/dL (ref 70–99)
Potassium: 4.1 mEq/L (ref 3.5–5.1)
Sodium: 141 mEq/L (ref 135–145)

## 2019-06-14 LAB — VITAMIN D 25 HYDROXY (VIT D DEFICIENCY, FRACTURES): VITD: 61.91 ng/mL (ref 30.00–100.00)

## 2019-06-14 MED ORDER — ZOLPIDEM TARTRATE 10 MG PO TABS: 10.0000 mg | ORAL_TABLET | Freq: Every evening | ORAL | 1 refills | Status: DC | PRN

## 2019-06-14 NOTE — Telephone Encounter (Signed)
[facility] calling and states that the patient is wanting the zolpidem (AMBIEN) 10 MG tablet   that was sent to a local pharmacy to be sent to mail order. States that they need a new prescription sent to them within the 48 hours. Please advise B#: [phone] Ref#: [id number]

## 2019-06-14 NOTE — Telephone Encounter (Signed)
Can the Lorrin Mais be resent to mail order plrease?

## 2019-06-15 ENCOUNTER — Other Ambulatory Visit: Payer: Self-pay | Admitting: Internal Medicine

## 2019-06-16 ENCOUNTER — Other Ambulatory Visit: Payer: Self-pay | Admitting: Cardiovascular Disease

## 2019-06-20 ENCOUNTER — Telehealth: Payer: Self-pay | Admitting: Internal Medicine

## 2019-06-20 NOTE — Telephone Encounter (Signed)
Sent my chart message recommending that patient call and schedule and office visit with Dr. Henrene Pastor or Anderson Malta to discuss this and make sure he gets the correct dosage of Linzess.

## 2019-06-26 DIAGNOSIS — G5603 Carpal tunnel syndrome, bilateral upper limbs: Secondary | ICD-10-CM | POA: Diagnosis not present

## 2019-06-27 ENCOUNTER — Other Ambulatory Visit: Payer: Self-pay | Admitting: Internal Medicine

## 2019-06-27 DIAGNOSIS — F5104 Psychophysiologic insomnia: Secondary | ICD-10-CM

## 2019-06-27 MED ORDER — ZOLPIDEM TARTRATE 10 MG PO TABS: 10.0000 mg | ORAL_TABLET | Freq: Every evening | ORAL | 1 refills | Status: DC | PRN

## 2019-07-13 ENCOUNTER — Telehealth: Payer: Self-pay

## 2019-07-13 NOTE — Telephone Encounter (Signed)
Called patient back, he states he is constipated and passing very little gas. His abdomin is soft. Says the Linzess has not helped. I suggested he take 1-3 doses a day of Miralax for a couple of days until he has a soft formed stool, and then stop the Miralax. He will call us next week if no improvement

## 2019-07-16 ENCOUNTER — Telehealth: Payer: Self-pay

## 2019-07-16 NOTE — Telephone Encounter (Signed)
-----   Message from [facility] [place], [place] sent at 07/16/2019  3:24 PM EST ----- Regarding: Amitiza 8 mcg samples Can you get some samples- maybe 4-5 days worth of Amitiza 8 mcg for this patient. Can you then call him and let him know he can pick it up?  Thanks so much, Ellouise Newer, PA-C

## 2019-07-16 NOTE — Telephone Encounter (Signed)
Put Amitiza samples-44mcq dose enough for 4 days BID at the front desk. I called the patient and let him know samples are there for pick-up

## 2019-07-19 DIAGNOSIS — L821 Other seborrheic keratosis: Secondary | ICD-10-CM | POA: Diagnosis not present

## 2019-07-19 DIAGNOSIS — L570 Actinic keratosis: Secondary | ICD-10-CM | POA: Diagnosis not present

## 2019-07-19 DIAGNOSIS — Z85820 Personal history of malignant melanoma of skin: Secondary | ICD-10-CM | POA: Diagnosis not present

## 2019-07-19 DIAGNOSIS — L820 Inflamed seborrheic keratosis: Secondary | ICD-10-CM | POA: Diagnosis not present

## 2019-07-19 DIAGNOSIS — L300 Nummular dermatitis: Secondary | ICD-10-CM | POA: Diagnosis not present

## 2019-07-19 DIAGNOSIS — Z85828 Personal history of other malignant neoplasm of skin: Secondary | ICD-10-CM | POA: Diagnosis not present

## 2019-07-19 DIAGNOSIS — D044 Carcinoma in situ of skin of scalp and neck: Secondary | ICD-10-CM | POA: Diagnosis not present

## 2019-07-19 DIAGNOSIS — D0421 Carcinoma in situ of skin of right ear and external auricular canal: Secondary | ICD-10-CM | POA: Diagnosis not present

## 2019-07-19 DIAGNOSIS — L853 Xerosis cutis: Secondary | ICD-10-CM | POA: Diagnosis not present

## 2019-07-19 DIAGNOSIS — L700 Acne vulgaris: Secondary | ICD-10-CM | POA: Diagnosis not present

## 2019-07-19 DIAGNOSIS — D485 Neoplasm of uncertain behavior of skin: Secondary | ICD-10-CM | POA: Diagnosis not present

## 2019-07-19 DIAGNOSIS — D1801 Hemangioma of skin and subcutaneous tissue: Secondary | ICD-10-CM | POA: Diagnosis not present

## 2019-07-22 ENCOUNTER — Other Ambulatory Visit: Payer: Self-pay | Admitting: Cardiovascular Disease

## 2019-07-24 ENCOUNTER — Other Ambulatory Visit: Payer: Self-pay

## 2019-07-24 ENCOUNTER — Encounter: Payer: Self-pay | Admitting: Internal Medicine

## 2019-07-24 ENCOUNTER — Ambulatory Visit (INDEPENDENT_AMBULATORY_CARE_PROVIDER_SITE_OTHER): Payer: Medicare Other | Admitting: Internal Medicine

## 2019-07-24 VITALS — BP 132/70 | HR 67 | Temp 97.5°F | Ht 70.0 in | Wt 144.0 lb

## 2019-07-24 DIAGNOSIS — K5900 Constipation, unspecified: Secondary | ICD-10-CM | POA: Diagnosis not present

## 2019-07-24 NOTE — Patient Instructions (Signed)
Use Miralax daily.  Adjust to get the results you want.  Add daily Metamucil if your stools become too loose

## 2019-07-24 NOTE — Progress Notes (Signed)
HISTORY OF PRESENT ILLNESS:  [name] [name] is a 86 y.o. male with multiple medical problems as listed below who presents today for ongoing problems and management of chronic constipation.  I last saw the patient April 11, 2018 regarding chronic functional constipation.  At that time MiraLAX was introduced.  He was using Dulcolax as a rescue agent.  He subsequently saw the GI physician assistant July 14, 2018.  See that dictation for details.  MiraLAX titration was discussed.  Not clear to me how successful this was or how diligent he was with this recommendation.  He was given a trial of Linzess which he states did not work.  Contacted the office recently and was prescribed Amitiza.  The unwanted side effect of diarrhea has resulted in discontinuation of Amitiza.  Review of blood work from June 14, 2019 shows normal basic metabolic panel.  CT scan from August 2018 to evaluate abdominal pain revealed no acute abnormalities.  He has had multiple colonoscopies.  He does have a history of adenomatous colon polyps.  His last colonoscopy in 2015 revealed a diminutive adenoma which was removed and mild sigmoid diverticulosis.  Patient denies new recent medications.  No bleeding.  No unexplained weight loss.  He has been vaccinated for COVID-19.  REVIEW OF SYSTEMS:  All non-GI ROS negative unless otherwise stated in the HPI except for anxiety  Past Medical History:  Diagnosis Date  . Anxiety   . CAD (coronary artery disease)    a. s/p stent to LAD 2008;  b. abnormal ETT 11/13 with VTach => LHC pLAD 30% ISR, mCFX 30%, dRCA 60-75% which had progressed from previous study in 2010 => FFR of RCA 0.92 => med Rx   . Carotid stenosis    dopplers 1/14:  0-39% bilateral ICA stenosis  . Colon polyps   . Colon polyps    adenomatous  . Diverticular disease of colon   . Hemorrhoids    external and internal  . History of BPH   . HTN (hypertension)   . Hx of echocardiogram    a. Echo 9/11:  EF 55-60%,  normal motion, grade 2 diastolic dysfunction, mild MR, mild LAE, mild RAE, PASP 33.  Marland Kitchen Hyperlipidemia   . IBS (irritable bowel syndrome)   . Rhinitis   . Stroke, lacunar ([place]) 08/24/2012   Old, 2011 head mri    Past Surgical History:  Procedure Laterality Date  . APPENDECTOMY  1956  . CORONARY STENT PLACEMENT  2008    Successful PCI of the lesion in the proximal LAD using a Promus   . HERNIA REPAIR Right 2005  . NASAL SINUS SURGERY  1975  . PERCUTANEOUS CORONARY STENT INTERVENTION (PCI-S) N/A 05/03/2012   Procedure: PERCUTANEOUS CORONARY STENT INTERVENTION (PCI-S);  Surgeon: Sherren Mocha, MD;  Location: [facility] CATH LAB;  Service: Cardiovascular;  Laterality: N/A;  . Promus DES ok for 3T MRI  2008    Social History [name] [name]  reports that he has never smoked. He has never used smokeless tobacco. He reports previous alcohol use. He reports that he does not use drugs.  family history includes Cancer in his brother; Coronary artery disease in his father; Heart attack in his father; Rheum arthritis in his mother.  No Known Allergies     PHYSICAL EXAMINATION: Vital signs: BP 132/70   Pulse 67   Temp (!) 97.5 F (36.4 C)   Ht 5\' 10"  (1.778 m)   Wt 144 lb (65.3 kg)   BMI 20.66  kg/m   Constitutional: generally well-appearing, no acute distress Psychiatric: alert and oriented x3, cooperative Eyes: extraocular movements intact, anicteric, conjunctiva pink Mouth: oral pharynx moist, no lesions Neck: supple no lymphadenopathy Cardiovascular: heart regular rate and rhythm, no murmur Lungs: clear to auscultation bilaterally Abdomen: soft, nontender, nondistended, no obvious ascites, no peritoneal signs, normal bowel sounds, no organomegaly Rectal: Omitted Extremities: no clubbing, cyanosis, or lower extremity edema bilaterally Skin: no lesions on visible extremities Neuro: No focal deficits.  Cranial nerves intact  ASSESSMENT:  1.  Functional constipation.  Ongoing and  problematic 2.  History of adenomatous colon polyps.  Aged out of surveillance.  Last examination 2015 3.  Multiple medical problems   PLAN:  1.  Again recommended MiraLAX with titration.  We reviewed this carefully today.  He should increase the dose until he achieves the desired result.  We also discussed adding fortified fiber such as Metamucil which may give the stool but a bulk and minimize issues with diarrhea. 2.  Contact the office for any questions or problems.  Otherwise resume general medical care with primary provider Dr. Ronnald Ramp A total time of 30 minutes was spent preparing to see the patient, reviewing laboratories and x-rays, reviewing prior colonoscopy report and pathology, obtaining current history and reviewing prior history, performing comprehensive physical examination, counseling the patient regarding his above listed issue, reviewing medications and recommendations, and documenting clinical information in the health record

## 2019-08-01 DIAGNOSIS — G5602 Carpal tunnel syndrome, left upper limb: Secondary | ICD-10-CM | POA: Diagnosis not present

## 2019-08-10 NOTE — Progress Notes (Signed)
Patient ID: [name] [name], male   DOB: 10-30-1932, 86 y.o.   MRN: [id number]     86 y.o. history of CAD. Stent to LAD in 2008.  ETT in 2013 abnormal with NSVT. Cath 05/01/12 30% ISR LaD and distal RCA 60-75% with FFR CT .92 Rx medically Myovue done 04/27/17 reviewed and normal with no ischemia study not gated due to PAC;s EF normal echo 06/25/15 55-60% LA only mildly dilated   Continues to have episodes of transient visual loss.  ? Occular migraines Been going on for over 3-4 years  Seen by neurology And MRI/MRA no abnormality and EEG no epileptic foci  01/11/14  1-39% bilateral carotid disease   Daughter in [place] has a hypochondriac for husband Daughter in [place] not working as [employer] but he likes her husband better Son works with him and may open a Administrator on [place]  No chest pain   06/11/19 had some diaphoresis / diarrhea with low BP ?  Related to food or Amitiza presecribed by Dr Henrene Pastor for constipation resolved in 24 hours   Has had vaccine Walks 2 miles / day at [place] with no angina   ROS: Denies fever, malais, weight loss, blurry vision, decreased visual acuity, cough, sputum, SOB, hemoptysis, pleuritic pain, palpitaitons, heartburn, abdominal pain, melena, lower extremity edema, claudication, or rash.  All other systems reviewed and negative  General: BP (!) 110/50   Pulse 63   Ht 5\' 10"  (1.778 m)   Wt 143 lb (64.9 kg)   SpO2 98%   BMI 20.52 kg/m  Affect appropriate Healthy:  appears stated age 70: normal Neck supple with no adenopathy JVP normal no bruits no thyromegaly Lungs clear with no wheezing and good diaphragmatic motion Heart:  S1/S2 no murmur, no rub, gallop or click PMI normal Abdomen: benighn, BS positve, no tenderness, no AAA no bruit.  No HSM or HJR Distal pulses intact with no bruits No edema Neuro non-focal Skin warm and dry No muscular weakness     Current Outpatient Medications  Medication Sig Dispense Refill  . ALPRAZolam (XANAX)  0.25 MG tablet Take 1 tablet (0.25 mg total) by mouth 2 (two) times daily as needed for anxiety. 60 tablet 3  . fluticasone (FLONASE) 50 MCG/ACT nasal spray USE 2 SPRAYS IN EACH NOSTRIL DAILY 32 g 11  . ipratropium (ATROVENT) 0.03 % nasal spray USE 2 SPRAYS INTO BOTH NOSTRILS EVERY 12 HOURS 90 mL 2  . lisinopril (ZESTRIL) 10 MG tablet TAKE 1 TABLET DAILY 90 tablet 2  . metoprolol succinate (TOPROL XL) 25 MG 24 hr tablet Take 1 tablet (25 mg total) by mouth daily. 90 tablet 1  . nitroGLYCERIN (NITROSTAT) 0.4 MG SL tablet Place 1 tablet (0.4 mg total) under the tongue every 5 (five) minutes as needed for chest pain (up to 3 doses only). 25 tablet 7  . rosuvastatin (CRESTOR) 40 MG tablet TAKE 1 TABLET DAILY 90 tablet 1  . tamsulosin (FLOMAX) 0.4 MG CAPS capsule Take 0.4 mg by mouth daily.    Marland Kitchen zolpidem (AMBIEN) 10 MG tablet Take 1 tablet (10 mg total) by mouth at bedtime as needed. for sleep 90 tablet 1   No current facility-administered medications for this visit.    Allergies  Patient has no known allergies.  Electrocardiogram:   06/15/17 SR rate 77 RBBB   Assessment and Plan  CAD: Stable with no angina and good activity level.  Continue medical Rx Stent LAD 2008  Moderate distal RCA  with normal flow wire 04/2012  Non ischemic myovue 04/27/17 will repeat in December 2021   HLD : on generic crestor now  Lab Results  Component Value Date   LDLCALC 40 11/28/2018    GERD:with constipation not helped with Miralax, ducolox or Linzess f/u Dr Satira Anis:  Chronic on ambien f/u Dr Ronnald Ramp   Visual Disturbance:  MRI/MRA normal EEG normal has been seen by neurology    F/U with me in 6 months   [name]

## 2019-08-16 ENCOUNTER — Other Ambulatory Visit: Payer: Self-pay

## 2019-08-16 ENCOUNTER — Encounter: Payer: Self-pay | Admitting: Cardiovascular Disease

## 2019-08-16 ENCOUNTER — Ambulatory Visit (INDEPENDENT_AMBULATORY_CARE_PROVIDER_SITE_OTHER): Payer: Medicare Other | Admitting: Cardiovascular Disease

## 2019-08-16 VITALS — BP 110/50 | HR 63 | Ht 70.0 in | Wt 143.0 lb

## 2019-08-16 DIAGNOSIS — I2510 Atherosclerotic heart disease of native coronary artery without angina pectoris: Secondary | ICD-10-CM | POA: Diagnosis not present

## 2019-08-16 NOTE — Patient Instructions (Signed)

## 2019-08-23 ENCOUNTER — Other Ambulatory Visit: Payer: Self-pay | Admitting: Cardiovascular Disease

## 2019-08-28 ENCOUNTER — Other Ambulatory Visit: Payer: Self-pay

## 2019-08-28 ENCOUNTER — Ambulatory Visit (INDEPENDENT_AMBULATORY_CARE_PROVIDER_SITE_OTHER): Payer: Medicare Other | Admitting: Family Medicine

## 2019-08-28 ENCOUNTER — Ambulatory Visit (INDEPENDENT_AMBULATORY_CARE_PROVIDER_SITE_OTHER): Payer: Medicare Other

## 2019-08-28 ENCOUNTER — Ambulatory Visit: Payer: Self-pay

## 2019-08-28 ENCOUNTER — Encounter: Payer: Self-pay | Admitting: Family Medicine

## 2019-08-28 VITALS — BP 120/60 | HR 72 | Ht 70.0 in | Wt 143.2 lb

## 2019-08-28 DIAGNOSIS — M25552 Pain in left hip: Secondary | ICD-10-CM

## 2019-08-28 DIAGNOSIS — M25512 Pain in left shoulder: Secondary | ICD-10-CM | POA: Diagnosis not present

## 2019-08-28 DIAGNOSIS — I2510 Atherosclerotic heart disease of native coronary artery without angina pectoris: Secondary | ICD-10-CM | POA: Diagnosis not present

## 2019-08-28 DIAGNOSIS — G8929 Other chronic pain: Secondary | ICD-10-CM

## 2019-08-28 NOTE — Progress Notes (Signed)
I, [name] [name], LAT, ATC, am serving as scribe for Dr. [name].  [name] [name] is a 86 y.o. male who presents to [place] [facility] today for L shoulder and B hip pain.  He was last seen by Dr. Tamala Julian on 03/21/19 for B hip pain and had a L GT injection.  Since his last visit, pt reports he is having L shoulder and L hip pain.    L shoulder: Pt reports L shoulder pain x 3 weeks w/ no known MOI.  He notes pain when trying to pull up his pants and w/ L sidelying.  He denies any L shoulder mechanical symptoms.  Of note, he had L carpal tunnel surgery approximately one month ago so is unsure if this would be related to his L shoulder pain.  Hip pain:  Pt notes his L hip pain flared up about 4-5 weeks ago.  He denies any L LE radiating pain or l hip mechanical symptoms.  He has no pain w/ walking.  Diagnostic imaging: L-spine XR- 01/04/19   Pertinent review of systems: No fevers or chills  Relevant historical information: History CVA   Exam:  BP 120/60 (BP Location: Left Arm, Patient Position: Sitting, Cuff Size: Normal)   Pulse 72   Ht 5\' 10"  (1.778 m)   Wt 143 lb 3.2 oz (65 kg)   SpO2 96%   BMI 20.55 kg/m  General: Well Developed, well nourished, and in no acute distress.   MSK:  Left shoulder normal-appearing Nontender. Full motion abduction external/internal rotation. Intact strength abduction external and internal rotation. Negative empty can test. Negative Hawkins and Neer's test. Negative Yergason's and speeds test. Pulses cap refill and sensation intact distally.  Left hip normal.  Normal motion.  Tender palpation greater trochanter.  Hip abduction strength reduced.    Lab and Radiology Results  X-ray images left shoulder obtained today personally and independently reviewed Mild DJD glenohumeral joint and AC joint.  No fractures otherwise. Await formal radiology review  Diagnostic Limited MSK Ultrasound of: Left shoulder Biceps tendon  intact normal-appearing Subscapularis tendon intact normal-appearing Supraspinatus tendon intact.  Hypoechoic fluid collecting superficial to supraspinatus tendon consistent with subacromial bursitis. Infraspinatus tendon intact.  Hyperechoic calcification present at distal portion of tendon consistent with chronic calcific tendinopathy. AC joint degenerative with mild effusion Impression: Subacromial bursitis mild rotator cuff tendinopathy  Procedure: Real-time Ultrasound Guided Injection of left greater trochanter bursa Device: Philips Affiniti 50G Images permanently stored and available for review in the ultrasound unit. Verbal informed consent obtained.  Discussed risks and benefits of procedure. Warned about infection bleeding damage to structures skin hypopigmentation and fat atrophy among others. Patient expresses understanding and agreement Time-out conducted.   Noted no overlying erythema, induration, or other signs of local infection.   Skin prepped in a sterile fashion.   Local anesthesia: Topical Ethyl chloride.   With sterile technique and under real time ultrasound guidance:  40 mg of Kenalog and 3 mL of Marcaine injected easily.   Completed without difficulty   Pain immediately resolved suggesting accurate placement of the medication.   Advised to call if fevers/chills, erythema, induration, drainage, or persistent bleeding.   Images permanently stored and available for review in the ultrasound unit.  Impression: Technically successful ultrasound guided injection.       Assessment and Plan: 86 y.o. male with  Left lateral hip pain.  Recurrent trochanteric bursitis.  Patient had excellent response to injection a year ago.  Plan for repeat injection today.  Additionally recommend home exercise program for strengthening.  Recheck back as needed.  Left shoulder pain: A bit more puzzling.  Pain only ongoing now for a few weeks.  Patient's pain is more diffuse and likely  component of periscapular dysfunction.  He also has some evidence of bursitis and tendinopathy on ultrasound examination but his physical exam is not significant for this.  Plan for home exercise program especially focusing on scapular stabilization.  Recommend physical therapy if not better.  Otherwise recheck back in a month if not improved.  [id number]; 15 additional minutes spent for Therapeutic exercises as stated in above notes.  This included exercises focusing on stretching, strengthening, with significant focus on eccentric aspects.   Long term goals include an improvement in range of motion, strength, endurance as well as avoiding reinjury. Patient's frequency would include in 1-2 times a day, 3-5 times a week for a duration of 6-12 weeks.  Proper technique shown and discussed handout in great detail with ATC.  All questions were discussed and answered.     Orders Placed This Encounter  Procedures  . [place] LIMITED JOINT SPACE STRUCTURES LOW LEFT(NO LINKED CHARGES)    Order Specific Question:   Reason for Exam (SYMPTOM  OR DIAGNOSIS REQUIRED)    Answer:   L hip pain    Order Specific Question:   Preferred imaging location?    Answer:   [place]  . DG Shoulder Left    Standing Status:   Future    Standing Expiration Date:   10/27/2020    Order Specific Question:   Reason for Exam (SYMPTOM  OR DIAGNOSIS REQUIRED)    Answer:   eval shoulder pain left    Order Specific Question:   Preferred imaging location?    Answer:   [facility]    Order Specific Question:   Radiology Contrast Protocol - do NOT remove file path    Answer:   [email or web address]   No orders of the defined types were placed in this encounter.    Discussed warning signs or symptoms. Please see discharge instructions. Patient expresses understanding.   The above documentation has been reviewed and is accurate and complete [name]

## 2019-08-28 NOTE — Patient Instructions (Addendum)
Thank you for coming in today.  Try the voltaren gel up to 4x daily for pain.  Work on shoulder and hip exercises.  Recheck with myself or Dr Tamala Julian as needed.  Consider PT.  Let me know if you would like me to order it.   If not better in about 1 month injection may help.   Get xray on your way out.   Please perform the exercise program that we have prepared for you and gone over in detail on a daily basis.  In addition to the handout you were provided you can access your program through: [email or web address]   Your unique program code is: [id number] and  [facility]

## 2019-08-29 ENCOUNTER — Ambulatory Visit (INDEPENDENT_AMBULATORY_CARE_PROVIDER_SITE_OTHER): Payer: Medicare Other | Admitting: Internal Medicine

## 2019-08-29 ENCOUNTER — Encounter: Payer: Self-pay | Admitting: Internal Medicine

## 2019-08-29 VITALS — Temp 98.1°F | Ht 70.0 in | Wt 143.0 lb

## 2019-08-29 DIAGNOSIS — I2510 Atherosclerotic heart disease of native coronary artery without angina pectoris: Secondary | ICD-10-CM

## 2019-08-29 DIAGNOSIS — K5900 Constipation, unspecified: Secondary | ICD-10-CM

## 2019-08-29 NOTE — Patient Instructions (Signed)
Please follow up as needed 

## 2019-08-29 NOTE — Progress Notes (Signed)
HISTORY OF PRESENT ILLNESS:  [name] [name] is a 86 y.o. male with multiple medical problems who schedules his appointment for himself regarding ongoing problems with the management of chronic constipation.  He was last seen in the office July 24, 2019 regarding the same.  See that dictation for details.  He has tried Linzess which was ineffective.  Amitiza which resulted in diarrhea.  At the time of the last visit I recommended regular doses of MiraLAX and fiber.  He did that for a while.  Stop MiraLAX after having had some loose urgent stools but is continued with Metamucil.  Patient tells me that Dulcolax works.  He tells me that he has bowel movements 5 times per week.  He is concerned that not having a bowel movement daily is a problem.  He denies any abdominal complaints on days that he does not have a bowel movement.  He also worries about "lazy bowel" and chronic laxative use.  His last complete colonoscopy in 2015 revealed diminutive colon polyp and mild diverticulosis.  No new problems.  REVIEW OF SYSTEMS:  All non-GI ROS negative unless stated in the HPI except for allergies  Past Medical History:  Diagnosis Date  . Anxiety   . CAD (coronary artery disease)    a. s/p stent to LAD 2008;  b. abnormal ETT 11/13 with VTach => LHC pLAD 30% ISR, mCFX 30%, dRCA 60-75% which had progressed from previous study in 2010 => FFR of RCA 0.92 => med Rx   . Carotid stenosis    dopplers 1/14:  0-39% bilateral ICA stenosis  . Colon polyps   . Colon polyps    adenomatous  . Diverticular disease of colon   . Hemorrhoids    external and internal  . History of BPH   . HTN (hypertension)   . Hx of echocardiogram    a. Echo 9/11:  EF 55-60%, normal motion, grade 2 diastolic dysfunction, mild MR, mild LAE, mild RAE, PASP 33.  Marland Kitchen Hyperlipidemia   . IBS (irritable bowel syndrome)   . Rhinitis   . Stroke, lacunar ([place]) 08/24/2012   Old, 2011 head mri    Past Surgical History:  Procedure Laterality Date   . APPENDECTOMY  1956  . CORONARY STENT PLACEMENT  2008    Successful PCI of the lesion in the proximal LAD using a Promus   . HERNIA REPAIR Right 2005  . NASAL SINUS SURGERY  1975  . PERCUTANEOUS CORONARY STENT INTERVENTION (PCI-S) N/A 05/03/2012   Procedure: PERCUTANEOUS CORONARY STENT INTERVENTION (PCI-S);  Surgeon: Sherren Mocha, MD;  Location: [facility] CATH LAB;  Service: Cardiovascular;  Laterality: N/A;  . Promus DES ok for 3T MRI  2008    Social History BON REASON  reports that he has never smoked. He has never used smokeless tobacco. He reports previous alcohol use. He reports that he does not use drugs.  family history includes Cancer in his brother; Coronary artery disease in his father; Heart attack in his father; Rheum arthritis in his mother.  No Known Allergies     PHYSICAL EXAMINATION: Vital signs: Temp 98.1 F (36.7 C)   Ht 5\' 10"  (1.778 m)   Wt 143 lb (64.9 kg)   BMI 20.52 kg/m   Constitutional: generally well-appearing, no acute distress Psychiatric: alert and oriented x3, cooperative Eyes: Anicteric Mouth: Mask Abdomen: Not reexamined  ASSESSMENT:  1.  Chronic constipation.  The patient has minimal complaints associated with this.  Rather, he is concerned about  the notion that having a bowel movement daily is normal.  He is also concerned about chronic laxative use, though these worked nicely on demand. 2.  History of diminutive adenoma 2015.  Aged out of surveillance 3.  Multiple medical problems 4.  Health related anxiety   PLAN:  1.  I discussed with him that having bowel movements 5 times per week with no other associated symptoms may be perfectly normal.  I also discussed with him that he could use laxative of choice, in proper doses, that works for him, without particular concerns.  He was relieved.  We also addressed multiple excellent questions to his satisfaction A total time of 20 minutes was spent preparing to see the patient, reviewing  previous test, obtaining history, performing limited but medically appropriate exam, counseling the patient regarding his constipation, reinforcing the proper way to take on-demand therapies, and documenting information in his clinical record.

## 2019-08-29 NOTE — Progress Notes (Signed)
X-ray left shoulder shows mild arthritis.  Additionally decreased bone mineral density is seen.  No acute fractures.

## 2019-09-17 ENCOUNTER — Ambulatory Visit (INDEPENDENT_AMBULATORY_CARE_PROVIDER_SITE_OTHER): Payer: Medicare Other

## 2019-09-17 ENCOUNTER — Other Ambulatory Visit: Payer: Self-pay

## 2019-09-17 VITALS — BP 120/70 | HR 78 | Temp 98.2°F | Resp 16 | Ht 70.0 in | Wt 137.6 lb

## 2019-09-17 DIAGNOSIS — Z0000 Encounter for general adult medical examination without abnormal findings: Secondary | ICD-10-CM

## 2019-09-17 NOTE — Progress Notes (Addendum)
Subjective:   [name] [name] is a 86 y.o. male who presents for Medicare Annual/Subsequent preventive examination.  Review of Systems:  No ROS Medicare Wellness Visit Cardiac Risk Factors include: advanced age (>60men, >70 women);family history of premature cardiovascular disease;male gender Sleep Patterns: No issues with falling asleep; feels rested on waking after 8 hours of aleep; gets up 1-2 times to void. Home Safety/Smoke Alarms: Feels safe in home; Smoke alarms in place. Living environment: Lives with his wife in a 2-story home; no need for DME; good support system; 2 daughters, 1 son and grandchildren. Seat Belt Safety/Bike Helmet: Wears seat belt.    Objective:    Vitals: BP 120/70 (BP Location: Left Arm, Patient Position: Sitting, Cuff Size: Normal)   Pulse 78   Temp 98.2 F (36.8 C)   Resp 16   Ht 5\' 10"  (1.778 m)   Wt 137 lb 9.6 oz (62.4 kg)   SpO2 98%   BMI 19.74 kg/m   Body mass index is 19.74 kg/m.  Advanced Directives 09/17/2019 09/13/2018 08/03/2017 07/25/2016 07/10/2015 05/03/2012  Does Patient Have a Medical Advance Directive? Yes Yes Yes Yes Yes Patient does not have advance directive  Type of Advance Directive [place];Living will [place];Living will [place];Living will [place];Living will [place];Living will -  Does patient want to make changes to medical advance directive? No - Patient declined - - - No - Patient declined -  Copy of [place] in Chart? No - copy requested No - copy requested No - copy requested Yes Yes -  Pre-existing out of facility DNR order (yellow form or pink MOST form) - - - - - No    Tobacco Social History   Tobacco Use  Smoking Status Never Smoker  Smokeless Tobacco Never Used     Counseling given: No   Clinical Intake:  Pre-visit preparation completed: Yes  Pain : No/denies pain     BMI -  recorded: 19.7 Nutritional Status: BMI of 19-24  Normal Nutritional Risks: None Diabetes: No  How often do you need to have someone help you when you read instructions, pamphlets, or other written materials from your doctor or pharmacy?: 1 - Never What is the last grade level you completed in school?: Graduate School; Barrister's clerk)  Interpreter Needed?: No  Information entered by :: [employer]. [employer], LPN  Past Medical History:  Diagnosis Date  . Anxiety   . CAD (coronary artery disease)    a. s/p stent to LAD 2008;  b. abnormal ETT 11/13 with VTach => LHC pLAD 30% ISR, mCFX 30%, dRCA 60-75% which had progressed from previous study in 2010 => FFR of RCA 0.92 => med Rx   . Carotid stenosis    dopplers 1/14:  0-39% bilateral ICA stenosis  . Colon polyps   . Colon polyps    adenomatous  . Diverticular disease of colon   . Hemorrhoids    external and internal  . History of BPH   . HTN (hypertension)   . Hx of echocardiogram    a. Echo 9/11:  EF 55-60%, normal motion, grade 2 diastolic dysfunction, mild MR, mild LAE, mild RAE, PASP 33.  Marland Kitchen Hyperlipidemia   . IBS (irritable bowel syndrome)   . Rhinitis   . Stroke, lacunar ([place]) 08/24/2012   Old, 2011 head mri   Past Surgical History:  Procedure Laterality Date  . APPENDECTOMY  [place]  2008    Successful PCI of the lesion in the proximal LAD using a Promus   . HERNIA REPAIR Right 2005  . NASAL SINUS SURGERY  1975  . PERCUTANEOUS CORONARY STENT INTERVENTION (PCI-S) N/A 05/03/2012   Procedure: PERCUTANEOUS CORONARY STENT INTERVENTION (PCI-S);  Surgeon: Sherren Mocha, MD;  Location: [facility] CATH LAB;  Service: Cardiovascular;  Laterality: N/A;  . Promus DES ok for 3T MRI  2008   Family History  Problem Relation Age of Onset  . Coronary artery disease Father   . Heart attack Father   . Rheum arthritis Mother   . Cancer Brother        ? TYPE  . Colon cancer Neg Hx   . Stomach cancer Neg Hx    . Pancreatic cancer Neg Hx    Social History   Socioeconomic History  . Marital status: Married    Spouse name: Not on file  . Number of children: 3  . Years of education: 49  . Highest education level: Not on file  Occupational History  . Occupation: businessman, 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RETIRED  Tobacco Use  . Smoking status: Never Smoker  . Smokeless tobacco: Never Used  Substance and Sexual Activity  . Alcohol use: Not Currently    Comment: 4-6 drinks/week  . Drug use: No  . Sexual activity: Yes    Partners: Female  Other Topics Concern  . 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 He has two daughters, 1 in [place] 1 in [place] (Dec '12) and a son who works with him. Several grandchildren.Reports that he spends a good deal of time at his home in [facility] which he greatly enjoys and his home in the [place]. Enjoys driving his BMW [place] when [place].      ACP - Yes CPR, yes for short-term mechanical ventilation for reversible disease. Recommended [email or web address] for consideration.   Social Determinants of Health   Financial Resource Strain:   . Difficulty of Paying Living Expenses:   Food Insecurity:   . Worried About Charity fundraiser in the Last Year:   . Arboriculturist in the Last Year:   Transportation Needs:   . Film/video editor (Medical):   Marland Kitchen Lack of Transportation (Non-Medical):   Physical Activity:   . Days of Exercise per Week:   . Minutes of Exercise per Session:   Stress:   . Feeling of Stress :   Social Connections:   . Frequency of Communication with Friends and Family:   . Frequency of Social Gatherings with Friends and Family:   . Attends Religious Services:   . Active Member of Clubs or Organizations:   . Attends Archivist Meetings:   Marland Kitchen Marital Status:      Outpatient Encounter Medications as of 09/17/2019  Medication Sig  . ALPRAZolam (XANAX) 0.25 MG tablet Take 1 tablet (0.25 mg total) by mouth 2 (two) times daily as needed for anxiety.  . fluticasone (FLONASE) 50 MCG/ACT nasal spray USE 2 SPRAYS IN EACH NOSTRIL DAILY  . ipratropium (ATROVENT) 0.03 % nasal spray USE 2 SPRAYS INTO BOTH NOSTRILS EVERY 12 HOURS  . lisinopril (ZESTRIL) 10 MG tablet TAKE 1 TABLET DAILY  . metoprolol succinate (TOPROL-XL) 25 MG 24 hr tablet TAKE 1 TABLET BY  MOUTH DAILY  . nitroGLYCERIN (NITROSTAT) 0.4 MG SL tablet Place 1 tablet (0.4 mg total) under the tongue every 5 (five) minutes as needed for chest pain (up to 3 doses only).  . psyllium (METAMUCIL) 58.6 % packet Take 1 packet by mouth daily.  . rosuvastatin (CRESTOR) 40 MG tablet TAKE 1 TABLET DAILY  . tamsulosin (FLOMAX) 0.4 MG CAPS capsule Take 0.4 mg by mouth daily.  Marland Kitchen zolpidem (AMBIEN) 10 MG tablet Take 1 tablet (10 mg total) by mouth at bedtime as needed. for sleep   No facility-administered encounter medications on file as of 09/17/2019.    Activities of Daily Living In your present state of health, do you have any difficulty performing the following activities: 09/17/2019  Hearing? Y  Comment deaf in left ear  Vision? N  Difficulty concentrating or making decisions? N  Walking or climbing stairs? Y  Dressing or bathing? Y  Doing errands, shopping? Y  Preparing Food and eating ? N  Using the Toilet? N  In the past six months, have you accidently leaked urine? N  Do you have problems with loss of bowel control? N  Managing your Medications? N  Managing your Finances? N  Housekeeping or managing your Housekeeping? N  Some recent data might be hidden    Patient Care Team: [name], MD as PCP - General (Internal Medicine) Josue Hector, MD as PCP - Cardiology (Cardiology) Bjorn Loser, MD as Consulting Physician (Urology)   Assessment:   This is a routine wellness examination for  [facility].  Exercise Activities and Dietary recommendations Current Exercise Habits: Home exercise routine, Type of exercise: walking, Time (Minutes): 25, Frequency (Times/Week): 7, Weekly Exercise (Minutes/Week): 175, Intensity: Moderate, Exercise limited by: None identified  Goals    . Patient Stated     I want to maintain my current health status by continuing to be physically and socially active and by eating healthy.       Fall Risk Fall Risk  09/17/2019 09/13/2018 08/05/2017 08/03/2017 07/25/2016  Falls in the past year? 0 1 No No No  Number falls in past yr: 0 0 - - -  Injury with Fall? 0 0 - - -  Risk for fall due to : No Fall Risks - - - -  Follow up Falls evaluation completed;Education provided Falls prevention discussed - - -   Is the patient's home free of loose throw rugs in walkways, pet beds, electrical cords, etc?   yes      Grab bars in the bathroom? no      Handrails on the stairs?   yes      Adequate lighting?   yes   Depression Screen PHQ 2/9 Scores 09/17/2019 09/13/2018 08/05/2017 08/03/2017  PHQ - 2 Score 0 0 0 0  PHQ- 9 Score - - - 0    Cognitive Function MMSE - Mini Mental State Exam 08/03/2017  Orientation to time 5  Orientation to Place 5  Registration 3  Attention/ Calculation 5  Recall 3  Language- name 2 objects 2  Language- repeat 1  Language- follow 3 step command 3  Language- read & follow direction 1  Write a sentence 1  Copy design 1  Total score 30     6CIT Screen 09/17/2019  What Year? 0 points  What month? 0 points  What time? 0 points  Count back from 20 0 points  Months in reverse 0 points  Repeat phrase 0 points  Total Score 0  Immunization History  Administered Date(s) Administered  . Fluad Quad(high Dose 65+) 02/02/2019  . H1N1 04/24/2008  . Influenza Whole 02/27/2008, 04/28/2009, 03/01/2011  . Influenza, High Dose Seasonal PF 02/06/2013, 02/21/2014, 02/17/2015, 02/10/2017, 03/09/2018  . Influenza,inj,Quad PF,6+ Mos 02/16/2016   . Influenza-Unspecified 01/16/2012  . PFIZER SARS-COV-2 Vaccination 05/25/2019, 06/15/2019  . Pneumococcal Conjugate-13 06/26/2013, 02/21/2014  . Pneumococcal Polysaccharide-23 02/27/2008, 05/22/2019  . Pneumococcal-Unspecified 02/06/2013  . Tdap 12/15/2011  . Zoster 04/28/2009, 05/10/2011, 02/17/2015  . Zoster Recombinat (Shingrix) 01/24/2018, 05/22/2018    Qualifies for Shingles Vaccine? completed  Screening Tests Health Maintenance  Topic Date Due  . INFLUENZA VACCINE  12/16/2019  . TETANUS/TDAP  12/14/2021  . COVID-19 Vaccine  Completed  . PNA vac Low Risk Adult  Completed   Cancer Screenings: Lung: Low Dose CT Chest recommended if Age 75-80 years, 30 pack-year currently smoking OR have quit w/in 15years. Patient does not qualify. Colorectal: Yes     Plan:     Reviewed health maintenance screenings with patient today and relevant education, vaccines, and/or referrals were provided.    Continue doing brain stimulating activities (puzzles, reading, adult coloring books, staying active) to keep memory sharp.    Continue to eat heart healthy diet (full of fruits, vegetables, whole grains, lean protein, water--limit salt, fat, and sugar intake) and increase physical activity as tolerated.  I have personally reviewed and noted the following in the patient's chart:   . Medical and social history . Use of alcohol, tobacco or illicit drugs  . Current medications and supplements . Functional ability and status . Nutritional status . Physical activity . Advanced directives . List of other physicians . Hospitalizations, surgeries, and ER visits in previous 12 months . Vitals . Screenings to include cognitive, depression, and falls . Referrals and appointments  In addition, I have reviewed and discussed with patient certain preventive protocols, quality metrics, and best practice recommendations. A written personalized care plan for preventive services as well as general  preventive health recommendations were provided to patient.    Medical screening examination/treatment/procedure(s) were performed by non-physician practitioner and as supervising physician I was immediately available for consultation/collaboration. I agree with above. Scarlette Calico, MD    Sheral Flow, LPN  [id number] Nurse Health Advisor  Nurse Notes:  After Visit Summary printed for patient.

## 2019-09-17 NOTE — Patient Instructions (Addendum)
[name] , Thank you for taking time to come for your Medicare Wellness Visit. I appreciate your ongoing commitment to your health goals. Please review the following plan we discussed and let me know if I can assist you in the future.   Screening recommendations/referrals: Colorectal Screening: not recommended due to age; last done on 08/27/2013  Vision and Dental Exams: Recommended annual ophthalmology exams for early detection of glaucoma and other disorders of the eye Recommended annual dental exams for proper oral hygiene  Vaccinations: Influenza vaccine: 01/16/2019 Pneumococcal vaccine: completed; Prevnar 02/21/2014, Pneumovax 05/22/2019 Tdap vaccine: 12/15/2011; due every 10 years Shingles vaccine: completed; Shingrix 01/24/2018, 05/22/2018 Covid vaccine: completed; [place] 05/25/2019, 06/15/2019  Advanced directives: Advance directives discussed with you today. Please bring a copy of your POA (Power of [place]) and/or Living Will to your next appointment.  Goals:  Recommend to drink at least 6-8 8oz glasses of water per day.  Recommend to exercise for at least 150 minutes per week.  Recommend to remove any items from the home that may cause slips or trips.  Recommend to decrease portion sizes by eating 3 small healthy meals and at least 2 healthy snacks per day.  Recommend to begin DASH diet as directed below  Recommend to continue efforts to reduce smoking habits until no longer smoking. Smoking Cessation literature is attached below.  Next appointment: Please schedule your Annual Wellness Visit with your Nurse Health Advisor in one year.  Preventive Care 65 Years and Older, Male Preventive care refers to lifestyle choices and visits with your health care provider that can promote health and wellness. What does preventive care include?  A yearly physical exam. This is also called an annual well check.  Dental exams once or twice a year.  Routine eye exams. Ask your health care  provider how often you should have your eyes checked.  Personal lifestyle choices, including:  Daily care of your teeth and gums.  Regular physical activity.  Eating a healthy diet.  Avoiding tobacco and drug use.  Limiting alcohol use.  Practicing safe sex.  Taking low doses of aspirin every day if recommended by your health care provider..  Taking vitamin and mineral supplements as recommended by your health care provider. What happens during an annual well check? The services and screenings done by your health care provider during your annual well check will depend on your age, overall health, lifestyle risk factors, and family history of disease. Counseling  Your health care provider may ask you questions about your:  Alcohol use.  Tobacco use.  Drug use.  Emotional well-being.  Home and relationship well-being.  Sexual activity.  Eating habits.  History of falls.  Memory and ability to understand (cognition).  Work and work Statistician. Screening  You may have the following tests or measurements:  Height, weight, and BMI.  Blood pressure.  Lipid and cholesterol levels. These may be checked every 5 years, or more frequently if you are over 84 years old.  Skin check.  Lung cancer screening. You may have this screening every year starting at age 37 if you have a 30-pack-year history of smoking and currently smoke or have quit within the past 15 years.  Fecal occult blood test (FOBT) of the stool. You may have this test every year starting at age 47.  Flexible sigmoidoscopy or colonoscopy. You may have a sigmoidoscopy every 5 years or a colonoscopy every 10 years starting at age 75.  Prostate cancer screening. Recommendations will vary depending  on your family history and other risks.  Hepatitis C blood test.  Hepatitis B blood test.  Sexually transmitted disease (STD) testing.  Diabetes screening. This is done by checking your blood sugar  (glucose) after you have not eaten for a while (fasting). You may have this done every 1-3 years.  Abdominal aortic aneurysm (AAA) screening. You may need this if you are a current or former smoker.  Osteoporosis. You may be screened starting at age 24 if you are at high risk. Talk with your health care provider about your test results, treatment options, and if necessary, the need for more tests. Vaccines  Your health care provider may recommend certain vaccines, such as:  Influenza vaccine. This is recommended every year.  Tetanus, diphtheria, and acellular pertussis (Tdap, Td) vaccine. You may need a Td booster every 10 years.  Zoster vaccine. You may need this after age 2.  Pneumococcal 13-valent conjugate (PCV13) vaccine. One dose is recommended after age 35.  Pneumococcal polysaccharide (PPSV23) vaccine. One dose is recommended after age 69. Talk to your health care provider about which screenings and vaccines you need and how often you need them. This information is not intended to replace advice given to you by your health care provider. Make sure you discuss any questions you have with your health care provider. Document Released: 05/30/2015 Document Revised: 01/21/2016 Document Reviewed: 03/04/2015 Elsevier Interactive Patient Education  2017 [place] Prevention in the Home Falls can cause injuries. They can happen to people of all ages. There are many things you can do to make your home safe and to help prevent falls. What can I do on the outside of my home?  Regularly fix the edges of walkways and driveways and fix any cracks.  Remove anything that might make you trip as you walk through a door, such as a raised step or threshold.  Trim any bushes or trees on the path to your home.  Use bright outdoor lighting.  Clear any walking paths of anything that might make someone trip, such as rocks or tools.  Regularly check to see if handrails are loose or  broken. Make sure that both sides of any steps have handrails.  Any raised decks and porches should have guardrails on the edges.  Have any leaves, snow, or ice cleared regularly.  Use sand or salt on walking paths during winter.  Clean up any spills in your garage right away. This includes oil or grease spills. What can I do in the bathroom?  Use night lights.  Install grab bars by the toilet and in the tub and shower. Do not use towel bars as grab bars.  Use non-skid mats or decals in the tub or shower.  If you need to sit down in the shower, use a plastic, non-slip stool.  Keep the floor dry. Clean up any water that spills on the floor as soon as it happens.  Remove soap buildup in the tub or shower regularly.  Attach bath mats securely with double-sided non-slip rug tape.  Do not have throw rugs and other things on the floor that can make you trip. What can I do in the bedroom?  Use night lights.  Make sure that you have a light by your bed that is easy to reach.  Do not use any sheets or blankets that are too big for your bed. They should not hang down onto the floor.  Have a firm chair that has side arms.  You can use this for support while you get dressed.  Do not have throw rugs and other things on the floor that can make you trip. What can I do in the kitchen?  Clean up any spills right away.  Avoid walking on wet floors.  Keep items that you use a lot in easy-to-reach places.  If you need to reach something above you, use a strong step stool that has a grab bar.  Keep electrical cords out of the way.  Do not use floor polish or wax that makes floors slippery. If you must use wax, use non-skid floor wax.  Do not have throw rugs and other things on the floor that can make you trip. What can I do with my stairs?  Do not leave any items on the stairs.  Make sure that there are handrails on both sides of the stairs and use them. Fix handrails that are  broken or loose. Make sure that handrails are as long as the stairways.  Check any carpeting to make sure that it is firmly attached to the stairs. Fix any carpet that is loose or worn.  Avoid having throw rugs at the top or bottom of the stairs. If you do have throw rugs, attach them to the floor with carpet tape.  Make sure that you have a light switch at the top of the stairs and the bottom of the stairs. If you do not have them, ask someone to add them for you. What else can I do to help prevent falls?  Wear shoes that:  Do not have high heels.  Have rubber bottoms.  Are comfortable and fit you well.  Are closed at the toe. Do not wear sandals.  If you use a stepladder:  Make sure that it is fully opened. Do not climb a closed stepladder.  Make sure that both sides of the stepladder are locked into place.  Ask someone to hold it for you, if possible.  Clearly mark and make sure that you can see:  Any grab bars or handrails.  First and last steps.  Where the edge of each step is.  Use tools that help you move around (mobility aids) if they are needed. These include:  Canes.  Walkers.  Scooters.  Crutches.  Turn on the lights when you go into a dark area. Replace any light bulbs as soon as they burn out.  Set up your furniture so you have a clear path. Avoid moving your furniture around.  If any of your floors are uneven, fix them.  If there are any pets around you, be aware of where they are.  Review your medicines with your doctor. Some medicines can make you feel dizzy. This can increase your chance of falling. Ask your doctor what other things that you can do to help prevent falls. This information is not intended to replace advice given to you by your health care provider. Make sure you discuss any questions you have with your health care provider. Document Released: 02/27/2009 Document Revised: 10/09/2015 Document Reviewed: 06/07/2014 Elsevier  Interactive Patient Education  2017 [employer].

## 2019-09-19 DIAGNOSIS — Z85828 Personal history of other malignant neoplasm of skin: Secondary | ICD-10-CM | POA: Diagnosis not present

## 2019-09-19 DIAGNOSIS — D485 Neoplasm of uncertain behavior of skin: Secondary | ICD-10-CM | POA: Diagnosis not present

## 2019-09-19 DIAGNOSIS — Z85820 Personal history of malignant melanoma of skin: Secondary | ICD-10-CM | POA: Diagnosis not present

## 2019-09-19 DIAGNOSIS — L570 Actinic keratosis: Secondary | ICD-10-CM | POA: Diagnosis not present

## 2019-09-19 DIAGNOSIS — C44729 Squamous cell carcinoma of skin of left lower limb, including hip: Secondary | ICD-10-CM | POA: Diagnosis not present

## 2019-10-18 ENCOUNTER — Other Ambulatory Visit: Payer: Self-pay

## 2019-10-18 ENCOUNTER — Encounter: Payer: Self-pay | Admitting: Internal Medicine

## 2019-10-18 ENCOUNTER — Ambulatory Visit (INDEPENDENT_AMBULATORY_CARE_PROVIDER_SITE_OTHER): Payer: Medicare Other | Admitting: Internal Medicine

## 2019-10-18 VITALS — BP 140/76 | HR 62 | Temp 98.0°F | Resp 16 | Ht 70.0 in | Wt 139.5 lb

## 2019-10-18 DIAGNOSIS — E785 Hyperlipidemia, unspecified: Secondary | ICD-10-CM | POA: Diagnosis not present

## 2019-10-18 DIAGNOSIS — Z0000 Encounter for general adult medical examination without abnormal findings: Secondary | ICD-10-CM

## 2019-10-18 DIAGNOSIS — E559 Vitamin D deficiency, unspecified: Secondary | ICD-10-CM

## 2019-10-18 DIAGNOSIS — I2510 Atherosclerotic heart disease of native coronary artery without angina pectoris: Secondary | ICD-10-CM | POA: Diagnosis not present

## 2019-10-18 DIAGNOSIS — Z91038 Other insect allergy status: Secondary | ICD-10-CM | POA: Diagnosis not present

## 2019-10-18 DIAGNOSIS — I10 Essential (primary) hypertension: Secondary | ICD-10-CM

## 2019-10-18 LAB — HEPATIC FUNCTION PANEL
ALT: 21 U/L (ref 0–53)
AST: 22 U/L (ref 0–37)
Albumin: 4 g/dL (ref 3.5–5.2)
Alkaline Phosphatase: 44 U/L (ref 39–117)
Bilirubin, Direct: 0.1 mg/dL (ref 0.0–0.3)
Total Bilirubin: 0.4 mg/dL (ref 0.2–1.2)
Total Protein: 6.2 g/dL (ref 6.0–8.3)

## 2019-10-18 LAB — BASIC METABOLIC PANEL
BUN: 19 mg/dL (ref 6–23)
CO2: 28 mEq/L (ref 19–32)
Calcium: 9.2 mg/dL (ref 8.4–10.5)
Chloride: 106 mEq/L (ref 96–112)
Creatinine, Ser: 1 mg/dL (ref 0.40–1.50)
GFR: 71.11 mL/min (ref >60.00)
Glucose, Bld: 89 mg/dL (ref 70–99)
Potassium: 4.3 mEq/L (ref 3.5–5.1)
Sodium: 138 mEq/L (ref 135–145)

## 2019-10-18 LAB — CBC WITH DIFFERENTIAL/PLATELET
Basophils Absolute: 0 10*3/uL (ref 0.0–0.1)
Basophils Relative: 0.5 % (ref 0.0–3.0)
Eosinophils Absolute: 0.2 10*3/uL (ref 0.0–0.7)
Eosinophils Relative: 3.1 % (ref 0.0–5.0)
HCT: 39.1 % (ref 39.0–52.0)
Hemoglobin: 13.4 g/dL (ref 13.0–17.0)
Lymphocytes Relative: 26.2 % (ref 12.0–46.0)
Lymphs Abs: 1.4 10*3/uL (ref 0.7–4.0)
MCHC: 34.2 g/dL (ref 30.0–36.0)
MCV: 91.8 fl (ref 78.0–100.0)
Monocytes Absolute: 0.6 10*3/uL (ref 0.1–1.0)
Monocytes Relative: 10.7 % (ref 3.0–12.0)
Neutro Abs: 3.1 10*3/uL (ref 1.4–7.7)
Neutrophils Relative %: 59.5 % (ref 43.0–77.0)
Platelets: 189 10*3/uL (ref 150.0–400.0)
RBC: 4.26 Mil/uL (ref 4.22–5.81)
RDW: 13.9 % (ref 11.5–15.5)
WBC: 5.2 10*3/uL (ref 4.0–10.5)

## 2019-10-18 LAB — LIPID PANEL
Cholesterol: 134 mg/dL (ref 0–200)
HDL: 66.1 mg/dL
LDL Cholesterol: 56 mg/dL (ref 0–99)
NonHDL: 68.38
Total CHOL/HDL Ratio: 2
Triglycerides: 60 mg/dL (ref 0.0–149.0)
VLDL: 12 mg/dL (ref 0.0–40.0)

## 2019-10-18 LAB — VITAMIN D 25 HYDROXY (VIT D DEFICIENCY, FRACTURES): VITD: 54.44 ng/mL (ref 30.00–100.00)

## 2019-10-18 LAB — TSH: TSH: 2.57 u[IU]/mL (ref 0.35–4.50)

## 2019-10-18 MED ORDER — FLUOCINONIDE 0.05 % EX OINT: 1.0000 "application " | TOPICAL_OINTMENT | Freq: Two times a day (BID) | CUTANEOUS | 1 refills | Status: DC

## 2019-10-18 NOTE — Progress Notes (Signed)
Subjective:  Patient ID: [name] [name], male    DOB: 07/10/1932  Age: 86 y.o. MRN: [id number]  CC: Hypertension and Hyperlipidemia  This visit occurred during the SARS-CoV-2 public health emergency.  Safety protocols were in place, including screening questions prior to the visit, additional usage of staff PPE, and extensive cleaning of exam room while observing appropriate contact time as indicated for disinfecting solutions.    HPI [name] [name] presents for a CPX.  1. Tick bite LLQ abd about 3 weeks ago , there area remains red/itchy/swollen, he has not gotten much relief with OTC hydrocortisone cream.  2. BP has been well controlled.  He is active and denies any recent palpitations, chest pain, shortness of breath, edema, or fatigue.  Outpatient Medications Prior to Visit  Medication Sig Dispense Refill  . ALPRAZolam (XANAX) 0.25 MG tablet Take 1 tablet (0.25 mg total) by mouth 2 (two) times daily as needed for anxiety. 60 tablet 3  . Cholecalciferol (VITAMIN D3 PO) Take 2,000 Units/day by mouth.    . dutasteride (AVODART) 0.5 MG capsule Take 0.5 mg by mouth daily.    . fluticasone (FLONASE) 50 MCG/ACT nasal spray USE 2 SPRAYS IN EACH NOSTRIL DAILY 32 g 11  . ipratropium (ATROVENT) 0.03 % nasal spray USE 2 SPRAYS INTO BOTH NOSTRILS EVERY 12 HOURS 90 mL 2  . lisinopril (ZESTRIL) 10 MG tablet TAKE 1 TABLET DAILY 90 tablet 2  . metoprolol succinate (TOPROL-XL) 25 MG 24 hr tablet TAKE 1 TABLET BY MOUTH DAILY 90 tablet 3  . nitroGLYCERIN (NITROSTAT) 0.4 MG SL tablet Place 1 tablet (0.4 mg total) under the tongue every 5 (five) minutes as needed for chest pain (up to 3 doses only). 25 tablet 7  . Polyethylene Glycol 3350 (MIRALAX PO) Take by mouth.    . psyllium (METAMUCIL) 58.6 % packet Take 1 packet by mouth daily.    . rosuvastatin (CRESTOR) 40 MG tablet TAKE 1 TABLET DAILY 90 tablet 1  . tamsulosin (FLOMAX) 0.4 MG CAPS capsule Take 0.4 mg by mouth daily.    Marland Kitchen zolpidem (AMBIEN)  10 MG tablet Take 1 tablet (10 mg total) by mouth at bedtime as needed. for sleep 90 tablet 1   No facility-administered medications prior to visit.    ROS Review of Systems  Constitutional: Negative.  Negative for appetite change, diaphoresis, fatigue and unexpected weight change.  HENT: Negative.   Eyes: Negative.   Respiratory: Negative for cough, chest tightness, shortness of breath and wheezing.   Cardiovascular: Negative for chest pain, palpitations and leg swelling.  Gastrointestinal: Negative for abdominal pain, constipation, diarrhea, nausea and vomiting.  Endocrine: Negative.   Genitourinary: Negative.  Negative for difficulty urinating.  Musculoskeletal: Negative for arthralgias and myalgias.  Skin: Positive for color change and rash.  Neurological: Negative.  Negative for dizziness, weakness, light-headedness and headaches.  Hematological: Negative for adenopathy. Does not bruise/bleed easily.  Psychiatric/Behavioral: Negative.     Objective:  BP 140/76 (BP Location: Left Arm, Patient Position: Sitting, Cuff Size: Large)   Pulse 62   Temp 98 F (36.7 C) (Oral)   Resp 16   Ht 5\' 10"  (1.778 m)   Wt 139 lb 8 oz (63.3 kg)   SpO2 95%   BMI 20.02 kg/m   BP Readings from Last 3 Encounters:  10/18/19 140/76  09/17/19 120/70  08/28/19 120/60    Wt Readings from Last 3 Encounters:  10/18/19 139 lb 8 oz (63.3 kg)  09/17/19 137  lb 9.6 oz (62.4 kg)  08/29/19 143 lb (64.9 kg)    Physical Exam Vitals reviewed.  Constitutional:      Appearance: Normal appearance.  HENT:     Nose: Nose normal.     Mouth/Throat:     Mouth: Mucous membranes are moist.  Eyes:     General: No scleral icterus.    Conjunctiva/sclera: Conjunctivae normal.  Cardiovascular:     Rate and Rhythm: Normal rate. Rhythm irregularly irregular.     Heart sounds: No murmur. No gallop.      Comments: EKG - Sinus rhythm with sinus arrhythmia, 73 bpm Incomplete RBBB Otherwise normal  EKG Pulmonary:     Effort: Pulmonary effort is normal.     Breath sounds: No stridor. No wheezing, rhonchi or rales.  Abdominal:     General: Abdomen is flat.     Palpations: There is no mass.     Tenderness: There is no abdominal tenderness. There is no guarding.    Musculoskeletal:        General: Normal range of motion.     Cervical back: Neck supple.     Right lower leg: No edema.     Left lower leg: No edema.  Lymphadenopathy:     Cervical: No cervical adenopathy.  Skin:    General: Skin is warm and dry.     Coloration: Skin is not pale.  Neurological:     General: No focal deficit present.     Mental Status: He is alert.  Psychiatric:        Mood and Affect: Mood normal.        Behavior: Behavior normal.     Lab Results  Component Value Date   WBC 5.2 10/18/2019   HGB 13.4 10/18/2019   HCT 39.1 10/18/2019   PLT 189.0 10/18/2019   GLUCOSE 89 10/18/2019   CHOL 134 10/18/2019   TRIG 60.0 10/18/2019   HDL 66.10 10/18/2019   LDLDIRECT 132.2 03/10/2007   LDLCALC 56 10/18/2019   ALT 21 10/18/2019   AST 22 10/18/2019   NA 138 10/18/2019   K 4.3 10/18/2019   CL 106 10/18/2019   CREATININE 1.00 10/18/2019   BUN 19 10/18/2019   CO2 28 10/18/2019   TSH 2.57 10/18/2019   PSA 0.44 05/10/2011   INR 1.0 04/27/2012    DG Lumbar Spine Complete  Result Date: 01/04/2019 CLINICAL DATA:  Low back pain. EXAM: LUMBAR SPINE - COMPLETE 4+ VIEW COMPARISON:  CT abdomen pelvis-12/30/2016 FINDINGS: There are 5 non rib-bearing lumbar type vertebral bodies. There is a mild S-shaped scoliotic curvature of the thoracolumbar spine with dominant caudal component convex the right measuring approximately 10 degrees (as measured from the superior endplate of L1 to the inferior endplate of L4). There is minimal (approximately 3 4 mm) of anterolisthesis of L2 upon L3, L3 upon L4 and L4 upon L5. No anterolisthesis. No definite pars defects. Mild (approximately 25%) compression deformities  involving the T12 and L1 vertebral bodies are unchanged since the abdominal CT performed 12/2016. Remaining lumbar vertebral body heights are preserved. Mild to moderate multilevel lumbar spine DDD, worse at L5-S1 with disc space height loss, endplate irregularity and sclerosis. Note is made of a prominent syndesmophyte about the left lateral aspect of the L2-L3 intervertebral disc space, similar to remote abdominal CT. Atherosclerotic plaque within the abdominal aorta. Vascular calcifications overlie the left upper abdomen and lower pelvis bilaterally. Moderate to large colonic stool burden without evidence of enteric obstruction. IMPRESSION: 1.  No acute findings. 2. Mild-to-moderate multilevel lumbar spine DDD, worse at L5-S1, likely progressed compared to remote abdominal CT performed 12/2016. Electronically Signed   By: Sandi Mariscal M.D.   On: 01/04/2019 14:29   [place] LIMITED JOINT SPACE STRUCTURES LOW RIGHT  Result Date: 01/20/2019 Procedure: Real-time Ultrasound Guided Injection of right greater trochanteric bursitis secondary to patient's body habitus Device: GE Logiq Q7 Ultrasound guided injection is preferred based studies that show increased duration, increased effect, greater accuracy, decreased procedural pain, increased response rate, and decreased cost with ultrasound guided versus blind injection. Verbal informed consent obtained. Time-out conducted. Noted no overlying erythema, induration, or other signs of local infection. Skin prepped in a sterile fashion. Local anesthesia: Topical Ethyl chloride. With sterile technique and under real time ultrasound guidance: Greater trochanteric area was visualized and patient's bursa was noted. A 22-gauge 3 inch needle was inserted and 4 cc of 0.5% Marcaine and 1 cc of Kenalog 40 mg/dL was injected. Pictures taken Completed without difficulty Pain immediately resolved suggesting accurate placement of the medication. Advised to call if fevers/chills, erythema,  induration, drainage, or persistent bleeding. Images permanently stored and available for review in the ultrasound unit. Impression: Technically successful ultrasound guided injection. Procedure: Real-time Ultrasound Guided Injection of left greater trochanteric bursitis secondary to patient's body habitus Device: GE Logiq Q7 Ultrasound guided injection is preferred based studies that show increased duration, increased effect, greater accuracy, decreased procedural pain, increased response rate, and decreased cost with ultrasound guided versus blind injection. Verbal informed consent obtained. Time-out conducted. Noted no overlying erythema, induration, or other signs of local infection. Skin prepped in a sterile fashion. Local anesthesia: Topical Ethyl chloride. With sterile technique and under real time ultrasound guidance: Greater trochanteric area was visualized and patient's bursa was noted. A 22-gauge 3 inch needle was inserted and 4 cc of 0.5% Marcaine and 1 cc of Kenalog 40 mg/dL was injected. Pictures taken Completed without difficulty Pain immediately resolved suggesting accurate placement of the medication. Advised to call if fevers/chills, erythema, induration, drainage, or persistent bleeding. Images permanently stored and available for review in the ultrasound unit. Impression: Technically successful ultrasound guided injection.    Assessment & Plan:   Patricia was seen today for hypertension and hyperlipidemia.  Diagnoses and all orders for this visit:  Essential hypertension- His blood pressure is well controlled.  Electrolytes and renal function are normal. -     Basic metabolic panel; Future -     CBC with Differential/Platelet; Future -     TSH; Future -     EKG 12-Lead -     TSH -     CBC with Differential/Platelet -     Basic metabolic panel  Hyperlipidemia with target LDL less than 70- He has achieved his LDL goal and is doing well on the statin. -     Lipid panel; Future -      TSH; Future -     Hepatic function panel; Future -     Hepatic function panel -     TSH -     Lipid panel  Routine health maintenance- Exam completed, labs reviewed, vaccines reviewed and updated, no cancer screenings are indicated, patient education was given.  Vitamin D deficiency- His vitamin D level is normal now. -     VITAMIN D 25 Hydroxy (Vit-D Deficiency, Fractures); Future -     VITAMIN D 25 Hydroxy (Vit-D Deficiency, Fractures)  Allergic reaction to insect bite- He is having a localized allergic reaction.  I recommended that he treat this with a potent topical steroid ointment. -     fluocinonide ointment (LIDEX) 0.05 %; Apply 1 application topically 2 (two) times daily.   I am having [name] Lower "Cooper" start on fluocinonide ointment. I am also having him maintain his fluticasone, ipratropium, nitroGLYCERIN, ALPRAZolam, lisinopril, zolpidem, rosuvastatin, tamsulosin, metoprolol succinate, psyllium, dutasteride, Polyethylene Glycol 3350 (MIRALAX PO), and Cholecalciferol (VITAMIN D3 PO).  Meds ordered this encounter  Medications  . fluocinonide ointment (LIDEX) 0.05 %    Sig: Apply 1 application topically 2 (two) times daily.    Dispense:  60 g    Refill:  1     Follow-up: Return in about 3 weeks (around 11/08/2019).  Scarlette Calico, MD

## 2019-10-18 NOTE — Patient Instructions (Signed)

## 2019-10-19 ENCOUNTER — Encounter: Payer: Self-pay | Admitting: Internal Medicine

## 2019-10-23 DIAGNOSIS — S30861A Insect bite (nonvenomous) of abdominal wall, initial encounter: Secondary | ICD-10-CM | POA: Diagnosis not present

## 2019-10-23 DIAGNOSIS — Z85820 Personal history of malignant melanoma of skin: Secondary | ICD-10-CM | POA: Diagnosis not present

## 2019-10-23 DIAGNOSIS — Z85828 Personal history of other malignant neoplasm of skin: Secondary | ICD-10-CM | POA: Diagnosis not present

## 2019-11-16 ENCOUNTER — Ambulatory Visit: Payer: Self-pay

## 2019-11-16 ENCOUNTER — Ambulatory Visit (INDEPENDENT_AMBULATORY_CARE_PROVIDER_SITE_OTHER): Payer: Medicare Other | Admitting: Family Medicine

## 2019-11-16 ENCOUNTER — Other Ambulatory Visit: Payer: Self-pay

## 2019-11-16 ENCOUNTER — Encounter: Payer: Self-pay | Admitting: Family Medicine

## 2019-11-16 VITALS — BP 132/70 | HR 86 | Ht 70.0 in | Wt 141.0 lb

## 2019-11-16 DIAGNOSIS — M7062 Trochanteric bursitis, left hip: Secondary | ICD-10-CM | POA: Diagnosis not present

## 2019-11-16 DIAGNOSIS — M25552 Pain in left hip: Secondary | ICD-10-CM | POA: Diagnosis not present

## 2019-11-16 DIAGNOSIS — G8929 Other chronic pain: Secondary | ICD-10-CM

## 2019-11-16 DIAGNOSIS — I2510 Atherosclerotic heart disease of native coronary artery without angina pectoris: Secondary | ICD-10-CM | POA: Diagnosis not present

## 2019-11-16 NOTE — Patient Instructions (Addendum)
You had a L hip injection today. Call or go to the ER if you develop a large red swollen joint with extreme pain or oozing puss.   Plan for PT.  Recheck as needed.

## 2019-11-16 NOTE — Progress Notes (Signed)
   I, [name], LAT, ATC, am serving as scribe for Dr. [name].  [name] [name] is a 86 y.o. male who presents to [place] [facility] today for f/u of L hip pain.  He was last seen by Dr. Georgina Snell on 08/28/19 for his L hip and L shoulder and had a L GT injection.  He was also provided a HEP for his hip (hip aBd and glute bridge) and shoulder (RC and peri-scap strengthening).  Since his last visit, pt reports the hip is not doing better. Would like another injection. Patient states the shoulder is stable.   Pertinent review of systems: No fevers or chills  Relevant historical information: Hypertension, CAD   Exam:  BP 132/70 (BP Location: Left Arm, Patient Position: Sitting)   Pulse 86   Ht 5\' 10"  (1.778 m)   Wt 141 lb (64 kg)   SpO2 95%   BMI 20.23 kg/m  General: Well Developed, well nourished, and in no acute distress.   MSK: Left hip normal-appearing tender palpation greater trochanter.  Reduced hip abduction strength.    Lab and Radiology Results  Procedure: Real-time Ultrasound Guided Injection of left greater trochanter bursa Device: Philips Affiniti 50G Images permanently stored and available for review in the ultrasound unit. Verbal informed consent obtained.  Discussed risks and benefits of procedure. Warned about infection bleeding damage to structures skin hypopigmentation and fat atrophy among others. Patient expresses understanding and agreement Time-out conducted.   Noted no overlying erythema, induration, or other signs of local infection.   Skin prepped in a sterile fashion.   Local anesthesia: Topical Ethyl chloride.   With sterile technique and under real time ultrasound guidance:  40 mg of Kenalog and 2 mL of Marcaine injected easily.   Completed without difficulty   Pain immediately resolved suggesting accurate placement of the medication.   Advised to call if fevers/chills, erythema, induration, drainage, or persistent bleeding.    Images permanently stored and available for review in the ultrasound unit.  Impression: Technically successful ultrasound guided injection.         Assessment and Plan: 86 y.o. male with left lateral hip pain due to trochanteric bursitis.  Plan for repeat injection today.  At this point doubtful that injections alone are going to be enough to control pain.  Stressed the importance of physical therapy, exercise program.  Physical therapy referral ordered today.  Recheck back as needed if not improved.   PDMP not reviewed this encounter. Orders Placed This Encounter  Procedures  . [place] LIMITED JOINT SPACE STRUCTURES LOW LEFT(NO LINKED CHARGES)    Order Specific Question:   Reason for Exam (SYMPTOM  OR DIAGNOSIS REQUIRED)    Answer:   L hip pain    Order Specific Question:   Preferred imaging location?    Answer:   [facility]  . Ambulatory referral to Physical Therapy    Referral Priority:   Routine    Referral Type:   Physical Medicine    Referral Reason:   Specialty Services Required    Requested Specialty:   Physical Therapy   No orders of the defined types were placed in this encounter.    Discussed warning signs or symptoms. Please see discharge instructions. Patient expresses understanding.   The above documentation has been reviewed and is accurate and complete [name], M.D.

## 2019-11-22 DIAGNOSIS — Z85820 Personal history of malignant melanoma of skin: Secondary | ICD-10-CM | POA: Diagnosis not present

## 2019-11-22 DIAGNOSIS — C44729 Squamous cell carcinoma of skin of left lower limb, including hip: Secondary | ICD-10-CM | POA: Diagnosis not present

## 2019-11-22 DIAGNOSIS — Z85828 Personal history of other malignant neoplasm of skin: Secondary | ICD-10-CM | POA: Diagnosis not present

## 2019-11-26 ENCOUNTER — Other Ambulatory Visit: Payer: Self-pay

## 2019-11-26 ENCOUNTER — Ambulatory Visit (INDEPENDENT_AMBULATORY_CARE_PROVIDER_SITE_OTHER): Payer: Medicare Other | Admitting: Rehabilitative and Restorative Service Providers"

## 2019-11-26 ENCOUNTER — Encounter: Payer: Self-pay | Admitting: Rehabilitative and Restorative Service Providers"

## 2019-11-26 DIAGNOSIS — R262 Difficulty in walking, not elsewhere classified: Secondary | ICD-10-CM

## 2019-11-26 DIAGNOSIS — M25552 Pain in left hip: Secondary | ICD-10-CM | POA: Diagnosis not present

## 2019-11-26 DIAGNOSIS — M6281 Muscle weakness (generalized): Secondary | ICD-10-CM | POA: Diagnosis not present

## 2019-11-26 NOTE — Therapy (Signed)
[facility] [address] [place], [place], [address] Phone: [phone]   Fax:  [phone]  Physical Therapy Evaluation  Patient Details  Name: [name] [name] MRN: [id number] Date of Birth: 02/13/1933 Referring Provider (PT): Dr. [name]   Encounter Date: 11/26/2019   PT End of Session - 11/26/19 1504    Visit Number 1    Number of Visits 16    Date for PT Re-Evaluation 01/21/20    Authorization Type Medicare    Authorization Time Period 01/21/2020 POC    Progress Note Due on Visit 10    PT Start Time 1515    PT Stop Time 1548    PT Time Calculation (min) 33 min    Activity Tolerance Patient tolerated treatment well    Behavior During Therapy [facility] for tasks assessed/performed           Past Medical History:  Diagnosis Date  . Anxiety   . CAD (coronary artery disease)    a. s/p stent to LAD 2008;  b. abnormal ETT 11/13 with VTach => LHC pLAD 30% ISR, mCFX 30%, dRCA 60-75% which had progressed from previous study in 2010 => FFR of RCA 0.92 => med Rx   . Carotid stenosis    dopplers 1/14:  0-39% bilateral ICA stenosis  . Colon polyps   . Colon polyps    adenomatous  . Diverticular disease of colon   . Hemorrhoids    external and internal  . History of BPH   . HTN (hypertension)   . Hx of echocardiogram    a. Echo 9/11:  EF 55-60%, normal motion, grade 2 diastolic dysfunction, mild MR, mild LAE, mild RAE, PASP 33.  Marland Kitchen Hyperlipidemia   . IBS (irritable bowel syndrome)   . Rhinitis   . Stroke, lacunar ([place]) 08/24/2012   Old, 2011 head mri    Past Surgical History:  Procedure Laterality Date  . APPENDECTOMY  1956  . CORONARY STENT PLACEMENT  2008    Successful PCI of the lesion in the proximal LAD using a Promus   . HERNIA REPAIR Right 2005  . NASAL SINUS SURGERY  1975  . PERCUTANEOUS CORONARY STENT INTERVENTION (PCI-S) N/A 05/03/2012   Procedure: PERCUTANEOUS CORONARY STENT INTERVENTION (PCI-S);  Surgeon: Sherren Mocha, MD;   Location: [facility] CATH LAB;  Service: Cardiovascular;  Laterality: N/A;  . Promus DES ok for 3T MRI  2008    There were no vitals filed for this visit.    Subjective Assessment - 11/26/19 1521    Subjective Pt. indicated injection in hip about 2 weeks ago.   Pt. stated chief complaint is pain in lateral hip while sleeping.  Pt. indicated having new onset of symptoms with walking incline c pain up to 4/10.    Currently in Pain? Yes    Pain Score 2    at worst 4/10   Pain Location Hip    Pain Orientation Left    Pain Descriptors / Indicators Tender    Pain Type Chronic pain    Pain Onset More than a month ago    Pain Frequency Occasional    Aggravating Factors  sleeping on side    Pain Relieving Factors injection    Effect of Pain on Daily Activities Sleeping on side    Multiple Pain Sites No              [facility] PT Assessment - 11/26/19 0001      Assessment   Medical Diagnosis  Lt hip pain/bursitis    Referring Provider (PT) Dr. [name]    Onset Date/Surgical Date 08/16/19      Precautions   Precautions None      Restrictions   Weight Bearing Restrictions No      Balance Screen   Has the patient fallen in the past 6 months No    Is the patient reluctant to leave their home because of a fear of falling?  No      Home Ecologist residence    Home Layout Able to live on main level with bedroom/bathroom      Prior Function   Level of Independence Independent    Vocation Retired    Office manager of building stores    Leisure walking for exercise      Cognition   Overall Cognitive Status Within Functional Limits for tasks assessed      Functional Tests   Functional tests Single leg stance      Single Leg Stance   Comments Rt SLS 6 seconds, Lt SLS < 3 seconds c mild pain noted      Posture/Postural Control   Posture Comments Unremarkable for pelvic and LE alignment      ROM / Strength   AROM / PROM / Strength  PROM;Strength;AROM      AROM   Overall AROM Comments Unremarkable lumbar screen for symptoms    AROM Assessment Site Lumbar;Hip    Right/Left Hip Left;Right      PROM   PROM Assessment Site Hip    Right/Left Hip Left;Right    Right Hip External Rotation  40    Right Hip Internal Rotation  25    Left Hip Flexion WFL    Left Hip External Rotation  30    Left Hip Internal Rotation  15      Strength   Strength Assessment Site Hip;Knee;Ankle    Right/Left Hip Left;Right    Right Hip Flexion 5/5    Right Hip ABduction 4-/5    Left Hip Flexion 5/5    Left Hip ABduction 3+/5    Right/Left Knee Left;Right    Right Knee Flexion 5/5    Right Knee Extension 5/5    Left Knee Flexion 5/5    Left Knee Extension 5/5    Right/Left Ankle Left;Right    Right Ankle Dorsiflexion 5/5    Left Ankle Dorsiflexion 5/5      Palpation   Palpation comment TrP and concordant pain from Lt glute med      Transfers   Comments 18 inch chair transfer s UE assist, s pain                      Objective measurements completed on examination: See above findings.       [facility] PT Treatment/Exercise - 11/26/19 0001      Self-Care   Self-Care --      Exercises   Exercises Other Exercises    Other Exercises  HEP instruction/performance c additional time on cues/handout:  HEP c clam shells, supine bridge, sk to opposite shoulder stretch, SLS      Manual Therapy   Manual therapy comments Active compression c movement to Lt glute med                  PT Education - 11/26/19 1504    Education Details HEP, POC    Person(s) Educated Patient  Methods Explanation;Demonstration;Handout    Comprehension Verbalized understanding;Returned demonstration            PT Short Term Goals - 11/26/19 1514      PT SHORT TERM GOAL #1   Title Patient will demonstrate independent use of home exercise program to maintain progress from in clinic treatments.    Time 2    Period Weeks      Status New    Target Date 12/10/19             PT Long Term Goals - 11/26/19 1513      PT LONG TERM GOAL #1   Title Patient will demonstrate/report pain at worst less than or equal to 2/10 to facilitate minimal limitation in daily activity secondary to pain symptoms.    Status New    Target Date 01/21/20      PT LONG TERM GOAL #2   Title Patient will demonstrate independent use of home exercise program to facilitate ability to maintain/progress functional gains from skilled physical therapy services.    Status New    Target Date 01/21/20      PT LONG TERM GOAL #3   Title Pt. will demonstrate bilateral SLS >15 seconds to facilitate reduced fall risk.    Status New    Target Date 01/21/20      PT LONG TERM GOAL #4   Title Pt. will demonstrate bilateral hip abd MMT > or = 4+/5 to facilitate improved stability in ambulation.    Status New    Target Date 01/21/20      PT LONG TERM GOAL #5   Title Pt. will demonstrate Lt hip er/ir equal to Rt to facilitate usual daily activity at PLOF.    Status New    Target Date 01/21/20                  Plan - 11/26/19 1508    Clinical Impression Statement Patient is a 86 y.o. male who comes to clinic with complaints of Lt hip pain with mobility, strength and movement coordination deficits that impair his ability to perform usual daily and recreational functional activities without increase difficulty/symptoms at this time.  Patient to benefit from skilled PT services to address impairments and limitations to improve to previous level of function without restriction secondary to condition.    Personal Factors and Comorbidities Comorbidity 3+    Comorbidities CAD, HTN, hyperlipidemia, IBS    Stability/Clinical Decision Making Stable/Uncomplicated    Clinical Decision Making Low    Rehab Potential Good    PT Frequency --   2-3x/week   PT Duration 8 weeks    PT Treatment/Interventions ADLs/Self Care Home Management;Electrical  Stimulation;Iontophoresis 4mg /ml Dexamethasone;Moist Heat;Traction;Balance training;Therapeutic exercise;Therapeutic activities;Functional mobility training;Stair training;Ultrasound;Neuromuscular re-education;Patient/family education;Manual techniques;Taping;Dry needling;Passive range of motion;Spinal Manipulations;Joint Manipulations    PT Next Visit Plan Review HEP, manual trigger point release, possible DN?    PT Home Exercise Plan [email or web address]    Consulted and Agree with Plan of Care Patient           Patient will benefit from skilled therapeutic intervention in order to improve the following deficits and impairments:  Decreased coordination, Decreased range of motion, Difficulty walking, Pain, Impaired perceived functional ability, Decreased activity tolerance, Impaired flexibility, Increased muscle spasms, Decreased mobility, Decreased balance, Decreased strength, Hypomobility, Decreased endurance  Visit Diagnosis: Pain in left hip  Muscle weakness (generalized)  Difficulty in walking, not elsewhere classified     Problem List Patient Active Problem List  Diagnosis Date Noted  . Allergic reaction to insect bite 10/18/2019  . GAD (generalized anxiety disorder) 05/22/2019  . Greater trochanteric bursitis of both hips 01/11/2018  . Chronic idiopathic constipation 08/03/2017  . Vitamin D deficiency 08/03/2017  . Exocrine pancreatic insufficiency 12/27/2016  . Cervical radiculitis 07/06/2016  . Acromioclavicular joint arthritis 11/10/2015  . Fear of flying 07/09/2015  . Allergic rhinitis 07/02/2014  . Bilateral carotid bruits 12/12/2013  . Stroke, lacunar ([place]) 08/24/2012  . OAB (overactive bladder) 05/26/2012  . NSVT (nonsustained ventricular tachycardia) ([place]) 05/22/2012  . Routine health maintenance 05/11/2011  . Hyperlipidemia with target LDL less than 70 06/17/2008  . Coronary atherosclerosis 04/26/2007  . Essential hypertension 02/06/2007    Scot Jun, PT, DPT,  OCS, ATC 11/26/19  3:56 PM    [facility] [address] [place], [place], [address] Phone: [phone]   Fax:  [phone]  Name: [name] [name] MRN: [id number] Date of Birth: November 19, 1932

## 2019-11-26 NOTE — Patient Instructions (Signed)
Access Code: [email or web address] URL: [email or web address] Date: 11/26/2019 Prepared by: Scot Jun  Exercises Supine Bridge - 2 x daily - 7 x weekly - 10 reps - 3 sets - 2 hold Clamshell - 2 x daily - 7 x weekly - 3 sets - 10 reps Supine Piriformis Stretch with Foot on Ground - 2 x daily - 7 x weekly - 5 sets - 15 reps Single Leg Stance - 2 x daily - 7 x weekly - 3 sets - 10 reps - 15-30 hold

## 2019-12-04 DIAGNOSIS — Z85828 Personal history of other malignant neoplasm of skin: Secondary | ICD-10-CM | POA: Diagnosis not present

## 2019-12-04 DIAGNOSIS — Z85820 Personal history of malignant melanoma of skin: Secondary | ICD-10-CM | POA: Diagnosis not present

## 2019-12-04 DIAGNOSIS — C44729 Squamous cell carcinoma of skin of left lower limb, including hip: Secondary | ICD-10-CM | POA: Diagnosis not present

## 2019-12-18 DIAGNOSIS — L570 Actinic keratosis: Secondary | ICD-10-CM | POA: Diagnosis not present

## 2020-01-04 ENCOUNTER — Ambulatory Visit: Payer: Self-pay

## 2020-01-04 ENCOUNTER — Ambulatory Visit (INDEPENDENT_AMBULATORY_CARE_PROVIDER_SITE_OTHER): Payer: Medicare Other | Admitting: Family Medicine

## 2020-01-04 ENCOUNTER — Other Ambulatory Visit: Payer: Self-pay

## 2020-01-04 ENCOUNTER — Encounter: Payer: Self-pay | Admitting: Family Medicine

## 2020-01-04 VITALS — BP 130/84 | HR 55 | Ht 70.0 in | Wt 141.0 lb

## 2020-01-04 DIAGNOSIS — M7061 Trochanteric bursitis, right hip: Secondary | ICD-10-CM | POA: Diagnosis not present

## 2020-01-04 DIAGNOSIS — M25551 Pain in right hip: Secondary | ICD-10-CM

## 2020-01-04 DIAGNOSIS — M7062 Trochanteric bursitis, left hip: Secondary | ICD-10-CM

## 2020-01-04 DIAGNOSIS — I2510 Atherosclerotic heart disease of native coronary artery without angina pectoris: Secondary | ICD-10-CM | POA: Diagnosis not present

## 2020-01-04 NOTE — Patient Instructions (Signed)
Injected both hips today Write me in 2 weeks if not better will get MRI See me when you need me

## 2020-01-04 NOTE — Assessment & Plan Note (Signed)
Patient has done remarkably well for some time but the left side did have a flare given injection by another provider 6 weeks ago but we did decide to do bilateral.  We have discussed with patient previously that the degenerative disc in the lumbar spine could be potentially contributing as well and we will consider the possibility of further intervention.  Patient will increase activity slowly and follow-up with me again in 3 months if necessary

## 2020-01-04 NOTE — Progress Notes (Signed)
[facility] [place] [place] Phone: [phone] Subjective:   [name] [name], am serving as a scribe for Dr. Hulan Saas. This visit occurred during the SARS-CoV-2 public health emergency.  Safety protocols were in place, including screening questions prior to the visit, additional usage of staff PPE, and extensive cleaning of exam room while observing appropriate contact time as indicated for disinfecting solutions.   I'm seeing this patient by the request  of:  [name], MD  CC: Bilateral hip pain  IOM:[id number]   03/21/2019 Patient given injection today and tolerated the procedure well.  Discussed which activities to do which wants to avoid.  Patient will increase activity as tolerated.  Follow-up again in 4 to 8 weeks  Update 01/04/2020 [name] [name] is a 86 y.o. male coming in with complaint of right hip pain. Patient has seen Dr. Georgina Snell for visits since last November. Left GT injection on 11/16/2019. Patient states that both hips with walking down hill.  Patient states that starting to wake him up at night.  Saw another provider and had a left-sided injection.  Feels like it did not make significant improvement.  Patient continues to have the pain on a regular basis.    Past Medical History:  Diagnosis Date  . Anxiety   . CAD (coronary artery disease)    a. s/p stent to LAD 2008;  b. abnormal ETT 11/13 with VTach => LHC pLAD 30% ISR, mCFX 30%, dRCA 60-75% which had progressed from previous study in 2010 => FFR of RCA 0.92 => med Rx   . Carotid stenosis    dopplers 1/14:  0-39% bilateral ICA stenosis  . Colon polyps   . Colon polyps    adenomatous  . Diverticular disease of colon   . Hemorrhoids    external and internal  . History of BPH   . HTN (hypertension)   . Hx of echocardiogram    a. Echo 9/11:  EF 55-60%, normal motion, grade 2 diastolic dysfunction, mild MR, mild LAE, mild RAE, PASP 33.  Marland Kitchen Hyperlipidemia     . IBS (irritable bowel syndrome)   . Rhinitis   . Stroke, lacunar ([place]) 08/24/2012   Old, 2011 head mri   Past Surgical History:  Procedure Laterality Date  . APPENDECTOMY  1956  . CORONARY STENT PLACEMENT  2008    Successful PCI of the lesion in the proximal LAD using a Promus   . HERNIA REPAIR Right 2005  . NASAL SINUS SURGERY  1975  . PERCUTANEOUS CORONARY STENT INTERVENTION (PCI-S) N/A 05/03/2012   Procedure: PERCUTANEOUS CORONARY STENT INTERVENTION (PCI-S);  Surgeon: Sherren Mocha, MD;  Location: [facility] CATH LAB;  Service: Cardiovascular;  Laterality: N/A;  . Promus DES ok for 3T MRI  2008   Social History   Socioeconomic History  . Marital status: Married    Spouse name: Not on file  . Number of children: 3  . Years of education: 10  . Highest education level: Not on file  Occupational History  . Occupation: businessman, 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RETIRED  Tobacco Use  . Smoking status: Never Smoker  . Smokeless tobacco: Never Used  Vaping Use  . Vaping Use: Never used  Substance and Sexual Activity  . Alcohol use: Not Currently    Comment: 4-6 drinks/week  . Drug use: [name]  . Sexual activity: Yes    Partners: Female  Other Topics Concern  . 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 He has two daughters, 1 in [place] 1 in [place] (Dec '12) and a son who works with him. Several grandchildren.Reports that he spends a good deal of time at his home in [facility] [place] [facility] which he greatly enjoys and his home in the [place]. Enjoys driving his BMW [place] when [place].      ACP - Yes CPR, yes for short-term mechanical ventilation for reversible disease. Recommended [email or web address] for consideration.   Social Determinants of Health   Financial Resource Strain:   . Difficulty of Paying Living Expenses: Not on file   Food Insecurity:   . Worried About Charity fundraiser in the Last Year: Not on file  . Ran Out of Food in the Last Year: Not on file  Transportation Needs:   . Lack of Transportation (Medical): Not on file  . Lack of Transportation (Non-Medical): Not on file  Physical Activity:   . Days of Exercise per Week: Not on file  . Minutes of Exercise per Session: Not on file  Stress:   . Feeling of Stress : Not on file  Social Connections:   . Frequency of Communication with Friends and Family: Not on file  . Frequency of Social Gatherings with Friends and Family: Not on file  . Attends Religious Services: Not on file  . Active Member of Clubs or Organizations: Not on file  . Attends Archivist Meetings: Not on file  . Marital Status: Not on file   [name] Known Allergies Family History  Problem Relation Age of Onset  . Coronary artery disease Father   . Heart attack Father   . Rheum arthritis Mother   . Cancer Brother        ? TYPE  . Colon cancer Neg Hx   . Stomach cancer Neg Hx   . Pancreatic cancer Neg Hx      Current Outpatient Medications (Cardiovascular):  .  lisinopril (ZESTRIL) 10 MG tablet, TAKE 1 TABLET DAILY .  metoprolol succinate (TOPROL-XL) 25 MG 24 hr tablet, TAKE 1 TABLET BY MOUTH DAILY .  nitroGLYCERIN (NITROSTAT) 0.4 MG SL tablet, Place 1 tablet (0.4 mg total) under the tongue every 5 (five) minutes as needed for chest pain (up to 3 doses only). .  rosuvastatin (CRESTOR) 40 MG tablet, TAKE 1 TABLET DAILY  Current Outpatient Medications (Respiratory):  .  fluticasone (FLONASE) 50 MCG/ACT nasal spray, USE 2 SPRAYS IN EACH NOSTRIL DAILY .  ipratropium (ATROVENT) 0.03 % nasal spray, USE 2 SPRAYS INTO BOTH NOSTRILS EVERY 12 HOURS    Current Outpatient Medications (Other):  Marland Kitchen  ALPRAZolam (XANAX) 0.25 MG tablet, Take 1 tablet (0.25 mg total) by mouth 2 (two) times daily as needed for anxiety. .  Cholecalciferol (VITAMIN D3 PO), Take 2,000 Units/day by  mouth. .  dutasteride (AVODART) 0.5 MG capsule, Take 0.5 mg by mouth daily. .  fluocinonide ointment (LIDEX) 9.62 %, Apply 1 application topically 2 (two) times daily. .  Polyethylene Glycol 3350 (MIRALAX PO), Take by mouth. .  psyllium (METAMUCIL) 58.6 % packet, Take 1 packet by mouth daily. .  tamsulosin (FLOMAX) 0.4 MG CAPS capsule, Take 0.4 mg by mouth daily. Marland Kitchen  zolpidem (AMBIEN) 10 MG tablet, Take 1 tablet (10 mg total) by mouth at bedtime as needed. for sleep   Reviewed prior external information including  notes and imaging from  primary care provider As well as notes that were available from care everywhere and other healthcare systems.  Past medical history, social, surgical and family history all reviewed in electronic medical record.  [name] pertanent information unless stated regarding to the chief complaint.   Review of Systems:  [name] headache, visual changes, nausea, vomiting, diarrhea, constipation, dizziness, abdominal pain, skin rash, fevers, chills, night sweats, weight loss, swollen lymph nodes, body aches, joint swelling, chest pain, shortness of breath, mood changes. POSITIVE muscle aches  Objective  Blood pressure 130/84, pulse (!) 55, height 5\' 10"  (1.778 m), weight 141 lb (64 kg), SpO2 98 %.   General: [name] apparent distress alert and oriented x3 mood and affect normal, dressed appropriately.  HEENT: Pupils equal, extraocular movements intact  Respiratory: Patient's speak in full sentences and does not appear short of breath  Cardiovascular: [name] lower extremity edema, non tender, [name] erythema  Neuro: Cranial nerves II through XII are intact, neurovascularly intact in all extremities with 2+ DTRs and 2+ pulses.  Gait normal with good balance and coordination.  MSK: Moderate arthritic changes of multiple joints.  Severely tender to palpation over the greater trochanteric area right greater than left.   Procedure: Real-time Ultrasound Guided Injection of right greater  trochanteric bursitis secondary to patient's body habitus Device: GE Logiq Q7 Ultrasound guided injection is preferred based studies that show increased duration, increased effect, greater accuracy, decreased procedural pain, increased response rate, and decreased cost with ultrasound guided versus blind injection.  Verbal informed consent obtained.  Time-out conducted.  Noted [name] overlying erythema, induration, or other signs of local infection.  Skin prepped in a sterile fashion.  Local anesthesia: Topical Ethyl chloride.  With sterile technique and under real time ultrasound guidance:  Greater trochanteric area was visualized and patient's bursa was noted. A 22-gauge 3 inch needle was inserted and 4 cc of 0.5% Marcaine and 1 cc of Kenalog 40 mg/dL was injected. Pictures taken Completed without difficulty  Pain immediately resolved suggesting accurate placement of the medication.  Advised to call if fevers/chills, erythema, induration, drainage, or persistent bleeding.  Impression: Technically successful ultrasound guided injection.   Procedure: Real-time Ultrasound Guided Injection of left  greater trochanteric bursitis secondary to patient's body habitus Device: GE Logiq Q7  Ultrasound guided injection is preferred based studies that show increased duration, increased effect, greater accuracy, decreased procedural pain, increased response rate, and decreased cost with ultrasound guided versus blind injection.  Verbal informed consent obtained.  Time-out conducted.  Noted [name] overlying erythema, induration, or other signs of local infection.  Skin prepped in a sterile fashion.  Local anesthesia: Topical Ethyl chloride.  With sterile technique and under real time ultrasound guidance:  Greater trochanteric area was visualized and patient's bursa was noted. A 22-gauge 3 inch needle was inserted and 4 cc of 0.5% Marcaine and 1 cc of Kenalog 40 mg/dL was injected. Pictures taken Completed without  difficulty  Pain immediately resolved suggesting accurate placement of the medication.  Advised to call if fevers/chills, erythema, induration, drainage, or persistent bleeding.  Impression: Technically successful ultrasound guided injection.    Impression and Recommendations:     The above documentation has been reviewed and is accurate and complete Lyndal Pulley, DO       Note: This dictation was prepared with Dragon dictation along with smaller phrase technology. Any transcriptional errors that result from this process are unintentional.

## 2020-01-17 ENCOUNTER — Other Ambulatory Visit: Payer: Self-pay | Admitting: Internal Medicine

## 2020-01-17 DIAGNOSIS — F5104 Psychophysiologic insomnia: Secondary | ICD-10-CM

## 2020-01-17 MED ORDER — ZOLPIDEM TARTRATE 10 MG PO TABS: 10.0000 mg | ORAL_TABLET | Freq: Every evening | ORAL | 1 refills | Status: DC | PRN

## 2020-01-24 DIAGNOSIS — Z85820 Personal history of malignant melanoma of skin: Secondary | ICD-10-CM | POA: Diagnosis not present

## 2020-01-24 DIAGNOSIS — L814 Other melanin hyperpigmentation: Secondary | ICD-10-CM | POA: Diagnosis not present

## 2020-01-24 DIAGNOSIS — L821 Other seborrheic keratosis: Secondary | ICD-10-CM | POA: Diagnosis not present

## 2020-01-24 DIAGNOSIS — D1801 Hemangioma of skin and subcutaneous tissue: Secondary | ICD-10-CM | POA: Diagnosis not present

## 2020-01-24 DIAGNOSIS — S30861D Insect bite (nonvenomous) of abdominal wall, subsequent encounter: Secondary | ICD-10-CM | POA: Diagnosis not present

## 2020-01-24 DIAGNOSIS — L570 Actinic keratosis: Secondary | ICD-10-CM | POA: Diagnosis not present

## 2020-01-24 DIAGNOSIS — Z85828 Personal history of other malignant neoplasm of skin: Secondary | ICD-10-CM | POA: Diagnosis not present

## 2020-02-11 NOTE — Progress Notes (Signed)
Patient ID: [name] [name], male   DOB: 1932-08-22, 86 y.o.   MRN: [id number]     86 y.o. history of CAD. Stent to LAD in 2008.  ETT in 2013 abnormal with NSVT. Cath 05/01/12 30% ISR LaD and distal RCA 60-75% with FFR CT .92 Rx medically Myovue done 04/27/17 reviewed and normal with no ischemia study not gated due to PAC;s EF normal echo 06/25/15 55-60% LA only mildly dilated   Continues to have episodes of transient visual loss.  ? Occular migraines Been going on for over 3-4 years  Seen by neurology And MRI/MRA no abnormality and EEG no epileptic foci  01/11/14  1-39% bilateral carotid disease   Daughter in [place] has a hypochondriac for husband Daughter in [place] not working as [place] but he likes her husband better Son works with him and may open a Administrator on [place]  No chest pain   06/11/19 had some diaphoresis / diarrhea with low BP ?  Related to food or Amitiza presecribed by Dr Henrene Pastor for constipation resolved in 24 hours   Has had vaccine Walks 2 miles / day at [place] with no angina Has lower lumbar disc issue and bilateral hip pain injected by Dr Hulan Saas 01/13/20   He is going to [place] this month to see his cousin [name] who is a famous Curator   ROS: Denies fever, malais, weight loss, blurry vision, decreased visual acuity, cough, sputum, SOB, hemoptysis, pleuritic pain, palpitaitons, heartburn, abdominal pain, melena, lower extremity edema, claudication, or rash.  All other systems reviewed and negative  General: BP 124/68    Pulse 86    Ht 5\' 10"  (1.778 m)    Wt 142 lb 3.2 oz (64.5 kg)    SpO2 96%    BMI 20.40 kg/m  Affect appropriate Healthy:  appears stated age 26: normal Neck supple with no adenopathy JVP normal no bruits no thyromegaly Lungs clear with no wheezing and good diaphragmatic motion Heart:  S1/S2 no murmur, no rub, gallop or click PMI normal Abdomen: benighn, BS positve, no tenderness, no AAA no bruit.  No HSM or HJR Distal pulses  intact with no bruits No edema Neuro non-focal Skin warm and dry No muscular weakness     Current Outpatient Medications  Medication Sig Dispense Refill   ALPRAZolam (XANAX) 0.25 MG tablet Take 1 tablet (0.25 mg total) by mouth 2 (two) times daily as needed for anxiety. 60 tablet 3   Cholecalciferol (VITAMIN D3 PO) Take 2,000 Units/day by mouth.     dutasteride (AVODART) 0.5 MG capsule Take 0.5 mg by mouth daily.     fluocinonide ointment (LIDEX) 9.32 % Apply 1 application topically 2 (two) times daily. 60 g 1   fluorouracil (EFUDEX) 5 % cream Apply 1 application topically 2 (two) times daily. Use as directed     fluticasone (FLONASE) 50 MCG/ACT nasal spray USE 2 SPRAYS IN EACH NOSTRIL DAILY 32 g 11   hydrocortisone 2.5 % cream Apply 1 application topically 2 (two) times daily.     ipratropium (ATROVENT) 0.03 % nasal spray USE 2 SPRAYS INTO BOTH NOSTRILS EVERY 12 HOURS 90 mL 2   lisinopril (ZESTRIL) 10 MG tablet TAKE 1 TABLET DAILY 90 tablet 2   metoprolol succinate (TOPROL-XL) 25 MG 24 hr tablet TAKE 1 TABLET BY MOUTH DAILY 90 tablet 3   nitroGLYCERIN (NITROSTAT) 0.4 MG SL tablet Place 1 tablet (0.4 mg total) under the tongue every 5 (five) minutes as needed  for chest pain (up to 3 doses only). 25 tablet 7   Polyethylene Glycol 3350 (MIRALAX PO) Take by mouth.     psyllium (METAMUCIL) 58.6 % packet Take 1 packet by mouth daily.     rosuvastatin (CRESTOR) 40 MG tablet TAKE 1 TABLET DAILY 90 tablet 1   tamsulosin (FLOMAX) 0.4 MG CAPS capsule Take 0.4 mg by mouth daily.     zolpidem (AMBIEN) 10 MG tablet Take 1 tablet (10 mg total) by mouth at bedtime as needed. for sleep 90 tablet 1   No current facility-administered medications for this visit.    Allergies  Patient has no known allergies.  Electrocardiogram:   06/15/17 SR rate 77 RBBB   Assessment and Plan  CAD: Stable with no angina and good activity level.  Continue medical Rx Stent LAD 2008  Moderate distal  RCA with normal flow wire 04/2012  Non ischemic myovue 04/27/17 will repeat in December 2021   HLD : on generic crestor now  Lab Results  Component Value Date   LDLCALC 56 10/18/2019    GERD:with constipation not helped with Miralax, ducolox or Linzess f/u Dr Satira Anis:  Chronic on ambien f/u Dr Ronnald Ramp   Visual Disturbance:  MRI/MRA normal EEG normal has been seen by neurology    F/U with me in 6 months Myovue December 2021    [name]

## 2020-02-20 ENCOUNTER — Ambulatory Visit (INDEPENDENT_AMBULATORY_CARE_PROVIDER_SITE_OTHER): Payer: Medicare Other | Admitting: Cardiovascular Disease

## 2020-02-20 ENCOUNTER — Other Ambulatory Visit: Payer: Self-pay

## 2020-02-20 ENCOUNTER — Encounter: Payer: Self-pay | Admitting: Cardiovascular Disease

## 2020-02-20 VITALS — BP 124/68 | HR 86 | Ht 70.0 in | Wt 142.2 lb

## 2020-02-20 DIAGNOSIS — I2510 Atherosclerotic heart disease of native coronary artery without angina pectoris: Secondary | ICD-10-CM

## 2020-02-20 NOTE — Patient Instructions (Addendum)
Medication Instructions:  *If you need a refill on your cardiac medications before your next appointment, please call your pharmacy*  Lab Work: If you have labs (blood work) drawn today and your tests are completely normal, you will receive your results only by: Marland Kitchen MyChart Message (if you have MyChart) OR . A paper copy in the mail If you have any lab test that is abnormal or we need to change your treatment, we will call you to review the results.  Testing/Procedures: Your physician has requested that you have en exercise stress myoview. For further information please visit [email or web address]. Please follow instruction sheet, as given.  Follow-Up: At [facility], you and your health needs are our priority.  As part of our continuing mission to provide you with exceptional heart care, we have created designated Provider Care Teams.  These Care Teams include your primary Cardiologist (physician) and Advanced Practice Providers (APPs -  Physician Assistants and Nurse Practitioners) who all work together to provide you with the care you need, when you need it.  We recommend signing up for the patient portal called "MyChart".  Sign up information is provided on this After Visit Summary.  MyChart is used to connect with patients for Virtual Visits (Telemedicine).  Patients are able to view lab/test results, encounter notes, upcoming appointments, etc.  Non-urgent messages can be sent to your provider as well.   To learn more about what you can do with MyChart, go to [email or web address].    Your next appointment:   6 month(s)  The format for your next appointment:   In Person  Provider:   You may see [name], MD or one of the following Advanced Practice Providers on your designated Care Team:    Truitt Merle, NP  Cecilie Kicks, NP  Kathyrn Drown, NP

## 2020-02-21 DIAGNOSIS — R351 Nocturia: Secondary | ICD-10-CM | POA: Diagnosis not present

## 2020-02-21 DIAGNOSIS — R3915 Urgency of urination: Secondary | ICD-10-CM | POA: Diagnosis not present

## 2020-02-21 DIAGNOSIS — N289 Disorder of kidney and ureter, unspecified: Secondary | ICD-10-CM | POA: Diagnosis not present

## 2020-02-22 DIAGNOSIS — H2512 Age-related nuclear cataract, left eye: Secondary | ICD-10-CM | POA: Diagnosis not present

## 2020-02-22 DIAGNOSIS — H524 Presbyopia: Secondary | ICD-10-CM | POA: Diagnosis not present

## 2020-02-28 DIAGNOSIS — N289 Disorder of kidney and ureter, unspecified: Secondary | ICD-10-CM | POA: Diagnosis not present

## 2020-03-03 NOTE — Progress Notes (Signed)
[place] Phone: [phone] Subjective:   [name], am serving as a scribe for Dr. Hulan Saas. This visit occurred during the SARS-CoV-2 public health emergency.  Safety protocols were in place, including screening questions prior to the visit, additional usage of staff PPE, and extensive cleaning of exam room while observing appropriate contact time as indicated for disinfecting solutions.   I'm seeing this patient by the request  of:  [name], MD  CC: Left greater than right hip pain  [id number]    01/04/2020 Patient has done remarkably well for some time but the left side did have a flare given injection by another provider 6 weeks ago but we did decide to do bilateral.  We have discussed with patient previously that the degenerative disc in the lumbar spine could be potentially contributing as well and we will consider the possibility of further intervention.  Patient will increase activity slowly and follow-up with me again in 3 months if necessary   Update 03/03/2020 [name] [name] is a 86 y.o. male coming in with complaint of bilateral hip pain. Patient states that his pain in left hip has increased. Pain radiates into left glute. Painful to lie on side.  Patient has noticed some mild low back pain.  Patient has had x-rays previously that were independently visualized by me today of the lumbar spine showing degenerative disc disease at multiple levels and facet arthropathy.     Past Medical History:  Diagnosis Date  . Anxiety   . CAD (coronary artery disease)    a. s/p stent to LAD 2008;  b. abnormal ETT 11/13 with VTach => LHC pLAD 30% ISR, mCFX 30%, dRCA 60-75% which had progressed from previous study in 2010 => FFR of RCA 0.92 => med Rx   . Carotid stenosis    dopplers 1/14:  0-39% bilateral ICA stenosis  . Colon polyps   . Colon polyps    adenomatous  . Diverticular disease of  colon   . Hemorrhoids    external and internal  . History of BPH   . HTN (hypertension)   . Hx of echocardiogram    a. Echo 9/11:  EF 55-60%, normal motion, grade 2 diastolic dysfunction, mild MR, mild LAE, mild RAE, PASP 33.  Marland Kitchen Hyperlipidemia   . IBS (irritable bowel syndrome)   . Rhinitis   . Stroke, lacunar ([place]) 08/24/2012   Old, 2011 head mri   Past Surgical History:  Procedure Laterality Date  . APPENDECTOMY  1956  . CORONARY STENT PLACEMENT  2008    Successful PCI of the lesion in the proximal LAD using a Promus   . HERNIA REPAIR Right 2005  . NASAL SINUS SURGERY  1975  . PERCUTANEOUS CORONARY STENT INTERVENTION (PCI-S) N/A 05/03/2012   Procedure: PERCUTANEOUS CORONARY STENT INTERVENTION (PCI-S);  Surgeon: Sherren Mocha, MD;  Location: [facility] CATH LAB;  Service: Cardiovascular;  Laterality: N/A;  . Promus DES ok for 3T MRI  2008   Social History   Socioeconomic History  . Marital status: Married    Spouse name: Not on file  . Number of children: 3  . Years of education: 57  . Highest education level: Not on file  Occupational History  . Occupation: businessman, 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RETIRED  Tobacco Use  . Smoking status: Never Smoker  . Smokeless tobacco: Never Used  Vaping Use  . Vaping Use: Never  used  Substance and Sexual Activity  . Alcohol use: Not Currently    Comment: 4-6 drinks/week  . Drug use: No  . Sexual activity: Yes    Partners: Female  Other Topics Concern  . 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 He has two daughters, 1 in [place] 1 in [place] (Dec '12) and a son who works with him. Several grandchildren.Reports that he spends a good deal of time at his home in [place] which he greatly enjoys and his home in the [place]. Enjoys driving his BMW [place] when [place].      ACP -  Yes CPR, yes for short-term mechanical ventilation for reversible disease. Recommended [email or web address] for consideration.   Social Determinants of Health   Financial Resource Strain:   . Difficulty of Paying Living Expenses: Not on file  Food Insecurity:   . Worried About Charity fundraiser in the Last Year: Not on file  . Ran Out of Food in the Last Year: Not on file  Transportation Needs:   . Lack of Transportation (Medical): Not on file  . Lack of Transportation (Non-Medical): Not on file  Physical Activity:   . Days of Exercise per Week: Not on file  . Minutes of Exercise per Session: Not on file  Stress:   . Feeling of Stress : Not on file  Social Connections:   . Frequency of Communication with Friends and Family: Not on file  . Frequency of Social Gatherings with Friends and Family: Not on file  . Attends Religious Services: Not on file  . Active Member of Clubs or Organizations: Not on file  . Attends Archivist Meetings: Not on file  . Marital Status: Not on file   No Known Allergies Family History  Problem Relation Age of Onset  . Coronary artery disease Father   . Heart attack Father   . Rheum arthritis Mother   . Cancer Brother        ? TYPE  . Colon cancer Neg Hx   . Stomach cancer Neg Hx   . Pancreatic cancer Neg Hx      Current Outpatient Medications (Cardiovascular):  .  lisinopril (ZESTRIL) 10 MG tablet, TAKE 1 TABLET DAILY .  metoprolol succinate (TOPROL-XL) 25 MG 24 hr tablet, TAKE 1 TABLET BY MOUTH DAILY .  nitroGLYCERIN (NITROSTAT) 0.4 MG SL tablet, Place 1 tablet (0.4 mg total) under the tongue every 5 (five) minutes as needed for chest pain (up to 3 doses only). .  rosuvastatin (CRESTOR) 40 MG tablet, TAKE 1 TABLET DAILY  Current Outpatient Medications (Respiratory):  .  fluticasone (FLONASE) 50 MCG/ACT nasal spray, USE 2 SPRAYS IN EACH NOSTRIL DAILY .  ipratropium (ATROVENT) 0.03 % nasal spray, USE 2 SPRAYS INTO BOTH  NOSTRILS EVERY 12 HOURS    Current Outpatient Medications (Other):  Marland Kitchen  ALPRAZolam (XANAX) 0.25 MG tablet, Take 1 tablet (0.25 mg total) by mouth 2 (two) times daily as needed for anxiety. .  Cholecalciferol (VITAMIN D3 PO), Take 2,000 Units/day by mouth. .  dutasteride (AVODART) 0.5 MG capsule, Take 0.5 mg by mouth daily. .  fluocinonide ointment (LIDEX) 0.26 %, Apply 1 application topically 2 (two) times daily. .  fluorouracil (EFUDEX) 5 % cream, Apply 1 application topically 2 (two) times daily. Use as directed .  hydrocortisone 2.5 % cream, Apply  1 application topically 2 (two) times daily. .  Polyethylene Glycol 3350 (MIRALAX PO), Take by mouth. .  psyllium (METAMUCIL) 58.6 % packet, Take 1 packet by mouth daily. .  tamsulosin (FLOMAX) 0.4 MG CAPS capsule, Take 0.4 mg by mouth daily. Marland Kitchen  zolpidem (AMBIEN) 10 MG tablet, Take 1 tablet (10 mg total) by mouth at bedtime as needed. for sleep .  gabapentin (NEURONTIN) 100 MG capsule, Take 2 capsules (200 mg total) by mouth at bedtime.   Reviewed prior external information including notes and imaging from  primary care provider As well as notes that were available from care everywhere and other healthcare systems.  Past medical history, social, surgical and family history all reviewed in electronic medical record.  No pertanent information unless stated regarding to the chief complaint.   Review of Systems:  No headache, visual changes, nausea, vomiting, diarrhea, constipation, dizziness, abdominal pain, skin rash, fevers, chills, night sweats, weight loss, swollen lymph nodes, body aches, joint swelling, chest pain, shortness of breath, mood changes. POSITIVE muscle aches  Objective  Blood pressure 128/70, pulse 67, height 5\' 10"  (1.778 m), weight 144 lb (65.3 kg), SpO2 98 %.   General: No apparent distress alert and oriented x3 mood and affect normal, dressed appropriately.  HEENT: Pupils equal, extraocular movements intact    Respiratory: Patient's speak in full sentences and does not appear short of breath  Cardiovascular: No lower extremity edema, non tender, no erythema  Neuro: Cranial nerves II through XII are intact, neurovascularly intact in all extremities with 2+ DTRs and 2+ pulses.  Gait normal with good balance and coordination.  MSK: Low back exam does have loss of lordosis.  Patient does have tightness in the paraspinal musculature lumbar spine left greater than right.  Patient does have tightness also noted over the greater trochanteric area with tenderness noted.  Positive Faber on the left negative straight leg test   Procedure: Real-time Ultrasound Guided Injection of left  greater trochanteric bursitis secondary to patient's body habitus Device: GE Logiq Q7  Ultrasound guided injection is preferred based studies that show increased duration, increased effect, greater accuracy, decreased procedural pain, increased response rate, and decreased cost with ultrasound guided versus blind injection.  Verbal informed consent obtained.  Time-out conducted.  Noted no overlying erythema, induration, or other signs of local infection.  Skin prepped in a sterile fashion.  Local anesthesia: Topical Ethyl chloride.  With sterile technique and under real time ultrasound guidance:  Greater trochanteric area was visualized and patient's bursa was noted. A 22-gauge 3 inch needle was inserted and 4 cc of 0.5% Marcaine and 1 cc of Kenalog 40 mg/dL was injected. Pictures taken Completed without difficulty  Pain immediately resolved suggesting accurate placement of the medication.  Advised to call if fevers/chills, erythema, induration, drainage, or persistent bleeding.  Impression: Technically successful ultrasound guided injection.    Impression and Recommendations:     The above documentation has been reviewed and is accurate and complete Lyndal Pulley, DO

## 2020-03-04 ENCOUNTER — Ambulatory Visit: Payer: Self-pay

## 2020-03-04 ENCOUNTER — Encounter: Payer: Self-pay | Admitting: Family Medicine

## 2020-03-04 ENCOUNTER — Ambulatory Visit (INDEPENDENT_AMBULATORY_CARE_PROVIDER_SITE_OTHER): Payer: Medicare Other | Admitting: Family Medicine

## 2020-03-04 ENCOUNTER — Other Ambulatory Visit: Payer: Self-pay

## 2020-03-04 VITALS — BP 128/70 | HR 67 | Ht 70.0 in | Wt 144.0 lb

## 2020-03-04 DIAGNOSIS — I2510 Atherosclerotic heart disease of native coronary artery without angina pectoris: Secondary | ICD-10-CM | POA: Diagnosis not present

## 2020-03-04 DIAGNOSIS — M7062 Trochanteric bursitis, left hip: Secondary | ICD-10-CM

## 2020-03-04 DIAGNOSIS — M7061 Trochanteric bursitis, right hip: Secondary | ICD-10-CM | POA: Diagnosis not present

## 2020-03-04 DIAGNOSIS — M25552 Pain in left hip: Secondary | ICD-10-CM

## 2020-03-04 MED ORDER — GABAPENTIN 100 MG PO CAPS: 200.0000 mg | ORAL_CAPSULE | Freq: Every day | ORAL | 0 refills | Status: DC

## 2020-03-04 NOTE — Assessment & Plan Note (Signed)
Injection given today.  Concern for differential with a lumbar radiculopathy.  Patient has had known degenerative disc disease from x-rays in 2020.  Patient will start on gabapentin, warned potential side effects.  Discussed icing regimen and home exercises, increase activity slowly.  Follow-up again 4 to 8 weeks

## 2020-03-04 NOTE — Patient Instructions (Addendum)
You know the drill Try gabapentin 200mg  at night; if groggy in morning can drop to 100mg  at night See me in 8-10 weeks

## 2020-03-14 ENCOUNTER — Other Ambulatory Visit: Payer: Self-pay | Admitting: Cardiovascular Disease

## 2020-04-09 ENCOUNTER — Other Ambulatory Visit: Payer: Self-pay | Admitting: Internal Medicine

## 2020-04-09 DIAGNOSIS — F5104 Psychophysiologic insomnia: Secondary | ICD-10-CM

## 2020-04-15 ENCOUNTER — Other Ambulatory Visit: Payer: Self-pay

## 2020-04-15 DIAGNOSIS — E785 Hyperlipidemia, unspecified: Secondary | ICD-10-CM

## 2020-04-15 DIAGNOSIS — I2510 Atherosclerotic heart disease of native coronary artery without angina pectoris: Secondary | ICD-10-CM

## 2020-04-15 NOTE — Progress Notes (Signed)
Order for [name] [name] was placed at office visit on 02/20/20. Scheduling needs Informed Consent order done. Will forward to Dr. Johnsie Cancel for him to sign.

## 2020-04-15 NOTE — Progress Notes (Signed)
Order not showing up in q for me to sign ?

## 2020-04-16 DIAGNOSIS — R3915 Urgency of urination: Secondary | ICD-10-CM | POA: Diagnosis not present

## 2020-04-16 DIAGNOSIS — R351 Nocturia: Secondary | ICD-10-CM | POA: Diagnosis not present

## 2020-04-17 ENCOUNTER — Telehealth (HOSPITAL_COMMUNITY): Payer: Self-pay

## 2020-04-17 NOTE — Telephone Encounter (Signed)
Spoke with the patient, detailed instructions given. He stated that he would be here for his test. Asked to call back with any questions. [name] [name] EMTP 

## 2020-04-18 ENCOUNTER — Other Ambulatory Visit (HOSPITAL_COMMUNITY): Payer: Medicare Other

## 2020-04-19 ENCOUNTER — Other Ambulatory Visit (HOSPITAL_COMMUNITY)
Admission: RE | Admit: 2020-04-19 | Discharge: 2020-04-19 | Disposition: A | Discharge status: Home / Self Care | Payer: Medicare Other | Source: Ambulatory Visit | Attending: Cardiovascular Disease | Admitting: Cardiovascular Disease

## 2020-04-19 ENCOUNTER — Other Ambulatory Visit (HOSPITAL_COMMUNITY): Payer: Medicare Other

## 2020-04-19 DIAGNOSIS — Z20822 Contact with and (suspected) exposure to covid-19: Secondary | ICD-10-CM | POA: Insufficient documentation

## 2020-04-19 DIAGNOSIS — Z01812 Encounter for preprocedural laboratory examination: Secondary | ICD-10-CM | POA: Diagnosis not present

## 2020-04-20 LAB — SARS CORONAVIRUS 2 (TAT 6-24 HRS): SARS Coronavirus 2: NEGATIVE

## 2020-04-22 ENCOUNTER — Other Ambulatory Visit: Payer: Self-pay

## 2020-04-22 ENCOUNTER — Ambulatory Visit (HOSPITAL_COMMUNITY): Payer: Medicare Other | Attending: Cardiology

## 2020-04-22 DIAGNOSIS — I2510 Atherosclerotic heart disease of native coronary artery without angina pectoris: Secondary | ICD-10-CM

## 2020-04-22 LAB — MYOCARDIAL PERFUSION IMAGING
Estimated workload: 8.1 METS
Exercise duration (min): 6 min
Exercise duration (sec): 46 s
LV dias vol: 83 mL (ref 62–150)
LV sys vol: 39 mL
MPHR: 136 {beats}/min
Peak HR: 122 {beats}/min
Percent HR: 89 %
RPE: 19
Rest HR: 61 {beats}/min
SDS: 0
SRS: 0
SSS: 0
TID: 0.96

## 2020-04-22 MED ORDER — TECHNETIUM TC 99M TETROFOSMIN IV KIT
10.7000 | PACK | Freq: Once | INTRAVENOUS | Status: AC | PRN
  Administered 2020-04-22: 10.7 via INTRAVENOUS
  Filled 2020-04-22: qty 11

## 2020-04-22 MED ORDER — TECHNETIUM TC 99M TETROFOSMIN IV KIT
32.7000 | PACK | Freq: Once | INTRAVENOUS | Status: AC | PRN
  Administered 2020-04-22: 32.7 via INTRAVENOUS
  Filled 2020-04-22: qty 33

## 2020-05-07 NOTE — Progress Notes (Signed)
I, [name] [name], LAT, ATC acting as a scribe for [name] GrahamEvan Camil Hausmann, MD.  [name] is a 86 y.o. male [facility] f/u bilat hip pain, L>R. Pt was previously seen by Dr. Katrinka BlazingSmith on 03/04/20 and reported pain radiates into left glute. Painful to lie on side. Today, pt reports L hip is worse and pain radiates into buttock. Pt c/o not being able to sleep. Pt has been walking, although it does hurt. Pt locates pain to L lateral hip w/ minimal radiating pain down leg.  He has had several repeated greater trochanter injections in the past.  These tend to help but last around 3 months.  He has not had physical therapy for this issue recently.  He is leaving for a [place] vacation on New Year's Eve.  Dx imaging: 01/04/20 L-spine XR   Pertinent review of systems: No fevers or chills  Relevant historical information: Hypertension   Exam:  BP 132/84 (BP Location: Right Arm, Patient Position: Sitting, Cuff Size: Normal)    Pulse 85    Ht 5\' 10"  (1.778 m)    Wt 148 lb 8 oz (67.4 kg)    SpO2 98%    BMI 21.31 kg/m  General: Well Developed, well nourished, and in no acute distress.   MSK: Left hip then otherwise normal. Tender palpation greater trochanter. Hip adduction strength diminished.    Lab and Radiology Results Procedure: Real-time Ultrasound Guided Injection of left hip greater trochanter bursa Device: Philips Affiniti 50G Images permanently stored and available for review in PACS Verbal informed consent obtained.  Discussed risks and benefits of procedure. Warned about infection bleeding damage to structures skin hypopigmentation and fat atrophy among others. Patient expresses understanding and agreement Time-out conducted.   Noted no overlying erythema, induration, or other signs of local infection.   Skin prepped in a sterile fashion.   Local anesthesia: Topical Ethyl chloride.   With sterile technique and under real time  ultrasound guidance:  40 mg of Kenalog and 2 mL of Marcaine injected into greater trochanter bursa. Fluid seen entering the bursa sac.   Completed without difficulty   Pain immediately resolved suggesting accurate placement of the medication.   Advised to call if fevers/chills, erythema, induration, drainage, or persistent bleeding.   Images permanently stored and available for review in the ultrasound unit.  Impression: Technically successful ultrasound guided injection.         Assessment and Plan: 86 y.o. male with left hip pain due to greater trochanteric bursitis.  This is a recurrent problem.  Happy to repeat injection today however I think we should start thinking about a more definitive approach.  Plan for referral to physical therapy.  I think strengthening will help prevent this from coming back more easily.   PDMP not reviewed this encounter. Orders Placed This Encounter  Procedures   US LIMITED JOINT SPACE STRUCTURES LOW LEFT(NO LINKED CHARGES)    Order Specific Question:   Reason for Exam (SYMPTOM  OR DIAGNOSIS REQUIRED)    Answer:   hip inj    Order Specific Question:   Preferred imaging location?    [facility]   Ambulatory referral to Physical Therapy    Referral Priority:   Routine    Referral Type:   Physical Medicine    Referral Reason:   Specialty Services Required    Requested Specialty:   Physical Therapy   No orders of  the defined types were placed in this encounter.    Discussed warning signs or symptoms. Please see discharge instructions. Patient expresses understanding.   The above documentation has been reviewed and is accurate and complete [name], M.D.

## 2020-05-08 ENCOUNTER — Ambulatory Visit (INDEPENDENT_AMBULATORY_CARE_PROVIDER_SITE_OTHER): Payer: Medicare Other | Admitting: Family Medicine

## 2020-05-08 ENCOUNTER — Other Ambulatory Visit: Payer: Self-pay

## 2020-05-08 ENCOUNTER — Ambulatory Visit: Payer: Self-pay

## 2020-05-08 VITALS — BP 132/84 | HR 85 | Ht 70.0 in | Wt 148.5 lb

## 2020-05-08 DIAGNOSIS — M25552 Pain in left hip: Secondary | ICD-10-CM

## 2020-05-08 DIAGNOSIS — M7062 Trochanteric bursitis, left hip: Secondary | ICD-10-CM | POA: Diagnosis not present

## 2020-05-08 DIAGNOSIS — M7061 Trochanteric bursitis, right hip: Secondary | ICD-10-CM | POA: Diagnosis not present

## 2020-05-08 DIAGNOSIS — I2510 Atherosclerotic heart disease of native coronary artery without angina pectoris: Secondary | ICD-10-CM | POA: Diagnosis not present

## 2020-05-08 MED ORDER — ROSUVASTATIN CALCIUM 40 MG PO TABS
40.0000 mg | ORAL_TABLET | Freq: Every day | ORAL | 0 refills | Status: DC
Start: 2020-05-08 — End: 2020-09-02

## 2020-05-08 MED ORDER — ROSUVASTATIN CALCIUM 40 MG PO TABS
40.0000 mg | ORAL_TABLET | Freq: Every day | ORAL | 3 refills | Status: DC
Start: 2020-05-08 — End: 2020-05-08

## 2020-05-08 NOTE — Patient Instructions (Signed)
Thank you for coming in today.   Call or go to the ER if you develop a large red swollen joint with extreme pain or oozing puss.    I've referred you to Physical Therapy.  Let us know if you don't hear from them in one week.    

## 2020-05-08 NOTE — Telephone Encounter (Signed)
Pt's medication was sent to pt's pharmacy as requested. Confirmation received.  °

## 2020-05-08 NOTE — Addendum Note (Signed)
Addended by: Carter Kitten D on: 05/08/2020 10:13 AM   Modules accepted: Orders

## 2020-05-29 ENCOUNTER — Other Ambulatory Visit: Payer: Self-pay | Admitting: Internal Medicine

## 2020-05-29 ENCOUNTER — Ambulatory Visit: Payer: Medicare Other | Admitting: Physical Therapy

## 2020-05-29 ENCOUNTER — Telehealth: Payer: Self-pay | Admitting: Internal Medicine

## 2020-05-29 DIAGNOSIS — J208 Acute bronchitis due to other specified organisms: Secondary | ICD-10-CM | POA: Insufficient documentation

## 2020-05-29 DIAGNOSIS — U071 COVID-19: Secondary | ICD-10-CM | POA: Insufficient documentation

## 2020-05-29 MED ORDER — DEXAMETHASONE 6 MG PO TABS: 6.0000 mg | ORAL_TABLET | Freq: Every day | ORAL | 0 refills | Status: AC

## 2020-05-29 NOTE — Telephone Encounter (Signed)
Patients son called and said that the patient had positive at home Covid 19 test. He tested positive on 1.13.22. He said that the patient is having coughing and a low grade fever. He was wondering if there was anything that he could take to help with the symptoms. He was also wondering about the Paxlovid pill for the patient. Please call the patients son back at [phone].

## 2020-05-30 ENCOUNTER — Other Ambulatory Visit: Payer: Self-pay | Admitting: Family Medicine

## 2020-05-30 ENCOUNTER — Ambulatory Visit: Payer: Medicare Other

## 2020-06-10 DIAGNOSIS — R3915 Urgency of urination: Secondary | ICD-10-CM | POA: Diagnosis not present

## 2020-06-17 DIAGNOSIS — R3915 Urgency of urination: Secondary | ICD-10-CM | POA: Diagnosis not present

## 2020-06-24 ENCOUNTER — Encounter: Payer: Self-pay | Admitting: Internal Medicine

## 2020-06-24 DIAGNOSIS — R3915 Urgency of urination: Secondary | ICD-10-CM | POA: Diagnosis not present

## 2020-06-26 ENCOUNTER — Other Ambulatory Visit: Payer: Self-pay

## 2020-06-26 ENCOUNTER — Ambulatory Visit (INDEPENDENT_AMBULATORY_CARE_PROVIDER_SITE_OTHER): Payer: Medicare Other | Admitting: Family Medicine

## 2020-06-26 ENCOUNTER — Ambulatory Visit: Payer: Self-pay

## 2020-06-26 VITALS — BP 128/78 | HR 83 | Ht 70.0 in | Wt 142.2 lb

## 2020-06-26 DIAGNOSIS — M7061 Trochanteric bursitis, right hip: Secondary | ICD-10-CM

## 2020-06-26 DIAGNOSIS — M25511 Pain in right shoulder: Secondary | ICD-10-CM

## 2020-06-26 DIAGNOSIS — M7062 Trochanteric bursitis, left hip: Secondary | ICD-10-CM | POA: Diagnosis not present

## 2020-06-26 DIAGNOSIS — M25512 Pain in left shoulder: Secondary | ICD-10-CM

## 2020-06-26 NOTE — Progress Notes (Signed)
I, [name], LAT, ATC acting as a scribe for [name] Leader, MD.  [name] [name] is a 87 y.o. male who presents to [place] at [facility] today for R shoulder pain ongoing for 05/28/20. MOI: 05/28/20, pt developed COVID. Pt was laying bed and went to use R arm to push himself up and experienced a "crack" in R upper arm. Pt was last seen by Dr. Georgina Snell on 05/08/20 for L GT bursitis and was referred to PT. Today, pt locates pain in R upper arm and is unsure if pain is coming from his L elbow or L shoulder.  Radiates: no UE numbness/tingling: no UE weakness: unable to swim and do push ups Aggravates: no Rx tried: no    Pertinent review of systems: No fevers or chills  Relevant historical information: Hypertension    Exam:  BP 128/78 (BP Location: Right Arm, Patient Position: Sitting, Cuff Size: Normal)   Pulse 83   Ht 5\' 10"  (1.778 m)   Wt 142 lb 3.2 oz (64.5 kg)   SpO2 96%   BMI 20.40 kg/m  General: Well Developed, well nourished, and in no acute distress.   MSK: Right shoulder normal-appearing Normal motion abduction external and internal rotation. Particularly tender to palpation. Intact strength abduction external and internal rotation. Minimally positive Hawkins and Neer's test. Positive empty can test. Negative Yergason's and speeds test.  Right elbow normal-appearing nontender normal motion normal strength.    Lab and Radiology Results   Procedure: Real-time Ultrasound Guided Injection of right shoulder subacromial bursa Device: Philips Affiniti 50G Images permanently stored and available for review in PACS Inspection prior to injection reveals hypoechoic fluid tracking in biceps tendon sheath surrounding tendon however tendon is intact.  Intact rotator cuff tendon at subscapularis supraspinatus and infraspinatus.  Moderate subacromial bursitis.  AC DJD with effusion. Verbal informed consent obtained.  Discussed risks and benefits of  procedure. Warned about infection bleeding damage to structures skin hypopigmentation and fat atrophy among others. Patient expresses understanding and agreement Time-out conducted.   Noted no overlying erythema, induration, or other signs of local infection.   Skin prepped in a sterile fashion.   Local anesthesia: Topical Ethyl chloride.   With sterile technique and under real time ultrasound guidance:  40 mg of Kenalog and 2 mL of Marcaine injected into subacromial bursa. Fluid seen entering the bursa.   Completed without difficulty   Pain immediately resolved suggesting accurate placement of the medication.   Advised to call if fevers/chills, erythema, induration, drainage, or persistent bleeding.   Images permanently stored and available for review in the ultrasound unit.  Impression: Technically successful ultrasound guided injection.         Assessment and Plan: 87 y.o. male with right shoulder pain.  Pain thought to be due to subacromial bursitis.  Patient does not have evidence of rotator cuff tear on ultrasound or physical exam today.  He has great strength and a relatively benign exam.  Discussed options.  Plan for referral to physical therapy.  Additionally proceed with injection.  Recheck back if not improving.   PDMP not reviewed this encounter. Orders Placed This Encounter  Procedures  . [place] LIMITED JOINT SPACE STRUCTURES UP RIGHT(NO LINKED CHARGES)    Standing Status:   Future    Number of Occurrences:   1    Standing Expiration Date:   12/24/2020    Order Specific Question:   Reason for Exam (SYMPTOM  OR DIAGNOSIS REQUIRED)  Answer:   acute right shoulder pain    Order Specific Question:   Preferred imaging location?    Answer:   [facility] [place]  . Ambulatory referral to Physical Therapy    Referral Priority:   Routine    Referral Type:   Physical Medicine    Referral Reason:   Specialty Services Required    Requested Specialty:   Physical  Therapy    Number of Visits Requested:   1   No orders of the defined types were placed in this encounter.    Discussed warning signs or symptoms. Please see discharge instructions. Patient expresses understanding.   The above documentation has been reviewed and is accurate and complete [name] [name], M.D.

## 2020-06-26 NOTE — Patient Instructions (Addendum)
Thank you for coming in today.  Let me know which PT group you want to use  Call or go to the ER if you develop a large red swollen joint with extreme pain or oozing puss.   Recheck with me as needed.

## 2020-07-01 DIAGNOSIS — R3915 Urgency of urination: Secondary | ICD-10-CM | POA: Diagnosis not present

## 2020-07-08 DIAGNOSIS — R3915 Urgency of urination: Secondary | ICD-10-CM | POA: Diagnosis not present

## 2020-07-10 DIAGNOSIS — M7062 Trochanteric bursitis, left hip: Secondary | ICD-10-CM | POA: Diagnosis not present

## 2020-07-10 DIAGNOSIS — M79621 Pain in right upper arm: Secondary | ICD-10-CM | POA: Diagnosis not present

## 2020-07-15 DIAGNOSIS — R3915 Urgency of urination: Secondary | ICD-10-CM | POA: Diagnosis not present

## 2020-07-17 ENCOUNTER — Other Ambulatory Visit: Payer: Self-pay

## 2020-07-17 ENCOUNTER — Ambulatory Visit (INDEPENDENT_AMBULATORY_CARE_PROVIDER_SITE_OTHER): Payer: Medicare Other | Admitting: Family Medicine

## 2020-07-17 ENCOUNTER — Encounter: Payer: Self-pay | Admitting: Family Medicine

## 2020-07-17 ENCOUNTER — Ambulatory Visit (INDEPENDENT_AMBULATORY_CARE_PROVIDER_SITE_OTHER): Payer: Medicare Other

## 2020-07-17 VITALS — BP 140/86 | HR 65 | Ht 70.0 in | Wt 140.8 lb

## 2020-07-17 DIAGNOSIS — M25552 Pain in left hip: Secondary | ICD-10-CM

## 2020-07-17 DIAGNOSIS — G5702 Lesion of sciatic nerve, left lower limb: Secondary | ICD-10-CM | POA: Diagnosis not present

## 2020-07-17 MED ORDER — PREDNISONE 50 MG PO TABS: 50.0000 mg | ORAL_TABLET | Freq: Every day | ORAL | 0 refills | Status: DC

## 2020-07-17 NOTE — Progress Notes (Signed)
I, [name], LAT, ATC, am serving as scribe for Dr. [name].  [name] [name] is a 87 y.o. male who presents to [place] at [facility] today for f/u of L hip/post thigh pain.  He was last seen by Dr. Georgina Snell on 05/08/20 for his hip pain and subsequently on 06/26/20 for his R shoulder.  He had a L GT injection on 05/08/20 and was referred to PT at [facility].  Since his last visit, pt reports that he has been doing PT at [facility] but con't to have pain in both his R arm and L hip.  He has 3 episodes of sharp L post hip and post thigh pain w/ random events/activities including yesterday when he was at [facility] for no known reason.  He took IBU and usually feels better.  Today he locates his pain to his L post thigh that is particularly problematic w/ climbing stairs.  He has more PT visits scheduled but does not feel that they are helping.  Diagnostic testing: L-spine XR- 01/04/19   Pertinent review of systems: No fevers or chills  Relevant historical information: Hypertension   Exam:  BP 140/86 (BP Location: Right Arm, Patient Position: Sitting, Cuff Size: Normal)   Pulse 65   Ht 5\' 10"  (1.778 m)   Wt 140 lb 12.8 oz (63.9 kg)   SpO2 96%   BMI 20.20 kg/m  General: Well Developed, well nourished, and in no acute distress.   MSK: L-spine normal.  Nontender midline normal lumbar motion. Reflexes and sensation are intact distal bilateral extremities. Negative slump test and straight leg raise test left leg.  Left hip normal-appearing Mild tender palpation greater trochanter. Nontender posterior buttocks. Nontender into the hamstring with no palpable defects. Normal hip motion. Strength diminished hip abduction and external rotation but does not reproduce pain. Intact strength without pain to resisted hip extension and resisted knee flexion.  Negative H test. Normal gait.    Lab and Radiology Results  X-ray images left hip obtained today  personally and independently interpreted No fractures or malalignment.  No significant DJD. Await formal radiology review  EXAM: LUMBAR SPINE - COMPLETE 4+ VIEW  COMPARISON:  CT abdomen pelvis-12/30/2016  FINDINGS: There are 5 non rib-bearing lumbar type vertebral bodies.  There is a mild S-shaped scoliotic curvature of the thoracolumbar spine with dominant caudal component convex the right measuring approximately 10 degrees (as measured from the superior endplate of L1 to the inferior endplate of L4). There is minimal (approximately 3 4 mm) of anterolisthesis of L2 upon L3, L3 upon L4 and L4 upon L5. No anterolisthesis. No definite pars defects.  Mild (approximately 25%) compression deformities involving the T12 and L1 vertebral bodies are unchanged since the abdominal CT performed 12/2016. Remaining lumbar vertebral body heights are preserved.  Mild to moderate multilevel lumbar spine DDD, worse at L5-S1 with disc space height loss, endplate irregularity and sclerosis. Note is made of a prominent syndesmophyte about the left lateral aspect of the L2-L3 intervertebral disc space, similar to remote abdominal CT.  Atherosclerotic plaque within the abdominal aorta. Vascular calcifications overlie the left upper abdomen and lower pelvis bilaterally. Moderate to large colonic stool burden without evidence of enteric obstruction.  IMPRESSION: 1. No acute findings. 2. Mild-to-moderate multilevel lumbar spine DDD, worse at L5-S1, likely progressed compared to remote abdominal CT performed 12/2016.   Electronically Signed   By: Sandi Mariscal M.D.   On: 01/04/2019 14:29  I, [name],  personally (independently) visualized and performed the interpretation of the images attached in this note.   Assessment and Plan: 87 y.o. male with left posterior thigh pain in the setting of trochanteric bursitis.  Pain can be intense but relatively short-lived.  I think patient has  developed piriformis syndrome to explain his posterior thigh pain.  He already is receiving physical therapy which should be the right treatment for this.  Doubtful for hamstring injury or lumbar radiculopathy.   He is traveling to [place] soon.  I have prescribed prednisone and gabapentin to use as a backup plan if he worsens.  Although I do think physical therapy is the best treatment.  X-ray obtained today to make sure were not missing any obvious bony causes.  Ultimately may need MRI of hip and possibly even of lumbar spine.   PDMP not reviewed this encounter. Orders Placed This Encounter  Procedures  . DG Hip Unilat W OR W/O Pelvis 2-3 Views Left    Standing Status:   Future    Number of Occurrences:   1    Standing Expiration Date:   07/17/2021    Order Specific Question:   Reason for Exam (SYMPTOM  OR DIAGNOSIS REQUIRED)    Answer:   eval left hip    Order Specific Question:   Preferred imaging location?    Answer:   [facility]  . Ambulatory referral to Physical Therapy    Referral Priority:   Routine    Referral Type:   Physical Medicine    Referral Reason:   Specialty Services Required    Requested Specialty:   Physical Therapy    Number of Visits Requested:   1   Meds ordered this encounter  Medications  . predniSONE (DELTASONE) 50 MG tablet    Sig: Take 1 tablet (50 mg total) by mouth daily.    Dispense:  5 tablet    Refill:  0     Discussed warning signs or symptoms. Please see discharge instructions. Patient expresses understanding.   The above documentation has been reviewed and is accurate and complete [name], M.D.

## 2020-07-17 NOTE — Patient Instructions (Addendum)
Thank you for coming in today.  Please get an Xray today before you leave  I think you have piriformis syndrome.   Continue PT.   If you have a really bad time take the prednisone and gabapentin.    Piriformis Syndrome Rehab Ask your health care provider which exercises are safe for you. Do exercises exactly as told by your health care provider and adjust them as directed. It is normal to feel mild stretching, pulling, tightness, or discomfort as you do these exercises. Stop right away if you feel sudden pain or your pain gets worse. Do not begin these exercises until told by your health care provider. Stretching and range-of-motion exercises These exercises warm up your muscles and joints and improve the movement and flexibility of your hip and pelvis. The exercises also help to relieve pain, numbness, and tingling. Hip rotation This is an exercise in which you lie on your back and stretch the muscles that rotate your hip (hip rotators) to stretch your buttocks. 1. Lie on your back on a firm surface. 2. Pull your left / right knee toward your same shoulder with your left / right hand until your knee is pointing toward the ceiling. Hold your left / right ankle with your other hand. 3. Keeping your knee steady, gently pull your left / right ankle toward your other shoulder until you feel a stretch in your buttocks. 4. Hold this position for __________ seconds. Repeat __________ times. Complete this exercise __________ times a day.   Hip extensor This is an exercise in which you lie on your back and pull your knee to your chest. 1. Lie on your back on a firm surface. Both of your legs should be straight. 2. Pull your left / right knee to your chest. Hold your leg in this position by holding onto the back of your thigh or the front of your knee. 3. Hold this position for __________ seconds. 4. Slowly return to the starting position. Repeat __________ times. Complete this exercise __________  times a day. Strengthening exercises These exercises build strength and endurance in your hip and thigh muscles. Endurance is the ability to use your muscles for a long time, even after they get tired. Straight leg raises, side-lying This exercise strengthens the muscles that rotate the leg at the hip and move it away from your body (hip abductors). 1. Lie on your side with your left / right leg in the top position. Lie so your head, shoulder, knee, and hip line up. Bend your bottom knee to help you balance. 2. Lift your top leg 4-6 inches (10-15 cm) while keeping your toes pointed straight ahead. 3. Hold this position for __________ seconds. 4. Slowly lower your leg to the starting position. 5. Let your muscles relax completely after each repetition. Repeat __________ times. Complete this exercise __________ times a day.   Hip abduction and rotation This is sometimes called quadruped (on hands and knees) exercises. 1. Get on your hands and knees on a firm, lightly padded surface. Your hands should be directly below your shoulders, and your knees should be directly below your hips. 2. Lift your left / right knee out to the side. Keep your knee bent. Do not twist your body. 3. Hold this position for __________ seconds. 4. Slowly lower your leg. Repeat __________ times. Complete this exercise __________ times a day.   Straight leg raises, face-down This exercise stretches the muscles that move your hips away from the front of the  pelvis (hip extensors). 1. Lie on your abdomen on a bed or a firm surface with a pillow under your hips. 2. Squeeze your buttocks muscles and lift your left / right leg about 4-6 inches (10-15 cm) off the bed. Do not let your back arch. 3. Hold this position for __________ seconds. 4. Slowly lower your leg to the starting position. 5. Let your muscles relax completely after each repetition. Repeat __________ times. Complete this exercise __________ times a day. This  information is not intended to replace advice given to you by your health care provider. Make sure you discuss any questions you have with your health care provider. Document Revised: 08/24/2018 Document Reviewed: 02/23/2018 Elsevier Patient Education  2021 [employer].

## 2020-07-18 NOTE — Progress Notes (Signed)
X-ray left hip shows mild arthritis on both sides but no acute findings are present.

## 2020-07-22 ENCOUNTER — Telehealth: Payer: Self-pay | Admitting: Family Medicine

## 2020-07-22 DIAGNOSIS — R3915 Urgency of urination: Secondary | ICD-10-CM | POA: Diagnosis not present

## 2020-07-22 NOTE — Telephone Encounter (Signed)
Pt struggling with his hip. He is doing PT, all exercises and regularly taking a mix of tylenol and advil.  He can walk well on level ground, but struggles with stairs. He does not feel like the PT or exercises are really helping.  Gave him xray results from hip 07/17/2020.  Pt going to [place] next week, made appt with Dr. Georgina Snell for 3/15. He would like to have another injection if possible.

## 2020-07-23 DIAGNOSIS — M79621 Pain in right upper arm: Secondary | ICD-10-CM | POA: Diagnosis not present

## 2020-07-23 DIAGNOSIS — M7062 Trochanteric bursitis, left hip: Secondary | ICD-10-CM | POA: Diagnosis not present

## 2020-07-23 DIAGNOSIS — G5702 Lesion of sciatic nerve, left lower limb: Secondary | ICD-10-CM | POA: Diagnosis not present

## 2020-07-25 DIAGNOSIS — D1801 Hemangioma of skin and subcutaneous tissue: Secondary | ICD-10-CM | POA: Diagnosis not present

## 2020-07-25 DIAGNOSIS — D485 Neoplasm of uncertain behavior of skin: Secondary | ICD-10-CM | POA: Diagnosis not present

## 2020-07-25 DIAGNOSIS — Z85820 Personal history of malignant melanoma of skin: Secondary | ICD-10-CM | POA: Diagnosis not present

## 2020-07-25 DIAGNOSIS — C44729 Squamous cell carcinoma of skin of left lower limb, including hip: Secondary | ICD-10-CM | POA: Diagnosis not present

## 2020-07-25 DIAGNOSIS — Z85828 Personal history of other malignant neoplasm of skin: Secondary | ICD-10-CM | POA: Diagnosis not present

## 2020-07-25 DIAGNOSIS — L308 Other specified dermatitis: Secondary | ICD-10-CM | POA: Diagnosis not present

## 2020-07-25 DIAGNOSIS — L570 Actinic keratosis: Secondary | ICD-10-CM | POA: Diagnosis not present

## 2020-07-25 NOTE — Progress Notes (Signed)
   I, [name], LAT, ATC, am serving as scribe for Dr. [name].  [name] [name] is a 87 y.o. male who presents to [place] at [facility] today for continued L hip/post thigh pain thought to be due to piriformis syndrome. Pt was last seen by Dr. Georgina Snell on 07/17/20 and was prescribed gabapentin, prednisone, and advised to cont PT. Of note, pt has an upcoming trip to [place]. Today, pt reports that he is doing much better today.  He states that he re-started Gabapentin last Wed, taking it in the evening and Tylenol in the morning.  He con't to do his HEP and has been walking.  He will be going to PT again tomorrow.  Dx imaging: 07/17/20 L hip/pelvis XR  01/04/19 L-spine XR  Pertinent review of systems: No fevers or chills  Relevant historical information: History trochanteric bursitis and piriformis syndrome left hip   Exam:  BP (!) 128/58 (BP Location: Left Arm, Patient Position: Sitting, Cuff Size: Normal)   Pulse 72   Ht 5\' 10"  (1.778 m)   Wt 145 lb 9.6 oz (66 kg)   SpO2 97%   BMI 20.89 kg/m  General: Well Developed, well nourished, and in no acute distress.   MSK: Left hip normal-appearing normal motion normal gait    Assessment and Plan: 87 y.o. male with left hip pain improved with time physical therapy and some gabapentin.  Patient is traveling to [place] for 10-day vacation starting in 2 days.  Today's visit was scheduled for the possibility of doing a injection for piriformis syndrome which is now not necessary.  We had a discussion about the potential treatment plan while he is on vacation.  I prescribed prednisone so that he can take a bottle of prednisone with him on vacation in case he needs it with the intention of not actually taking it.  He should continue gabapentin as needed limited at bedtime.  Continue Tylenol and ibuprofen limited doses as well.  Additionally I can certainly call medication into a pharmacy in [place] he will just let me need to know  which one.  Recheck back as needed.  Continue PT and home exercise program.   PDMP not reviewed this encounter. No orders of the defined types were placed in this encounter.  Meds ordered this encounter  Medications  . predniSONE (DELTASONE) 50 MG tablet    Sig: Take 1 tablet (50 mg total) by mouth daily.    Dispense:  5 tablet    Refill:  0     Discussed warning signs or symptoms. Please see discharge instructions. Patient expresses understanding.   The above documentation has been reviewed and is accurate and complete [name], M.D.  Total encounter time 20 minutes including face-to-face time with the patient and, reviewing past medical record, and charting on the date of service.   Treatment plan and options.

## 2020-07-29 ENCOUNTER — Ambulatory Visit (INDEPENDENT_AMBULATORY_CARE_PROVIDER_SITE_OTHER): Payer: Medicare Other | Admitting: Family Medicine

## 2020-07-29 ENCOUNTER — Other Ambulatory Visit: Payer: Self-pay | Admitting: Cardiovascular Disease

## 2020-07-29 ENCOUNTER — Other Ambulatory Visit: Payer: Self-pay

## 2020-07-29 ENCOUNTER — Encounter: Payer: Self-pay | Admitting: Family Medicine

## 2020-07-29 VITALS — BP 128/58 | HR 72 | Ht 70.0 in | Wt 145.6 lb

## 2020-07-29 DIAGNOSIS — G5702 Lesion of sciatic nerve, left lower limb: Secondary | ICD-10-CM | POA: Diagnosis not present

## 2020-07-29 DIAGNOSIS — R3915 Urgency of urination: Secondary | ICD-10-CM | POA: Diagnosis not present

## 2020-07-29 DIAGNOSIS — M25552 Pain in left hip: Secondary | ICD-10-CM | POA: Diagnosis not present

## 2020-07-29 MED ORDER — PREDNISONE 50 MG PO TABS: 50.0000 mg | ORAL_TABLET | Freq: Every day | ORAL | 0 refills | Status: DC

## 2020-07-29 NOTE — Patient Instructions (Addendum)
Thank you for coming in today.  Continue PT.   Please let me know if the prednisone does not arrive.  I will send the prescription to a local pharmacy.  Take the bottle of prednisone with you.  I do not think you will need to ingest the medicine. But I would like you to have the medicine with you if needed.   Let me know if you need something while in [place]. Let me know which pharmacy you would like to use.

## 2020-07-30 DIAGNOSIS — M7062 Trochanteric bursitis, left hip: Secondary | ICD-10-CM | POA: Diagnosis not present

## 2020-07-30 DIAGNOSIS — M79621 Pain in right upper arm: Secondary | ICD-10-CM | POA: Diagnosis not present

## 2020-07-30 DIAGNOSIS — G5702 Lesion of sciatic nerve, left lower limb: Secondary | ICD-10-CM | POA: Diagnosis not present

## 2020-08-12 DIAGNOSIS — R3915 Urgency of urination: Secondary | ICD-10-CM | POA: Diagnosis not present

## 2020-08-19 DIAGNOSIS — R3915 Urgency of urination: Secondary | ICD-10-CM | POA: Diagnosis not present

## 2020-08-26 DIAGNOSIS — R3915 Urgency of urination: Secondary | ICD-10-CM | POA: Diagnosis not present

## 2020-09-02 ENCOUNTER — Other Ambulatory Visit: Payer: Self-pay | Admitting: *Deleted

## 2020-09-02 MED ORDER — ROSUVASTATIN CALCIUM 40 MG PO TABS: 40.0000 mg | ORAL_TABLET | Freq: Every day | ORAL | 0 refills | Status: DC

## 2020-09-05 DIAGNOSIS — Z85828 Personal history of other malignant neoplasm of skin: Secondary | ICD-10-CM | POA: Diagnosis not present

## 2020-09-05 DIAGNOSIS — L718 Other rosacea: Secondary | ICD-10-CM | POA: Diagnosis not present

## 2020-09-05 DIAGNOSIS — C44729 Squamous cell carcinoma of skin of left lower limb, including hip: Secondary | ICD-10-CM | POA: Diagnosis not present

## 2020-09-05 DIAGNOSIS — Z85820 Personal history of malignant melanoma of skin: Secondary | ICD-10-CM | POA: Diagnosis not present

## 2020-09-05 DIAGNOSIS — C44529 Squamous cell carcinoma of skin of other part of trunk: Secondary | ICD-10-CM | POA: Diagnosis not present

## 2020-09-05 DIAGNOSIS — D045 Carcinoma in situ of skin of trunk: Secondary | ICD-10-CM | POA: Diagnosis not present

## 2020-09-05 DIAGNOSIS — D0472 Carcinoma in situ of skin of left lower limb, including hip: Secondary | ICD-10-CM | POA: Diagnosis not present

## 2020-09-05 DIAGNOSIS — D485 Neoplasm of uncertain behavior of skin: Secondary | ICD-10-CM | POA: Diagnosis not present

## 2020-09-16 DIAGNOSIS — Z85828 Personal history of other malignant neoplasm of skin: Secondary | ICD-10-CM | POA: Diagnosis not present

## 2020-09-16 DIAGNOSIS — Z85820 Personal history of malignant melanoma of skin: Secondary | ICD-10-CM | POA: Diagnosis not present

## 2020-09-16 DIAGNOSIS — C44729 Squamous cell carcinoma of skin of left lower limb, including hip: Secondary | ICD-10-CM | POA: Diagnosis not present

## 2020-09-23 DIAGNOSIS — R3915 Urgency of urination: Secondary | ICD-10-CM | POA: Diagnosis not present

## 2020-09-29 DIAGNOSIS — M7062 Trochanteric bursitis, left hip: Secondary | ICD-10-CM | POA: Diagnosis not present

## 2020-09-29 DIAGNOSIS — G5702 Lesion of sciatic nerve, left lower limb: Secondary | ICD-10-CM | POA: Diagnosis not present

## 2020-09-29 DIAGNOSIS — M79621 Pain in right upper arm: Secondary | ICD-10-CM | POA: Diagnosis not present

## 2020-09-30 DIAGNOSIS — H2512 Age-related nuclear cataract, left eye: Secondary | ICD-10-CM | POA: Diagnosis not present

## 2020-09-30 DIAGNOSIS — H00012 Hordeolum externum right lower eyelid: Secondary | ICD-10-CM | POA: Diagnosis not present

## 2020-10-01 DIAGNOSIS — C44729 Squamous cell carcinoma of skin of left lower limb, including hip: Secondary | ICD-10-CM | POA: Diagnosis not present

## 2020-10-01 DIAGNOSIS — L309 Dermatitis, unspecified: Secondary | ICD-10-CM | POA: Diagnosis not present

## 2020-10-07 ENCOUNTER — Ambulatory Visit: Payer: Self-pay

## 2020-10-07 ENCOUNTER — Other Ambulatory Visit: Payer: Self-pay

## 2020-10-07 ENCOUNTER — Encounter: Payer: Self-pay | Admitting: Family Medicine

## 2020-10-07 ENCOUNTER — Ambulatory Visit (INDEPENDENT_AMBULATORY_CARE_PROVIDER_SITE_OTHER): Payer: Medicare Other | Admitting: Family Medicine

## 2020-10-07 VITALS — BP 120/78 | HR 78 | Ht 70.0 in | Wt 146.0 lb

## 2020-10-07 DIAGNOSIS — M25552 Pain in left hip: Secondary | ICD-10-CM | POA: Diagnosis not present

## 2020-10-07 DIAGNOSIS — G8929 Other chronic pain: Secondary | ICD-10-CM | POA: Diagnosis not present

## 2020-10-07 DIAGNOSIS — M25551 Pain in right hip: Secondary | ICD-10-CM

## 2020-10-07 DIAGNOSIS — M25511 Pain in right shoulder: Secondary | ICD-10-CM

## 2020-10-07 DIAGNOSIS — M7062 Trochanteric bursitis, left hip: Secondary | ICD-10-CM

## 2020-10-07 DIAGNOSIS — M7061 Trochanteric bursitis, right hip: Secondary | ICD-10-CM | POA: Diagnosis not present

## 2020-10-07 NOTE — Patient Instructions (Addendum)
Good to see you Injections today xrays today Continue home exercises do not feel you need PT anymore See me again in 6 weeks if no better in 3 weeks call me we will get MRI of shoulder

## 2020-10-07 NOTE — Assessment & Plan Note (Signed)
Patient does have what appears to be calcific changes noted in the glenohumeral joint and does have a trace effusion noted of the glenohumeral joint.  With patient also having hypoechoic changes in the bicep tendon sheath concern for more of a labral pathology.  Patient given injection today.  Would not want any surgical intervention if necessary at this point so [facility].  X-RAYS ARE PENDING.  DISCUSSED DOING HOME EXERCISES AT HOME.  WE WILL SEE PATIENT AGAIN IN 6 WEEKS OTHERWISE

## 2020-10-07 NOTE — Progress Notes (Signed)
[place] Phone: [phone] Subjective:   [name] [name], am serving as a scribe for Dr. Hulan Saas. This visit occurred during the SARS-CoV-2 public health emergency.  Safety protocols were in place, including screening questions prior to the visit, additional usage of staff PPE, and extensive cleaning of exam room while observing appropriate contact time as indicated for disinfecting solutions.   I'm seeing this patient by the request  of:  [name], MD  CC: hip and back pain   [id number]   03/04/2020 Injection given today.  Concern for differential with a lumbar radiculopathy.  Patient has had known degenerative disc disease from x-rays in 2020.  Patient will start on gabapentin, warned potential side effects.  Discussed icing regimen and home exercises, increase activity slowly.  Follow-up again 4 to 8 weeks  Update 10/07/2020 [name] [name] is a 87 y.o. male coming in with complaint of left hip and right shoulder pain. States the pain is chronic. Woke up one morning and felt it crunch when laying on it. Has seen Dr. Georgina Snell about the shoulder. Has been to PT and it made it worse. Mostly pain at night. PT thought the issue could be caused by the neck. Left hip is still painful. Going to PT.   Would like a refill on gabapentin. 180 pills.      Patient did have hip x-rays taken July 18, 2020.  These were independently visualized by me showing very mild arthritic changes of the hips bilaterally.  Past Medical History:  Diagnosis Date  . Anxiety   . CAD (coronary artery disease)    a. s/p stent to LAD 2008;  b. abnormal ETT 11/13 with VTach => LHC pLAD 30% ISR, mCFX 30%, dRCA 60-75% which had progressed from previous study in 2010 => FFR of RCA 0.92 => med Rx   . Carotid stenosis    dopplers 1/14:  0-39% bilateral ICA stenosis  . Colon polyps   . Colon polyps    adenomatous  . Diverticular disease  of colon   . Hemorrhoids    external and internal  . History of BPH   . HTN (hypertension)   . Hx of echocardiogram    a. Echo 9/11:  EF 55-60%, normal motion, grade 2 diastolic dysfunction, mild MR, mild LAE, mild RAE, PASP 33.  Marland Kitchen Hyperlipidemia   . IBS (irritable bowel syndrome)   . Rhinitis   . Stroke, lacunar ([place]) 08/24/2012   Old, 2011 head mri   Past Surgical History:  Procedure Laterality Date  . APPENDECTOMY  1956  . CORONARY STENT PLACEMENT  2008    Successful PCI of the lesion in the proximal LAD using a Promus   . HERNIA REPAIR Right 2005  . NASAL SINUS SURGERY  1975  . PERCUTANEOUS CORONARY STENT INTERVENTION (PCI-S) N/A 05/03/2012   Procedure: PERCUTANEOUS CORONARY STENT INTERVENTION (PCI-S);  Surgeon: Sherren Mocha, MD;  Location: [facility] CATH LAB;  Service: Cardiovascular;  Laterality: N/A;  . Promus DES ok for 3T MRI  2008   Social History   Socioeconomic History  . Marital status: Married    Spouse name: Not on file  . Number of children: 3  . Years of education: 40  . Highest education level: Not on file  Occupational History  . Occupation: businessman, 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RETIRED  Tobacco Use  . Smoking status: Never Smoker  . Smokeless tobacco: Never  Used  Vaping Use  . Vaping Use: Never used  Substance and Sexual Activity  . Alcohol use: Not Currently    Comment: 4-6 drinks/week  . Drug use: No  . Sexual activity: Yes    Partners: Female  Other Topics Concern  . 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 He has two daughters, 1 in [place] 1 in [place] (Dec '12) and a son who works with him. Several grandchildren.Reports that he spends a good deal of time at his home in [place] [facility] which he greatly enjoys and his home in the [place]. Enjoys driving his BMW [place]ible when [place] the [place].      ACP  - Yes CPR, yes for short-term mechanical ventilation for reversible disease. Recommended [email or web address] for consideration.   Social Determinants of Health   Financial Resource Strain: Not on file  Food Insecurity: Not on file  Transportation Needs: Not on file  Physical Activity: Not on file  Stress: Not on file  Social Connections: Not on file   No Known Allergies Family History  Problem Relation Age of Onset  . Coronary artery disease Father   . Heart attack Father   . Rheum arthritis Mother   . Cancer Brother        ? TYPE  . Colon cancer Neg Hx   . Stomach cancer Neg Hx   . Pancreatic cancer Neg Hx     Current Outpatient Medications (Endocrine & Metabolic):  .  predniSONE (DELTASONE) 50 MG tablet, Take 1 tablet (50 mg total) by mouth daily.  Current Outpatient Medications (Cardiovascular):  .  lisinopril (ZESTRIL) 10 MG tablet, TAKE 1 TABLET DAILY .  metoprolol succinate (TOPROL-XL) 25 MG 24 hr tablet, TAKE 1 TABLET BY MOUTH DAILY .  nitroGLYCERIN (NITROSTAT) 0.4 MG SL tablet, Place 1 tablet (0.4 mg total) under the tongue every 5 (five) minutes as needed for chest pain (up to 3 doses only). .  rosuvastatin (CRESTOR) 40 MG tablet, Take 1 tablet (40 mg total) by mouth daily.  Current Outpatient Medications (Respiratory):  .  fluticasone (FLONASE) 50 MCG/ACT nasal spray, USE 2 SPRAYS IN EACH NOSTRIL DAILY .  ipratropium (ATROVENT) 0.03 % nasal spray, USE 2 SPRAYS INTO BOTH NOSTRILS EVERY 12 HOURS    Current Outpatient Medications (Other):  Marland Kitchen  ALPRAZolam (XANAX) 0.25 MG tablet, Take 1 tablet (0.25 mg total) by mouth 2 (two) times daily as needed for anxiety. .  Cholecalciferol (VITAMIN D3 PO), Take 2,000 Units/day by mouth. .  dutasteride (AVODART) 0.5 MG capsule, Take 0.5 mg by mouth daily. .  fluocinonide ointment (LIDEX) 7.86 %, Apply 1 application topically 2 (two) times daily. .  fluorouracil (EFUDEX) 5 % cream, Apply 1 application topically 2 (two) times  daily. Use as directed .  gabapentin (NEURONTIN) 100 MG capsule, TAKE 2 CAPSULES(200 MG) BY MOUTH AT BEDTIME .  hydrocortisone 2.5 % cream, Apply 1 application topically 2 (two) times daily. .  Polyethylene Glycol 3350 (MIRALAX PO), Take by mouth. .  psyllium (METAMUCIL) 58.6 % packet, Take 1 packet by mouth daily. .  tamsulosin (FLOMAX) 0.4 MG CAPS capsule, Take 0.4 mg by mouth daily. Marland Kitchen  zolpidem (AMBIEN) 10 MG tablet, TAKE 1 TABLET(10 MG) BY MOUTH AT BEDTIME AS NEEDED FOR SLEEP   Reviewed prior external information including notes and imaging from  primary care provider As well as notes  that were available from care everywhere and other healthcare systems.  Past medical history, social, surgical and family history all reviewed in electronic medical record.  No pertanent information unless stated regarding to the chief complaint.   Review of Systems:  No headache, visual changes, nausea, vomiting, diarrhea, constipation, dizziness, abdominal pain, skin rash, fevers, chills, night sweats, weight loss, swollen lymph nodes, body aches, joint swelling, chest pain, shortness of breath, mood changes. POSITIVE muscle aches  Objective  There were no vitals taken for this visit.   General: No apparent distress alert and oriented x3 mood and affect normal, dressed appropriately.  HEENT: Pupils equal, extraocular movements intact  Respiratory: Patient's speak in full sentences and does not appear short of breath  Cardiovascular: No lower extremity edema, non tender, no erythema  Gait normal with good balance and coordination.  MSK: Left hip exam shows more tenderness over the greater trochanteric area.  Patient has minimal pain over the piriformis of the gluteal tendon today.  Negative straight leg test.  He does have some mild loss of lordosis.  Right shoulder exam shows the patient does have positive impingement noted.  The patient does have a positive O'Brien's noted.  Tender to palpation  diffusely over the shoulder.  Rotator cuff though does appear to be intact with near complete symmetric strength to the contralateral side.   Procedure: Real-time Ultrasound Guided Injection of left  greater trochanteric bursitis secondary to patient's body habitus Device: GE Logiq Q7  Ultrasound guided injection is preferred based studies that show increased duration, increased effect, greater accuracy, decreased procedural pain, increased response rate, and decreased cost with ultrasound guided versus blind injection.  Verbal informed consent obtained.  Time-out conducted.  Noted no overlying erythema, induration, or other signs of local infection.  Skin prepped in a sterile fashion.  Local anesthesia: Topical Ethyl chloride.  With sterile technique and under real time ultrasound guidance:  Greater trochanteric area was visualized and patient's bursa was noted. A 22-gauge 3 inch needle was inserted and 4 cc of 0.5% Marcaine and 1 cc of Kenalog 40 mg/dL was injected. Pictures taken Completed without difficulty  Pain immediately resolved suggesting accurate placement of the medication.  Advised to call if fevers/chills, erythema, induration, drainage, or persistent bleeding.  Impression: Technically successful ultrasound guided injection.  Limited musculoskeletal ultrasound for the right shoulder was performed and interpreted by Lyndal Pulley  Limited ultrasound of right shoulder shows that patient does have moderate arthritic changes of the acromioclavicular joint and mild of the glenohumeral joint.  Patient does have what appears to be a very small effusion noted of the glenohumeral joint noted with possible deformity.  Rotator cuff appears to be intact but patient does have significant hypoechoic changes of the bicep tendon sheath and abnormality of the anterior labrum noted. Impression: Effusion noted of the glenohumeral joint with some findings consistent with potential labral  pathology  Procedure: Real-time Ultrasound Guided Injection of right glenohumeral joint Device: GE Logiq Q7  Ultrasound guided injection is preferred based studies that show increased duration, increased effect, greater accuracy, decreased procedural pain, increased response rate with ultrasound guided versus blind injection.  Verbal informed consent obtained.  Time-out conducted.  Noted no overlying erythema, induration, or other signs of local infection.  Skin prepped in a sterile fashion.  Local anesthesia: Topical Ethyl chloride.  With sterile technique and under real time ultrasound guidance:  Joint visualized.  23g 1  inch needle inserted posterior approach. Pictures taken for needle placement.  Patient did have injection of 2 cc of 1% lidocaine, 2 cc of 0.5% Marcaine, and 1.0 cc of Kenalog 40 mg/dL. Completed without difficulty  Pain immediately resolved suggesting accurate placement of the medication.  Advised to call if fevers/chills, erythema, induration, drainage, or persistent bleeding.  Impression: Technically successful ultrasound guided injection.   Impression and Recommendations:     The above documentation has been reviewed and is accurate and complete Lyndal Pulley, DO

## 2020-10-07 NOTE — Assessment & Plan Note (Signed)
Patient given injection and tolerated the procedure well.  Discussed icing regimen and home exercise, which activities to do which wants to avoid.  Increase activity slowly over the course of next several weeks.  Patient has responded well to this previously and hopefully well again.  Patient has done physical therapy for more of the piriformis syndrome with very minimal improvement.  We will continue to monitor but otherwise further work-up of the lumbar spine would be necessary.

## 2020-10-10 ENCOUNTER — Other Ambulatory Visit: Payer: Self-pay | Admitting: Family Medicine

## 2020-10-20 ENCOUNTER — Encounter: Payer: Self-pay | Admitting: Internal Medicine

## 2020-10-20 ENCOUNTER — Other Ambulatory Visit: Payer: Self-pay

## 2020-10-20 ENCOUNTER — Ambulatory Visit (INDEPENDENT_AMBULATORY_CARE_PROVIDER_SITE_OTHER): Payer: Medicare Other | Admitting: Internal Medicine

## 2020-10-20 VITALS — BP 132/62 | HR 81 | Temp 98.0°F | Resp 16 | Ht 70.0 in | Wt 140.0 lb

## 2020-10-20 DIAGNOSIS — I10 Essential (primary) hypertension: Secondary | ICD-10-CM

## 2020-10-20 DIAGNOSIS — E559 Vitamin D deficiency, unspecified: Secondary | ICD-10-CM

## 2020-10-20 DIAGNOSIS — F411 Generalized anxiety disorder: Secondary | ICD-10-CM

## 2020-10-20 DIAGNOSIS — E785 Hyperlipidemia, unspecified: Secondary | ICD-10-CM | POA: Diagnosis not present

## 2020-10-20 LAB — LIPID PANEL
Cholesterol: 140 mg/dL (ref 0–200)
HDL: 69.5 mg/dL (ref >39.00)
LDL Cholesterol: 46 mg/dL (ref 0–99)
NonHDL: 70.1
Total CHOL/HDL Ratio: 2
Triglycerides: 121 mg/dL (ref 0.0–149.0)
VLDL: 24.2 mg/dL (ref 0.0–40.0)

## 2020-10-20 LAB — CBC WITH DIFFERENTIAL/PLATELET
Basophils Absolute: 0 10*3/uL (ref 0.0–0.1)
Basophils Relative: 0.5 % (ref 0.0–3.0)
Eosinophils Absolute: 0.2 10*3/uL (ref 0.0–0.7)
Eosinophils Relative: 2.3 % (ref 0.0–5.0)
HCT: 40 % (ref 39.0–52.0)
Hemoglobin: 13.6 g/dL (ref 13.0–17.0)
Lymphocytes Relative: 22.7 % (ref 12.0–46.0)
Lymphs Abs: 1.5 10*3/uL (ref 0.7–4.0)
MCHC: 34.1 g/dL (ref 30.0–36.0)
MCV: 91 fl (ref 78.0–100.0)
Monocytes Absolute: 0.6 10*3/uL (ref 0.1–1.0)
Monocytes Relative: 8.7 % (ref 3.0–12.0)
Neutro Abs: 4.4 10*3/uL (ref 1.4–7.7)
Neutrophils Relative %: 65.8 % (ref 43.0–77.0)
Platelets: 197 10*3/uL (ref 150.0–400.0)
RBC: 4.4 Mil/uL (ref 4.22–5.81)
RDW: 12.6 % (ref 11.5–15.5)
WBC: 6.8 10*3/uL (ref 4.0–10.5)

## 2020-10-20 LAB — MAGNESIUM: Magnesium: 2.1 mg/dL (ref 1.5–2.5)

## 2020-10-20 LAB — HEPATIC FUNCTION PANEL
ALT: 15 U/L (ref 0–53)
AST: 17 U/L (ref 0–37)
Albumin: 4 g/dL (ref 3.5–5.2)
Alkaline Phosphatase: 51 U/L (ref 39–117)
Bilirubin, Direct: 0.1 mg/dL (ref 0.0–0.3)
Total Bilirubin: 0.6 mg/dL (ref 0.2–1.2)
Total Protein: 6.3 g/dL (ref 6.0–8.3)

## 2020-10-20 LAB — BASIC METABOLIC PANEL
BUN: 22 mg/dL (ref 6–23)
CO2: 24 mEq/L (ref 19–32)
Calcium: 9.2 mg/dL (ref 8.4–10.5)
Chloride: 106 mEq/L (ref 96–112)
Creatinine, Ser: 1.03 mg/dL (ref 0.40–1.50)
GFR: 66.27 mL/min (ref >60.00)
Glucose, Bld: 84 mg/dL (ref 70–99)
Potassium: 4.2 mEq/L (ref 3.5–5.1)
Sodium: 137 mEq/L (ref 135–145)

## 2020-10-20 LAB — TSH: TSH: 2.05 u[IU]/mL (ref 0.35–4.50)

## 2020-10-20 LAB — VITAMIN D 25 HYDROXY (VIT D DEFICIENCY, FRACTURES): VITD: 38.36 ng/mL (ref 30.00–100.00)

## 2020-10-20 NOTE — Patient Instructions (Signed)

## 2020-10-20 NOTE — Progress Notes (Signed)
Subjective:  Patient ID: [name] [name], male    DOB: 1933-05-02  Age: 87 y.o. MRN: [id number]  CC: Hypertension  This visit occurred during the SARS-CoV-2 public health emergency.  Safety protocols were in place, including screening questions prior to the visit, additional usage of staff PPE, and extensive cleaning of exam room while observing appropriate contact time as indicated for disinfecting solutions.    HPI [name] [name] presents for f/up -   He tells me his blood pressure has been well controlled.  He is active and denies any recent episodes of dizziness, lightheadedness, diaphoresis, presyncope, syncope, palpitations, chest pain, or shortness of breath.  Outpatient Medications Prior to Visit  Medication Sig Dispense Refill  . ALPRAZolam (XANAX) 0.25 MG tablet Take 1 tablet (0.25 mg total) by mouth 2 (two) times daily as needed for anxiety. 60 tablet 3  . Cholecalciferol (VITAMIN D3 PO) Take 2,000 Units/day by mouth.    . dutasteride (AVODART) 0.5 MG capsule Take 0.5 mg by mouth daily.    Marland Kitchen gabapentin (NEURONTIN) 100 MG capsule TAKE 2 CAPSULES(200 MG) BY MOUTH AT BEDTIME 180 capsule 0  . hydrocortisone 2.5 % cream Apply 1 application topically 2 (two) times daily.    Marland Kitchen lisinopril (ZESTRIL) 10 MG tablet TAKE 1 TABLET DAILY 90 tablet 3  . metoprolol succinate (TOPROL-XL) 25 MG 24 hr tablet TAKE 1 TABLET BY MOUTH DAILY 90 tablet 1  . Polyethylene Glycol 3350 (MIRALAX PO) Take by mouth.    . predniSONE (DELTASONE) 50 MG tablet Take 1 tablet (50 mg total) by mouth daily. 5 tablet 0  . psyllium (METAMUCIL) 58.6 % packet Take 1 packet by mouth daily.    . rosuvastatin (CRESTOR) 40 MG tablet Take 1 tablet (40 mg total) by mouth daily. 15 tablet 0  . tamsulosin (FLOMAX) 0.4 MG CAPS capsule Take 0.4 mg by mouth daily.    Marland Kitchen zolpidem (AMBIEN) 10 MG tablet TAKE 1 TABLET(10 MG) BY MOUTH AT BEDTIME AS NEEDED FOR SLEEP 90 tablet 1  . fluocinonide ointment (LIDEX) 3.71 % Apply 1 application  topically 2 (two) times daily. 60 g 1  . fluorouracil (EFUDEX) 5 % cream Apply 1 application topically 2 (two) times daily. Use as directed    . fluticasone (FLONASE) 50 MCG/ACT nasal spray USE 2 SPRAYS IN EACH NOSTRIL DAILY 32 g 11  . ipratropium (ATROVENT) 0.03 % nasal spray USE 2 SPRAYS INTO BOTH NOSTRILS EVERY 12 HOURS 90 mL 2  . nitroGLYCERIN (NITROSTAT) 0.4 MG SL tablet Place 1 tablet (0.4 mg total) under the tongue every 5 (five) minutes as needed for chest pain (up to 3 doses only). 25 tablet 7   No facility-administered medications prior to visit.    ROS Review of Systems  Constitutional: Negative for diaphoresis, fatigue and unexpected weight change.  HENT: Negative.   Eyes: Negative.   Respiratory: Negative for cough, chest tightness, shortness of breath and wheezing.   Cardiovascular: Negative for chest pain, palpitations and leg swelling.  Gastrointestinal: Negative for abdominal pain, diarrhea, nausea and vomiting.  Endocrine: Negative.   Genitourinary: Negative.   Musculoskeletal: Negative.  Negative for arthralgias.  Skin: Negative.   Allergic/Immunologic: Negative.   Neurological: Negative.  Negative for dizziness, weakness and light-headedness.  Hematological: Negative for adenopathy. Does not bruise/bleed easily.  Psychiatric/Behavioral: Negative.     Objective:  BP 132/62 (BP Location: Left Arm, Patient Position: Sitting, Cuff Size: Large)   Pulse 81   Temp 98 F (36.7 C) (Oral)  Resp 16   Ht 5\' 10"  (1.778 m)   Wt 140 lb (63.5 kg)   SpO2 95%   BMI 20.09 kg/m   BP Readings from Last 3 Encounters:  10/20/20 132/62  10/07/20 120/78  07/29/20 (!) 128/58    Wt Readings from Last 3 Encounters:  10/20/20 140 lb (63.5 kg)  10/07/20 146 lb (66.2 kg)  07/29/20 145 lb 9.6 oz (66 kg)    Physical Exam Vitals reviewed.  HENT:     Nose: Nose normal.     Mouth/Throat:     Mouth: Mucous membranes are moist.  Eyes:     General: No scleral icterus.     Conjunctiva/sclera: Conjunctivae normal.  Cardiovascular:     Rate and Rhythm: Normal rate. Frequent extrasystoles are present.    Heart sounds: No murmur heard.     Comments: EKG - Sinus rhythm with marked SA and occ PVC RBBB No Q waves Pulmonary:     Effort: Pulmonary effort is normal.     Breath sounds: No stridor. No wheezing, rhonchi or rales.  Abdominal:     General: Abdomen is flat.     Palpations: There is no mass.     Tenderness: There is no abdominal tenderness. There is no guarding.  Musculoskeletal:        General: Normal range of motion.     Cervical back: Neck supple.     Right lower leg: No edema.     Left lower leg: No edema.  Skin:    General: Skin is warm and dry.  Neurological:     General: No focal deficit present.     Mental Status: He is alert.     Lab Results  Component Value Date   WBC 6.8 10/20/2020   HGB 13.6 10/20/2020   HCT 40.0 10/20/2020   PLT 197.0 10/20/2020   GLUCOSE 84 10/20/2020   CHOL 140 10/20/2020   TRIG 121.0 10/20/2020   HDL 69.50 10/20/2020   LDLDIRECT 132.2 03/10/2007   LDLCALC 46 10/20/2020   ALT 15 10/20/2020   AST 17 10/20/2020   NA 137 10/20/2020   K 4.2 10/20/2020   CL 106 10/20/2020   CREATININE 1.03 10/20/2020   BUN 22 10/20/2020   CO2 24 10/20/2020   TSH 2.05 10/20/2020   PSA 0.44 05/10/2011   INR 1.0 04/27/2012    No results found.  Assessment & Plan:   [name] [name] was seen today for hypertension.  Diagnoses and all orders for this visit:  Essential hypertension- His BP is adequately well controlled. -     EKG 12-Lead -     CBC with Differential/Platelet; Future -     Magnesium; Future -     Basic metabolic panel; Future -     Basic metabolic panel -     Magnesium -     CBC with Differential/Platelet  Hyperlipidemia with target LDL less than 70- LDL goal achieved. Doing well on the statin -     Lipid panel; Future -     TSH; Future -     Hepatic function panel; Future -     Hepatic function  panel -     TSH -     Lipid panel  Vitamin D deficiency- His D level is normal now. -     VITAMIN D 25 Hydroxy (Vit-D Deficiency, Fractures); Future -     VITAMIN D 25 Hydroxy (Vit-D Deficiency, Fractures)  GAD (generalized anxiety disorder)- Labs are neg for secondary causes. -  TSH; Future -     TSH   I have discontinued Macky Lower "Cooper"'s fluticasone, ipratropium, nitroGLYCERIN, fluocinonide ointment, and fluorouracil. I am also having him maintain his ALPRAZolam, tamsulosin, psyllium, dutasteride, Polyethylene Glycol 3350 (MIRALAX PO), Cholecalciferol (VITAMIN D3 PO), hydrocortisone, lisinopril, zolpidem, predniSONE, metoprolol succinate, rosuvastatin, and gabapentin.  No orders of the defined types were placed in this encounter.    Follow-up: Return in about 6 months (around 04/21/2021).  Scarlette Calico, MD

## 2020-10-21 DIAGNOSIS — R3915 Urgency of urination: Secondary | ICD-10-CM | POA: Diagnosis not present

## 2020-11-04 ENCOUNTER — Other Ambulatory Visit: Payer: Self-pay | Admitting: Internal Medicine

## 2020-11-04 DIAGNOSIS — F5104 Psychophysiologic insomnia: Secondary | ICD-10-CM

## 2020-11-11 ENCOUNTER — Ambulatory Visit (INDEPENDENT_AMBULATORY_CARE_PROVIDER_SITE_OTHER): Payer: Medicare Other

## 2020-11-11 VITALS — Ht 70.0 in | Wt 138.0 lb

## 2020-11-11 DIAGNOSIS — Z0000 Encounter for general adult medical examination without abnormal findings: Secondary | ICD-10-CM

## 2020-11-11 NOTE — Progress Notes (Addendum)
I connected with [name] [name] today by telephone and verified that I am speaking with the correct person using two identifiers. Location patient: home Location provider: work Persons participating in the virtual visit: Reuben Knoblock and Lisette Abu, LPN..   I discussed the limitations, risks, security and privacy concerns of performing an evaluation and management service by telephone and the availability of in person appointments. I also discussed with the patient that there may be a patient responsible charge related to this service. The patient expressed understanding and verbally consented to this telephonic visit.    Interactive audio and video telecommunications were attempted between this provider and patient, however failed, due to patient having technical difficulties OR patient did not have access to video capability.  We continued and completed visit with audio only.  Some vital signs may be absent or patient reported.   Time Spent with patient on telephone encounter: 30 minutes  Subjective:   [name] [name] is a 87 y.o. male who presents for Medicare Annual/Subsequent preventive examination.  Review of Systems     Cardiac Risk Factors include: advanced age (>10men, >44 women);dyslipidemia;family history of premature cardiovascular disease;male gender;hypertension     Objective:    Today's Vitals   11/11/20 0845  Weight: 138 lb (62.6 kg)  Height: 5\' 10"  (1.778 m)   Body mass index is 19.8 kg/m.  Advanced Directives 11/11/2020 11/26/2019 09/17/2019 09/13/2018 08/03/2017 07/25/2016 07/10/2015  Does Patient Have a Medical Advance Directive? Yes No Yes Yes Yes Yes Yes  Type of Advance Directive Living will;Healthcare Power of [place];Living will [place];Living will [place];Living will [place];Living will [place];Living will  Does patient want to make changes to  medical advance directive? No - Patient declined - No - Patient declined - - - No - Patient declined  Copy of [place] in Chart? No - copy requested - No - copy requested No - copy requested No - copy requested Yes Yes  Would patient like information on creating a medical advance directive? - No - Patient declined - - - - -  Pre-existing out of facility DNR order (yellow form or pink MOST form) - - - - - - -    Current Medications (verified) Outpatient Encounter Medications as of 11/11/2020  Medication Sig   ALPRAZolam (XANAX) 0.25 MG tablet Take 1 tablet (0.25 mg total) by mouth 2 (two) times daily as needed for anxiety.   dutasteride (AVODART) 0.5 MG capsule Take 0.5 mg by mouth daily.   gabapentin (NEURONTIN) 100 MG capsule TAKE 2 CAPSULES(200 MG) BY MOUTH AT BEDTIME   hydrocortisone 2.5 % cream Apply 1 application topically 2 (two) times daily.   lisinopril (ZESTRIL) 10 MG tablet TAKE 1 TABLET DAILY   metoprolol succinate (TOPROL-XL) 25 MG 24 hr tablet TAKE 1 TABLET BY MOUTH DAILY   Polyethylene Glycol 3350 (MIRALAX PO) Take by mouth.   predniSONE (DELTASONE) 50 MG tablet Take 1 tablet (50 mg total) by mouth daily.   psyllium (METAMUCIL) 58.6 % packet Take 1 packet by mouth daily.   rosuvastatin (CRESTOR) 40 MG tablet Take 1 tablet (40 mg total) by mouth daily.   tamsulosin (FLOMAX) 0.4 MG CAPS capsule Take 0.4 mg by mouth daily.   zolpidem (AMBIEN) 10 MG tablet TAKE 1 TABLET(10 MG) BY MOUTH AT BEDTIME AS NEEDED FOR SLEEP   Cholecalciferol (VITAMIN D3 PO) Take 2,000 Units/day by mouth.   No  facility-administered encounter medications on file as of 11/11/2020.    Allergies (verified) Patient has no known allergies.   History: Past Medical History:  Diagnosis Date   Anxiety    CAD (coronary artery disease)    a. s/p stent to LAD 2008;  b. abnormal ETT 11/13 with VTach => LHC pLAD 30% ISR, mCFX 30%, dRCA 60-75% which had progressed from previous study in 2010 =>  FFR of RCA 0.92 => med Rx    Carotid stenosis    dopplers 1/14:  0-39% bilateral ICA stenosis   Colon polyps    Colon polyps    adenomatous   Diverticular disease of colon    Hemorrhoids    external and internal   History of BPH    HTN (hypertension)    Hx of echocardiogram    a. Echo 9/11:  EF 55-60%, normal motion, grade 2 diastolic dysfunction, mild MR, mild LAE, mild RAE, PASP 33.   Hyperlipidemia    IBS (irritable bowel syndrome)    Rhinitis    Stroke, lacunar ([place]) 08/24/2012   Old, 2011 head mri   Past Surgical History:  Procedure Laterality Date   APPENDECTOMY  1956   CORONARY STENT PLACEMENT  2008    Successful PCI of the lesion in the proximal LAD using a Promus    HERNIA REPAIR Right 2005   NASAL SINUS SURGERY  1975   PERCUTANEOUS CORONARY STENT INTERVENTION (PCI-S) N/A 05/03/2012   Procedure: PERCUTANEOUS CORONARY STENT INTERVENTION (PCI-S);  Surgeon: Sherren Mocha, MD;  Location: [facility] CATH LAB;  Service: Cardiovascular;  Laterality: N/A;   Promus DES ok for 3T MRI  2008   Family History  Problem Relation Age of Onset   Coronary artery disease Father    Heart attack Father    Rheum arthritis Mother    Cancer Brother        ? 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RETIRED  Tobacco Use   Smoking status: Never   Smokeless tobacco: Never  Vaping Use   Vaping Use: Never used  Substance and Sexual Activity   Alcohol use: Not Currently    Comment: 4-6 drinks/week   Drug use: No   Sexual activity: Yes    Partners: Female  Other Topics Concern   Not on file  Social History Narrative   HSG, 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 He has two daughters, 1 in [place] 1 in [place] (Dec '12) and a son who works with him. Several grandchildren.Reports that he spends a good deal of time at his home in [facility] which he greatly enjoys and his home in the [place]. Enjoys driving his BMW convertible when in the mountains.      ACP - Yes CPR, yes for short-term mechanical ventilation for reversible disease. Recommended [email or web address] for consideration.   Social Determinants of Health   Financial Resource Strain: Low Risk    Difficulty of Paying Living Expenses: Not hard at all  Food Insecurity: No Food Insecurity  Worried About Charity fundraiser in the Last Year: Never true   [place] in the Last Year: Never true  Transportation Needs: No Transportation Needs   Lack of Transportation (Medical): No   Lack of Transportation (Non-Medical): No  Physical Activity: Sufficiently Active   Days of Exercise per Week: 5 days   Minutes of Exercise per Session: 30 min  Stress: No Stress Concern Present   Feeling of Stress : Not at all  Social Connections: Socially Integrated   Frequency of Communication with Friends and Family: More than three times a week   Frequency of Social Gatherings with Friends and Family: More than three times a week   Attends Religious Services: More than 4 times per year   Active Member of [employer] or Organizations: Yes   Attends Music therapist: More than 4 times per year   Marital Status: Married    Tobacco Counseling Counseling given: Not Answered   Clinical Intake:  Pre-visit preparation completed: Yes  Pain : No/denies pain     BMI - recorded: 19.8 Nutritional Status: BMI of 19-24  Normal Nutritional Risks: None Diabetes: No  How often do you need to have someone help you when you read instructions, pamphlets, or other written materials from your doctor or pharmacy?: 1 -  Never What is the last grade level you completed in school?: [employer]; Retired from [employer] (25 years)  Diabetic? no  Interpreter Needed?: No  Information entered by :: Lisette Abu, LPN   Activities of Daily Living In your present state of health, do you have any difficulty performing the following activities: 11/11/2020 10/20/2020  Hearing? Y N  Comment left ear deafness -  Vision? N N  Difficulty concentrating or making decisions? N N  Walking or climbing stairs? N N  Dressing or bathing? N N  Doing errands, shopping? N N  Preparing Food and eating ? N -  Using the Toilet? N -  In the past six months, have you accidently leaked urine? N -  Do you have problems with loss of bowel control? N -  Managing your Medications? N -  Managing your Finances? N -  Housekeeping or managing your Housekeeping? N -  Some recent data might be hidden    Patient Care Team: [name], MD as PCP - General (Internal Medicine) Josue Hector, MD as PCP - Cardiology (Cardiology) Bjorn Loser, MD as Consulting Physician (Urology)  Indicate any recent Medical Services you may have received from other than [facility] providers in the past year (date may be approximate).     Assessment:   This is a routine wellness examination for [facility].  Hearing/Vision screen Hearing Screening - Comments:: Patient has difficulty hearing out of left ear.  No hearing aids. Vision Screening - Comments:: Patient wears glasses and gets eye exam once a year.  Patient sees Dr. Luberta Mutter.  Dietary issues and exercise activities discussed: Current Exercise Habits: Home exercise routine, Type of exercise: walking;Other - see comments (hiking in the mountains), Time (Minutes): 30, Frequency (Times/Week): 5, Weekly Exercise (Minutes/Week): 150, Intensity: Moderate, Exercise limited by: None identified   Goals Addressed   None   Depression Screen PHQ 2/9 Scores 11/11/2020 10/20/2020 09/17/2019 09/13/2018  08/05/2017 08/03/2017 07/25/2016  PHQ - 2 Score 0 0 0 0 0 0 0  PHQ- 9 Score - - - - - 0 -    Fall Risk Fall Risk  11/11/2020 10/20/2020 09/17/2019 09/13/2018 08/05/2017  Falls  in the past year? 0 0 0 1 No  Number falls in past yr: 0 - 0 0 -  Injury with Fall? 0 - 0 0 -  Risk for fall due to : No Fall Risks - No Fall Risks - -  Follow up Falls evaluation completed - Falls evaluation completed;Education provided Falls prevention discussed -    FALL RISK PREVENTION PERTAINING TO THE HOME:  Any stairs in or around the home? Yes  If so, are there any without handrails? No  Home free of loose throw rugs in walkways, pet beds, electrical cords, etc? Yes  Adequate lighting in your home to reduce risk of falls? Yes   ASSISTIVE DEVICES UTILIZED TO PREVENT FALLS:  Life alert? No  Use of a cane, walker or w/c? No  Grab bars in the bathroom? Yes  Shower chair or bench in shower? Yes  Elevated toilet seat or a handicapped toilet? Yes   TIMED UP AND GO:  Was the test performed? No .  Length of time to ambulate 10 feet: 0 sec.   Gait steady and fast without use of assistive device (per patient)  Cognitive Function: MMSE - Mini Mental State Exam 08/03/2017  Orientation to time 5  Orientation to Place 5  Registration 3  Attention/ Calculation 5  Recall 3  Language- name 2 objects 2  Language- repeat 1  Language- follow 3 step command 3  Language- read & follow direction 1  Write a sentence 1  Copy design 1  Total score 30     6CIT Screen 09/17/2019  What Year? 0 points  What month? 0 points  What time? 0 points  Count back from 20 0 points  Months in reverse 0 points  Repeat phrase 0 points  Total Score 0    Immunizations Immunization History  Administered Date(s) Administered   Fluad Quad(high Dose 65+) 02/02/2019   H1N1 04/24/2008   Influenza Whole 02/27/2008, 04/28/2009, 03/01/2011   Influenza, High Dose Seasonal PF 02/06/2013, 02/21/2014, 02/17/2015, 02/10/2017, 03/09/2018    Influenza,inj,Quad PF,6+ Mos 02/16/2016   Influenza-Unspecified 01/16/2012, 01/16/2020   PFIZER(Purple Top)SARS-COV-2 Vaccination 05/25/2019, 06/15/2019, 01/27/2020   Pneumococcal Conjugate-13 06/26/2013, 02/21/2014   Pneumococcal Polysaccharide-23 02/27/2008, 05/22/2019   Pneumococcal-Unspecified 02/06/2013   Tdap 12/15/2011   Zoster Recombinat (Shingrix) 01/24/2018, 05/22/2018   Zoster, Live 04/28/2009, 05/10/2011, 02/17/2015    TDAP status: Up to date  Flu Vaccine status: Up to date  Pneumococcal vaccine status: Up to date  Covid-19 vaccine status: Completed vaccines  Qualifies for Shingles Vaccine? Yes   Zostavax completed Yes   Shingrix Completed?: Yes  Screening Tests Health Maintenance  Topic Date Due   COVID-19 Vaccine (4 - Booster for Pfizer series) 05/28/2020   INFLUENZA VACCINE  12/15/2020   TETANUS/TDAP  12/14/2021   PNA vac Low Risk Adult  Completed   Zoster Vaccines- Shingrix  Completed   HPV VACCINES  Aged Out    Health Maintenance  Health Maintenance Due  Topic Date Due   COVID-19 Vaccine (4 - Booster for Pfizer series) 05/28/2020    Colorectal cancer screening: No longer required.   Lung Cancer Screening: (Low Dose CT Chest recommended if Age 59-80 years, 30 pack-year currently smoking OR have quit w/in 15years.) does not qualify.   Lung Cancer Screening Referral: no  Additional Screening:  Hepatitis C Screening: does not qualify; Completed no  Vision Screening: Recommended annual ophthalmology exams for early detection of glaucoma and other disorders of the eye. Is the patient up to  date with their annual eye exam?  Yes  Who is the provider or what is the name of the office in which the patient attends annual eye exams? Ripley Fraise, MD. If pt is not established with a provider, would they like to be referred to a provider to establish care? No .   Dental Screening: Recommended annual dental exams for proper oral hygiene  Community  Resource Referral / Chronic Care Management: CRR required this visit?  No   CCM required this visit?  No      Plan:     I have personally reviewed and noted the following in the patient's chart:   Medical and social history Use of alcohol, tobacco or illicit drugs  Current medications and supplements including opioid prescriptions. Patient is not currently taking opioid prescriptions. Functional ability and status Nutritional status Physical activity Advanced directives List of other physicians Hospitalizations, surgeries, and ER visits in previous 12 months Vitals Screenings to include cognitive, depression, and falls Referrals and appointments  In addition, I have reviewed and discussed with patient certain preventive protocols, quality metrics, and best practice recommendations. A written personalized care plan for preventive services as well as general preventive health recommendations were provided to patient.     Sheral Flow, LPN   1/61/0960   Nurse Notes:  Patient is cogitatively intact. There were no vitals filed for this visit.

## 2020-11-11 NOTE — Patient Instructions (Signed)
[name] [name] , Thank you for taking time to come for your Medicare Wellness Visit. I appreciate your ongoing commitment to your health goals. Please review the following plan we discussed and let me know if I can assist you in the future.   Screening recommendations/referrals: Colonoscopy: not a candidate due to age Recommended yearly ophthalmology/optometry visit for glaucoma screening and checkup Recommended yearly dental visit for hygiene and checkup  Vaccinations: Influenza vaccine: 01/2020 Pneumococcal vaccine: 02/21/2014, 05/22/2019 Tdap vaccine: 12/15/2011; due every 10 years Shingles vaccine: 01/24/2018, 05/22/2018   Covid-19:05/25/2019, 06/15/2019, 01/27/2020  Advanced directives: Please bring a copy of your health care power of attorney and living will to the office at your convenience.  Conditions/risks identified: To live another year by staying independent, physically and socially active.  Next appointment: Please schedule your next Medicare Wellness Visit with your Nurse Health Advisor in 1 year by calling [phone].  Preventive Care 53 Years and Older, Male Preventive care refers to lifestyle choices and visits with your health care provider that can promote health and wellness. What does preventive care include? A yearly physical exam. This is also called an annual well check. Dental exams once or twice a year. Routine eye exams. Ask your health care provider how often you should have your eyes checked. Personal lifestyle choices, including: Daily care of your teeth and gums. Regular physical activity. Eating a healthy diet. Avoiding tobacco and drug use. Limiting alcohol use. Practicing safe sex. Taking low doses of aspirin every day. Taking vitamin and mineral supplements as recommended by your health care provider. What happens during an annual well check? The services and screenings done by your health care provider during your annual well check will depend on your age,  overall health, lifestyle risk factors, and family history of disease. Counseling  Your health care provider may ask you questions about your: Alcohol use. Tobacco use. Drug use. Emotional well-being. Home and relationship well-being. Sexual activity. Eating habits. History of falls. Memory and ability to understand (cognition). Work and work Statistician. Screening  You may have the following tests or measurements: Height, weight, and BMI. Blood pressure. Lipid and cholesterol levels. These may be checked every 5 years, or more frequently if you are over 36 years old. Skin check. Lung cancer screening. You may have this screening every year starting at age 24 if you have a 30-pack-year history of smoking and currently smoke or have quit within the past 15 years. Fecal occult blood test (FOBT) of the stool. You may have this test every year starting at age 10. Flexible sigmoidoscopy or colonoscopy. You may have a sigmoidoscopy every 5 years or a colonoscopy every 10 years starting at age 38. Prostate cancer screening. Recommendations will vary depending on your family history and other risks. Hepatitis C blood test. Hepatitis B blood test. Sexually transmitted disease (STD) testing. Diabetes screening. This is done by checking your blood sugar (glucose) after you have not eaten for a while (fasting). You may have this done every 1-3 years. Abdominal aortic aneurysm (AAA) screening. You may need this if you are a current or former smoker. Osteoporosis. You may be screened starting at age 92 if you are at high risk. Talk with your health care provider about your test results, treatment options, and if necessary, the need for more tests. Vaccines  Your health care provider may recommend certain vaccines, such as: Influenza vaccine. This is recommended every year. Tetanus, diphtheria, and acellular pertussis (Tdap, Td) vaccine. You may need a Td  booster every 10 years. Zoster vaccine.  You may need this after age 43. Pneumococcal 13-valent conjugate (PCV13) vaccine. One dose is recommended after age 77. Pneumococcal polysaccharide (PPSV23) vaccine. One dose is recommended after age 68. Talk to your health care provider about which screenings and vaccines you need and how often you need them. This information is not intended to replace advice given to you by your health care provider. Make sure you discuss any questions you have with your health care provider. Document Released: 05/30/2015 Document Revised: 01/21/2016 Document Reviewed: 03/04/2015 Elsevier Interactive Patient Education  2017 [place] Prevention in the Home Falls can cause injuries. They can happen to people of all ages. There are many things you can do to make your home safe and to help prevent falls. What can I do on the outside of my home? Regularly fix the edges of walkways and driveways and fix any cracks. Remove anything that might make you trip as you walk through a door, such as a raised step or threshold. Trim any bushes or trees on the path to your home. Use bright outdoor lighting. Clear any walking paths of anything that might make someone trip, such as rocks or tools. Regularly check to see if handrails are loose or broken. Make sure that both sides of any steps have handrails. Any raised decks and porches should have guardrails on the edges. Have any leaves, snow, or ice cleared regularly. Use sand or salt on walking paths during winter. Clean up any spills in your garage right away. This includes oil or grease spills. What can I do in the bathroom? Use night lights. Install grab bars by the toilet and in the tub and shower. Do not use towel bars as grab bars. Use non-skid mats or decals in the tub or shower. If you need to sit down in the shower, use a plastic, non-slip stool. Keep the floor dry. Clean up any water that spills on the floor as soon as it happens. Remove soap  buildup in the tub or shower regularly. Attach bath mats securely with double-sided non-slip rug tape. Do not have throw rugs and other things on the floor that can make you trip. What can I do in the bedroom? Use night lights. Make sure that you have a light by your bed that is easy to reach. Do not use any sheets or blankets that are too big for your bed. They should not hang down onto the floor. Have a firm chair that has side arms. You can use this for support while you get dressed. Do not have throw rugs and other things on the floor that can make you trip. What can I do in the kitchen? Clean up any spills right away. Avoid walking on wet floors. Keep items that you use a lot in easy-to-reach places. If you need to reach something above you, use a strong step stool that has a grab bar. Keep electrical cords out of the way. Do not use floor polish or wax that makes floors slippery. If you must use wax, use non-skid floor wax. Do not have throw rugs and other things on the floor that can make you trip. What can I do with my stairs? Do not leave any items on the stairs. Make sure that there are handrails on both sides of the stairs and use them. Fix handrails that are broken or loose. Make sure that handrails are as long as the stairways. Check any carpeting  to make sure that it is firmly attached to the stairs. Fix any carpet that is loose or worn. Avoid having throw rugs at the top or bottom of the stairs. If you do have throw rugs, attach them to the floor with carpet tape. Make sure that you have a light switch at the top of the stairs and the bottom of the stairs. If you do not have them, ask someone to add them for you. What else can I do to help prevent falls? Wear shoes that: Do not have high heels. Have rubber bottoms. Are comfortable and fit you well. Are closed at the toe. Do not wear sandals. If you use a stepladder: Make sure that it is fully opened. Do not climb a closed  stepladder. Make sure that both sides of the stepladder are locked into place. Ask someone to hold it for you, if possible. Clearly mark and make sure that you can see: Any grab bars or handrails. First and last steps. Where the edge of each step is. Use tools that help you move around (mobility aids) if they are needed. These include: Canes. Walkers. Scooters. Crutches. Turn on the lights when you go into a dark area. Replace any light bulbs as soon as they burn out. Set up your furniture so you have a clear path. Avoid moving your furniture around. If any of your floors are uneven, fix them. If there are any pets around you, be aware of where they are. Review your medicines with your doctor. Some medicines can make you feel dizzy. This can increase your chance of falling. Ask your doctor what other things that you can do to help prevent falls. This information is not intended to replace advice given to you by your health care provider. Make sure you discuss any questions you have with your health care provider. Document Released: 02/27/2009 Document Revised: 10/09/2015 Document Reviewed: 06/07/2014 Elsevier Interactive Patient Education  2017 [employer].

## 2020-11-18 ENCOUNTER — Other Ambulatory Visit: Payer: Self-pay

## 2020-11-18 ENCOUNTER — Ambulatory Visit: Payer: Self-pay

## 2020-11-18 ENCOUNTER — Ambulatory Visit (INDEPENDENT_AMBULATORY_CARE_PROVIDER_SITE_OTHER): Payer: Medicare Other | Admitting: Family Medicine

## 2020-11-18 ENCOUNTER — Encounter: Payer: Self-pay | Admitting: Family Medicine

## 2020-11-18 ENCOUNTER — Ambulatory Visit (INDEPENDENT_AMBULATORY_CARE_PROVIDER_SITE_OTHER): Payer: Medicare Other

## 2020-11-18 VITALS — BP 100/70 | HR 71 | Ht 70.0 in | Wt 141.0 lb

## 2020-11-18 DIAGNOSIS — M7061 Trochanteric bursitis, right hip: Secondary | ICD-10-CM | POA: Diagnosis not present

## 2020-11-18 DIAGNOSIS — M545 Low back pain, unspecified: Secondary | ICD-10-CM | POA: Diagnosis not present

## 2020-11-18 DIAGNOSIS — M25551 Pain in right hip: Secondary | ICD-10-CM | POA: Diagnosis not present

## 2020-11-18 DIAGNOSIS — M25552 Pain in left hip: Secondary | ICD-10-CM

## 2020-11-18 DIAGNOSIS — M47816 Spondylosis without myelopathy or radiculopathy, lumbar region: Secondary | ICD-10-CM | POA: Diagnosis not present

## 2020-11-18 DIAGNOSIS — M7062 Trochanteric bursitis, left hip: Secondary | ICD-10-CM | POA: Diagnosis not present

## 2020-11-18 DIAGNOSIS — M5136 Other intervertebral disc degeneration, lumbar region: Secondary | ICD-10-CM | POA: Diagnosis not present

## 2020-11-18 DIAGNOSIS — R102 Pelvic and perineal pain: Secondary | ICD-10-CM | POA: Diagnosis not present

## 2020-11-18 NOTE — Progress Notes (Deleted)
Patient ID: [name] [name], male   DOB: November 12, 1932, 87 y.o.   MRN: [id number]     87 y.o. history of CAD. Stent to LAD in 2008.  ETT in 2013 abnormal with NSVT. Cath 05/01/12 30% ISR LaD and distal RCA 60-75% with FFR CT .92 Rx medically Myovue done 04/27/17 reviewed and normal with no ischemia study not gated due to PAC;s EF normal echo 06/25/15 55-60% LA only mildly dilated Had another myovue 04/22/20 which was normal with no ischemia EF estimtated 53%  Has Occular migraines Been going on for over 3-4 years  Seen by neurology And MRI/MRA no abnormality and EEG no epileptic foci  01/11/14  1-39% bilateral carotid disease   Has lower lumbar disc issue and bilateral hip pain Has had repeat injection by Dr Tamala Julian Most recently 11/18/20 Lumbar films ordered and considering MRI to r/o radiculopathy   03/2020 Went to [place] to visit his cousin [name] who is a famous Engineer, agricultural in [place] has a hypochondriac for husband Daughter in [place] not working as [place] but he likes her husband better Son works with him and may open a Administrator on [place]  ***  ROS: Denies fever, malais, weight loss, blurry vision, decreased visual acuity, cough, sputum, SOB, hemoptysis, pleuritic pain, palpitaitons, heartburn, abdominal pain, melena, lower extremity edema, claudication, or rash.  All other systems reviewed and negative  General: There were no vitals taken for this visit. Affect appropriate Healthy:  appears stated age 81: normal Neck supple with no adenopathy JVP normal no bruits no thyromegaly Lungs clear with no wheezing and good diaphragmatic motion Heart:  S1/S2 no murmur, no rub, gallop or click PMI normal Abdomen: benighn, BS positve, no tenderness, no AAA no bruit.  No HSM or HJR Distal pulses intact with no bruits No edema Neuro non-focal Skin warm and dry No muscular weakness     Current Outpatient Medications  Medication Sig Dispense Refill   ALPRAZolam (XANAX) 0.25 MG tablet  Take 1 tablet (0.25 mg total) by mouth 2 (two) times daily as needed for anxiety. 60 tablet 3   dutasteride (AVODART) 0.5 MG capsule Take 0.5 mg by mouth daily.     gabapentin (NEURONTIN) 100 MG capsule TAKE 2 CAPSULES(200 MG) BY MOUTH AT BEDTIME 180 capsule 0   hydrocortisone 2.5 % cream Apply 1 application topically 2 (two) times daily.     lisinopril (ZESTRIL) 10 MG tablet TAKE 1 TABLET DAILY 90 tablet 3   metoprolol succinate (TOPROL-XL) 25 MG 24 hr tablet TAKE 1 TABLET BY MOUTH DAILY 90 tablet 1   Polyethylene Glycol 3350 (MIRALAX PO) Take by mouth.     predniSONE (DELTASONE) 50 MG tablet Take 1 tablet (50 mg total) by mouth daily. 5 tablet 0   psyllium (METAMUCIL) 58.6 % packet Take 1 packet by mouth daily.     rosuvastatin (CRESTOR) 40 MG tablet Take 1 tablet (40 mg total) by mouth daily. 15 tablet 0   tamsulosin (FLOMAX) 0.4 MG CAPS capsule Take 0.4 mg by mouth daily.     zolpidem (AMBIEN) 10 MG tablet TAKE 1 TABLET(10 MG) BY MOUTH AT BEDTIME AS NEEDED FOR SLEEP 90 tablet 1   No current facility-administered medications for this visit.    Allergies  Patient has no known allergies.  Electrocardiogram:   06/15/17 SR rate 77 RBBB   Assessment and Plan  CAD: Stable with no angina and good activity level.  Continue medical Rx Stent LAD 2008  Moderate distal  RCA with normal flow wire 04/2012  Normal non ischemic myovue 04/22/20 continue medical Rx   HLD : on generic crestor now  Lab Results  Component Value Date   LDLCALC 46 10/20/2020    GERD:with constipation not helped with Miralax, ducolox or Linzess f/u Dr Satira Anis:  Chronic on ambien f/u Dr Ronnald Ramp   Visual Disturbance:  MRI/MRA normal EEG normal has been seen by neurology   Ortho:  f/u Dr Tamala Julian for bilateral hip pain post injections ***   F/U with me in 6 months   [name]

## 2020-11-18 NOTE — Patient Instructions (Addendum)
Send message in 10-14 days letting me know how you are doing Xray today See me in 5-6 weeks

## 2020-11-18 NOTE — Progress Notes (Signed)
[place] [address] [place] Phone: [phone] Subjective:   I [name] [name] am serving as a Education administrator for Dr. Hulan Saas.  This visit occurred during the SARS-CoV-2 public health emergency.  Safety protocols were in place, including screening questions prior to the visit, additional usage of staff PPE, and extensive cleaning of exam room while observing appropriate contact time as indicated for disinfecting solutions.   I'm seeing this patient by the request  of:  [name] Lima, MD  CC: Right arm and hip follow-up  MCN:[id number]  [name] [name] is a 87 y.o. male coming in with complaint of patient at last follow-up was given injection in the left greater trochanteric area as well as in the right glenohumeral joint.  Patient's ultrasound did show the patient did have significant amount of effusion noted in potential labral pathology.  Patient states his hips are painful today. States he walked fine Saturday but believes he may have over done it. Standing and walking is painful.      Past Medical History:  Diagnosis Date   Anxiety    CAD (coronary artery disease)    a. s/p stent to LAD 2008;  b. abnormal ETT 11/13 with VTach => LHC pLAD 30% ISR, mCFX 30%, dRCA 60-75% which had progressed from previous study in 2010 => FFR of RCA 0.92 => med Rx    Carotid stenosis    dopplers 1/14:  0-39% bilateral ICA stenosis   Colon polyps    Colon polyps    adenomatous   Diverticular disease of colon    Hemorrhoids    external and internal   History of BPH    HTN (hypertension)    Hx of echocardiogram    a. Echo 9/11:  EF 55-60%, normal motion, grade 2 diastolic dysfunction, mild MR, mild LAE, mild RAE, PASP 33.   Hyperlipidemia    IBS (irritable bowel syndrome)    Rhinitis    Stroke, lacunar ([place]) 08/24/2012   Old, 2011 head mri   Past Surgical History:  Procedure Laterality Date   APPENDECTOMY  1956   CORONARY STENT PLACEMENT   2008    Successful PCI of the lesion in the proximal LAD using a Promus    HERNIA REPAIR Right 2005   NASAL SINUS SURGERY  1975   PERCUTANEOUS CORONARY STENT INTERVENTION (PCI-S) N/A 05/03/2012   Procedure: PERCUTANEOUS CORONARY STENT INTERVENTION (PCI-S);  Surgeon: Sherren Mocha, MD;  Location: [facility] CATH LAB;  Service: Cardiovascular;  Laterality: N/A;   Promus DES ok for 3T MRI  2008   Social History   Socioeconomic History   Marital status: Married    Spouse name: Not on file   Number of children: 3   Years of education: 16   Highest education level: Not on file  Occupational History   Occupation: businessman, 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RETIRED  Tobacco Use   Smoking status: Never   Smokeless tobacco: Never  Vaping Use   Vaping Use: Never used  Substance and Sexual Activity   Alcohol use: Not Currently    Comment: 4-6 drinks/week   Drug use: No   Sexual activity: Yes    Partners: Female  Other Topics Concern   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 He has two daughters, 1 in [place] 1 in [place] (  Dec '12) and a son who works with him. Several grandchildren.Reports that he spends a good deal of time at his home in [facility] [place] which he greatly enjoys and his home in the [place]. Enjoys driving his BMW [place] when [place].      ACP - Yes CPR, yes for short-term mechanical ventilation for reversible disease. Recommended [email or web address] for consideration.   Social Determinants of Health   Financial Resource Strain: Low Risk    Difficulty of Paying Living Expenses: Not hard at all  Food Insecurity: No Food Insecurity   Worried About Charity fundraiser in the Last Year: Never true   [place] in the Last Year: Never true  Transportation Needs: No Transportation Needs   Lack of Transportation  (Medical): No   Lack of Transportation (Non-Medical): No  Physical Activity: Sufficiently Active   Days of Exercise per Week: 5 days   Minutes of Exercise per Session: 30 min  Stress: No Stress Concern Present   Feeling of Stress : Not at all  Social Connections: Socially Integrated   Frequency of Communication with Friends and Family: More than three times a week   Frequency of Social Gatherings with Friends and Family: More than three times a week   Attends Religious Services: More than 4 times per year   Active Member of [employer] or Organizations: Yes   Attends Music therapist: More than 4 times per year   Marital Status: Married   No Known Allergies Family History  Problem Relation Age of Onset   Coronary artery disease Father    Heart attack Father    Rheum arthritis Mother    Cancer Brother        ? TYPE   Colon cancer Neg Hx    Stomach cancer Neg Hx    Pancreatic cancer Neg Hx     Current Outpatient Medications (Endocrine & Metabolic):    predniSONE (DELTASONE) 50 MG tablet, Take 1 tablet (50 mg total) by mouth daily.  Current Outpatient Medications (Cardiovascular):    lisinopril (ZESTRIL) 10 MG tablet, TAKE 1 TABLET DAILY   metoprolol succinate (TOPROL-XL) 25 MG 24 hr tablet, TAKE 1 TABLET BY MOUTH DAILY   rosuvastatin (CRESTOR) 40 MG tablet, Take 1 tablet (40 mg total) by mouth daily.     Current Outpatient Medications (Other):    ALPRAZolam (XANAX) 0.25 MG tablet, Take 1 tablet (0.25 mg total) by mouth 2 (two) times daily as needed for anxiety.   dutasteride (AVODART) 0.5 MG capsule, Take 0.5 mg by mouth daily.   gabapentin (NEURONTIN) 100 MG capsule, TAKE 2 CAPSULES(200 MG) BY MOUTH AT BEDTIME   hydrocortisone 2.5 % cream, Apply 1 application topically 2 (two) times daily.   Polyethylene Glycol 3350 (MIRALAX PO), Take by mouth.   psyllium (METAMUCIL) 58.6 % packet, Take 1 packet by mouth daily.   tamsulosin (FLOMAX) 0.4 MG CAPS capsule, Take 0.4 mg  by mouth daily.   zolpidem (AMBIEN) 10 MG tablet, TAKE 1 TABLET(10 MG) BY MOUTH AT BEDTIME AS NEEDED FOR SLEEP   Reviewed prior external information including notes and imaging from  primary care provider As well as notes that were available from care everywhere and other healthcare systems.  Past medical history, social, surgical and family history all reviewed in electronic medical record.  No pertanent information unless stated regarding to the chief complaint.   Review of Systems:  No headache, visual changes, nausea, vomiting, diarrhea, constipation, dizziness, abdominal pain, skin  rash, fevers, chills, night sweats, weight loss, swollen lymph nodes, body aches, joint swelling, chest pain, shortness of breath, mood changes. POSITIVE muscle aches  Objective  Blood pressure 100/70, pulse 71, height 5\' 10"  (1.778 m), weight 141 lb (64 kg), SpO2 96 %.   General: No apparent distress alert and oriented x3 mood and affect normal, dressed appropriately.  HEENT: Pupils equal, extraocular movements intact  Respiratory: Patient's speak in full sentences and does not appear short of breath  Cardiovascular: No lower extremity edema, non tender, no erythema  Gait mild antalgic favoring the left hip. Mild limited range of motion.  Severely tender over the greater trochanteric area.  Negative straight leg test but does have some loss of lordosis of the lumbar spine.  Negative fulcrum test.  4+ out of 5 of the lower extremities strength but seems to be symmetric with the contralateral side.    Procedure: Real-time Ultrasound Guided Injection of left  greater trochanteric bursitis secondary to patient's body habitus Device: GE Logiq Q7  Ultrasound guided injection is preferred based studies that show increased duration, increased effect, greater accuracy, decreased procedural pain, increased response rate, and decreased cost with ultrasound guided versus blind injection.  Verbal informed consent  obtained.  Time-out conducted.  Noted no overlying erythema, induration, or other signs of local infection.  Skin prepped in a sterile fashion.  Local anesthesia: Topical Ethyl chloride.  With sterile technique and under real time ultrasound guidance:  Greater trochanteric area was visualized and patient's bursa was noted. A 21-gauge 2 inch needle was inserted and 42cc of 0.5% Marcaine and 1 cc of Kenalog 40 mg/dL was injected. Pictures taken Completed without difficulty  Pain immediately resolved suggesting accurate placement of the medication.  Advised to call if fevers/chills, erythema, induration, drainage, or persistent bleeding.  Impression: Technically successful ultrasound guided injection.   Impression and Recommendations:     The above documentation has been reviewed and is accurate and complete Lyndal Pulley, DO

## 2020-11-18 NOTE — Assessment & Plan Note (Signed)
Repeat injection given today secondary to the severity of pain.  Discussed with patient about icing regimen and home exercises.  We will get lumbar x-rays to further evaluate for the possibility of radiculopathy.  If this continues to give him difficulties secondary to the severity of the pain as well as the acute decline in patient's activity level I do consider the possibility of an MRI of the lumbar spine and the pelvis.  Patient is in agreement with the plan and will follow up with me otherwise in 5 to 6 weeks.

## 2020-11-20 DIAGNOSIS — R3915 Urgency of urination: Secondary | ICD-10-CM | POA: Diagnosis not present

## 2020-11-25 DIAGNOSIS — L570 Actinic keratosis: Secondary | ICD-10-CM | POA: Diagnosis not present

## 2020-11-25 DIAGNOSIS — L578 Other skin changes due to chronic exposure to nonionizing radiation: Secondary | ICD-10-CM | POA: Diagnosis not present

## 2020-11-25 DIAGNOSIS — Z85828 Personal history of other malignant neoplasm of skin: Secondary | ICD-10-CM | POA: Diagnosis not present

## 2020-11-25 DIAGNOSIS — D485 Neoplasm of uncertain behavior of skin: Secondary | ICD-10-CM | POA: Diagnosis not present

## 2020-11-25 DIAGNOSIS — L309 Dermatitis, unspecified: Secondary | ICD-10-CM | POA: Diagnosis not present

## 2020-11-25 DIAGNOSIS — Z85820 Personal history of malignant melanoma of skin: Secondary | ICD-10-CM | POA: Diagnosis not present

## 2020-11-25 NOTE — Progress Notes (Signed)
Patient ID: [name] [name], male   DOB: May 01, 1933, 87 y.o.   MRN: [id number]     87 y.o. history of CAD. Stent to LAD in 2008.  ETT in 2013 abnormal with NSVT. Cath 05/01/12 30% ISR LaD and distal RCA 60-75% with FFR CT .92 Rx medically Myovue done 04/27/17 reviewed and normal with no ischemia study not gated due to PAC;s EF normal echo 06/25/15 55-60% LA only mildly dilated Had another myovue 04/22/20 which was normal with no ischemia EF estimtated 53%  Has Occular migraines Been going on for over 3-4 years  Seen by neurology And MRI/MRA no abnormality and EEG no epileptic foci  01/11/14  1-39% bilateral carotid disease   Has lower lumbar disc issue and bilateral hip pain Has had repeat injection by Dr Tamala Julian Most recently 11/18/20 Lumbar films ordered and considering MRI to r/o radiculopathy   03/2020 Went to [place] to visit his cousin [name] who is a famous Engineer, agricultural in [place] has a hypochondriac for husband Daughter in [place] not working as [place] but he likes her husband better Son works with him and got married for first time at age 38 to pharmacist at [facility] [employer] [facility]  Had a syncopal episode July  4th. Started with visual changes then BP low  80's with pre syncope  Felt weak for a couple hous. Had two drinks prior Not very hot out Occurred around 6 o'clock No  Chest pain, dyspnea Went to sleep and felt fine with no recurrence   Noted in office HR / BP higher with PAC;s / PVCls   ROS: Denies fever, malais, weight loss, blurry vision, decreased visual acuity, cough, sputum, SOB, hemoptysis, pleuritic pain, palpitaitons, heartburn, abdominal pain, melena, lower extremity edema, claudication, or rash.  All other systems reviewed and negative  General: BP 132/80   Pulse 83   Ht 5\' 10"  (1.778 m)   Wt 63.4 kg   SpO2 96%   BMI 20.06 kg/m  Affect appropriate Healthy:  appears stated age 65: normal Neck supple with no adenopathy JVP normal no bruits no thyromegaly Lungs clear with no wheezing  and good diaphragmatic motion Heart:  S1/S2 no murmur, no rub, gallop or click PMI normal Abdomen: benighn, BS positve, no tenderness, no AAA no bruit.  No HSM or HJR Distal pulses intact with no bruits No edema Neuro non-focal Skin warm and dry No muscular weakness     Current Outpatient Medications  Medication Sig Dispense Refill   ALPRAZolam (XANAX) 0.25 MG tablet Take 1 tablet (0.25 mg total) by mouth 2 (two) times daily as needed for anxiety. 60 tablet 3   dutasteride (AVODART) 0.5 MG capsule Take 0.5 mg by mouth daily.     gabapentin (NEURONTIN) 100 MG capsule TAKE 2 CAPSULES(200 MG) BY MOUTH AT BEDTIME 180 capsule 0   hydrocortisone 2.5 % cream Apply 1 application topically 2 (two) times daily.     lisinopril (ZESTRIL) 10 MG tablet TAKE 1 TABLET DAILY 90 tablet 3   metoprolol succinate (TOPROL-XL) 25 MG 24 hr tablet TAKE 1 TABLET BY MOUTH DAILY 90 tablet 1   psyllium (METAMUCIL) 58.6 % packet Take 1 packet by mouth daily.     rosuvastatin (CRESTOR) 40 MG tablet Take 1 tablet (40 mg total) by mouth daily. 15 tablet 0   tamsulosin (FLOMAX) 0.4 MG CAPS capsule Take 0.4 mg by mouth daily.     zolpidem (AMBIEN) 10 MG tablet TAKE 1 TABLET(10 MG) BY MOUTH AT BEDTIME AS NEEDED  FOR SLEEP 90 tablet 1   Polyethylene Glycol 3350 (MIRALAX PO) Take by mouth. (Patient not taking: Reported on 12/01/2020)     predniSONE (DELTASONE) 50 MG tablet Take 1 tablet (50 mg total) by mouth daily. (Patient not taking: Reported on 12/01/2020) 5 tablet 0   No current facility-administered medications for this visit.    Allergies  Patient has no known allergies.  Electrocardiogram:   06/15/17 SR rate 77 RBBB  12/01/2020 SR PAC/PVC RBBB   Assessment and Plan  CAD: Stable with no angina and good activity level.  Continue medical Rx Stent LAD 2008  Moderate distal RCA with normal flow wire 04/2012  Normal non ischemic myovue 04/22/20 continue medical Rx   HLD : on generic crestor now  Lab Results   Component Value Date   LDLCALC 46 10/20/2020    GERD:with constipation not helped with Miralax, ducolox or Linzess f/u Dr Satira Anis:  Chronic on ambien f/u Dr Ronnald Ramp   Visual Disturbance:  MRI/MRA normal EEG normal has been seen by neurology   Ortho:  f/u Dr Tamala Julian for bilateral hip pain post injections improved   Syncope:  ? Arrhythmic increase Toprol to 50 mg daily Check labs including lytes/Mg/TSH 14 day monitor Just had low risk myovue in September with no chest pain no need to repeat ECG with higher HR and ectopy than usual for him    F/U with me in 6 months   [name]

## 2020-11-26 ENCOUNTER — Ambulatory Visit: Payer: Medicare Other | Admitting: Cardiovascular Disease

## 2020-11-28 DIAGNOSIS — C44729 Squamous cell carcinoma of skin of left lower limb, including hip: Secondary | ICD-10-CM | POA: Diagnosis not present

## 2020-11-28 DIAGNOSIS — Z85828 Personal history of other malignant neoplasm of skin: Secondary | ICD-10-CM | POA: Diagnosis not present

## 2020-12-01 ENCOUNTER — Other Ambulatory Visit: Payer: Self-pay

## 2020-12-01 ENCOUNTER — Encounter: Payer: Self-pay | Admitting: Cardiovascular Disease

## 2020-12-01 ENCOUNTER — Ambulatory Visit (INDEPENDENT_AMBULATORY_CARE_PROVIDER_SITE_OTHER): Payer: Medicare Other

## 2020-12-01 ENCOUNTER — Ambulatory Visit (INDEPENDENT_AMBULATORY_CARE_PROVIDER_SITE_OTHER): Payer: Medicare Other | Admitting: Cardiovascular Disease

## 2020-12-01 VITALS — BP 132/80 | HR 83 | Ht 70.0 in | Wt 139.8 lb

## 2020-12-01 DIAGNOSIS — R002 Palpitations: Secondary | ICD-10-CM

## 2020-12-01 DIAGNOSIS — I2510 Atherosclerotic heart disease of native coronary artery without angina pectoris: Secondary | ICD-10-CM

## 2020-12-01 DIAGNOSIS — I493 Ventricular premature depolarization: Secondary | ICD-10-CM | POA: Diagnosis not present

## 2020-12-01 DIAGNOSIS — R55 Syncope and collapse: Secondary | ICD-10-CM

## 2020-12-01 LAB — BASIC METABOLIC PANEL
BUN/Creatinine Ratio: 13 (ref 10–24)
BUN: 14 mg/dL (ref 8–27)
CO2: 23 mmol/L (ref 20–29)
Calcium: 10.1 mg/dL (ref 8.6–10.2)
Chloride: 103 mmol/L (ref 96–106)
Creatinine, Ser: 1.12 mg/dL (ref 0.76–1.27)
Glucose: 95 mg/dL (ref 65–99)
Potassium: 4.9 mmol/L (ref 3.5–5.2)
Sodium: 141 mmol/L (ref 134–144)
eGFR: 64 mL/min/{1.73_m2} (ref >59)

## 2020-12-01 LAB — TSH: TSH: 2.06 u[IU]/mL (ref 0.450–4.500)

## 2020-12-01 LAB — MAGNESIUM: Magnesium: 2 mg/dL (ref 1.6–2.3)

## 2020-12-01 MED ORDER — METOPROLOL SUCCINATE ER 50 MG PO TB24: 50.0000 mg | ORAL_TABLET | Freq: Every day | ORAL | 3 refills | Status: DC

## 2020-12-01 NOTE — Addendum Note (Signed)
Addended by: Aris Georgia, Josafat Enrico L on: 12/01/2020 11:07 AM   Modules accepted: Orders

## 2020-12-01 NOTE — Patient Instructions (Addendum)
Medication Instructions:  Your physician has recommended you make the following change in your medication:  1-Increase Metoprolol 50 mg by mouth daily.  *If you need a refill on your cardiac medications before your next appointment, please call your pharmacy*  Lab Work: Your physician recommends that you have lab work today- BMET, Mg and TSH  If you have labs (blood work) drawn today and your tests are completely normal, you will receive your results only by: MyChart Message (if you have MyChart) OR A paper copy in the mail If you have any lab test that is abnormal or we need to change your treatment, we will call you to review the results.  Testing/Procedures: [place]- Long Term Monitor Instructions  Your physician has requested you wear a ZIO patch monitor for 14 days.  This is a single patch monitor. [employer] supplies one patch monitor per enrollment. Additional stickers are not available. Please do not apply patch if you will be having a Nuclear Stress Test,  Echocardiogram, Cardiac CT, MRI, or Chest Xray during the period you would be wearing the  monitor. The patch cannot be worn during these tests. You cannot remove and re-apply the  ZIO XT patch monitor.  Your ZIO patch monitor will be mailed 3 day USPS to your address on file. It may take 3-5 days  to receive your monitor after you have been enrolled.  Once you have received your monitor, please review the enclosed instructions. Your monitor  has already been registered assigning a specific monitor serial # to you.  Billing and Patient Assistance Program Information  We have supplied [employer] with any of your insurance information on file for billing purposes. [employer] offers a sliding scale Patient Assistance Program for patients that do not have  insurance, or whose insurance does not completely cover the cost of the ZIO monitor.  You must apply for the Patient Assistance Program to qualify for this discounted rate.  To  apply, please call [employer] at [phone], select option 4, select option 2, ask to apply for  Patient Assistance Program. Theodore Demark will ask your household income, and how many people  are in your household. They will quote your out-of-pocket cost based on that information.  [employer] will also be able to set up a 36-month, interest-free payment plan if needed.  Applying the monitor   Shave hair from upper left chest.  Hold abrader disc by orange tab. Rub abrader in 40 strokes over the upper left chest as  indicated in your monitor instructions.  Clean area with 4 enclosed alcohol pads. Let dry.  Apply patch as indicated in monitor instructions. Patch will be placed under collarbone on left  side of chest with arrow pointing upward.  Rub patch adhesive wings for 2 minutes. Remove white label marked "1". Remove the white  label marked "2". Rub patch adhesive wings for 2 additional minutes.  While looking in a mirror, press and release button in center of patch. A small green light will  flash 3-4 times. This will be your only indicator that the monitor has been turned on.  Do not shower for the first 24 hours. You may shower after the first 24 hours.  Press the button if you feel a symptom. You will hear a small click. Record Date, Time and  Symptom in the Patient Logbook.  When you are ready to remove the patch, follow instructions on the last 2 pages of Patient  Logbook. Stick patch monitor onto the last page  of Patient Logbook.  Place Patient Logbook in the blue and white box. Use locking tab on box and tape box closed  securely. The blue and white box has prepaid postage on it. Please place it in the mailbox as  soon as possible. Your physician should have your test results approximately 7 days after the  monitor has been mailed back to [facility].  Call [place] at [phone] if you have questions regarding  your ZIO XT patch monitor. Call them immediately if  you see an orange light blinking on your  monitor.  If your monitor falls off in less than 4 days, contact our Monitor department at [phone].  If your monitor becomes loose or falls off after 4 days call [employer] at [phone] for  suggestions on securing your monitor  Follow-Up: At [facility], you and your health needs are our priority.  As part of our continuing mission to provide you with exceptional heart care, we have created designated Provider Care Teams.  These Care Teams include your primary Cardiologist (physician) and Advanced Practice Providers (APPs -  Physician Assistants and Nurse Practitioners) who all work together to provide you with the care you need, when you need it.  We recommend signing up for the patient portal called "MyChart".  Sign up information is provided on this After Visit Summary.  MyChart is used to connect with patients for Virtual Visits (Telemedicine).  Patients are able to view lab/test results, encounter notes, upcoming appointments, etc.  Non-urgent messages can be sent to your provider as well.   To learn more about what you can do with MyChart, go to [email or web address].    Your next appointment:   4 to 6 weeks  The format for your next appointment:   In Person  Provider:   You may see [name], MD or one of the following Advanced Practice Providers on your designated Care Team:   Cecilie Kicks, NP

## 2020-12-01 NOTE — Progress Notes (Unsigned)
Patient enrolled for [employer] to mail a 14 day ZIO XT monitor to address on file. 

## 2020-12-02 ENCOUNTER — Telehealth: Payer: Self-pay | Admitting: Cardiovascular Disease

## 2020-12-02 NOTE — Telephone Encounter (Signed)
[name] Hector, MD  [name], RN Labs ok don't explain ectopy   Patient aware of results.

## 2020-12-02 NOTE — Telephone Encounter (Signed)
Pt is returning call.  

## 2020-12-07 DIAGNOSIS — I493 Ventricular premature depolarization: Secondary | ICD-10-CM | POA: Diagnosis not present

## 2020-12-07 DIAGNOSIS — R55 Syncope and collapse: Secondary | ICD-10-CM

## 2020-12-07 DIAGNOSIS — I2510 Atherosclerotic heart disease of native coronary artery without angina pectoris: Secondary | ICD-10-CM | POA: Diagnosis not present

## 2020-12-22 ENCOUNTER — Encounter: Payer: Self-pay | Admitting: Family Medicine

## 2020-12-22 ENCOUNTER — Ambulatory Visit (INDEPENDENT_AMBULATORY_CARE_PROVIDER_SITE_OTHER): Payer: Medicare Other | Admitting: Family Medicine

## 2020-12-22 ENCOUNTER — Ambulatory Visit (INDEPENDENT_AMBULATORY_CARE_PROVIDER_SITE_OTHER): Payer: Medicare Other

## 2020-12-22 ENCOUNTER — Other Ambulatory Visit: Payer: Self-pay

## 2020-12-22 ENCOUNTER — Ambulatory Visit: Payer: Self-pay

## 2020-12-22 VITALS — BP 142/72 | HR 52 | Ht 70.0 in | Wt 142.0 lb

## 2020-12-22 DIAGNOSIS — M25552 Pain in left hip: Secondary | ICD-10-CM

## 2020-12-22 DIAGNOSIS — M25511 Pain in right shoulder: Secondary | ICD-10-CM

## 2020-12-22 DIAGNOSIS — G8929 Other chronic pain: Secondary | ICD-10-CM

## 2020-12-22 DIAGNOSIS — M7061 Trochanteric bursitis, right hip: Secondary | ICD-10-CM

## 2020-12-22 DIAGNOSIS — M47812 Spondylosis without myelopathy or radiculopathy, cervical region: Secondary | ICD-10-CM | POA: Diagnosis not present

## 2020-12-22 DIAGNOSIS — M7062 Trochanteric bursitis, left hip: Secondary | ICD-10-CM

## 2020-12-22 DIAGNOSIS — M545 Low back pain, unspecified: Secondary | ICD-10-CM

## 2020-12-22 DIAGNOSIS — M25551 Pain in right hip: Secondary | ICD-10-CM

## 2020-12-22 DIAGNOSIS — I2510 Atherosclerotic heart disease of native coronary artery without angina pectoris: Secondary | ICD-10-CM | POA: Diagnosis not present

## 2020-12-22 MED ORDER — METHYLPREDNISOLONE ACETATE 40 MG/ML IJ SUSP
40.0000 mg | Freq: Once | INTRAMUSCULAR | Status: AC
  Administered 2020-12-22: 40 mg via INTRAMUSCULAR

## 2020-12-22 MED ORDER — KETOROLAC TROMETHAMINE 30 MG/ML IJ SOLN
30.0000 mg | Freq: Once | INTRAMUSCULAR | Status: AC
  Administered 2020-12-22: 30 mg via INTRAMUSCULAR

## 2020-12-22 MED ORDER — TRAZODONE HCL 50 MG PO TABS: ORAL_TABLET | ORAL | 1 refills | Status: DC

## 2020-12-22 NOTE — Assessment & Plan Note (Addendum)
Patient continues to have left-sided difficulty.  Patient is not responding to the injections as well as he was doing previously.  Now less than 1 month started having increasing discomfort again.  We will get MRI of the lumbar spine and the pelvis today.  Depending on findings he could be a candidate for possible nerve root injections or epidurals.  Patient would not want any surgical intervention and I do not think it would be necessary.  Follow-up with me again after imaging to discuss further.  Toradol and Depo-Medrol given today.  Patient does describe some difficulty with sleep so given a low-dose of trazodone the potential trial.  Discussed taking half a tab first.  Patient is in agreement with the plan.  Is taking gabapentin with no significant benefit.

## 2020-12-22 NOTE — Progress Notes (Signed)
[address] [place] Phone: [phone] Subjective:   [name], am serving as a scribe for Dr. Hulan Saas.  This visit occurred during the SARS-CoV-2 public health emergency.  Safety protocols were in place, including screening questions prior to the visit, additional usage of staff PPE, and extensive cleaning of exam room while observing appropriate contact time as indicated for disinfecting solutions.   I'm seeing this patient by the request  of:  [name], MD  CC: Left hip and back pain  [id number]  11/18/2020 Repeat injection given today secondary to the severity of pain.  Discussed with patient about icing regimen and home exercises.  We will get lumbar x-rays to further evaluate for the possibility of radiculopathy.  If this continues to give him difficulties secondary to the severity of the pain as well as the acute decline in patient's activity level I do consider the possibility of an MRI of the lumbar spine and the pelvis.  Patient is in agreement with the plan and will follow up with me otherwise in 5 to 6 weeks.  Update 12/22/2020 EASTYN EVANOFF is a 87 y.o. male coming in with complaint of B hip pain. Patient states that his hip pain is 3/10. Injections help pain. Takes '600mg'$  of Tylenol at night.   R shoulder pain is present at night when he sleeps.      Past Medical History:  Diagnosis Date   Anxiety    CAD (coronary artery disease)    a. s/p stent to LAD 2008;  b. abnormal ETT 11/13 with VTach => LHC pLAD 30% ISR, mCFX 30%, dRCA 60-75% which had progressed from previous study in 2010 => FFR of RCA 0.92 => med Rx    Carotid stenosis    dopplers 1/14:  0-39% bilateral ICA stenosis   Colon polyps    Colon polyps    adenomatous   Diverticular disease of colon    Hemorrhoids    external and internal   History of BPH    HTN (hypertension)    Hx of echocardiogram    a. Echo 9/11:  EF 55-60%,  normal motion, grade 2 diastolic dysfunction, mild MR, mild LAE, mild RAE, PASP 33.   Hyperlipidemia    IBS (irritable bowel syndrome)    Rhinitis    Stroke, lacunar ([place]) 08/24/2012   Old, 2011 head mri   Past Surgical History:  Procedure Laterality Date   APPENDECTOMY  1956   CORONARY STENT PLACEMENT  2008    Successful PCI of the lesion in the proximal LAD using a Promus    HERNIA REPAIR Right 2005   NASAL SINUS SURGERY  1975   PERCUTANEOUS CORONARY STENT INTERVENTION (PCI-S) N/A 05/03/2012   Procedure: PERCUTANEOUS CORONARY STENT INTERVENTION (PCI-S);  Surgeon: Sherren Mocha, MD;  Location: [facility] CATH LAB;  Service: Cardiovascular;  Laterality: N/A;   Promus DES ok for 3T MRI  2008   Social History   Socioeconomic History   Marital status: Married    Spouse name: Not on file   Number of children: 3   Years of education: 16   Highest education level: Not on file  Occupational History   Occupation: businessman, 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RETIRED  Tobacco Use   Smoking status: Never   Smokeless tobacco: Never  Vaping Use   Vaping Use: Never used  Substance and Sexual Activity   Alcohol use: Not Currently  Comment: 4-6 drinks/week   Drug use: No   Sexual activity: Yes    Partners: Female  Other Topics Concern   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 He has two daughters, 1 in [place] 1 in [place] (Dec '12) and a son who works with him. Several grandchildren.Reports that he spends a good deal of time at his home in [facility] which he greatly enjoys and his home in the [place]. Enjoys driving his BMW convertible when in the mountains.      ACP - Yes CPR, yes for short-term mechanical ventilation for reversible disease. Recommended [email or web address] for consideration.   Social Determinants of Health   Financial  Resource Strain: Low Risk    Difficulty of Paying Living Expenses: Not hard at all  Food Insecurity: No Food Insecurity   Worried About Charity fundraiser in the Last Year: Never true   [place] in the Last Year: Never true  Transportation Needs: No Transportation Needs   Lack of Transportation (Medical): No   Lack of Transportation (Non-Medical): No  Physical Activity: Sufficiently Active   Days of Exercise per Week: 5 days   Minutes of Exercise per Session: 30 min  Stress: No Stress Concern Present   Feeling of Stress : Not at all  Social Connections: Socially Integrated   Frequency of Communication with Friends and Family: More than three times a week   Frequency of Social Gatherings with Friends and Family: More than three times a week   Attends Religious Services: More than 4 times per year   Active Member of [employer] or Organizations: Yes   Attends Music therapist: More than 4 times per year   Marital Status: Married   No Known Allergies Family History  Problem Relation Age of Onset   Coronary artery disease Father    Heart attack Father    Rheum arthritis Mother    Cancer Brother        ? TYPE   Colon cancer Neg Hx    Stomach cancer Neg Hx    Pancreatic cancer Neg Hx     Current Outpatient Medications (Endocrine & Metabolic):    predniSONE (DELTASONE) 50 MG tablet, Take 1 tablet (50 mg total) by mouth daily.  Current Outpatient Medications (Cardiovascular):    lisinopril (ZESTRIL) 10 MG tablet, TAKE 1 TABLET DAILY   metoprolol succinate (TOPROL-XL) 50 MG 24 hr tablet, Take 1 tablet (50 mg total) by mouth daily.   rosuvastatin (CRESTOR) 40 MG tablet, Take 1 tablet (40 mg total) by mouth daily.     Current Outpatient Medications (Other):    ALPRAZolam (XANAX) 0.25 MG tablet, Take 1 tablet (0.25 mg total) by mouth 2 (two) times daily as needed for anxiety.   dutasteride (AVODART) 0.5 MG capsule, Take 0.5 mg by mouth daily.   gabapentin  (NEURONTIN) 100 MG capsule, TAKE 2 CAPSULES(200 MG) BY MOUTH AT BEDTIME   hydrocortisone 2.5 % cream, Apply 1 application topically 2 (two) times daily.   Polyethylene Glycol 3350 (MIRALAX PO), Take by mouth.   psyllium (METAMUCIL) 58.6 % packet, Take 1 packet by mouth daily.   tamsulosin (FLOMAX) 0.4 MG CAPS capsule, Take 0.4 mg by mouth daily.   traZODone (DESYREL) 50 MG tablet, Take 25-'50MG'$  daily at bedtime   zolpidem (AMBIEN) 10 MG tablet, TAKE 1 TABLET(10 MG) BY MOUTH  AT BEDTIME AS NEEDED FOR SLEEP   Reviewed prior external information including notes and imaging from  primary care provider As well as notes that were available from care everywhere and other healthcare systems.  Past medical history, social, surgical and family history all reviewed in electronic medical record.  No pertanent information unless stated regarding to the chief complaint.   Review of Systems:  No headache, visual changes, nausea, vomiting, diarrhea, constipation, dizziness, abdominal pain, skin rash, fevers, chills, night sweats, weight loss, swollen lymph nodes, body aches, joint swelling, chest pain, shortness of breath, mood changes. POSITIVE muscle aches  Objective  Blood pressure (!) 142/72, pulse (!) 52, height '5\' 10"'$  (1.778 m), weight 142 lb (64.4 kg), SpO2 92 %.   General: No apparent distress alert and oriented x3 mood and affect normal, dressed appropriately.  HEENT: Pupils equal, extraocular movements intact  Respiratory: Patient's speak in full sentences and does not appear short of breath  Cardiovascular: No lower extremity edema, non tender, no erythema  Gait normal with good balance and coordination.  MSK: Mild arthritic changes of multiple joints.  Continues to have discomfort over the greater trochanteric area somewhat.  Patient does have tightness in the [place] area as well.  Mild pain in the lumbar spine but very minimal.  Patient does have tightness with straight leg test.    Impression  and Recommendations:    The above documentation has been reviewed and is accurate and complete Lyndal Pulley, DO

## 2020-12-22 NOTE — Patient Instructions (Addendum)
Xray today of shoulder and neck  MRI lumbar and Pelvis Injection given today  Trazodone 25-'50mg'$  at night  Will be in touch once results of Mri's are back  See me again in 4 weeks

## 2020-12-29 DIAGNOSIS — R55 Syncope and collapse: Secondary | ICD-10-CM | POA: Diagnosis not present

## 2020-12-29 DIAGNOSIS — I2510 Atherosclerotic heart disease of native coronary artery without angina pectoris: Secondary | ICD-10-CM | POA: Diagnosis not present

## 2020-12-29 DIAGNOSIS — I493 Ventricular premature depolarization: Secondary | ICD-10-CM | POA: Diagnosis not present

## 2020-12-29 NOTE — Progress Notes (Signed)
Patient ID: [name] [name], male   DOB: 1933/03/30, 87 y.o.   MRN: [id number]     87 y.o. history of CAD. Stent to LAD in 2008.  ETT in 2013 abnormal with NSVT. Cath 05/01/12 30% ISR LaD and distal RCA 60-75% with FFR CT .92 Rx medically Myovue done 04/27/17 reviewed and normal with no ischemia study not gated due to PAC;s EF normal echo 06/25/15 55-60% LA only mildly dilated Had another myovue 04/22/20 which was normal with no ischemia EF estimtated 53%  Has Occular migraines Been going on for over 3-4 years  Seen by neurology And MRI/MRA no abnormality and EEG no epileptic foci  01/11/14  1-39% bilateral carotid disease   Has lower lumbar disc issue and bilateral hip pain Has had repeat injection by Dr Tamala Julian Most recently 11/18/20 Lumbar films ordered and considering MRI to r/o radiculopathy   03/2020 Went to [place] to visit his cousin [name] who is a famous Engineer, agricultural in [place] has a hypochondriac for husband Daughter in [place] not working as [place] but he likes her husband better Son works with him and got married for first time at age 36 to pharmacist at [facility]  Had a syncopal episode July  4th. 2022 Started with visual changes then BP low  80's with pre syncope  Felt weak for a couple hous. Had two drinks prior Not very hot out Occurred around 6 o'clock No  Chest pain, dyspnea Went to sleep and felt fine with no recurrence   Monitor:  showed no PAF but 8% burden atrial ectopy with self limited runs of SVT   Continues to have hip pain left > right MRI ordered by Dr Tamala Julian with rehab  Buying a tract of land on [name] to build a [employer]   ROS: Denies fever, malais, weight loss, blurry vision, decreased visual acuity, cough, sputum, SOB, hemoptysis, pleuritic pain, palpitaitons, heartburn, abdominal pain, melena, lower extremity edema, claudication, or rash.  All other systems reviewed and negative  General: There were no vitals taken for this visit. Affect  appropriate Healthy:  appears stated age 38: normal Neck supple with no adenopathy JVP normal no bruits no thyromegaly Lungs clear with no wheezing and good diaphragmatic motion Heart:  S1/S2 no murmur, no rub, gallop or click PMI normal Abdomen: benighn, BS positve, no tenderness, no AAA no bruit.  No HSM or HJR Distal pulses intact with no bruits No edema Neuro non-focal Skin warm and dry No muscular weakness     Current Outpatient Medications  Medication Sig Dispense Refill   ALPRAZolam (XANAX) 0.25 MG tablet Take 1 tablet (0.25 mg total) by mouth 2 (two) times daily as needed for anxiety. 60 tablet 3   dutasteride (AVODART) 0.5 MG capsule Take 0.5 mg by mouth daily.     gabapentin (NEURONTIN) 100 MG capsule TAKE 2 CAPSULES(200 MG) BY MOUTH AT BEDTIME 180 capsule 0   hydrocortisone 2.5 % cream Apply 1 application topically 2 (two) times daily.     lisinopril (ZESTRIL) 10 MG tablet TAKE 1 TABLET DAILY 90 tablet 3   metoprolol succinate (TOPROL-XL) 50 MG 24 hr tablet Take 1 tablet (50 mg total) by mouth daily. 90 tablet 3   Polyethylene Glycol 3350 (MIRALAX PO) Take by mouth.     predniSONE (DELTASONE) 50 MG tablet Take 1 tablet (50 mg total) by mouth daily. 5 tablet 0   psyllium (METAMUCIL) 58.6 % packet Take 1 packet by mouth daily.  rosuvastatin (CRESTOR) 40 MG tablet Take 1 tablet (40 mg total) by mouth daily. 15 tablet 0   tamsulosin (FLOMAX) 0.4 MG CAPS capsule Take 0.4 mg by mouth daily.     traZODone (DESYREL) 50 MG tablet Take 25-'50MG'$  daily at bedtime 30 tablet 1   zolpidem (AMBIEN) 10 MG tablet TAKE 1 TABLET(10 MG) BY MOUTH AT BEDTIME AS NEEDED FOR SLEEP 90 tablet 1   No current facility-administered medications for this visit.    Allergies  Patient has no known allergies.  Electrocardiogram:   06/15/17 SR rate 77 RBBB  12/29/2020 SR PAC/PVC RBBB   Assessment and Plan  CAD: Stable with no angina and good activity level.  Continue medical Rx Stent LAD 2008   Moderate distal RCA with normal flow wire 04/2012  Normal non ischemic myovue 04/22/20 continue medical Rx   HLD : on generic crestor now  Lab Results  Component Value Date   LDLCALC 46 10/20/2020    GERD:with constipation not helped with Miralax, ducolox or Linzess f/u Dr Satira Anis:  Chronic on ambien f/u Dr Ronnald Ramp   Visual Disturbance:  MRI/MRA normal EEG normal has been seen by neurology   Ortho:  f/u Dr Tamala Julian for bilateral hip pain post injections MRI pending   Syncope:   Normal myovue 04/22/20 Labs ok Monitor with mostly atrial ectopy continue beta blocker    F/U with me in 6 months   [name]

## 2020-12-30 ENCOUNTER — Ambulatory Visit (INDEPENDENT_AMBULATORY_CARE_PROVIDER_SITE_OTHER): Payer: Medicare Other | Admitting: Cardiovascular Disease

## 2020-12-30 ENCOUNTER — Encounter: Payer: Self-pay | Admitting: Cardiovascular Disease

## 2020-12-30 ENCOUNTER — Other Ambulatory Visit: Payer: Self-pay

## 2020-12-30 VITALS — BP 128/70 | HR 82 | Ht 70.0 in | Wt 140.0 lb

## 2020-12-30 DIAGNOSIS — I491 Atrial premature depolarization: Secondary | ICD-10-CM

## 2020-12-30 DIAGNOSIS — I2510 Atherosclerotic heart disease of native coronary artery without angina pectoris: Secondary | ICD-10-CM

## 2020-12-30 DIAGNOSIS — E782 Mixed hyperlipidemia: Secondary | ICD-10-CM | POA: Diagnosis not present

## 2020-12-30 DIAGNOSIS — R3915 Urgency of urination: Secondary | ICD-10-CM | POA: Diagnosis not present

## 2020-12-30 NOTE — Patient Instructions (Signed)
Medication Instructions:  *If you need a refill on your cardiac medications before your next appointment, please call your pharmacy*  Lab Work: If you have labs (blood work) drawn today and your tests are completely normal, you will receive your results only by: MyChart Message (if you have MyChart) OR A paper copy in the mail If you have any lab test that is abnormal or we need to change your treatment, we will call you to review the results.  Testing/Procedures: None ordered today.  Follow-Up: At [facility], you and your health needs are our priority.  As part of our continuing mission to provide you with exceptional heart care, we have created designated Provider Care Teams.  These Care Teams include your primary Cardiologist (physician) and Advanced Practice Providers (APPs -  Physician Assistants and Nurse Practitioners) who all work together to provide you with the care you need, when you need it.  We recommend signing up for the patient portal called "MyChart".  Sign up information is provided on this After Visit Summary.  MyChart is used to connect with patients for Virtual Visits (Telemedicine).  Patients are able to view lab/test results, encounter notes, upcoming appointments, etc.  Non-urgent messages can be sent to your provider as well.   To learn more about what you can do with MyChart, go to [email or web address].    Your next appointment:   6 month(s)  The format for your next appointment:   In Person  Provider:   You may see Peter Nishan, MD or one of the following Advanced Practice Providers on your designated Care Team:   Laura Ingold, NP  

## 2021-01-20 ENCOUNTER — Other Ambulatory Visit: Payer: Medicare Other

## 2021-01-20 ENCOUNTER — Ambulatory Visit: Payer: Medicare Other | Admitting: Family Medicine

## 2021-01-21 ENCOUNTER — Inpatient Hospital Stay: Admission: RE | Admit: 2021-01-21 | Payer: Medicare Other | Source: Ambulatory Visit

## 2021-01-21 ENCOUNTER — Other Ambulatory Visit: Payer: Medicare Other

## 2021-01-23 ENCOUNTER — Ambulatory Visit: Payer: Medicare Other | Admitting: Family Medicine

## 2021-01-26 DIAGNOSIS — Z85828 Personal history of other malignant neoplasm of skin: Secondary | ICD-10-CM | POA: Diagnosis not present

## 2021-01-26 DIAGNOSIS — D045 Carcinoma in situ of skin of trunk: Secondary | ICD-10-CM | POA: Diagnosis not present

## 2021-01-26 DIAGNOSIS — Z85820 Personal history of malignant melanoma of skin: Secondary | ICD-10-CM | POA: Diagnosis not present

## 2021-01-26 DIAGNOSIS — L821 Other seborrheic keratosis: Secondary | ICD-10-CM | POA: Diagnosis not present

## 2021-01-26 DIAGNOSIS — L814 Other melanin hyperpigmentation: Secondary | ICD-10-CM | POA: Diagnosis not present

## 2021-01-26 DIAGNOSIS — L570 Actinic keratosis: Secondary | ICD-10-CM | POA: Diagnosis not present

## 2021-01-27 DIAGNOSIS — R3915 Urgency of urination: Secondary | ICD-10-CM | POA: Diagnosis not present

## 2021-01-29 NOTE — Progress Notes (Signed)
[name] [name] Phone: [phone] Subjective:    I'm seeing this patient by the request  of:  [name], MD  CC: Left leg pain  [id number]  12/22/2020 Patient continues to have left-sided difficulty.  Patient is not responding to the injections as well as he was doing previously.  Now less than 1 month started having increasing discomfort again.  We will get MRI of the lumbar spine and the pelvis today.  Depending on findings he could be a candidate for possible nerve root injections or epidurals.  Patient would not want any surgical intervention and I do not think it would be necessary.  Follow-up with me again after imaging to discuss further.  Toradol and Depo-Medrol given today.  Patient does describe some difficulty with sleep so given a low-dose of trazodone the potential trial.  Discussed taking half a tab first.  Patient is in agreement with the plan.  Is taking gabapentin with no significant benefit.  Updated 02/03/2021 [name] [name] is a 87 y.o. male coming in with complaint of bilateral hip pain and states that he has been feeling fine since his last visit. Patient had Pelvis and lumbar spine MRI on 02/01/2021 That showed the patient did have a gluteal tear noted.  Patient states that the pain was only intermittent.  States that it needed mostly activities without any difficult  Xray cervical IMPRESSION: 12/22/2020 Multilevel degenerative change relatively stable from the prior exam. Xray shoulder (-)       Past Medical History:  Diagnosis Date   Anxiety    CAD (coronary artery disease)    a. s/p stent to LAD 2008;  b. abnormal ETT 11/13 with VTach => LHC pLAD 30% ISR, mCFX 30%, dRCA 60-75% which had progressed from previous study in 2010 => FFR of RCA 0.92 => med Rx    Carotid stenosis    dopplers 1/14:  0-39% bilateral ICA stenosis   Colon polyps    Colon polyps    adenomatous   Diverticular  disease of colon    Hemorrhoids    external and internal   History of BPH    HTN (hypertension)    Hx of echocardiogram    a. Echo 9/11:  EF 55-60%, normal motion, grade 2 diastolic dysfunction, mild MR, mild LAE, mild RAE, PASP 33.   Hyperlipidemia    IBS (irritable bowel syndrome)    Rhinitis    Stroke, lacunar ([place]) 08/24/2012   Old, 2011 head mri   Past Surgical History:  Procedure Laterality Date   APPENDECTOMY  1956   CORONARY STENT PLACEMENT  2008    Successful PCI of the lesion in the proximal LAD using a Promus    HERNIA REPAIR Right 2005   NASAL SINUS SURGERY  1975   PERCUTANEOUS CORONARY STENT INTERVENTION (PCI-S) N/A 05/03/2012   Procedure: PERCUTANEOUS CORONARY STENT INTERVENTION (PCI-S);  Surgeon: Sherren Mocha, MD;  Location: [facility] CATH LAB;  Service: Cardiovascular;  Laterality: N/A;   Promus DES ok for 3T MRI  2008   Social History   Socioeconomic History   Marital status: Married    Spouse name: Not on file   Number of children: 3   Years of education: 16   Highest education level: Not on file  Occupational History   Occupation: businessman, 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RETIRED  Tobacco Use   Smoking status: Never   Smokeless tobacco: Never  Vaping  Use   Vaping Use: Never used  Substance and Sexual Activity   Alcohol use: Not Currently    Comment: 4-6 drinks/week   Drug use: No   Sexual activity: Yes    Partners: Female  Other Topics Concern   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 He has two daughters, 1 in [place] 1 in [place] (Dec '12) and a son who works with him. Several grandchildren.Reports that he spends a good deal of time at his home in [facility] which he greatly enjoys and his home in the [place]. Enjoys driving his BMW convertible when in the mountains.      ACP - Yes CPR, yes for short-term  mechanical ventilation for reversible disease. Recommended [email or web address] for consideration.   Social Determinants of Health   Financial Resource Strain: Low Risk    Difficulty of Paying Living Expenses: Not hard at all  Food Insecurity: No Food Insecurity   Worried About Charity fundraiser in the Last Year: Never true   [place] in the Last Year: Never true  Transportation Needs: No Transportation Needs   Lack of Transportation (Medical): No   Lack of Transportation (Non-Medical): No  Physical Activity: Sufficiently Active   Days of Exercise per Week: 5 days   Minutes of Exercise per Session: 30 min  Stress: No Stress Concern Present   Feeling of Stress : Not at all  Social Connections: Socially Integrated   Frequency of Communication with Friends and Family: More than three times a week   Frequency of Social Gatherings with Friends and Family: More than three times a week   Attends Religious Services: More than 4 times per year   Active Member of [employer] or Organizations: Yes   Attends Music therapist: More than 4 times per year   Marital Status: Married   No Known Allergies Family History  Problem Relation Age of Onset   Coronary artery disease Father    Heart attack Father    Rheum arthritis Mother    Cancer Brother        ? TYPE   Colon cancer Neg Hx    Stomach cancer Neg Hx    Pancreatic cancer Neg Hx     Current Outpatient Medications (Endocrine & Metabolic):    predniSONE (DELTASONE) 50 MG tablet, Take 1 tablet (50 mg total) by mouth daily.  Current Outpatient Medications (Cardiovascular):    lisinopril (ZESTRIL) 10 MG tablet, TAKE 1 TABLET DAILY   metoprolol succinate (TOPROL-XL) 50 MG 24 hr tablet, Take 1 tablet (50 mg total) by mouth daily.   rosuvastatin (CRESTOR) 40 MG tablet, Take 1 tablet (40 mg total) by mouth daily.     Current Outpatient Medications (Other):    ALPRAZolam (XANAX) 0.25 MG tablet, Take 1 tablet (0.25 mg  total) by mouth 2 (two) times daily as needed for anxiety.   dutasteride (AVODART) 0.5 MG capsule, Take 0.5 mg by mouth daily.   gabapentin (NEURONTIN) 100 MG capsule, TAKE 2 CAPSULES(200 MG) BY MOUTH AT BEDTIME   hydrocortisone 2.5 % cream, Apply 1 application topically 2 (two) times daily.   Polyethylene Glycol 3350 (MIRALAX PO), Take by mouth.   psyllium (METAMUCIL) 58.6 % packet, Take 1 packet by mouth daily.   tamsulosin (FLOMAX) 0.4 MG CAPS capsule, Take 0.4 mg by mouth daily.   traZODone (DESYREL)  50 MG tablet, Take 25-'50MG'$  daily at bedtime   zolpidem (AMBIEN) 10 MG tablet, TAKE 1 TABLET(10 MG) BY MOUTH AT BEDTIME AS NEEDED FOR SLEEP   Reviewed prior external information including notes and imaging from  primary care provider As well as notes that were available from care everywhere and other healthcare systems.  Past medical history, social, surgical and family history all reviewed in electronic medical record.  No pertanent information unless stated regarding to the chief complaint.   Review of Systems:  No headache, visual changes, nausea, vomiting, diarrhea, constipation, dizziness, abdominal pain, skin rash, fevers, chills, night sweats, weight loss, swollen lymph nodes, body aches, joint swelling, chest pain, shortness of breath, mood changes. POSITIVE muscle aches  Objective  Blood pressure 132/60, pulse (!) 59, height '5\' 10"'$  (1.778 m), weight 142 lb (64.4 kg), SpO2 96 %.   General: No apparent distress alert and oriented x3 mood and affect normal, dressed appropriately.  HEENT: Pupils equal, extraocular movements intact  Respiratory: Patient's speak in full sentences and does not appear short of breath  Cardiovascular: No lower extremity edema, non tender, no erythema  Gait normal with good balance and coordination.  MSK:  left glute TTP but does have relatively good strength.  Patient does have some arthritic changes of multiple joints.    Impression and  Recommendations:    The above documentation has been reviewed and is accurate and complete Lyndal Pulley, DO

## 2021-02-01 ENCOUNTER — Ambulatory Visit
Admission: RE | Admit: 2021-02-01 | Discharge: 2021-02-01 | Disposition: A | Discharge status: Home / Self Care | Payer: Medicare Other | Source: Ambulatory Visit | Attending: Family Medicine | Admitting: Family Medicine

## 2021-02-01 DIAGNOSIS — M545 Low back pain, unspecified: Secondary | ICD-10-CM

## 2021-02-01 DIAGNOSIS — M25551 Pain in right hip: Secondary | ICD-10-CM

## 2021-02-01 DIAGNOSIS — S76312A Strain of muscle, fascia and tendon of the posterior muscle group at thigh level, left thigh, initial encounter: Secondary | ICD-10-CM | POA: Diagnosis not present

## 2021-02-01 DIAGNOSIS — M48061 Spinal stenosis, lumbar region without neurogenic claudication: Secondary | ICD-10-CM | POA: Diagnosis not present

## 2021-02-03 ENCOUNTER — Encounter: Payer: Self-pay | Admitting: Family Medicine

## 2021-02-03 ENCOUNTER — Other Ambulatory Visit: Payer: Self-pay

## 2021-02-03 ENCOUNTER — Ambulatory Visit (INDEPENDENT_AMBULATORY_CARE_PROVIDER_SITE_OTHER): Payer: Medicare Other | Admitting: Family Medicine

## 2021-02-03 DIAGNOSIS — S76019A Strain of muscle, fascia and tendon of unspecified hip, initial encounter: Secondary | ICD-10-CM | POA: Insufficient documentation

## 2021-02-03 DIAGNOSIS — I2510 Atherosclerotic heart disease of native coronary artery without angina pectoris: Secondary | ICD-10-CM | POA: Diagnosis not present

## 2021-02-03 NOTE — Patient Instructions (Addendum)
Good to see you  Read about PRP but with doing so well we will hold for now Stay active Rotate the mattress Newton running shoes or new balance 990 Write [place] if you feel like you need PRP at a later date

## 2021-02-03 NOTE — Assessment & Plan Note (Signed)
Tentatively getting relatively well this time.  We will continue to monitor.  Having pain mostly at night.  We discussed avoiding certain activities.  Follow-up as needed.  Patient given handout for PRP and discussed proper shoes.

## 2021-02-18 ENCOUNTER — Other Ambulatory Visit: Payer: Self-pay | Admitting: Family Medicine

## 2021-02-24 ENCOUNTER — Other Ambulatory Visit: Payer: Self-pay | Admitting: Cardiovascular Disease

## 2021-02-24 DIAGNOSIS — R3915 Urgency of urination: Secondary | ICD-10-CM | POA: Diagnosis not present

## 2021-03-03 ENCOUNTER — Other Ambulatory Visit: Payer: Self-pay

## 2021-03-03 MED ORDER — ROSUVASTATIN CALCIUM 40 MG PO TABS
40.0000 mg | ORAL_TABLET | Freq: Every day | ORAL | 0 refills | Status: DC
  Filled 2021-03-03: qty 15, 15d supply, fill #0

## 2021-03-04 ENCOUNTER — Ambulatory Visit: Payer: Self-pay

## 2021-03-04 ENCOUNTER — Ambulatory Visit (INDEPENDENT_AMBULATORY_CARE_PROVIDER_SITE_OTHER): Payer: Medicare Other | Admitting: Family Medicine

## 2021-03-04 ENCOUNTER — Other Ambulatory Visit: Payer: Self-pay

## 2021-03-04 ENCOUNTER — Encounter: Payer: Self-pay | Admitting: Family Medicine

## 2021-03-04 VITALS — BP 128/70 | HR 58 | Ht 70.0 in | Wt 144.0 lb

## 2021-03-04 DIAGNOSIS — M19011 Primary osteoarthritis, right shoulder: Secondary | ICD-10-CM | POA: Diagnosis not present

## 2021-03-04 DIAGNOSIS — I2510 Atherosclerotic heart disease of native coronary artery without angina pectoris: Secondary | ICD-10-CM

## 2021-03-04 DIAGNOSIS — M25511 Pain in right shoulder: Secondary | ICD-10-CM

## 2021-03-04 DIAGNOSIS — M71311 Other bursal cyst, right shoulder: Secondary | ICD-10-CM | POA: Diagnosis not present

## 2021-03-04 NOTE — Assessment & Plan Note (Signed)
Aspiration done today.  Seems to be more of a anterior cyst.  No concern now that there could be some labral pathology.  If continuing to give patient difficulty or notice some weakness of the shoulder we will need to consider the possibility of advanced imaging.  Even though the patient does have advanced age patient could be a candidate for surgical repair if necessary but unlikely.  Hopefully will do well with conservative therapy.

## 2021-03-04 NOTE — Assessment & Plan Note (Signed)
Patient given injection today.  I do believe that there is underlying arthritic changes of the glenohumeral joint as well that could be can be playing a role.  Discussed with patient about icing regimen and home exercises, which activities to do which wants to avoid.  Patient is to increase activity slowly.  Hopefully this will make significant improvement with patient previously responding.  Otherwise advanced imaging may be warranted.  Patient is 87 years old but is significantly active and even though of course we would like to avoid any surgical intervention patient would be a surgical candidate.

## 2021-03-04 NOTE — Patient Instructions (Addendum)
Drained cyst Injection today Do prescribed exercises at least 3x a week See you again in 4-5 weeks

## 2021-03-04 NOTE — Progress Notes (Signed)
Zach Calvert [place] Short [address] [place] Phone: [phone] Subjective:   [name], am serving as a scribe for Dr. Hulan Saas. This visit occurred during the SARS-CoV-2 public health emergency.  Safety protocols were in place, including screening questions prior to the visit, additional usage of staff PPE, and extensive cleaning of exam room while observing appropriate contact time as indicated for disinfecting solutions.   I'm seeing this patient by the request  of:  [name], MD  CC: Right shoulder pain  LEX:[id number]  KYLYN SOOKRAM is a 87 y.o. male coming in with complaint of right shoulder and arm pain. Three weeks ago was reaching into the back seat and felt some pain. Pain hasn't gotten better since. Taking 200mg  ibuprofen twice a day. 2/10 pain level on a regular basis. No numbness or tingling down arm.      Past Medical History:  Diagnosis Date   Anxiety    CAD (coronary artery disease)    a. s/p stent to LAD 2008;  b. abnormal ETT 11/13 with VTach => LHC pLAD 30% ISR, mCFX 30%, dRCA 60-75% which had progressed from previous study in 2010 => FFR of RCA 0.92 => med Rx    Carotid stenosis    dopplers 1/14:  0-39% bilateral ICA stenosis   Colon polyps    Colon polyps    adenomatous   Diverticular disease of colon    Hemorrhoids    external and internal   History of BPH    HTN (hypertension)    Hx of echocardiogram    a. Echo 9/11:  EF 55-60%, normal motion, grade 2 diastolic dysfunction, mild MR, mild LAE, mild RAE, PASP 33.   Hyperlipidemia    IBS (irritable bowel syndrome)    Rhinitis    Stroke, lacunar ([place]) 08/24/2012   Old, 2011 head mri   Past Surgical History:  Procedure Laterality Date   APPENDECTOMY  1956   CORONARY STENT PLACEMENT  2008    Successful PCI of the lesion in the proximal LAD using a Promus    HERNIA REPAIR Right 2005   NASAL SINUS SURGERY  1975   PERCUTANEOUS CORONARY STENT  INTERVENTION (PCI-S) N/A 05/03/2012   Procedure: PERCUTANEOUS CORONARY STENT INTERVENTION (PCI-S);  Surgeon: Sherren Mocha, MD;  Location: [facility] CATH LAB;  Service: Cardiovascular;  Laterality: N/A;   Promus DES ok for 3T MRI  2008   Social History   Socioeconomic History   Marital status: Married    Spouse name: Not on file   Number of children: 3   Years of education: 16   Highest education level: Not on file  Occupational History   Occupation: businessman, 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RETIRED  Tobacco Use   Smoking status: Never   Smokeless tobacco: Never  Vaping Use   Vaping Use: Never used  Substance and Sexual Activity   Alcohol use: Not Currently    Comment: 4-6 drinks/week   Drug use: No   Sexual activity: Yes    Partners: Female  Other Topics Concern   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 He has two daughters, 1 in [place] 1 in [place] (Dec '12) and a son who works with him. Several grandchildren.Reports that he spends a good deal of time at his home in [facility] which  he greatly enjoys and his home in the [place]. Enjoys driving his BMW convertible when in the mountains.      ACP - Yes CPR, yes for short-term mechanical ventilation for reversible disease. Recommended [email or web address] for consideration.   Social Determinants of Health   Financial Resource Strain: Low Risk    Difficulty of Paying Living Expenses: Not hard at all  Food Insecurity: No Food Insecurity   Worried About Charity fundraiser in the Last Year: Never true   [place] in the Last Year: Never true  Transportation Needs: No Transportation Needs   Lack of Transportation (Medical): No   Lack of Transportation (Non-Medical): No  Physical Activity: Sufficiently Active   Days of Exercise per Week: 5 days   Minutes of Exercise per  Session: 30 min  Stress: No Stress Concern Present   Feeling of Stress : Not at all  Social Connections: Socially Integrated   Frequency of Communication with Friends and Family: More than three times a week   Frequency of Social Gatherings with Friends and Family: More than three times a week   Attends Religious Services: More than 4 times per year   Active Member of [employer] or Organizations: Yes   Attends Music therapist: More than 4 times per year   Marital Status: Married   No Known Allergies Family History  Problem Relation Age of Onset   Coronary artery disease Father    Heart attack Father    Rheum arthritis Mother    Cancer Brother        ? TYPE   Colon cancer Neg Hx    Stomach cancer Neg Hx    Pancreatic cancer Neg Hx     Current Outpatient Medications (Endocrine & Metabolic):    predniSONE (DELTASONE) 50 MG tablet, Take 1 tablet (50 mg total) by mouth daily.  Current Outpatient Medications (Cardiovascular):    lisinopril (ZESTRIL) 10 MG tablet, TAKE 1 TABLET DAILY   metoprolol succinate (TOPROL-XL) 50 MG 24 hr tablet, Take 1 tablet (50 mg total) by mouth daily.   rosuvastatin (CRESTOR) 40 MG tablet, Take 1 tablet (40 mg total) by mouth daily.     Current Outpatient Medications (Other):    ALPRAZolam (XANAX) 0.25 MG tablet, Take 1 tablet (0.25 mg total) by mouth 2 (two) times daily as needed for anxiety.   dutasteride (AVODART) 0.5 MG capsule, Take 0.5 mg by mouth daily.   gabapentin (NEURONTIN) 100 MG capsule, TAKE 2 CAPSULES(200 MG) BY MOUTH AT BEDTIME   hydrocortisone 2.5 % cream, Apply 1 application topically 2 (two) times daily.   Polyethylene Glycol 3350 (MIRALAX PO), Take by mouth.   psyllium (METAMUCIL) 58.6 % packet, Take 1 packet by mouth daily.   tamsulosin (FLOMAX) 0.4 MG CAPS capsule, Take 0.4 mg by mouth daily.   traZODone (DESYREL) 50 MG tablet, TAKE 1/2 TO 1 TABLET BY MOUTH DAILY AT BEDTIME   zolpidem (AMBIEN) 10 MG tablet, TAKE 1  TABLET(10 MG) BY MOUTH AT BEDTIME AS NEEDED FOR SLEEP   Reviewed prior external information including notes and imaging from  primary care provider As well as notes that were available from care everywhere and other healthcare systems.  Past medical history, social, surgical and family history all reviewed in electronic medical record.  No pertanent information unless stated regarding to the chief complaint.   Review of Systems:  No headache, visual changes, nausea, vomiting, diarrhea, constipation, dizziness, abdominal pain, skin rash, fevers, chills, night sweats, weight  loss, swollen lymph nodes, body aches, joint swelling, chest pain, shortness of breath, mood changes. POSITIVE muscle aches  Objective  Blood pressure 128/70, pulse (!) 58, height 5\' 10"  (1.778 m), weight 144 lb (65.3 kg), SpO2 95 %.   General: No apparent distress alert and oriented x3 mood and affect normal, dressed appropriately.  HEENT: Pupils equal, extraocular movements intact  Respiratory: Patient's speak in full sentences and does not appear short of breath  Cardiovascular: No lower extremity edema, non tender, no erythema  Gait normal with good balance and coordination.  MSK: Right shoulder pain does have some mild decrease in range of motion.  Patient does have tenderness to palpation over the acromioclavicular joint as well as a positive impingement noted.  Limited muscular skeletal ultrasound was performed and interpreted by Hulan Saas, M  Limited ultrasound of patient's right shoulder shows the patient does have a large anterior cyst noted.  Cannot tell if it does have communication with the bicep tendon.  Seems to be intra-articular.  Patient does have trace effusion into the glenohumeral joint on the posterior aspect as well.  Patient's acromioclavicular joint does have arthritic changes with large effusion noted. Impression: Shoulder bursitis anteriorly and acromioclavicular arthritis   Procedure:  Real-time Ultrasound Guided Injection of right glenohumeral joint Device: GE Logiq Q7  Ultrasound guided injection is preferred based studies that show increased duration, increased effect, greater accuracy, decreased procedural pain, increased response rate with ultrasound guided versus blind injection.  Verbal informed consent obtained.  Time-out conducted.  Noted no overlying erythema, induration, or other signs of local infection.  Skin prepped in a sterile fashion.  Local anesthesia: Topical Ethyl chloride.  With sterile technique and under real time ultrasound guidance:  Joint visualized.  23g 1  inch needle inserted anterior approach. Pictures taken for needle placement. Patient did have injection of  2 cc of 0.5% Marcaine, and aspirated 20 cc of straw light-colored fluid then injected 1.0 cc of Kenalog 40 mg/dL. Completed without difficulty  Pain immediately resolved suggesting accurate placement of the medication.  Advised to call if fevers/chills, erythema, induration, drainage, or persistent bleeding.  Impression: Technically successful ultrasound guided injection.  Procedure: Real-time Ultrasound Guided Injection of right acromioclavicular joint Device: GE Logiq Q7 Ultrasound guided injection is preferred based studies that show increased duration, increased effect, greater accuracy, decreased procedural pain, increased response rate, and decreased cost with ultrasound guided versus blind injection.  Verbal informed consent obtained.  Time-out conducted.  Noted no overlying erythema, induration, or other signs of local infection.  Skin prepped in a sterile fashion.  Local anesthesia: Topical Ethyl chloride.  With sterile technique and under real time ultrasound guidance: With a 25-gauge half inch needle injected with 0.5 cc of 0.5% Marcaine and 0.5 cc of Kenalog 40 mg/mL Completed without difficulty  Pain immediately resolved suggesting accurate placement of the medication.   Advised to call if fevers/chills, erythema, induration, drainage, or persistent bleeding.  Impression: Technically successful ultrasound guided injection.    Impression and Recommendations:     The above documentation has been reviewed and is accurate and complete Lyndal Pulley, DO

## 2021-03-05 ENCOUNTER — Other Ambulatory Visit: Payer: Self-pay

## 2021-03-12 ENCOUNTER — Ambulatory Visit: Payer: Medicare Other | Admitting: Family Medicine

## 2021-03-19 DIAGNOSIS — Z85820 Personal history of malignant melanoma of skin: Secondary | ICD-10-CM | POA: Diagnosis not present

## 2021-03-19 DIAGNOSIS — L218 Other seborrheic dermatitis: Secondary | ICD-10-CM | POA: Diagnosis not present

## 2021-03-19 DIAGNOSIS — Z85828 Personal history of other malignant neoplasm of skin: Secondary | ICD-10-CM | POA: Diagnosis not present

## 2021-03-19 DIAGNOSIS — L570 Actinic keratosis: Secondary | ICD-10-CM | POA: Diagnosis not present

## 2021-03-19 DIAGNOSIS — L820 Inflamed seborrheic keratosis: Secondary | ICD-10-CM | POA: Diagnosis not present

## 2021-03-24 DIAGNOSIS — R3915 Urgency of urination: Secondary | ICD-10-CM | POA: Diagnosis not present

## 2021-04-06 ENCOUNTER — Other Ambulatory Visit: Payer: Self-pay | Admitting: Cardiovascular Disease

## 2021-04-06 NOTE — Progress Notes (Signed)
[name] [name] [address] [place] Phone: [phone] Subjective:   [name] [name], am serving as a scribe for Dr. Hulan Saas. This visit occurred during the SARS-CoV-2 public health emergency.  Safety protocols were in place, including screening questions prior to the visit, additional usage of staff PPE, and extensive cleaning of exam room while observing appropriate contact time as indicated for disinfecting solutions.   I'm seeing this patient by the request  of:  [name], MD  CC: shoulder pain   XTG:[id number]  03/04/2021 Aspiration done today.  Seems to be more of a anterior cyst.  No concern now that there could be some labral pathology.  If continuing to give patient difficulty or notice some weakness of the shoulder we will need to consider the possibility of advanced imaging.  Even though the patient does have advanced age patient could be a candidate for surgical repair if necessary but unlikely.  Hopefully will do well with conservative therapy.  Patient given injection today.  I do believe that there is underlying arthritic changes of the glenohumeral joint as well that could be can be playing a role.  Discussed with patient about icing regimen and home exercises, which activities to do which wants to avoid.  Patient is to increase activity slowly.  Hopefully this will make significant improvement with patient previously responding.  Otherwise advanced imaging may be warranted.  Patient is 87 years old but is significantly active and even though of course we would like to avoid any surgical intervention patient would be a surgical candidate.  Update 04/07/2021 [name] [name] is a 87 y.o. male coming in with complaint of R shoulder pain.  Had what appeared to be more of an anterior cyst of the shoulder as well as potential labral pathology.  Also arthritic changes of the acromioclavicular joint.  Patient given injection in both  areas.  Patient states shoulder is not doing well. Sleeping is very hard. Painful. When doing exercises it hurts more at night. Wants to do PT with [facility]. What do you think about acupuncture. Wants to know which shoe you recommend. It was a new balance. Left hip has been acting up feels when sleeping.  Right shoulder x-rays from August showed the patient to have mild degenerative changes of the acromioclavicular joint but otherwise unremarkable    Past Medical History:  Diagnosis Date   Anxiety    CAD (coronary artery disease)    a. s/p stent to LAD 2008;  b. abnormal ETT 11/13 with VTach => LHC pLAD 30% ISR, mCFX 30%, dRCA 60-75% which had progressed from previous study in 2010 => FFR of RCA 0.92 => med Rx    Carotid stenosis    dopplers 1/14:  0-39% bilateral ICA stenosis   Colon polyps    Colon polyps    adenomatous   Diverticular disease of colon    Hemorrhoids    external and internal   History of BPH    HTN (hypertension)    Hx of echocardiogram    a. Echo 9/11:  EF 55-60%, normal motion, grade 2 diastolic dysfunction, mild MR, mild LAE, mild RAE, PASP 33.   Hyperlipidemia    IBS (irritable bowel syndrome)    Rhinitis    Stroke, lacunar ([place]) 08/24/2012   Old, 2011 head mri   Past Surgical History:  Procedure Laterality Date   APPENDECTOMY  1956   CORONARY STENT PLACEMENT  2008    Successful  PCI of the lesion in the proximal LAD using a Promus    HERNIA REPAIR Right 2005   NASAL SINUS SURGERY  1975   PERCUTANEOUS CORONARY STENT INTERVENTION (PCI-S) N/A 05/03/2012   Procedure: PERCUTANEOUS CORONARY STENT INTERVENTION (PCI-S);  Surgeon: Sherren Mocha, MD;  Location: [facility] CATH LAB;  Service: Cardiovascular;  Laterality: N/A;   Promus DES ok for 3T MRI  2008   Social History   Socioeconomic History   Marital status: Married    Spouse name: Not on file   Number of children: 3   Years of education: 16   Highest education level: Not on file  Occupational History    Occupation: businessman, 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RETIRED  Tobacco Use   Smoking status: Never   Smokeless tobacco: Never  Vaping Use   Vaping Use: Never used  Substance and Sexual Activity   Alcohol use: Not Currently    Comment: 4-6 drinks/week   Drug use: No   Sexual activity: Yes    Partners: Female  Other Topics Concern   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 He has two daughters, 1 in [place] 1 in [place] (Dec '12) and a son who works with him. Several grandchildren.Reports that he spends a good deal of time at his home in [facility] which he greatly enjoys and his home in the [place]. Enjoys driving his BMW convertible when in the mountains.      ACP - Yes CPR, yes for short-term mechanical ventilation for reversible disease. Recommended [email or web address] for consideration.   Social Determinants of Health   Financial Resource Strain: Low Risk    Difficulty of Paying Living Expenses: Not hard at all  Food Insecurity: No Food Insecurity   Worried About Charity fundraiser in the Last Year: Never true   [place] in the Last Year: Never true  Transportation Needs: No Transportation Needs   Lack of Transportation (Medical): No   Lack of Transportation (Non-Medical): No  Physical Activity: Sufficiently Active   Days of Exercise per Week: 5 days   Minutes of Exercise per Session: 30 min  Stress: No Stress Concern Present   Feeling of Stress : Not at all  Social Connections: Socially Integrated   Frequency of Communication with Friends and Family: More than three times a week   Frequency of Social Gatherings with Friends and Family: More than three times a week   Attends Religious Services: More than 4 times per year   Active Member of [employer] or Organizations: Yes   Attends Music therapist: More  than 4 times per year   Marital Status: Married   No Known Allergies Family History  Problem Relation Age of Onset   Coronary artery disease Father    Heart attack Father    Rheum arthritis Mother    Cancer Brother        ? TYPE   Colon cancer Neg Hx    Stomach cancer Neg Hx    Pancreatic cancer Neg Hx     Current Outpatient Medications (Endocrine & Metabolic):    predniSONE (DELTASONE) 50 MG tablet, Take 1 tablet (50 mg total) by mouth daily.  Current Outpatient Medications (Cardiovascular):    lisinopril (ZESTRIL) 10 MG tablet, TAKE 1 TABLET DAILY   metoprolol succinate (TOPROL-XL) 50 MG 24 hr tablet, Take  1 tablet (50 mg total) by mouth daily.   rosuvastatin (CRESTOR) 40 MG tablet, Take 1 tablet (40 mg total) by mouth daily.     Current Outpatient Medications (Other):    ALPRAZolam (XANAX) 0.25 MG tablet, Take 1 tablet (0.25 mg total) by mouth 2 (two) times daily as needed for anxiety.   dutasteride (AVODART) 0.5 MG capsule, Take 0.5 mg by mouth daily.   gabapentin (NEURONTIN) 100 MG capsule, TAKE 2 CAPSULES(200 MG) BY MOUTH AT BEDTIME   hydrocortisone 2.5 % cream, Apply 1 application topically 2 (two) times daily.   Polyethylene Glycol 3350 (MIRALAX PO), Take by mouth.   psyllium (METAMUCIL) 58.6 % packet, Take 1 packet by mouth daily.   tamsulosin (FLOMAX) 0.4 MG CAPS capsule, Take 0.4 mg by mouth daily.   traZODone (DESYREL) 50 MG tablet, TAKE 1/2 TO 1 TABLET BY MOUTH DAILY AT BEDTIME   zolpidem (AMBIEN) 10 MG tablet, TAKE 1 TABLET(10 MG) BY MOUTH AT BEDTIME AS NEEDED FOR SLEEP   Reviewed prior external information including notes and imaging from  primary care provider As well as notes that were available from care everywhere and other healthcare systems.  Past medical history, social, surgical and family history all reviewed in electronic medical record.  No pertanent information unless stated regarding to the chief complaint.   Review of Systems:  No headache,  visual changes, nausea, vomiting, diarrhea, constipation, dizziness, abdominal pain, skin rash, fevers, chills, night sweats, weight loss, swollen lymph nodes, body aches, joint swelling, chest pain, shortness of breath, mood changes. POSITIVE muscle aches  Objective  Blood pressure 118/76, pulse 91, height 5\' 10"  (1.778 m), weight 144 lb (65.3 kg), SpO2 93 %.   General: No apparent distress alert and oriented x3 mood and affect normal, dressed appropriately.  HEENT: Pupils equal, extraocular movements intact  Respiratory: Patient's speak in full sentences and does not appear short of breath  Cardiovascular: No lower extremity edema, non tender, no erythema  Gait normal with good balance and coordination.  MSK:  Shoulder exam shows patient does have good strength of the rotator cuff noted.  Positive impingement with positive speeds noted.  Positive O'Brien's noted.   Limited muscular skeletal ultrasound was performed and interpreted by Hulan Saas, M  Limited ultrasound of patient's right shoulder shows the patient does have hypoechoic changes in the anterior shoulder but not as severe as it was previously.  Patient has what appears to be potentially having the anterior labral tear or some type of insufficiency causing mild subluxation of the bicep tendon.  Patient does have multiple other areas consistent with other bursitis noted.    Impression and Recommendations:     The above documentation has been reviewed and is accurate and complete [name] Pulley, DO

## 2021-04-07 ENCOUNTER — Ambulatory Visit (INDEPENDENT_AMBULATORY_CARE_PROVIDER_SITE_OTHER): Payer: Medicare Other | Admitting: Family Medicine

## 2021-04-07 ENCOUNTER — Ambulatory Visit: Payer: Self-pay

## 2021-04-07 ENCOUNTER — Encounter: Payer: Self-pay | Admitting: Cardiovascular Disease

## 2021-04-07 ENCOUNTER — Encounter: Payer: Self-pay | Admitting: Family Medicine

## 2021-04-07 ENCOUNTER — Other Ambulatory Visit: Payer: Self-pay

## 2021-04-07 VITALS — BP 118/76 | HR 91 | Ht 70.0 in | Wt 144.0 lb

## 2021-04-07 DIAGNOSIS — I2510 Atherosclerotic heart disease of native coronary artery without angina pectoris: Secondary | ICD-10-CM | POA: Diagnosis not present

## 2021-04-07 DIAGNOSIS — M71311 Other bursal cyst, right shoulder: Secondary | ICD-10-CM

## 2021-04-07 MED ORDER — LISINOPRIL 10 MG PO TABS
10.0000 mg | ORAL_TABLET | Freq: Every day | ORAL | 0 refills | Status: DC
Start: 2021-04-07 — End: 2021-05-07

## 2021-04-07 NOTE — Patient Instructions (Addendum)
[place] [phone] Call Today  When we receive your results we will contact you.  After that we can discuss PRP or PT

## 2021-04-07 NOTE — Assessment & Plan Note (Signed)
Patient does have the recurrent inflammation of noted of the shoulder.  Concerned that this may be coming in deeper in the shoulder.  Concern for labral pathology as well.  Do feel at this point that advanced imaging is warranted with this effusion coming back so quickly.  Patient is having pain that is affecting daily activities as well as waking him up at night.  Patient will come back after imaging to discuss further.

## 2021-04-08 DIAGNOSIS — H52203 Unspecified astigmatism, bilateral: Secondary | ICD-10-CM | POA: Diagnosis not present

## 2021-04-08 DIAGNOSIS — H5213 Myopia, bilateral: Secondary | ICD-10-CM | POA: Diagnosis not present

## 2021-04-08 DIAGNOSIS — H2512 Age-related nuclear cataract, left eye: Secondary | ICD-10-CM | POA: Diagnosis not present

## 2021-04-13 ENCOUNTER — Encounter: Payer: Self-pay | Admitting: Family Medicine

## 2021-04-21 DIAGNOSIS — R3915 Urgency of urination: Secondary | ICD-10-CM | POA: Diagnosis not present

## 2021-04-22 NOTE — Progress Notes (Signed)
Zach [facility]ac[facility] [address] [place] Phone: [phone] Subjective:   [name], am serving as a scribe for Dr. Hulan Saas. This visit occurred during the SARS-CoV-2 public health emergency.  Safety protocols were in place, including screening questions prior to the visit, additional usage of staff PPE, and extensive cleaning of exam room while observing appropriate contact time as indicated for disinfecting solutions.   I'm seeing this patient by the request  of:  [name], MD  CC: shoulder pain   [id number]  04/07/2021 Patient does have the recurrent inflammation of noted of the shoulder.  Concerned that this may be coming in deeper in the shoulder.  Concern for labral pathology as well.  Do feel at this point that advanced imaging is warranted with this effusion coming back so quickly.  Patient is having pain that is affecting daily activities as well as waking him up at night.  Patient will come back after imaging to discuss further.  Update 04/28/2021 ZAYDYN HAVEY is a 87 y.o. male coming in with complaint of R shoulder pain. Patient states wants to talk about PRP. Same amoutn of pain. Some nights worse.  MRI R shoulder 04/26/2021 Full thickness tear of SST with 2.6cm of retraction, moderate AC arthritis and tendonosis of the rest of the rotator cuff     Past Medical History:  Diagnosis Date   Anxiety    CAD (coronary artery disease)    a. s/p stent to LAD 2008;  b. abnormal ETT 11/13 with VTach => LHC pLAD 30% ISR, mCFX 30%, dRCA 60-75% which had progressed from previous study in 2010 => FFR of RCA 0.92 => med Rx    Carotid stenosis    dopplers 1/14:  0-39% bilateral ICA stenosis   Colon polyps    Colon polyps    adenomatous   Diverticular disease of colon    Hemorrhoids    external and internal   History of BPH    HTN (hypertension)    Hx of echocardiogram    a. Echo 9/11:  EF 55-60%, normal motion, grade  2 diastolic dysfunction, mild MR, mild LAE, mild RAE, PASP 33.   Hyperlipidemia    IBS (irritable bowel syndrome)    Rhinitis    Stroke, lacunar ([place]) 08/24/2012   Old, 2011 head mri   Past Surgical History:  Procedure Laterality Date   APPENDECTOMY  1956   CORONARY STENT PLACEMENT  2008    Successful PCI of the lesion in the proximal LAD using a Promus    HERNIA REPAIR Right 2005   NASAL SINUS SURGERY  1975   PERCUTANEOUS CORONARY STENT INTERVENTION (PCI-S) N/A 05/03/2012   Procedure: PERCUTANEOUS CORONARY STENT INTERVENTION (PCI-S);  Surgeon: Sherren Mocha, MD;  Location: [facility] CATH LAB;  Service: Cardiovascular;  Laterality: N/A;   Promus DES ok for 3T MRI  2008   Social History   Socioeconomic History   Marital status: Married    Spouse name: Not on file   Number of children: 3   Years of education: 16   Highest education level: Not on file  Occupational History   Occupation: businessman, 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RETIRED  Tobacco Use   Smoking status: Never   Smokeless tobacco: Never  Vaping Use   Vaping Use: Never used  Substance and Sexual Activity   Alcohol use: Not Currently    Comment: 4-6 drinks/week   Drug use: No  Sexual activity: Yes    Partners: Female  Other Topics Concern   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 He has two daughters, 1 in [place] 1 in [place] (Dec '12) and a son who works with him. Several grandchildren.Reports that he spends a good deal of time at his home in [place] [facility] which he greatly enjoys and his home in the [place]. Enjoys driving his BMW convertible when in the mountains.      ACP - Yes CPR, yes for short-term mechanical ventilation for reversible disease. Recommended [email or web address] for consideration.   Social Determinants of Health   Financial Resource Strain: Low  Risk    Difficulty of Paying Living Expenses: Not hard at all  Food Insecurity: No Food Insecurity   Worried About Charity fundraiser in the Last Year: Never true   [place] in the Last Year: Never true  Transportation Needs: No Transportation Needs   Lack of Transportation (Medical): No   Lack of Transportation (Non-Medical): No  Physical Activity: Sufficiently Active   Days of Exercise per Week: 5 days   Minutes of Exercise per Session: 30 min  Stress: No Stress Concern Present   Feeling of Stress : Not at all  Social Connections: Socially Integrated   Frequency of Communication with Friends and Family: More than three times a week   Frequency of Social Gatherings with Friends and Family: More than three times a week   Attends Religious Services: More than 4 times per year   Active Member of [employer] or Organizations: Yes   Attends Music therapist: More than 4 times per year   Marital Status: Married   No Known Allergies Family History  Problem Relation Age of Onset   Coronary artery disease Father    Heart attack Father    Rheum arthritis Mother    Cancer Brother        ? TYPE   Colon cancer Neg Hx    Stomach cancer Neg Hx    Pancreatic cancer Neg Hx     Current Outpatient Medications (Endocrine & Metabolic):    predniSONE (DELTASONE) 50 MG tablet, Take 1 tablet (50 mg total) by mouth daily.  Current Outpatient Medications (Cardiovascular):    lisinopril (ZESTRIL) 10 MG tablet, Take 1 tablet (10 mg total) by mouth daily.   metoprolol succinate (TOPROL-XL) 50 MG 24 hr tablet, Take 1 tablet (50 mg total) by mouth daily.   rosuvastatin (CRESTOR) 40 MG tablet, Take 1 tablet (40 mg total) by mouth daily.     Current Outpatient Medications (Other):    ALPRAZolam (XANAX) 0.25 MG tablet, Take 1 tablet (0.25 mg total) by mouth 2 (two) times daily as needed for anxiety.   dutasteride (AVODART) 0.5 MG capsule, Take 0.5 mg by mouth daily.   gabapentin  (NEURONTIN) 100 MG capsule, TAKE 2 CAPSULES(200 MG) BY MOUTH AT BEDTIME   hydrocortisone 2.5 % cream, Apply 1 application topically 2 (two) times daily.   Polyethylene Glycol 3350 (MIRALAX PO), Take by mouth.   psyllium (METAMUCIL) 58.6 % packet, Take 1 packet by mouth daily.   tamsulosin (FLOMAX) 0.4 MG CAPS capsule, Take 0.4 mg by mouth daily.   traZODone (DESYREL) 50 MG tablet, TAKE 1/2 TO 1 TABLET BY MOUTH DAILY AT BEDTIME   zolpidem (AMBIEN) 10 MG tablet, TAKE 1 TABLET(10 MG) BY MOUTH  AT BEDTIME AS NEEDED FOR SLEEP   Reviewed prior external information including notes and imaging from  primary care provider As well as notes that were available from care everywhere and other healthcare systems.  Past medical history, social, surgical and family history all reviewed in electronic medical record.  No pertanent information unless stated regarding to the chief complaint.   Review of Systems:  No headache, visual changes, nausea, vomiting, diarrhea, constipation, dizziness, abdominal pain, skin rash, fevers, chills, night sweats, weight loss, swollen lymph nodes, body aches, joint swelling, chest pain, shortness of breath, mood changes. POSITIVE muscle aches  Objective  Blood pressure 120/76, pulse 84, height 5\' 10"  (1.778 m), weight 144 lb (65.3 kg), SpO2 93 %.   General: No apparent distress alert and oriented x3 mood and affect normal, dressed appropriately.  HEENT: Pupils equal, extraocular movements intact  Respiratory: Patient's speak in full sentences and does not appear short of breath  Cardiovascular: No lower extremity edema, non tender, no erythema  Gait normal with good balance and coordination.  MSK:   Right shoulder exam shows the patient does have some very mild tenderness to palpation.  Patient does have 4-5 strength of the rotator cuff but still does have good strength overall.  Patient does have mild positive O'Brien's.   Impression and Recommendations:     The above  documentation has been reviewed and is accurate and complete Lyndal Pulley, DO

## 2021-04-23 ENCOUNTER — Other Ambulatory Visit: Payer: Self-pay | Admitting: Family Medicine

## 2021-04-26 ENCOUNTER — Ambulatory Visit
Admission: RE | Admit: 2021-04-26 | Discharge: 2021-04-26 | Disposition: A | Discharge status: Home / Self Care | Payer: Medicare Other | Source: Ambulatory Visit | Attending: Family Medicine | Admitting: Family Medicine

## 2021-04-26 ENCOUNTER — Other Ambulatory Visit: Payer: Self-pay

## 2021-04-26 DIAGNOSIS — M71311 Other bursal cyst, right shoulder: Secondary | ICD-10-CM

## 2021-04-26 DIAGNOSIS — S46011A Strain of muscle(s) and tendon(s) of the rotator cuff of right shoulder, initial encounter: Secondary | ICD-10-CM | POA: Diagnosis not present

## 2021-04-26 MED ORDER — GADOBENATE DIMEGLUMINE 529 MG/ML IV SOLN
13.0000 mL | Freq: Once | INTRAVENOUS | Status: AC | PRN
  Administered 2021-04-26: 13 mL via INTRAVENOUS

## 2021-04-27 MED ORDER — ROSUVASTATIN CALCIUM 40 MG PO TABS: 40.0000 mg | ORAL_TABLET | Freq: Every day | ORAL | 2 refills | Status: DC

## 2021-04-27 NOTE — Addendum Note (Signed)
Addended by: Carter Kitten D on: 04/27/2021 12:48 PM   Modules accepted: Orders

## 2021-04-28 ENCOUNTER — Ambulatory Visit: Payer: Self-pay

## 2021-04-28 ENCOUNTER — Ambulatory Visit (INDEPENDENT_AMBULATORY_CARE_PROVIDER_SITE_OTHER): Payer: Medicare Other | Admitting: Family Medicine

## 2021-04-28 ENCOUNTER — Other Ambulatory Visit: Payer: Self-pay

## 2021-04-28 ENCOUNTER — Encounter: Payer: Self-pay | Admitting: Family Medicine

## 2021-04-28 VITALS — Ht 70.0 in | Wt 144.0 lb

## 2021-04-28 DIAGNOSIS — M75101 Unspecified rotator cuff tear or rupture of right shoulder, not specified as traumatic: Secondary | ICD-10-CM | POA: Insufficient documentation

## 2021-04-28 DIAGNOSIS — M75121 Complete rotator cuff tear or rupture of right shoulder, not specified as traumatic: Secondary | ICD-10-CM

## 2021-04-28 DIAGNOSIS — I2510 Atherosclerotic heart disease of native coronary artery without angina pectoris: Secondary | ICD-10-CM

## 2021-04-28 DIAGNOSIS — M25511 Pain in right shoulder: Secondary | ICD-10-CM

## 2021-04-28 NOTE — Assessment & Plan Note (Signed)
Patient on MRI does show that patient does have a right rotator cuff tear.  No significant atrophy noted of the musculature but does have 2.6 cm retraction noted.  Discussed with patient about the possibility of surgical intervention.  Patient is 87 years old but is significantly active.  Do feel that he would be a candidate if necessary.  Patient would like to try the PRP injections first to see if patient does respond.  Patient will be set up for later today for the injections.  Follow-up with me again otherwise 5 to 6 weeks afterwards.  Total time reviewing patient's MRI as well as discussing with patient 31 minutes

## 2021-04-28 NOTE — Assessment & Plan Note (Signed)
Patient given PRP injection today.  Tolerated the procedure well.  Patient given post PRP instructions over the course the next 6 weeks.  We did discuss with patient at great length secondary to the severity of the tear but there is still a chance that surgical intervention may be necessary.  We will see patient again in 4 weeks and with ultrasound at that point to see how patient is doing.

## 2021-04-28 NOTE — Patient Instructions (Signed)
Good to see you! See you again in 4 weeks

## 2021-04-28 NOTE — Progress Notes (Signed)
[name] [name] [address] [place] Phone: [phone] Subjective:   [name] [name], am serving as a scribe for Dr. Hulan Saas. This visit occurred during the SARS-CoV-2 public health emergency.  Safety protocols were in place, including screening questions prior to the visit, additional usage of staff PPE, and extensive cleaning of exam room while observing appropriate contact time as indicated for disinfecting solutions.   I'm seeing this patient by the request  of:  [name], MD  CC: right shoulder pain   [id number]  04/07/2021 Patient does have the recurrent inflammation of noted of the shoulder.  Concerned that this may be coming in deeper in the shoulder.  Concern for labral pathology as well.  Do feel at this point that advanced imaging is warranted with this effusion coming back so quickly.  Patient is having pain that is affecting daily activities as well as waking him up at night.  Patient will come back after imaging to discuss further.  Update 04/28/2021 [name] [name] is a 87 y.o. male coming in with complaint of R shoulder pain. PRP today.  MRI R shoulder 04/26/2021 Full thickness tear of SST with 2.6cm of retraction, moderate AC arthritis and tendonosis of the rest of the rotator cuff        Past Medical History:  Diagnosis Date   Anxiety    CAD (coronary artery disease)    a. s/p stent to LAD 2008;  b. abnormal ETT 11/13 with VTach => LHC pLAD 30% ISR, mCFX 30%, dRCA 60-75% which had progressed from previous study in 2010 => FFR of RCA 0.92 => med Rx    Carotid stenosis    dopplers 1/14:  0-39% bilateral ICA stenosis   Colon polyps    Colon polyps    adenomatous   Diverticular disease of colon    Hemorrhoids    external and internal   History of BPH    HTN (hypertension)    Hx of echocardiogram    a. Echo 9/11:  EF 55-60%, normal motion, grade 2 diastolic dysfunction, mild MR, mild LAE, mild RAE,  PASP 33.   Hyperlipidemia    IBS (irritable bowel syndrome)    Rhinitis    Stroke, lacunar ([place]) 08/24/2012   Old, 2011 head mri   Past Surgical History:  Procedure Laterality Date   APPENDECTOMY  1956   CORONARY STENT PLACEMENT  2008    Successful PCI of the lesion in the proximal LAD using a Promus    HERNIA REPAIR Right 2005   NASAL SINUS SURGERY  1975   PERCUTANEOUS CORONARY STENT INTERVENTION (PCI-S) N/A 05/03/2012   Procedure: PERCUTANEOUS CORONARY STENT INTERVENTION (PCI-S);  Surgeon: Sherren Mocha, MD;  Location[facility] C[facility] LAB;  Service: Cardiovascular;  Laterality: N/A;   Promus DES ok for 3T MRI  2008   Social History   Socioeconomic History   Marital status: Married    Spouse name: Not on file   Number of children: 3   Years of education: 16   Highest education level: Not on file  Occupational History   Occupation: businessman, 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RETIRED  Tobacco Use   Smoking status: Never   Smokeless tobacco: Never  Vaping Use   Vaping Use: Never used  Substance and Sexual Activity   Alcohol use: Not Currently    Comment: 4-6 drinks/week   Drug use: No   Sexual activity: Yes    Partners: Female  Other Topics Concern   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 He has two daughters, 1 in [place] 1 in [place] (Dec '12) and a son who works with him. Several grandchildren.Reports that he spends a good deal of time at his home in [facility] which he greatly enjoys and his home in the [place]. Enjoys driving his BMW convertible when in the mountains.      ACP - Yes CPR, yes for short-term mechanical ventilation for reversible disease. Recommended [email or web address] for consideration.   Social Determinants of Health   Financial Resource Strain: Low Risk    Difficulty of Paying Living Expenses: Not hard  at all  Food Insecurity: No Food Insecurity   Worried About Charity fundraiser in the Last Year: Never true   [place] in the Last Year: Never true  Transportation Needs: No Transportation Needs   Lack of Transportation (Medical): No   Lack of Transportation (Non-Medical): No  Physical Activity: Sufficiently Active   Days of Exercise per Week: 5 days   Minutes of Exercise per Session: 30 min  Stress: No Stress Concern Present   Feeling of Stress : Not at all  Social Connections: Socially Integrated   Frequency of Communication with Friends and Family: More than three times a week   Frequency of Social Gatherings with Friends and Family: More than three times a week   Attends Religious Services: More than 4 times per year   Active Member of [employer] or Organizations: Yes   Attends Music therapist: More than 4 times per year   Marital Status: Married   No Known Allergies Family History  Problem Relation Age of Onset   Coronary artery disease Father    Heart attack Father    Rheum arthritis Mother    Cancer Brother        ? TYPE   Colon cancer Neg Hx    Stomach cancer Neg Hx    Pancreatic cancer Neg Hx     Current Outpatient Medications (Endocrine & Metabolic):    predniSONE (DELTASONE) 50 MG tablet, Take 1 tablet (50 mg total) by mouth daily.  Current Outpatient Medications (Cardiovascular):    lisinopril (ZESTRIL) 10 MG tablet, Take 1 tablet (10 mg total) by mouth daily.   metoprolol succinate (TOPROL-XL) 50 MG 24 hr tablet, Take 1 tablet (50 mg total) by mouth daily.   rosuvastatin (CRESTOR) 40 MG tablet, Take 1 tablet (40 mg total) by mouth daily.     Current Outpatient Medications (Other):    ALPRAZolam (XANAX) 0.25 MG tablet, Take 1 tablet (0.25 mg total) by mouth 2 (two) times daily as needed for anxiety.   dutasteride (AVODART) 0.5 MG capsule, Take 0.5 mg by mouth daily.   gabapentin (NEURONTIN) 100 MG capsule, TAKE 2 CAPSULES(200 MG) BY MOUTH AT  BEDTIME   hydrocortisone 2.5 % cream, Apply 1 application topically 2 (two) times daily.   Polyethylene Glycol 3350 (MIRALAX PO), Take by mouth.   psyllium (METAMUCIL) 58.6 % packet, Take 1 packet by mouth daily.   tamsulosin (FLOMAX) 0.4 MG CAPS capsule, Take 0.4 mg by mouth daily.   traZODone (DESYREL) 50 MG tablet, TAKE 1/2 TO 1 TABLET BY MOUTH DAILY AT BEDTIME   zolpidem (AMBIEN) 10 MG tablet, TAKE 1 TABLET(10 MG) BY MOUTH AT BEDTIME AS NEEDED FOR SLEEP  Objective  Height 5\' 10"  (1.778 m), weight 144 lb (65.3 kg).   General: No apparent distress alert and oriented x3 mood and affect normal, dressed appropriately.  HEENT: Pupils equal, extraocular movements intact  Respiratory: Patient's speak in full sentences and does not appear short of breath  Cardiovascular: No lower extremity edema, non tender, no erythema  Procedure: Real-time Ultrasound Guided Injection of right glenohumeral joint with PRP Device: GE Logiq Q7  Ultrasound guided injection is preferred based studies that show increased duration, increased effect, greater accuracy, decreased procedural pain, increased response rate with ultrasound guided versus blind injection.  Verbal informed consent obtained.  Time-out conducted.  Noted no overlying erythema, induration, or other signs of local infection.  Skin prepped in a sterile fashion.  Local anesthesia: Topical Ethyl chloride.  With sterile technique and under real time ultrasound guidance: With a 21-gauge 2 inch needle patient was injected with 0.5 cc of 0.5% Marcaine from a lateral approach.  Then injected with 5 cc of 3 centrifuge PRP.  This was mostly as much as we could in the subacromial space and somewhat in the supraspinatus tendon. Completed without difficulty  Pain immediately resolved suggesting accurate placement of the medication.  Advised to call if fevers/chills, erythema, induration, drainage, or persistent bleeding.  Impression: Technically  successful ultrasound guided injection.    Impression and Recommendations:     The above documentation has been reviewed and is accurate and complete Lyndal Pulley, DO

## 2021-05-01 ENCOUNTER — Other Ambulatory Visit: Payer: Self-pay | Admitting: Family Medicine

## 2021-05-07 ENCOUNTER — Other Ambulatory Visit: Payer: Self-pay | Admitting: Cardiovascular Disease

## 2021-05-19 DIAGNOSIS — R3915 Urgency of urination: Secondary | ICD-10-CM | POA: Diagnosis not present

## 2021-05-22 NOTE — Progress Notes (Signed)
Zach Arrianna [facility]a [place] [address] [place] Phone: [phone] Subjective:   [name], am serving as a scribe for Dr. Hulan Saas. This visit occurred during the SARS-CoV-2 public health emergency.  Safety protocols were in place, including screening questions prior to the visit, additional usage of staff PPE, and extensive cleaning of exam room while observing appropriate contact time as indicated for disinfecting solutions.   I'm seeing this patient by the request  of:  [name], MD  CC: Right shoulder follow-up, left hip pain  [id number]  04/28/2021 Patient given PRP injection today.  Tolerated the procedure well.  Patient given post PRP instructions over the course the next 6 weeks.  We did discuss with patient at great length secondary to the severity of the tear but there is still a chance that surgical intervention may be necessary.  We will see patient again in 4 weeks and with ultrasound at that point to see how patient is doing.  Update 05/26/2021 [name] [name] is a 88 y.o. male coming in with complaint of R shoulder pain. Patient states shoulder is doing much better. Increased ROM and less pain. Able to sleep better at night. Patient is happy with the results.  Patient is wondering if another PRP injection will get him more improvement.     Past Medical History:  Diagnosis Date   Anxiety    CAD (coronary artery disease)    a. s/p stent to LAD 2008;  b. abnormal ETT 11/13 with VTach => LHC pLAD 30% ISR, mCFX 30%, dRCA 60-75% which had progressed from previous study in 2010 => FFR of RCA 0.92 => med Rx    Carotid stenosis    dopplers 1/14:  0-39% bilateral ICA stenosis   Colon polyps    Colon polyps    adenomatous   Diverticular disease of colon    Hemorrhoids    external and internal   History of BPH    HTN (hypertension)    Hx of echocardiogram    a. Echo 9/11:  EF 55-60%, normal motion, grade 2 diastolic  dysfunction, mild MR, mild LAE, mild RAE, PASP 33.   Hyperlipidemia    IBS (irritable bowel syndrome)    Rhinitis    Stroke, lacunar ([place]) 08/24/2012   Old, 2011 head mri   Past Surgical History:  Procedure Laterality Date   APPENDECTOMY  1956   CORONARY STENT PLACEMENT  2008    Successful PCI of the lesion in the proximal LAD using a Promus    HERNIA REPAIR Right 2005   NASAL SINUS SURGERY  1975   PERCUTANEOUS CORONARY STENT INTERVENTION (PCI-S) N/A 05/03/2012   Procedure: PERCUTANEOUS CORONARY STENT INTERVENTION (PCI-S);  Surgeon: Sherren Mocha, MD;  Location: [facility] CATH LAB;  Service: Cardiovascular;  Laterality: N/A;   Promus DES ok for 3T MRI  2008   Social History   Socioeconomic History   Marital status: Married    Spouse name: Not on file   Number of children: 3   Years of education: 16   Highest education level: Not on file  Occupational History   Occupation: businessman, 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RETIRED  Tobacco Use   Smoking status: Never   Smokeless tobacco: Never  Vaping Use   Vaping Use: Never used  Substance and Sexual Activity   Alcohol use: Not Currently    Comment: 4-6 drinks/week   Drug use: No   Sexual activity: Yes  Partners: Female  Other Topics Concern   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 He has two daughters, 1 in [place] 1 in [place] (Dec '12) and a son who works with him. Several grandchildren.Reports that he spends a good deal of time at his home in [place] which he greatly enjoys and his home in the [place]. Enjoys driving his BMW convertible when in the mountains.      ACP - Yes CPR, yes for short-term mechanical ventilation for reversible disease. Recommended [email or web address] for consideration.   Social Determinants of Health   Financial Resource Strain: Low Risk     Difficulty of Paying Living Expenses: Not hard at all  Food Insecurity: No Food Insecurity   Worried About Charity fundraiser in the Last Year: Never true   [place] in the Last Year: Never true  Transportation Needs: No Transportation Needs   Lack of Transportation (Medical): No   Lack of Transportation (Non-Medical): No  Physical Activity: Sufficiently Active   Days of Exercise per Week: 5 days   Minutes of Exercise per Session: 30 min  Stress: No Stress Concern Present   Feeling of Stress : Not at all  Social Connections: Socially Integrated   Frequency of Communication with Friends and Family: More than three times a week   Frequency of Social Gatherings with Friends and Family: More than three times a week   Attends Religious Services: More than 4 times per year   Active Member of [employer] or Organizations: Yes   Attends Music therapist: More than 4 times per year   Marital Status: Married   No Known Allergies Family History  Problem Relation Age of Onset   Coronary artery disease Father    Heart attack Father    Rheum arthritis Mother    Cancer Brother        ? TYPE   Colon cancer Neg Hx    Stomach cancer Neg Hx    Pancreatic cancer Neg Hx     Current Outpatient Medications (Endocrine & Metabolic):    predniSONE (DELTASONE) 50 MG tablet, Take 1 tablet (50 mg total) by mouth daily.  Current Outpatient Medications (Cardiovascular):    lisinopril (ZESTRIL) 10 MG tablet, TAKE 1 TABLET(10 MG) BY MOUTH DAILY   metoprolol succinate (TOPROL-XL) 50 MG 24 hr tablet, Take 1 tablet (50 mg total) by mouth daily.   rosuvastatin (CRESTOR) 40 MG tablet, Take 1 tablet (40 mg total) by mouth daily.     Current Outpatient Medications (Other):    ALPRAZolam (XANAX) 0.25 MG tablet, Take 1 tablet (0.25 mg total) by mouth 2 (two) times daily as needed for anxiety.   dutasteride (AVODART) 0.5 MG capsule, Take 0.5 mg by mouth daily.   gabapentin (NEURONTIN) 100 MG  capsule, TAKE 2 CAPSULES(200 MG) BY MOUTH AT BEDTIME   hydrocortisone 2.5 % cream, Apply 1 application topically 2 (two) times daily.   Polyethylene Glycol 3350 (MIRALAX PO), Take by mouth.   psyllium (METAMUCIL) 58.6 % packet, Take 1 packet by mouth daily.   tamsulosin (FLOMAX) 0.4 MG CAPS capsule, Take 0.4 mg by mouth daily.   traZODone (DESYREL) 50 MG tablet, TAKE 1/2 TO 1 TABLET BY MOUTH DAILY AT BEDTIME   zolpidem (AMBIEN) 10 MG tablet, TAKE 1 TABLET(10 MG) BY MOUTH AT BEDTIME AS NEEDED FOR SLEEP  Reviewed prior external information including notes and imaging from  primary care provider As well as notes that were available from care everywhere and other healthcare systems.  Past medical history, social, surgical and family history all reviewed in electronic medical record.  No pertanent information unless stated regarding to the chief complaint.   Review of Systems:  No headache, visual changes, nausea, vomiting, diarrhea, constipation, dizziness, abdominal pain, skin rash, fevers, chills, night sweats, weight loss, swollen lymph nodes, body aches, joint swelling, chest pain, shortness of breath, mood changes. POSITIVE muscle aches  Objective  Blood pressure 132/76, pulse 88, height 5\' 10"  (1.778 m), weight 147 lb (66.7 kg), SpO2 96 %.   General: No apparent distress alert and oriented x3 mood and affect normal, dressed appropriately.  HEENT: Pupils equal, extraocular movements intact  Respiratory: Patient's speak in full sentences and does not appear short of breath  Cardiovascular: No lower extremity edema, non tender, no erythema  Gait normal with good balance and coordination.  MSK: Patient's right shoulder does have good range of motion noted.  Still mild weakness compared to the contralateral side but significant improvement from previous exam. Patient right hip still has tenderness to palpation more over the gluteal tendon on the left side.  Limited muscular skeletal  ultrasound was performed and interpreted by Hulan Saas, M  Limited ultrasound of patient's shoulder shows the patient's right shoulder still has hypoechoic changes within the bicep tendon as well as somewhat Around the supraspinatus.  Patient does have new scar tissue formation noted. Impression: Interval improvement   Impression and Recommendations:     The above documentation has been reviewed and is accurate and complete Lyndal Pulley, DO

## 2021-05-26 ENCOUNTER — Ambulatory Visit (INDEPENDENT_AMBULATORY_CARE_PROVIDER_SITE_OTHER): Payer: Medicare Other | Admitting: Family Medicine

## 2021-05-26 ENCOUNTER — Ambulatory Visit: Payer: Self-pay

## 2021-05-26 ENCOUNTER — Encounter: Payer: Self-pay | Admitting: Family Medicine

## 2021-05-26 ENCOUNTER — Other Ambulatory Visit: Payer: Self-pay

## 2021-05-26 VITALS — BP 132/76 | HR 88 | Ht 70.0 in | Wt 147.0 lb

## 2021-05-26 DIAGNOSIS — M75121 Complete rotator cuff tear or rupture of right shoulder, not specified as traumatic: Secondary | ICD-10-CM | POA: Diagnosis not present

## 2021-05-26 DIAGNOSIS — S76019A Strain of muscle, fascia and tendon of unspecified hip, initial encounter: Secondary | ICD-10-CM

## 2021-05-26 NOTE — Assessment & Plan Note (Signed)
Patient has responded.  Does have improvement in range of motion already.  On ultrasound he still has some hypoechoic changes that I think is contributing at the moment.  We discussed different treatment options and patient would like to try another PRP.  We will set him up again in 2 to 3 weeks to see.  Can split it with his hip and we will do more of the gluteal tear as well.

## 2021-05-26 NOTE — Patient Instructions (Signed)
PRP 2-3 weeks Shoulder and left hip  See you again in 2-3 weeks

## 2021-05-26 NOTE — Assessment & Plan Note (Signed)
Patient has a tear noted as well.  We will consider PRP at follow-up.  I do think that we could do 3 cc of the PRP in this area and 3 cc in the rotator cuff and see if we can make significant improvement.  Follow-up with me again at that time

## 2021-06-09 NOTE — Progress Notes (Signed)
[name] [name] [address] [place] Phone: [phone] Subjective:   [name] [name], am serving as a scribe for Dr. Hulan Saas. This visit occurred during the SARS-CoV-2 public health emergency.  Safety protocols were in place, including screening questions prior to the visit, additional usage of staff PPE, and extensive cleaning of exam room while observing appropriate contact time as indicated for disinfecting solutions.   I'm seeing this patient by the request  of:  [name], MD  CC: Right shoulder pain and left gluteal/hip pain  [id number]  05/26/2021 Patient has a tear noted as well.  We will consider PRP at follow-up.  I do think that we could do 3 cc of the PRP in this area and 3 cc in the rotator cuff and see if we can make significant improvement.  Follow-up with me again at that time  Patient has responded.  Does have improvement in range of motion already.  On ultrasound he still has some hypoechoic changes that I think is contributing at the moment.  We discussed different treatment options and patient would like to try another PRP.  We will set him up again in 2 to 3 weeks to see.  Can split it with his hip and we will do more of the gluteal tear as well.  Update 06/10/2021 [name] [name] is a 88 y.o. male coming in with complaint of R shoulder and glute pain. Patient states doing well.  Patient states that continues to have the discomfort.  Does have the rotator cuff tear and wants to avoid any surgical intervention so is here for second round of PRP.       Past Medical History:  Diagnosis Date   Anxiety    CAD (coronary artery disease)    a. s/p stent to LAD 2008;  b. abnormal ETT 11/13 with VTach => LHC pLAD 30% ISR, mCFX 30%, dRCA 60-75% which had progressed from previous study in 2010 => FFR of RCA 0.92 => med Rx    Carotid stenosis    dopplers 1/14:  0-39% bilateral ICA stenosis   Colon polyps    Colon  polyps    adenomatous   Diverticular disease of colon    Hemorrhoids    external and internal   History of BPH    HTN (hypertension)    Hx of echocardiogram    a. Echo 9/11:  EF 55-60%, normal motion, grade 2 diastolic dysfunction, mild MR, mild LAE, mild RAE, PASP 33.   Hyperlipidemia    IBS (irritable bowel syndrome)    Rhinitis    Stroke, lacunar ([place]) 08/24/2012   Old, 2011 head mri   Past Surgical History:  Procedure Laterality Date   APPENDECTOMY  1956   CORONARY STENT PLACEMENT  2008    Successful PCI of the lesion in the proximal LAD using a Promus    HERNIA REPAIR Right 2005   NASAL SINUS SURGERY  1975   PERCUTANEOUS CORONARY STENT INTERVENTION (PCI-S) N/A 05/03/2012   Procedure: PERCUTANEOUS CORONARY STENT INTERVENTION (PCI-S);  Surgeon: Sherren Mocha, MD;  Location: [facility] CATH LAB;  Service: Cardiovascular;  Laterality: N/A;   Promus DES ok for 3T MRI  2008   Social History   Socioeconomic History   Marital status: Married    Spouse name: Not on file   Number of children: 3   Years of education: 16   Highest education level: Not on file  Occupational History  Occupation: businessman, 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RETIRED  Tobacco Use   Smoking status: Never   Smokeless tobacco: Never  Vaping Use   Vaping Use: Never used  Substance and Sexual Activity   Alcohol use: Not Currently    Comment: 4-6 drinks/week   Drug use: No   Sexual activity: Yes    Partners: Female  Other Topics Concern   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 He has two daughters, 1 in [place] 1 in [place] (Dec '12) and a son who works with him. Several grandchildren.Reports that he spends a good deal of time at his home in [place] which he greatly enjoys and his home in the [place]. Enjoys driving his BMW convertible when in the mountains.       ACP - Yes CPR, yes for short-term mechanical ventilation for reversible disease. Recommended [email or web address] for consideration.   Social Determinants of Health   Financial Resource Strain: Low Risk    Difficulty of Paying Living Expenses: Not hard at all  Food Insecurity: No Food Insecurity   Worried About Charity fundraiser in the Last Year: Never true   [place] in the Last Year: Never true  Transportation Needs: No Transportation Needs   Lack of Transportation (Medical): No   Lack of Transportation (Non-Medical): No  Physical Activity: Sufficiently Active   Days of Exercise per Week: 5 days   Minutes of Exercise per Session: 30 min  Stress: No Stress Concern Present   Feeling of Stress : Not at all  Social Connections: Socially Integrated   Frequency of Communication with Friends and Family: More than three times a week   Frequency of Social Gatherings with Friends and Family: More than three times a week   Attends Religious Services: More than 4 times per year   Active Member of [employer] or Organizations: Yes   Attends Music therapist: More than 4 times per year   Marital Status: Married   No Known Allergies Family History  Problem Relation Age of Onset   Coronary artery disease Father    Heart attack Father    Rheum arthritis Mother    Cancer Brother        ? TYPE   Colon cancer Neg Hx    Stomach cancer Neg Hx    Pancreatic cancer Neg Hx     Current Outpatient Medications (Endocrine & Metabolic):    predniSONE (DELTASONE) 50 MG tablet, Take 1 tablet (50 mg total) by mouth daily.  Current Outpatient Medications (Cardiovascular):    lisinopril (ZESTRIL) 10 MG tablet, TAKE 1 TABLET(10 MG) BY MOUTH DAILY   metoprolol succinate (TOPROL-XL) 50 MG 24 hr tablet, Take 1 tablet (50 mg total) by mouth daily.   rosuvastatin (CRESTOR) 40 MG tablet, Take 1 tablet (40 mg total) by mouth daily.     Current Outpatient Medications (Other):     ALPRAZolam (XANAX) 0.25 MG tablet, Take 1 tablet (0.25 mg total) by mouth 2 (two) times daily as needed for anxiety.   dutasteride (AVODART) 0.5 MG capsule, Take 0.5 mg by mouth daily.   gabapentin (NEURONTIN) 100 MG capsule, TAKE 2 CAPSULES(200 MG) BY MOUTH AT BEDTIME   hydrocortisone 2.5 % cream, Apply 1 application topically 2 (two) times daily.   Polyethylene Glycol 3350 (MIRALAX PO), Take by mouth.   psyllium (  METAMUCIL) 58.6 % packet, Take 1 packet by mouth daily.   tamsulosin (FLOMAX) 0.4 MG CAPS capsule, Take 0.4 mg by mouth daily.   traZODone (DESYREL) 50 MG tablet, TAKE 1/2 TO 1 TABLET BY MOUTH DAILY AT BEDTIME   zolpidem (AMBIEN) 10 MG tablet, TAKE 1 TABLET(10 MG) BY MOUTH AT BEDTIME AS NEEDED FOR SLEEP   Reviewed prior external information including notes and imaging from  primary care provider As well as notes that were available from care everywhere and other healthcare systems.  Past medical history, social, surgical and family history all reviewed in electronic medical record.  No pertanent information unless stated regarding to the chief complaint.   Review of Systems:  No headache, visual changes, nausea, vomiting, diarrhea, constipation, dizziness, abdominal pain, skin rash, fevers, chills, night sweats, weight loss, swollen lymph nodes, body aches, joint swelling, chest pain, shortness of breath, mood changes. POSITIVE muscle aches  Objective  Blood pressure 122/84, pulse 72, height 5\' 10"  (1.778 m), weight 146 lb (66.2 kg), SpO2 96 %.   General: No apparent distress alert and oriented x3 mood and affect normal, dressed appropriately.   Procedure: Real-time Ultrasound Guided Injection of right rotator cuff tendon sheath Device: GE Logiq E  Ultrasound guided injection is preferred based studies that show increased duration, increased effect, greater accuracy, decreased procedural pain, increased response rate with ultrasound guided versus blind injection.  Verbal  informed consent obtained.  Time-out conducted.  Noted no overlying erythema, induration, or other signs of local infection.  Skin prepped in a sterile fashion.  Local anesthesia: Topical Ethyl chloride.  With sterile technique and under real time ultrasound guidance:  Joint visualized.  21g 2 inch needle inserted lateral approach. Pictures taken for needle placement. Patient did have injection of 0.5 cc of 0.5% Marcaine, and 3cc of previously centrifuged PRP Completed without difficulty  Impression: Technically successful ultrasound guided injection.  Procedure: Real-time Ultrasound Guided Injection of left gluteal tendon sheath Device: GE Logiq Q7 Ultrasound guided injection is preferred based studies that show increased duration, increased effect, greater accuracy, decreased procedural pain, increased response rate, and decreased cost with ultrasound guided versus blind injection.  Verbal informed consent obtained.  Time-out conducted.  Noted no overlying erythema, induration, or other signs of local infection.  Skin prepped in a sterile fashion.  Local anesthesia: Topical Ethyl chloride.  With sterile technique and under real time ultrasound guidance: With a 21-gauge 2 inch needle injected with 0.5 cc of 0.5% Marcaine then injected with 3 cc of PRP. Completed without difficulty  Advised to call if fevers/chills, erythema, induration, drainage, or persistent bleeding.  Impression: Technically successful ultrasound guided injection.   Impression and Recommendations:     The above documentation has been reviewed and is accurate and complete Lyndal Pulley, DO

## 2021-06-10 ENCOUNTER — Ambulatory Visit: Payer: Self-pay

## 2021-06-10 ENCOUNTER — Other Ambulatory Visit: Payer: Self-pay

## 2021-06-10 ENCOUNTER — Ambulatory Visit: Payer: Self-pay | Admitting: Family Medicine

## 2021-06-10 VITALS — BP 122/84 | HR 72 | Ht 70.0 in | Wt 146.0 lb

## 2021-06-10 DIAGNOSIS — M75121 Complete rotator cuff tear or rupture of right shoulder, not specified as traumatic: Secondary | ICD-10-CM

## 2021-06-10 DIAGNOSIS — M19011 Primary osteoarthritis, right shoulder: Secondary | ICD-10-CM

## 2021-06-10 DIAGNOSIS — S76019A Strain of muscle, fascia and tendon of unspecified hip, initial encounter: Secondary | ICD-10-CM

## 2021-06-10 NOTE — Patient Instructions (Addendum)
PRP today No ice or Ibuprofen for 3 days See you again in 4-6 weeks

## 2021-06-10 NOTE — Assessment & Plan Note (Signed)
Repeat PRP given again today.  Patient hopefully will do relatively well with the conservative therapy.  Discussed icing regimen and home exercises.  Increase activity slowly.  Follow-up again in 6 to 8 weeks

## 2021-06-10 NOTE — Assessment & Plan Note (Signed)
Patient given injection and tolerated the procedure well with PRP.  Post PRP injection protocol given after this.  Follow-up with me again in 6 weeks

## 2021-06-15 ENCOUNTER — Other Ambulatory Visit: Payer: Self-pay | Admitting: Internal Medicine

## 2021-06-15 DIAGNOSIS — F5104 Psychophysiologic insomnia: Secondary | ICD-10-CM

## 2021-06-16 DIAGNOSIS — R3915 Urgency of urination: Secondary | ICD-10-CM | POA: Diagnosis not present

## 2021-06-21 DIAGNOSIS — Z20822 Contact with and (suspected) exposure to covid-19: Secondary | ICD-10-CM | POA: Diagnosis not present

## 2021-07-01 ENCOUNTER — Other Ambulatory Visit: Payer: Self-pay | Admitting: Family Medicine

## 2021-07-06 ENCOUNTER — Ambulatory Visit: Payer: Medicare Other | Admitting: Cardiovascular Disease

## 2021-07-14 DIAGNOSIS — R3915 Urgency of urination: Secondary | ICD-10-CM | POA: Diagnosis not present

## 2021-07-14 NOTE — Progress Notes (Signed)
?Charlann Boxer D.O. ?[facility] ?[place] ?Phone: [phone] ?Subjective:   ?I, [name] [name], am serving as a Education administrator for Dr. Hulan Saas. ?This visit occurred during the SARS-CoV-2 public health emergency.  Safety protocols were in place, including screening questions prior to the visit, additional usage of staff PPE, and extensive cleaning of exam room while observing appropriate contact time as indicated for disinfecting solutions.  ? ?I'm seeing this patient by the request  of:  [name], MD ? ?CC: Right shoulder and left gluteal pain ? ?OAC:[id number]  ?06/10/2021 ?Repeat PRP given again today.  Patient hopefully will do relatively well with the conservative therapy.  Discussed icing regimen and home exercises.  Increase activity slowly.  Follow-up again in 6 to 8 weeks ? ?Patient given injection and tolerated the procedure well with PRP.  Post PRP injection protocol given after this.  Follow-up with me again in 6 weeks ? ?Update 07/15/2021 ?[name] [name] is a 88 y.o. male coming in with complaint of R AC arthritis and L glute tendon pain. PRP given for glute tendon last visit. Patient states doing great. Intensity of pain has decreased in hip and shoulder. Still some pain in right arm.  Patient states that has made significant improvement on the shoulder at the moment.  Patient has been able to increase activity.  The wound has no pain at all and but states that he is 100% better.  Feels like he does have more of a tightness in the shoulder on the anterior aspect. ? ? ?  ? ?Past Medical History:  ?Diagnosis Date  ? Anxiety   ? CAD (coronary artery disease)   ? a. s/p stent to LAD 2008;  b. abnormal ETT 11/13 with VTach => LHC pLAD 30% ISR, mCFX 30%, dRCA 60-75% which had progressed from previous study in 2010 => FFR of RCA 0.92 => med Rx   ? Carotid stenosis   ? dopplers 1/14:  0-39% bilateral ICA stenosis  ? Colon polyps   ? Colon polyps   ? adenomatous  ?  Diverticular disease of colon   ? Hemorrhoids   ? external and internal  ? History of BPH   ? HTN (hypertension)   ? Hx of echocardiogram   ? a. Echo 9/11:  EF 55-60%, normal motion, grade 2 diastolic dysfunction, mild MR, mild LAE, mild RAE, PASP 33.  ? Hyperlipidemia   ? IBS (irritable bowel syndrome)   ? Rhinitis   ? Stroke, lacunar ([place]) 08/24/2012  ? Old, 2011 head mri  ? ?Past Surgical History:  ?Procedure Laterality Date  ? APPENDECTOMY  1956  ? CORONARY STENT PLACEMENT  2008  ?  Successful PCI of the lesion in the proximal LAD using a Promus   ? HERNIA REPAIR Right 2005  ? NASAL SINUS SURGERY  1975  ? PERCUTANEOUS CORONARY STENT INTERVENTION (PCI-S) N/A 05/03/2012  ? Procedure: PERCUTANEOUS CORONARY STENT INTERVENTION (PCI-S);  Surgeon: Sherren Mocha, MD;  Location: [facility] CATH LAB;  Service: Cardiovascular;  Laterality: N/A;  ? Promus DES ok for 3T MRI  2008  ? ?Social History  ? ?Socioeconomic History  ? Marital status: Married  ?  Spouse name: Not on file  ? Number of children: 3  ? Years of education: 10  ? Highest education level: Not on file  ?Occupational History  ? Occupation: businessman, Engineer, structural  ?  Employer: RETIRED  ?Tobacco Use  ? Smoking status: Never  ?  Smokeless tobacco: Never  ?Vaping Use  ? Vaping Use: Never used  ?Substance and Sexual Activity  ? Alcohol use: Not Currently  ?  Comment: 4-6 drinks/week  ? Drug use: No  ? Sexual activity: Yes  ?  Partners: Female  ?Other Topics Concern  ? Not on file  ?Social History Narrative  ? 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 He has two daughters, 1 in [place] 1 in [place] (Dec '12) and a son who works with him. Several grandchildren.Reports that he spends a good deal of time at his home in [place] which he greatly enjoys and his home in the [place]. Enjoys driving his BMW convertible when in the mountains.  ?   ? ACP - Yes CPR, yes  for short-term mechanical ventilation for reversible disease. Recommended [email or web address] for consideration.  ? ?Social Determinants of Health  ? ?Financial Resource Strain: Low Risk   ? Difficulty of Paying Living Expenses: Not hard at all  ?Food Insecurity: No Food Insecurity  ? Worried About Charity fundraiser in the Last Year: Never true  ? Ran Out of Food in the Last Year: Never true  ?Transportation Needs: No Transportation Needs  ? Lack of Transportation (Medical): No  ? Lack of Transportation (Non-Medical): No  ?Physical Activity: Sufficiently Active  ? Days of Exercise per Week: 5 days  ? Minutes of Exercise per Session: 30 min  ?Stress: No Stress Concern Present  ? Feeling of Stress : Not at all  ?Social Connections: Socially Integrated  ? Frequency of Communication with Friends and Family: More than three times a week  ? Frequency of Social Gatherings with Friends and Family: More than three times a week  ? Attends Religious Services: More than 4 times per year  ? Active Member of Clubs or Organizations: Yes  ? Attends Archivist Meetings: More than 4 times per year  ? Marital Status: Married  ? ?No Known Allergies ?Family History  ?Problem Relation Age of Onset  ? Coronary artery disease Father   ? Heart attack Father   ? Rheum arthritis Mother   ? Cancer Brother   ?     ? TYPE  ? Colon cancer Neg Hx   ? Stomach cancer Neg Hx   ? Pancreatic cancer Neg Hx   ? ? ?Current Outpatient Medications (Endocrine & Metabolic):  ?  predniSONE (DELTASONE) 50 MG tablet, Take 1 tablet (50 mg total) by mouth daily. ? ?Current Outpatient Medications (Cardiovascular):  ?  lisinopril (ZESTRIL) 10 MG tablet, TAKE 1 TABLET(10 MG) BY MOUTH DAILY ?  metoprolol succinate (TOPROL-XL) 50 MG 24 hr tablet, Take 1 tablet (50 mg total) by mouth daily. ?  rosuvastatin (CRESTOR) 40 MG tablet, Take 1 tablet (40 mg total) by mouth daily. ? ? ? ? ?Current Outpatient Medications (Other):  ?  ALPRAZolam (XANAX) 0.25 MG  tablet, Take 1 tablet (0.25 mg total) by mouth 2 (two) times daily as needed for anxiety. ?  dutasteride (AVODART) 0.5 MG capsule, Take 0.5 mg by mouth daily. ?  gabapentin (NEURONTIN) 100 MG capsule, TAKE 2 CAPSULES(200 MG) BY MOUTH AT BEDTIME ?  hydrocortisone 2.5 % cream, Apply 1 application topically 2 (two) times daily. ?  Polyethylene Glycol 3350 (MIRALAX PO), Take by mouth. ?  psyllium (METAMUCIL) 58.6 % packet, Take 1 packet by mouth daily. ?  tamsulosin (FLOMAX) 0.4 MG CAPS capsule, Take 0.4  mg by mouth daily. ?  traZODone (DESYREL) 50 MG tablet, TAKE 1/2 TO 1 TABLET BY MOUTH DAILY AT BEDTIME ?  zolpidem (AMBIEN) 10 MG tablet, TAKE 1 TABLET(10 MG) BY MOUTH AT BEDTIME AS NEEDED FOR SLEEP ? ? ? ? ?Objective  ?Blood pressure 118/76, pulse 85, height 5\' 10"  (1.778 m), weight 145 lb (65.8 kg), SpO2 96 %. ?  ?General: No apparent distress alert and oriented x3 mood and affect normal, dressed appropriately.  ?HEENT: Pupils equal, extraocular movements intact  ?Respiratory: Patient's speak in full sentences and does not appear short of breath  ?Cardiovascular: No lower extremity edema, non tender, no erythema  ?Gait significant improvement in gait ?Left hip is nontender with great range of motion at the moment. ?Right shoulder shows significant improvement in range of motion and rotator cuff strength is 4+ out of 5 which is also an improvement. ? ? ?Limited muscular skeletal ultrasound was performed and interpreted by Hulan Saas, M  ?Limited ultrasound of patient's right shoulder shows the patient does have hypoechoic changes noted of the anterior aspect in the bicep tendon sheath but no acute tear associated.  Patient does have a subacromial in the subscapular bursa sacs being flamed as well.  No new tearing.  Patient does have regeneration of the peers of the tendon of the supraspinatus. ?Impression: Interval improvement of the rotator cuff with reactive bursitis of the shoulder. ?  ?Impression and  Recommendations:  ?  ? ?The above documentation has been reviewed and is accurate and complete Lyndal Pulley, DO ? ? ? ?

## 2021-07-15 ENCOUNTER — Ambulatory Visit (INDEPENDENT_AMBULATORY_CARE_PROVIDER_SITE_OTHER): Payer: Medicare Other | Admitting: Family Medicine

## 2021-07-15 ENCOUNTER — Other Ambulatory Visit: Payer: Self-pay

## 2021-07-15 ENCOUNTER — Ambulatory Visit: Payer: Self-pay

## 2021-07-15 VITALS — BP 118/76 | HR 85 | Ht 70.0 in | Wt 145.0 lb

## 2021-07-15 DIAGNOSIS — M75121 Complete rotator cuff tear or rupture of right shoulder, not specified as traumatic: Secondary | ICD-10-CM | POA: Diagnosis not present

## 2021-07-15 DIAGNOSIS — S76019A Strain of muscle, fascia and tendon of unspecified hip, initial encounter: Secondary | ICD-10-CM

## 2021-07-15 NOTE — Patient Instructions (Signed)
Good to see you! ?Looks amazing ?Consider arm compression sleeve with lots of activity ?Ice after activity ?See you again in 6-7 weeks, may drain if swelling ?

## 2021-07-15 NOTE — Assessment & Plan Note (Signed)
Patient has made remarkable improvement at this time.  There is what seems to be more of a post reactive bursitis noted.  And seems to be mostly of the bicep tendon, anterior labrum as well as near the subacromial bursa.  Patient wants to hold on any injection or aspiration at this time.  Discussed compression and icing.  Hopefully patient will make significant improvement still.  Patient's low strength has made significant improvement and the supraspinatus appears to be significantly improving.  Follow-up with me 1 more time in 6 weeks to make sure he does well otherwise we will consider aspiration of the swelling in the shoulder. ?

## 2021-07-15 NOTE — Assessment & Plan Note (Signed)
Patient is 100% better with increasing strength noted as well.  No change in management. ?

## 2021-07-21 ENCOUNTER — Telehealth: Payer: Self-pay | Admitting: Family Medicine

## 2021-07-21 ENCOUNTER — Other Ambulatory Visit: Payer: Self-pay

## 2021-07-21 DIAGNOSIS — M75121 Complete rotator cuff tear or rupture of right shoulder, not specified as traumatic: Secondary | ICD-10-CM

## 2021-07-21 NOTE — Telephone Encounter (Signed)
Patient states a referral to [facility] for PT was discussed at last visit. He would like to proceed with referral, I do not see this ordered. ?

## 2021-07-21 NOTE — Telephone Encounter (Signed)
Referral placed and patient notified.  

## 2021-07-22 DIAGNOSIS — M25511 Pain in right shoulder: Secondary | ICD-10-CM | POA: Diagnosis not present

## 2021-07-22 DIAGNOSIS — M7521 Bicipital tendinitis, right shoulder: Secondary | ICD-10-CM | POA: Diagnosis not present

## 2021-07-27 DIAGNOSIS — L814 Other melanin hyperpigmentation: Secondary | ICD-10-CM | POA: Diagnosis not present

## 2021-07-27 DIAGNOSIS — Z85828 Personal history of other malignant neoplasm of skin: Secondary | ICD-10-CM | POA: Diagnosis not present

## 2021-07-27 DIAGNOSIS — D1801 Hemangioma of skin and subcutaneous tissue: Secondary | ICD-10-CM | POA: Diagnosis not present

## 2021-07-27 DIAGNOSIS — Z85820 Personal history of malignant melanoma of skin: Secondary | ICD-10-CM | POA: Diagnosis not present

## 2021-07-27 DIAGNOSIS — L821 Other seborrheic keratosis: Secondary | ICD-10-CM | POA: Diagnosis not present

## 2021-07-27 DIAGNOSIS — L570 Actinic keratosis: Secondary | ICD-10-CM | POA: Diagnosis not present

## 2021-07-30 DIAGNOSIS — M7521 Bicipital tendinitis, right shoulder: Secondary | ICD-10-CM | POA: Diagnosis not present

## 2021-07-30 DIAGNOSIS — M25511 Pain in right shoulder: Secondary | ICD-10-CM | POA: Diagnosis not present

## 2021-07-31 ENCOUNTER — Other Ambulatory Visit: Payer: Self-pay | Admitting: Family Medicine

## 2021-08-06 ENCOUNTER — Encounter: Payer: Self-pay | Admitting: Cardiovascular Disease

## 2021-08-06 DIAGNOSIS — M7521 Bicipital tendinitis, right shoulder: Secondary | ICD-10-CM | POA: Diagnosis not present

## 2021-08-06 DIAGNOSIS — M25511 Pain in right shoulder: Secondary | ICD-10-CM | POA: Diagnosis not present

## 2021-08-07 DIAGNOSIS — R3915 Urgency of urination: Secondary | ICD-10-CM | POA: Diagnosis not present

## 2021-08-25 NOTE — Progress Notes (Signed)
?[name] [name] D.O. ?[facility] ?[place] ?Phone: [phone] ?Subjective:   ?I, [name] [name], am serving as a scribe for Dr. Hulan Saas. ? ?This visit occurred during the SARS-CoV-2 public health emergency.  Safety protocols were in place, including screening questions prior to the visit, additional usage of staff PPE, and extensive cleaning of exam room while observing appropriate contact time as indicated for disinfecting solutions.  ? ? ?I'm seeing this patient by the request  of:  [name], MD ? ?CC: Right shoulder pain follow-up ? ?LOV:[id number] 3 ?07/15/2021 ?Patient has made remarkable improvement at this time.  There is what seems to be more of a post reactive bursitis noted.  And seems to be mostly of the bicep tendon, anterior labrum as well as near the subacromial bursa.  Patient wants to hold on any injection or aspiration at this time.  Discussed compression and icing.  Hopefully patient will make significant improvement still.  Patient's low strength has made significant improvement and the supraspinatus appears to be significantly improving.  Follow-up with me 1 more time in 6 weeks to make sure he does well otherwise we will consider aspiration of the swelling in the shoulder. ? ?Update 08/26/2021 ?[name] [name] is a 88 y.o. male coming in with complaint of R shoulder pain. Patient states that he has little pain. Patient has been going to PT. Feels that he is not as strong in R arm.  Patient states that he can do most in daily activities.  Certain range of motion such as touching behind his back or putting a coat on it can be difficult. ? ?L hip continues after sitting for a while. Pain over GT. Is considering PRP injection if advisable.  ? ? ?  ? ?Past Medical History:  ?Diagnosis Date  ? Anxiety   ? CAD (coronary artery disease)   ? a. s/p stent to LAD 2008;  b. abnormal ETT 11/13 with VTach => LHC pLAD 30% ISR, mCFX 30%, dRCA 60-75% which had  progressed from previous study in 2010 => FFR of RCA 0.92 => med Rx   ? Carotid stenosis   ? dopplers 1/14:  0-39% bilateral ICA stenosis  ? Colon polyps   ? Colon polyps   ? adenomatous  ? Diverticular disease of colon   ? Hemorrhoids   ? external and internal  ? History of BPH   ? HTN (hypertension)   ? Hx of echocardiogram   ? a. Echo 9/11:  EF 55-60%, normal motion, grade 2 diastolic dysfunction, mild MR, mild LAE, mild RAE, PASP 33.  ? Hyperlipidemia   ? IBS (irritable bowel syndrome)   ? Rhinitis   ? Stroke, lacunar ([place]) 08/24/2012  ? Old, 2011 head mri  ? ?Past Surgical History:  ?Procedure Laterality Date  ? APPENDECTOMY  1956  ? CORONARY STENT PLACEMENT  2008  ?  Successful PCI of the lesion in the proximal LAD using a Promus   ? HERNIA REPAIR Right 2005  ? NASAL SINUS SURGERY  1975  ? PERCUTANEOUS CORONARY STENT INTERVENTION (PCI-S) N/A 05/03/2012  ? Procedure: PERCUTANEOUS CORONARY STENT INTERVENTION (PCI-S);  Surgeon: Sherren Mocha, MD;  Location: [facility] CATH LAB;  Service: Cardiovascular;  Laterality: N/A;  ? Promus DES ok for 3T MRI  2008  ? ?Social History  ? ?Socioeconomic History  ? Marital status: Married  ?  Spouse name: Not on file  ? Number of children: 3  ? Years of  education: 16  ? Highest education level: Not on file  ?Occupational History  ? Occupation: businessman, Engineer, structural  ?  Employer: RETIRED  ?Tobacco Use  ? Smoking status: Never  ? Smokeless tobacco: Never  ?Vaping Use  ? Vaping Use: Never used  ?Substance and Sexual Activity  ? Alcohol use: Not Currently  ?  Comment: 4-6 drinks/week  ? Drug use: No  ? Sexual activity: Yes  ?  Partners: Female  ?Other Topics Concern  ? Not on file  ?Social History Narrative  ? 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 He has two daughters, 1 in [place] 1 in [place] (Dec '12) and a son who works with him. Several grandchildren.Reports that he spends  a good deal of time at his home in [place] which he greatly enjoys and his home in the [place]. Enjoys driving his BMW convertible when in the mountains.  ?   ? ACP - Yes CPR, yes for short-term mechanical ventilation for reversible disease. Recommended [email or web address] for consideration.  ? ?Social Determinants of Health  ? ?Financial Resource Strain: Low Risk   ? Difficulty of Paying Living Expenses: Not hard at all  ?Food Insecurity: No Food Insecurity  ? Worried About Charity fundraiser in the Last Year: Never true  ? Ran Out of Food in the Last Year: Never true  ?Transportation Needs: No Transportation Needs  ? Lack of Transportation (Medical): No  ? Lack of Transportation (Non-Medical): No  ?Physical Activity: Sufficiently Active  ? Days of Exercise per Week: 5 days  ? Minutes of Exercise per Session: 30 min  ?Stress: No Stress Concern Present  ? Feeling of Stress : Not at all  ?Social Connections: Socially Integrated  ? Frequency of Communication with Friends and Family: More than three times a week  ? Frequency of Social Gatherings with Friends and Family: More than three times a week  ? Attends Religious Services: More than 4 times per year  ? Active Member of Clubs or Organizations: Yes  ? Attends Archivist Meetings: More than 4 times per year  ? Marital Status: Married  ? ?No Known Allergies ?Family History  ?Problem Relation Age of Onset  ? Coronary artery disease Father   ? Heart attack Father   ? Rheum arthritis Mother   ? Cancer Brother   ?     ? TYPE  ? Colon cancer Neg Hx   ? Stomach cancer Neg Hx   ? Pancreatic cancer Neg Hx   ? ? ?Current Outpatient Medications (Endocrine & Metabolic):  ?  predniSONE (DELTASONE) 50 MG tablet, Take 1 tablet (50 mg total) by mouth daily. ? ?Current Outpatient Medications (Cardiovascular):  ?  lisinopril (ZESTRIL) 10 MG tablet, TAKE 1 TABLET(10 MG) BY MOUTH DAILY ?  metoprolol succinate (TOPROL-XL) 50 MG 24 hr tablet, Take 1 tablet (50 mg  total) by mouth daily. ?  rosuvastatin (CRESTOR) 40 MG tablet, Take 1 tablet (40 mg total) by mouth daily. ? ? ? ? ?Current Outpatient Medications (Other):  ?  ALPRAZolam (XANAX) 0.25 MG tablet, Take 1 tablet (0.25 mg total) by mouth 2 (two) times daily as needed for anxiety. ?  dutasteride (AVODART) 0.5 MG capsule, Take 0.5 mg by mouth daily. ?  gabapentin (NEURONTIN) 100 MG capsule, TAKE 2 CAPSULES(200 MG) BY MOUTH AT BEDTIME ?  hydrocortisone 2.5 % cream, Apply 1 application topically 2 (two)  times daily. ?  Polyethylene Glycol 3350 (MIRALAX PO), Take by mouth. ?  psyllium (METAMUCIL) 58.6 % packet, Take 1 packet by mouth daily. ?  tamsulosin (FLOMAX) 0.4 MG CAPS capsule, Take 0.4 mg by mouth daily. ?  traZODone (DESYREL) 50 MG tablet, TAKE 1/2 TO 1 TABLET BY MOUTH DAILY AT BEDTIME ?  zolpidem (AMBIEN) 10 MG tablet, TAKE 1 TABLET(10 MG) BY MOUTH AT BEDTIME AS NEEDED FOR SLEEP ? ? ?Reviewed prior external information including notes and imaging from  ?primary care provider ?As well as notes that were available from care everywhere and other healthcare systems. ? ?Past medical history, social, surgical and family history all reviewed in electronic medical record.  No pertanent information unless stated regarding to the chief complaint.  ? ?Review of Systems: ? No headache, visual changes, nausea, vomiting, diarrhea, constipation, dizziness, abdominal pain, skin rash, fevers, chills, night sweats, weight loss, swollen lymph nodes, body aches, joint swelling, chest pain, shortness of breath, mood changes. POSITIVE muscle aches ? ?Objective  ?Blood pressure 128/60, pulse 71, height '5\' 10"'$  (1.778 m), weight 143 lb (64.9 kg), SpO2 95 %. ?  ?General: No apparent distress alert and oriented x3 mood and affect normal, dressed appropriately.  ?HEENT: Pupils equal, extraocular movements intact  ?Respiratory: Patient's speak in full sentences and does not appear short of breath  ?Cardiovascular: No lower extremity edema, non  tender, no erythema  ?Gait normal with good balance and coordination.  ?MSK: Patient's right shoulder still has some weakness noted of the rotator cuff.  This seems to be 5 strength.  Patient continues

## 2021-08-26 ENCOUNTER — Ambulatory Visit: Payer: Self-pay

## 2021-08-26 ENCOUNTER — Ambulatory Visit (INDEPENDENT_AMBULATORY_CARE_PROVIDER_SITE_OTHER): Payer: Medicare Other | Admitting: Family Medicine

## 2021-08-26 ENCOUNTER — Encounter: Payer: Self-pay | Admitting: Family Medicine

## 2021-08-26 VITALS — BP 128/60 | HR 71 | Ht 70.0 in | Wt 143.0 lb

## 2021-08-26 DIAGNOSIS — M25511 Pain in right shoulder: Secondary | ICD-10-CM

## 2021-08-26 DIAGNOSIS — M75121 Complete rotator cuff tear or rupture of right shoulder, not specified as traumatic: Secondary | ICD-10-CM | POA: Diagnosis not present

## 2021-08-26 DIAGNOSIS — M19011 Primary osteoarthritis, right shoulder: Secondary | ICD-10-CM

## 2021-08-26 NOTE — Patient Instructions (Signed)
Injected AC joint today ?See me again in 2 months ?

## 2021-08-26 NOTE — Assessment & Plan Note (Signed)
Tear still noted with retraction.  Patient does want to avoid any type of surgical intervention.  We will continue to monitor closely.  Follow-up again in 6 to 8 weeks otherwise. ?

## 2021-08-26 NOTE — Assessment & Plan Note (Signed)
Chronic problem with exacerbation.  Hopefully patient responds well.  Discussed icing regimen and home exercises, which activities to do and which ones to avoid.  Increase activity slowly.  Patient does seem to have some mild improvement of the rotator cuff but I do think provided patient will likely get arthritic changes at some point.  Due to patient's age though patient states that he does not want any surgical intervention follow-up with me again in 2 months ?

## 2021-09-01 DIAGNOSIS — R3915 Urgency of urination: Secondary | ICD-10-CM | POA: Diagnosis not present

## 2021-09-03 ENCOUNTER — Other Ambulatory Visit: Payer: Self-pay | Admitting: Family Medicine

## 2021-09-15 NOTE — Progress Notes (Deleted)
Patient ID: [name] [name], male   DOB: [birth year], 88 y.o.   MRN: [id number]     86 y.o. 88 y.o. history of CAD. Stent to LAD in 2008.  ETT in 2013 abnormal with NSVT. Cath 05/01/12 30% ISR LaD and distal RCA 60-75% with FFR CT .92 Rx medically Myovue done 04/22/20 normal.perfusion EF 53%   EF normal echo 06/25/15 55-60% LA only mildly dilated    Has Occular migraines Been going on for over 3-4 years  Seen by neurology And MRI/MRA no abnormality and EEG no epileptic foci  01/11/14  1-39% bilateral carotid disease   Has lower lumbar disc issue and bilateral hip pain Has had repeat injection by Dr [name] Julian  03/2020 Went to [place] to visit his cousin [name] [name] who is a famous Engineer, agricultural in [place] has a hypochondriac for husband Daughter in [place] not working as [name] but he likes her husband better Son works with him and got married for first time at age 66 to pharmacist at [employer] a tract of land on [name] to build a [name] [name]   Had a syncopal episode July  4th. 2022 Started with visual changes then BP low  80's with pre syncope  Felt weak for a couple hous. Had two drinks prior Not very hot out Occurred around 6 o'clock No  Chest pain, dyspnea Went to sleep and felt fine with no recurrence   Monitor:  12/29/20 showed no PAF but 8% burden atrial ectopy with self limited runs of SVT   Continues to have hip pain left > right MRI  moderate L34 disc bulging Had injection of right AC joint most recently 08/26/21   ***  ROS: Denies fever, malais, weight loss, blurry vision, decreased visual acuity, cough, sputum, SOB, hemoptysis, pleuritic pain, palpitaitons, heartburn, abdominal pain, melena, lower extremity edema, claudication, or rash.  All other systems reviewed and negative  General: There were no vitals taken for this visit. Affect appropriate Healthy:  appears stated age 88: normal Neck supple with no adenopathy JVP normal no bruits no thyromegaly Lungs clear with no  wheezing and good diaphragmatic motion Heart:  S1/S2 no murmur, no rub, gallop or click PMI normal Abdomen: benighn, BS positve, no tenderness, no AAA no bruit.  No HSM or HJR Distal pulses intact with no bruits No edema Neuro non-focal Skin warm and dry No muscular weakness     Current Outpatient Medications  Medication Sig Dispense Refill   ALPRAZolam (XANAX) 0.25 MG tablet Take 1 tablet (0.25 mg total) by mouth 2 (two) times daily as needed for anxiety. 60 tablet 3   dutasteride (AVODART) 0.5 MG capsule Take 0.5 mg by mouth daily.     gabapentin (NEURONTIN) 100 MG capsule TAKE 2 CAPSULES(200 MG) BY MOUTH AT BEDTIME 180 capsule 0   hydrocortisone 2.5 % cream Apply 1 application topically 2 (two) times daily.     lisinopril (ZESTRIL) 10 MG tablet TAKE 1 TABLET(10 MG) BY MOUTH DAILY 90 tablet 2   metoprolol succinate (TOPROL-XL) 50 MG 24 hr tablet Take 1 tablet (50 mg total) by mouth daily. 90 tablet 3   Polyethylene Glycol 3350 (MIRALAX PO) Take by mouth.     predniSONE (DELTASONE) 50 MG tablet Take 1 tablet (50 mg total) by mouth daily. 5 tablet 0   psyllium (METAMUCIL) 58.6 % packet Take 1 packet by mouth daily.     rosuvastatin (CRESTOR) 40 MG tablet Take 1 tablet (40 mg total) by mouth daily. 90 tablet  2   tamsulosin (FLOMAX) 0.4 MG CAPS capsule Take 0.4 mg by mouth daily.     traZODone (DESYREL) 50 MG tablet TAKE 1/2 TO 1 TABLET BY MOUTH DAILY AT BEDTIME 30 tablet 1   zolpidem (AMBIEN) 10 MG tablet TAKE 1 TABLET(10 MG) BY MOUTH AT BEDTIME AS NEEDED FOR SLEEP 90 tablet 0   No current facility-administered medications for this visit.    Allergies  Patient has no known allergies.  Electrocardiogram:   06/15/17 SR rate 77 RBBB  09/15/2021 SR PAC/PVC RBBB   Assessment and Plan  CAD: Stable with no angina and good activity level.  Continue medical Rx Stent LAD 2008  Moderate distal RCA with normal flow wire 04/2012  Normal non ischemic myovue 04/22/20 continue medical Rx   HLD  : on generic crestor now  Lab Results  Component Value Date   LDLCALC 46 10/20/2020    GERD:with constipation not helped with Miralax, ducolox or Linzess f/u Dr Satira Anis:  Chronic on ambien f/u Dr Ronnald Ramp   Visual Disturbance:  MRI/MRA normal EEG normal has been seen by neurology   Ortho:  f/u Dr [name] Julian for bilateral hip pain post injections MRI 02/02/21 moderate L3-4 disc bulging   Syncope:   Normal myovue 04/22/20 Labs ok Monitor with mostly atrial ectopy continue beta blocker    F/U with me in 6 months   [name] [name]

## 2021-09-16 NOTE — Progress Notes (Signed)
Patient ID: [name] [name], male   DOB: [birth year], 88 y.o.   MRN: [id number]  ? ? ? ?88 y.o. history of CAD. Stent to LAD in 2008.  ETT in 2013 abnormal with NSVT. Cath 05/01/12 30% ISR LaD and distal RCA 60-75% with FFR CT .92 Rx medically Myovue done 04/27/17 reviewed and normal with no ischemia study not gated due to PAC;s EF normal echo 06/25/15 55-60% LA only mildly dilated Had another myovue 04/22/20 which was normal with no ischemia EF estimtated 53% ? ?Has Occular migraines Been going on for over 3-4 years  Seen by neurology And MRI/MRA no abnormality and EEG no epileptic foci ? ?01/11/14  1-39% bilateral carotid disease  ? ?Has lower lumbar disc issue and bilateral hip pain Has had repeat injection by Dr Tamala Julian Also with right [facility] joint arthritis most recent injection 09/06/21  ? ? ?03/2020 Went to [place] to visit his cousin [name] who is a famous Curator  ?Daughter in [place] has a hypochondriac for husband They moved onto my street at [facility] last year  ?Daughter in [place] not working as [employer] but he likes her husband better ?Son works with him and got married for first time at age 66 to pharmacist at [employer] ? ?Had a syncopal episode July  4th. 2022 Started with visual changes then BP low  80's with pre syncope  ?Felt weak for a couple hous. Had two drinks prior Not very hot out Occurred around 6 o'clock No  ?Chest pain, dyspnea Went to sleep and felt fine with no recurrence  ? ?Monitor:  showed no PAF but 8% burden atrial ectopy with self limited runs of SVT  ? ?Buying a tract of land on [name] to build a [employer]  ? ?Had a wonderful Viking Cruise to [place] and other ports with wife/son  ? ?ROS: Denies fever, malais, weight loss, blurry vision, decreased visual acuity, cough, sputum, SOB, hemoptysis, pleuritic pain, palpitaitons, heartburn, abdominal pain, melena, lower extremity edema, claudication, or rash.  All other systems reviewed and negative ? ?General: ?There were no vitals taken for  this visit. ?Affect appropriate ?Healthy:  appears stated age ?HEENT: normal ?Neck supple with no adenopathy ?JVP normal no bruits no thyromegaly ?Lungs clear with no wheezing and good diaphragmatic motion ?Heart:  S1/S2 no murmur, no rub, gallop or click ?PMI normal ?Abdomen: benighn, BS positve, no tenderness, no AAA ?no bruit.  No HSM or HJR ?Distal pulses intact with no bruits ?No edema ?Neuro non-focal ?Skin warm and dry ?No muscular weakness ? ? ? ? ?Current Outpatient Medications  ?Medication Sig Dispense Refill  ? ALPRAZolam (XANAX) 0.25 MG tablet Take 1 tablet (0.25 mg total) by mouth 2 (two) times daily as needed for anxiety. 60 tablet 3  ? dutasteride (AVODART) 0.5 MG capsule Take 0.5 mg by mouth daily.    ? gabapentin (NEURONTIN) 100 MG capsule TAKE 2 CAPSULES(200 MG) BY MOUTH AT BEDTIME 180 capsule 0  ? hydrocortisone 2.5 % cream Apply 1 application topically 2 (two) times daily.    ? lisinopril (ZESTRIL) 10 MG tablet TAKE 1 TABLET(10 MG) BY MOUTH DAILY 90 tablet 2  ? metoprolol succinate (TOPROL-XL) 50 MG 24 hr tablet Take 1 tablet (50 mg total) by mouth daily. 90 tablet 3  ? Polyethylene Glycol 3350 (MIRALAX PO) Take by mouth.    ? predniSONE (DELTASONE) 50 MG tablet Take 1 tablet (50 mg total) by mouth daily. 5 tablet 0  ? psyllium (METAMUCIL) 58.6 %  packet Take 1 packet by mouth daily.    ? rosuvastatin (CRESTOR) 40 MG tablet Take 1 tablet (40 mg total) by mouth daily. 90 tablet 2  ? tamsulosin (FLOMAX) 0.4 MG CAPS capsule Take 0.4 mg by mouth daily.    ? traZODone (DESYREL) 50 MG tablet TAKE 1/2 TO 1 TABLET BY MOUTH DAILY AT BEDTIME 30 tablet 1  ? zolpidem (AMBIEN) 10 MG tablet TAKE 1 TABLET(10 MG) BY MOUTH AT BEDTIME AS NEEDED FOR SLEEP 90 tablet 0  ? ?No current facility-administered medications for this visit.  ? ? ?Allergies ? ?Patient has no known allergies. ? ?Electrocardiogram:   06/15/17 SR rate 77 RBBB  09/16/2021 SR PAC/PVC RBBB  ? ?Assessment and Plan ? ?CAD: Stable with no angina and good  activity level.  Continue medical Rx Stent LAD 2008  Moderate distal RCA with normal flow wire 04/2012  Normal non ischemic myovue 04/22/20 continue medical Rx  ? ?HLD : on generic crestor now  ?Lab Results  ?Component Value Date  ? [place] 46 10/20/2020  ? ? ?GERD:with constipation not helped with Miralax, ducolox or Linzess f/u Dr Henrene Pastor ? ?Insmonia:  Chronic on ambien f/u Dr Ronnald Ramp  ? ?Visual Disturbance:  MRI/MRA normal EEG normal has been seen by neurology  ? ?Ortho:  f/u Dr Tamala Julian for bilateral hip pain post injections MRI L3,4 disc bulge as well as right AC joint arthritis  ? ?Syncope:   Normal myovue 04/22/20 Labs ok Monitor with mostly atrial ectopy continue beta blocker  ? ? ?F/U with me in 6 months ? ? ?[name] ? ?

## 2021-09-17 ENCOUNTER — Ambulatory Visit (INDEPENDENT_AMBULATORY_CARE_PROVIDER_SITE_OTHER): Payer: Medicare Other | Admitting: Cardiovascular Disease

## 2021-09-17 ENCOUNTER — Encounter: Payer: Self-pay | Admitting: Cardiovascular Disease

## 2021-09-17 VITALS — BP 128/64 | HR 76 | Ht 70.0 in | Wt 140.8 lb

## 2021-09-17 DIAGNOSIS — I2510 Atherosclerotic heart disease of native coronary artery without angina pectoris: Secondary | ICD-10-CM

## 2021-09-17 DIAGNOSIS — E782 Mixed hyperlipidemia: Secondary | ICD-10-CM | POA: Diagnosis not present

## 2021-09-17 DIAGNOSIS — I493 Ventricular premature depolarization: Secondary | ICD-10-CM | POA: Diagnosis not present

## 2021-09-17 DIAGNOSIS — I491 Atrial premature depolarization: Secondary | ICD-10-CM

## 2021-09-17 NOTE — Patient Instructions (Signed)
Medication Instructions:  ?Your physician recommends that you continue on your current medications as directed. Please refer to the Current Medication list given to you today. ? ?*If you need a refill on your cardiac medications before your next appointment, please call your pharmacy* ? ? ?Lab Work: ?None ?If you have labs (blood work) drawn today and your tests are completely normal, you will receive your results only by: ?MyChart Message (if you have MyChart) OR ?A paper copy in the mail ?If you have any lab test that is abnormal or we need to change your treatment, we will call you to review the results. ? ? ?Follow-Up: ?At [facility], you and your health needs are our priority.  As part of our continuing mission to provide you with exceptional heart care, we have created designated Provider Care Teams.  These Care Teams include your primary Cardiologist (physician) and Advanced Practice Providers (APPs -  Physician Assistants and Nurse Practitioners) who all work together to provide you with the care you need, when you need it. ? ?Your next appointment:   ?6 month(s) ? ?The format for your next appointment:   ?In Person ? ?Provider:   ?[name], MD   ?  ?

## 2021-09-22 ENCOUNTER — Other Ambulatory Visit: Payer: Self-pay | Admitting: Internal Medicine

## 2021-09-22 DIAGNOSIS — F5104 Psychophysiologic insomnia: Secondary | ICD-10-CM

## 2021-09-28 ENCOUNTER — Ambulatory Visit: Payer: Medicare Other | Admitting: Cardiovascular Disease

## 2021-10-01 DIAGNOSIS — R3915 Urgency of urination: Secondary | ICD-10-CM | POA: Diagnosis not present

## 2021-10-19 ENCOUNTER — Encounter: Payer: Self-pay | Admitting: Internal Medicine

## 2021-10-19 ENCOUNTER — Ambulatory Visit (INDEPENDENT_AMBULATORY_CARE_PROVIDER_SITE_OTHER): Payer: Medicare Other | Admitting: Internal Medicine

## 2021-10-19 VITALS — BP 126/66 | HR 65 | Temp 98.1°F | Resp 16 | Ht 70.0 in | Wt 142.0 lb

## 2021-10-19 DIAGNOSIS — I2510 Atherosclerotic heart disease of native coronary artery without angina pectoris: Secondary | ICD-10-CM

## 2021-10-19 DIAGNOSIS — I10 Essential (primary) hypertension: Secondary | ICD-10-CM | POA: Diagnosis not present

## 2021-10-19 DIAGNOSIS — R197 Diarrhea, unspecified: Secondary | ICD-10-CM | POA: Diagnosis not present

## 2021-10-19 NOTE — Progress Notes (Signed)
Subjective:  Patient ID: [name] [name], male    DOB: 01/19/1933  Age: 88 y.o. MRN: [id number]  CC: Diarrhea   HPI [name] [name] presents for f/up -   He complains of a 44-monthhistory of diarrhea.  He has had a slight decrease in appetite but denies abdominal pain, nausea, vomiting, rash, bright red blood per rectum, or melena.  He is also had some urgency.  He gets some relief with Imodium.  He has about 3-4 stools a day.  Outpatient Medications Prior to Visit  Medication Sig Dispense Refill   ALPRAZolam (XANAX) 0.25 MG tablet Take 1 tablet (0.25 mg total) by mouth 2 (two) times daily as needed for anxiety. 60 tablet 3   dutasteride (AVODART) 0.5 MG capsule Take 0.5 mg by mouth daily.     hydrocortisone 2.5 % cream Apply 1 application topically 2 (two) times daily.     lisinopril (ZESTRIL) 10 MG tablet TAKE 1 TABLET(10 MG) BY MOUTH DAILY 90 tablet 2   metoprolol succinate (TOPROL-XL) 50 MG 24 hr tablet Take 1 tablet (50 mg total) by mouth daily. 90 tablet 3   Polyethylene Glycol 3350 (MIRALAX PO) Take by mouth.     psyllium (METAMUCIL) 58.6 % packet Take 1 packet by mouth daily.     rosuvastatin (CRESTOR) 40 MG tablet Take 1 tablet (40 mg total) by mouth daily. 90 tablet 2   tamsulosin (FLOMAX) 0.4 MG CAPS capsule Take 0.4 mg by mouth daily.     traZODone (DESYREL) 50 MG tablet TAKE 1/2 TO 1 TABLET BY MOUTH DAILY AT BEDTIME 30 tablet 1   zolpidem (AMBIEN) 10 MG tablet TAKE 1 TABLET(10 MG) BY MOUTH AT BEDTIME AS NEEDED FOR SLEEP 90 tablet 0   gabapentin (NEURONTIN) 100 MG capsule TAKE 2 CAPSULES(200 MG) BY MOUTH AT BEDTIME 180 capsule 0   predniSONE (DELTASONE) 50 MG tablet Take 1 tablet (50 mg total) by mouth daily. 5 tablet 0   No facility-administered medications prior to visit.    ROS Review of Systems  Constitutional:  Positive for appetite change. Negative for diaphoresis, fatigue and unexpected weight change.  HENT: Negative.    Eyes: Negative.   Respiratory: Negative.   Negative for cough, chest tightness and wheezing.   Cardiovascular:  Negative for chest pain, palpitations and leg swelling.  Gastrointestinal:  Negative for abdominal pain, constipation, diarrhea, nausea and vomiting.  Endocrine: Negative.   Genitourinary: Negative.  Negative for difficulty urinating.  Musculoskeletal: Negative.   Skin: Negative.   Neurological:  Negative for dizziness, weakness, light-headedness and headaches.  Hematological:  Negative for adenopathy. Does not bruise/bleed easily.  Psychiatric/Behavioral: Negative.      Objective:  BP 126/66 (BP Location: Left Arm, Patient Position: Sitting, Cuff Size: Large)   Pulse 65   Temp 98.1 F (36.7 C) (Oral)   Resp 16   Ht '5\' 10"'$  (1.778 m)   Wt 142 lb (64.4 kg)   SpO2 95%   BMI 20.37 kg/m   BP Readings from Last 3 Encounters:  10/28/21 112/66  10/19/21 126/66  09/17/21 128/64    Wt Readings from Last 3 Encounters:  10/28/21 142 lb (64.4 kg)  10/19/21 142 lb (64.4 kg)  09/17/21 140 lb 12.8 oz (63.9 kg)    Physical Exam Vitals reviewed.  HENT:     Nose: Nose normal.     Mouth/Throat:     Mouth: Mucous membranes are moist.  Eyes:     General: No scleral icterus.  Conjunctiva/sclera: Conjunctivae normal.  Cardiovascular:     Rate and Rhythm: Normal rate and regular rhythm.     Heart sounds: No murmur heard. Pulmonary:     Effort: Pulmonary effort is normal.     Breath sounds: No stridor. No wheezing, rhonchi or rales.  Abdominal:     General: Abdomen is flat. Bowel sounds are normal. There is no distension.     Palpations: Abdomen is soft. There is no hepatomegaly, splenomegaly or mass.     Tenderness: There is no abdominal tenderness.  Musculoskeletal:        General: Normal range of motion.     Cervical back: Neck supple.     Right lower leg: No edema.     Left lower leg: No edema.  Lymphadenopathy:     Cervical: No cervical adenopathy.  Skin:    General: Skin is warm and dry.     Findings:  No rash.  Neurological:     General: No focal deficit present.     Mental Status: He is alert.     Lab Results  Component Value Date   WBC 5.4 10/21/2021   HGB 13.6 10/21/2021   HCT 40.4 10/21/2021   PLT 182.0 10/21/2021   GLUCOSE 96 10/21/2021   CHOL 140 10/20/2020   TRIG 121.0 10/20/2020   HDL 69.50 10/20/2020   LDLDIRECT 132.2 03/10/2007   LDLCALC 46 10/20/2020   ALT 15 10/20/2020   AST 17 10/20/2020   NA 141 10/21/2021   K 4.2 10/21/2021   CL 109 10/21/2021   CREATININE 1.06 10/21/2021   BUN 16 10/21/2021   CO2 24 10/21/2021   TSH 2.88 10/21/2021   PSA 0.44 05/10/2011   INR 1.0 04/27/2012    MR SHOULDER RIGHT W WO CONTRAST  Result Date: 04/27/2021 CLINICAL DATA:  Shoulder pain, chronic, osteoarthritis suspected shoulder pain EXAM: MRI OF THE RIGHT SHOULDER WITHOUT AND WITH CONTRAST TECHNIQUE: Multiplanar, multisequence MR imaging of the RIGHT shoulder was performed before and after the administration of intravenous contrast. CONTRAST:  26m MULTIHANCE GADOBENATE DIMEGLUMINE 529 MG/ML IV SOLN COMPARISON:  None. FINDINGS: Rotator cuff: There is a full-thickness, full width tear of the supraspinatus tendon with at least 2.6 cm retraction. There is tendinosis and intermediate grade, partial width, articular sided tearing of the mid to posterior infraspinatus tendon at the footprint. Severe tendinosis of the subscapularis tendon with focal low-grade articular sided tearing of the cephalad fibers at the footprint. Muscles: No significant muscle atrophy. There is muscle edema within the supraspinatus and subscapularis, partially visualized. Biceps Long Head: Intact intra and extra-articular long head biceps tendon. Acromioclavicular Joint: Moderate arthropathy of the acromioclavicular joint, with joint effusion and pericapsular edema. Small amount of subacromial/subdeltoid bursal fluid related to the full-thickness cuff tear. Glenohumeral Joint: Moderate size joint effusion, a  significant amount of which collecting along the subscapularis recess, with synovial enhancement. Mild to moderate chondrosis. Labrum: Degenerative anterior superior labral tearing. Bones: No acute fracture or dislocation. Reactive bony edema along the greater tuberosity. No focal bone lesion. Other: No fluid collection or hematoma. IMPRESSION: Full-thickness, full width tear of the supraspinatus tendon with a least 2.6 cm retraction. Tendinosis and intermediate grade partial width articular sided tearing of the infraspinatus tendon at the footprint. Severe tendinosis of the distal subscapularis tendon with focal low-grade articular sided tearing of the cephalad fibers at the footprint. Mild to moderate glenohumeral arthritis with degenerative anterior superior labral tearing. Moderate AC joint arthropathy, with effusion and pericapsular edema. Moderate-sized joint  effusion. Electronically Signed   By: Maurine Simmering M.D.   On: 04/27/2021 09:13    Assessment & Plan:   Kollen was seen today for diarrhea.  Diagnoses and all orders for this visit:  Diarrhea of presumed infectious origin- Will check labs to evaluate for secondary causes. -     Tissue Transglutaminase Abs,IgG,IgA -     Clostridium difficile EIA; Future -     Giardia/cryptosporidium (EIA); Future -     Fecal lactoferrin, quant; Future -     Ova and parasite examination; Future -     CALPROTECTIN; Future -     C-reactive protein; Future -     TSH; Future -     Pancreatic elastase, fecal; Future  Essential hypertension- His blood pressure is well controlled. -     Basic metabolic panel; Future -     CBC with Differential/Platelet; Future -     TSH; Future   I have discontinued Macky Lower "Cooper"'s predniSONE and gabapentin. I am also having him maintain his ALPRAZolam, tamsulosin, psyllium, dutasteride, Polyethylene Glycol 3350 (MIRALAX PO), hydrocortisone, metoprolol succinate, rosuvastatin, lisinopril, traZODone, and  zolpidem.  No orders of the defined types were placed in this encounter.    Follow-up: No follow-ups on file.  Scarlette Calico, MD

## 2021-10-21 ENCOUNTER — Encounter: Payer: Self-pay | Admitting: Internal Medicine

## 2021-10-21 ENCOUNTER — Other Ambulatory Visit (INDEPENDENT_AMBULATORY_CARE_PROVIDER_SITE_OTHER): Payer: Medicare Other

## 2021-10-21 DIAGNOSIS — R197 Diarrhea, unspecified: Secondary | ICD-10-CM

## 2021-10-21 DIAGNOSIS — I10 Essential (primary) hypertension: Secondary | ICD-10-CM | POA: Diagnosis not present

## 2021-10-21 LAB — CBC WITH DIFFERENTIAL/PLATELET
Basophils Absolute: 0 10*3/uL (ref 0.0–0.1)
Basophils Relative: 0.4 % (ref 0.0–3.0)
Eosinophils Absolute: 0.1 10*3/uL (ref 0.0–0.7)
Eosinophils Relative: 2.4 % (ref 0.0–5.0)
HCT: 40.4 % (ref 39.0–52.0)
Hemoglobin: 13.6 g/dL (ref 13.0–17.0)
Lymphocytes Relative: 30.6 % (ref 12.0–46.0)
Lymphs Abs: 1.7 10*3/uL (ref 0.7–4.0)
MCHC: 33.6 g/dL (ref 30.0–36.0)
MCV: 91 fl (ref 78.0–100.0)
Monocytes Absolute: 0.4 10*3/uL (ref 0.1–1.0)
Monocytes Relative: 8.2 % (ref 3.0–12.0)
Neutro Abs: 3.2 10*3/uL (ref 1.4–7.7)
Neutrophils Relative %: 58.4 % (ref 43.0–77.0)
Platelets: 182 10*3/uL (ref 150.0–400.0)
RBC: 4.44 Mil/uL (ref 4.22–5.81)
RDW: 14.5 % (ref 11.5–15.5)
WBC: 5.4 10*3/uL (ref 4.0–10.5)

## 2021-10-21 LAB — BASIC METABOLIC PANEL
BUN: 16 mg/dL (ref 6–23)
CO2: 24 mEq/L (ref 19–32)
Calcium: 9.7 mg/dL (ref 8.4–10.5)
Chloride: 109 mEq/L (ref 96–112)
Creatinine, Ser: 1.06 mg/dL (ref 0.40–1.50)
GFR: 63.57 mL/min (ref >60.00)
Glucose, Bld: 96 mg/dL (ref 70–99)
Potassium: 4.2 mEq/L (ref 3.5–5.1)
Sodium: 141 mEq/L (ref 135–145)

## 2021-10-21 LAB — C-REACTIVE PROTEIN: CRP: <1 mg/dL (ref 0.5–20.0)

## 2021-10-21 LAB — TSH: TSH: 2.88 u[IU]/mL (ref 0.35–5.50)

## 2021-10-22 ENCOUNTER — Encounter: Payer: Self-pay | Admitting: Internal Medicine

## 2021-10-23 LAB — CLOSTRIDIUM DIFFICILE EIA: C difficile Toxins A+B, EIA: NEGATIVE

## 2021-10-27 DIAGNOSIS — R3915 Urgency of urination: Secondary | ICD-10-CM | POA: Diagnosis not present

## 2021-10-27 NOTE — Progress Notes (Unsigned)
[name] [name] [address] [name] Phone: [phone] Subjective:   IVilma [name], am serving as a scribe for Dr. Hulan Saas.  I'm seeing this patient by the request  of:  [name], MD  CC: Shoulder and hip pain follow-up  [id number]  08/26/2021 Tear still noted with retraction.  Patient does want to avoid any type of surgical intervention.  We will continue to monitor closely.  Follow-up again in 6 to 8 weeks otherwise.  Chronic problem with exacerbation.  Hopefully patient responds well.  Discussed icing regimen and home exercises, which activities to do and which ones to avoid.  Increase activity slowly.  Patient does seem to have some mild improvement of the rotator cuff but I do think provided patient will likely get arthritic changes at some point.  Due to patient's age though patient states that he does not want any surgical intervention follow-up with me again in 2 months  Update 10/28/2021 [name] [name] is a 88 y.o. male coming in with complaint of R shoulder pain. Patient states shoulder and hip doing well. Pain is few and far between with shoulder. Pain only occurs at night but is able to go back to sleep.  Would state that he is well over 90% better      Past Medical History:  Diagnosis Date   Anxiety    CAD (coronary artery disease)    a. s/p stent to LAD 2008;  b. abnormal ETT 11/13 with VTach => LHC pLAD 30% ISR, mCFX 30%, dRCA 60-75% which had progressed from previous study in 2010 => FFR of RCA 0.92 => med Rx    Carotid stenosis    dopplers 1/14:  0-39% bilateral ICA stenosis   Colon polyps    Colon polyps    adenomatous   Diverticular disease of colon    Hemorrhoids    external and internal   History of BPH    HTN (hypertension)    Hx of echocardiogram    a. Echo 9/11:  EF 55-60%, normal motion, grade 2 diastolic dysfunction, mild MR, mild LAE, mild RAE, PASP 33.   Hyperlipidemia    IBS (irritable  bowel syndrome)    Rhinitis    Stroke, lacunar ([place]) 08/24/2012   Old, 2011 head mri   Past Surgical History:  Procedure Laterality Date   APPENDECTOMY  1956   CORONARY STENT PLACEMENT  2008    Successful PCI of the lesion in the proximal LAD using a Promus    HERNIA REPAIR Right 2005   NASAL SINUS SURGERY  1975   PERCUTANEOUS CORONARY STENT INTERVENTION (PCI-S) N/A 05/03/2012   Procedure: PERCUTANEOUS CORONARY STENT INTERVENTION (PCI-S);  Surgeon: Sherren Mocha, MD;  Location: [facility] CATH LAB;  Service: Cardiovascular;  Laterality: N/A;   Promus DES ok for 3T MRI  2008   Social History   Socioeconomic History   Marital status: Married    Spouse name: Not on file   Number of children: 3   Years of education: 16   Highest education level: Not on file  Occupational History   Occupation: businessman, 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RETIRED  Tobacco Use   Smoking status: Never    Passive exposure: Never   Smokeless tobacco: Never  Vaping Use   Vaping Use: Never used  Substance and Sexual Activity   Alcohol use: Not Currently    Comment: 4-6 drinks/week   Drug use: No   Sexual  activity: Yes    Partners: Female  Other Topics Concern   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 He has two daughters, 1 in [place] 1 in [place] (Dec '12) and a son who works with him. Several grandchildren.Reports that he spends a good deal of time at his home in [place] which he greatly enjoys and his home in the [place]. Enjoys driving his BMW [place]ible when [place].      ACP - Yes CPR, yes for short-term mechanical ventilation for reversible disease. Recommended [employer] for consideration.   Social Determinants of Health   Financial Resource Strain: Low Risk  (11/11/2020)   Overall Financial Resource Strain (CARDIA)     Difficulty of Paying Living Expenses: Not hard at all  Food Insecurity: No Food Insecurity (11/11/2020)   Hunger Vital Sign    Worried About Running Out of Food in the Last Year: Never true    Ran Out of Food in the Last Year: Never true  Transportation Needs: No Transportation Needs (11/11/2020)   PRAPARE - Hydrologist (Medical): No    Lack of Transportation (Non-Medical): No  Physical Activity: Sufficiently Active (11/11/2020)   Exercise Vital Sign    Days of Exercise per Week: 5 days    Minutes of Exercise per Session: 30 min  Stress: No Stress Concern Present (11/11/2020)   [place]    Feeling of Stress : Not at all  Social Connections: [place] (11/11/2020)   Social Connection and Isolation Panel [NHANES]    Frequency of Communication with Friends and Family: More than three times a week    Frequency of Social Gatherings with Friends and Family: More than three times a week    Attends Religious Services: More than 4 times per year    Active Member of [employer] or Organizations: Yes    Attends Music therapist: More than 4 times per year    Marital Status: Married   No Known Allergies Family History  Problem Relation Age of Onset   Coronary artery disease Father    Heart attack Father    Rheum arthritis Mother    Cancer Brother        ? TYPE   Colon cancer Neg Hx    Stomach cancer Neg Hx    Pancreatic cancer Neg Hx      Current Outpatient Medications (Cardiovascular):    lisinopril (ZESTRIL) 10 MG tablet, TAKE 1 TABLET(10 MG) BY MOUTH DAILY   metoprolol succinate (TOPROL-XL) 50 MG 24 hr tablet, Take 1 tablet (50 mg total) by mouth daily.   rosuvastatin (CRESTOR) 40 MG tablet, Take 1 tablet (40 mg total) by mouth daily.     Current Outpatient Medications (Other):    ALPRAZolam (XANAX) 0.25 MG tablet, Take 1 tablet (0.25 mg total) by mouth 2 (two) times  daily as needed for anxiety.   dutasteride (AVODART) 0.5 MG capsule, Take 0.5 mg by mouth daily.   hydrocortisone 2.5 % cream, Apply 1 application topically 2 (two) times daily.   Polyethylene Glycol 3350 (MIRALAX PO), Take by mouth.   psyllium (METAMUCIL) 58.6 % packet, Take 1 packet by mouth daily.   tamsulosin (FLOMAX) 0.4 MG CAPS capsule, Take 0.4 mg by mouth daily.   traZODone (DESYREL) 50 MG tablet, TAKE 1/2 TO  1 TABLET BY MOUTH DAILY AT BEDTIME   zolpidem (AMBIEN) 10 MG tablet, TAKE 1 TABLET(10 MG) BY MOUTH AT BEDTIME AS NEEDED FOR SLEEP     Objective  Blood pressure 112/66, pulse 84, height '5\' 10"'$  (1.778 m), weight 142 lb (64.4 kg), SpO2 96 %.   General: No apparent distress alert and oriented x3 mood and affect normal, dressed appropriately.  HEENT: Pupils equal, extraocular movements intact  Respiratory: Patient's speak in full sentences and does not appear short of breath  Cardiovascular: No lower extremity edema, non tender, no erythema  Some arthritic changes in some joints.  Patient does have significant improvement in the strength of the rotator cuff with 5 out of 5 strength.  Has is just lacking the last 5 degrees of internal and external rotation noted. Hip minimal tenderness over the greater trochanteric area.   Impression and Recommendations:

## 2021-10-28 ENCOUNTER — Ambulatory Visit (INDEPENDENT_AMBULATORY_CARE_PROVIDER_SITE_OTHER): Payer: Medicare Other | Admitting: Family Medicine

## 2021-10-28 DIAGNOSIS — I2510 Atherosclerotic heart disease of native coronary artery without angina pectoris: Secondary | ICD-10-CM | POA: Diagnosis not present

## 2021-10-28 DIAGNOSIS — M75121 Complete rotator cuff tear or rupture of right shoulder, not specified as traumatic: Secondary | ICD-10-CM | POA: Diagnosis not present

## 2021-10-28 DIAGNOSIS — M7061 Trochanteric bursitis, right hip: Secondary | ICD-10-CM

## 2021-10-28 DIAGNOSIS — M7062 Trochanteric bursitis, left hip: Secondary | ICD-10-CM | POA: Diagnosis not present

## 2021-10-28 NOTE — Assessment & Plan Note (Addendum)
Patient has full range of motion at this moment. Is having no significant discomfort or pain at the moment.  Just some mild discomfort at night he states.  Discussed with patient about and home exercises, icing regimen, which activities to do and which ones to avoid, increase activity slowly.  No significant restrictions and follow-up with me as needed

## 2021-10-28 NOTE — Assessment & Plan Note (Signed)
Significant improvement as well.  Continuing to be active.  We will follow-up with me as needed

## 2021-11-02 ENCOUNTER — Encounter: Payer: Self-pay | Admitting: Internal Medicine

## 2021-11-02 DIAGNOSIS — H5000 Unspecified esotropia: Secondary | ICD-10-CM | POA: Diagnosis not present

## 2021-11-02 DIAGNOSIS — H2513 Age-related nuclear cataract, bilateral: Secondary | ICD-10-CM | POA: Diagnosis not present

## 2021-11-02 LAB — OVA AND PARASITE EXAMINATION: CONCENTRATE RESULT:: NONE SEEN

## 2021-11-02 LAB — GIARDIA AND CRYPTOSPORIDIUM ANTIGEN PANEL: Result:: NOT DETECTED

## 2021-11-02 LAB — FECAL LACTOFERRIN, QUANT: LACTOFERRIN, QL, STOOL: NEGATIVE

## 2021-11-02 LAB — CALPROTECTIN: Calprotectin: 19 mcg/g

## 2021-11-03 ENCOUNTER — Encounter: Payer: Self-pay | Admitting: Internal Medicine

## 2021-11-12 ENCOUNTER — Ambulatory Visit (INDEPENDENT_AMBULATORY_CARE_PROVIDER_SITE_OTHER): Payer: Medicare Other

## 2021-11-12 VITALS — Ht 70.0 in | Wt 137.0 lb

## 2021-11-12 DIAGNOSIS — Z0000 Encounter for general adult medical examination without abnormal findings: Secondary | ICD-10-CM | POA: Diagnosis not present

## 2021-11-12 NOTE — Progress Notes (Signed)
I connected with [name] today by telephone and verified that I am speaking with the correct person using two identifiers. Location patient: home Location provider: work Persons participating in the virtual visit: patient, provider.   I discussed the limitations, risks, security and privacy concerns of performing an evaluation and management service by telephone and the availability of in person appointments. I also discussed with the patient that there may be a patient responsible charge related to this service. The patient expressed understanding and verbally consented to this telephonic visit.    Interactive audio and video telecommunications were attempted between this provider and patient, however failed, due to patient having technical difficulties OR patient did not have access to video capability.  We continued and completed visit with audio only.  Some vital signs may be absent or patient reported.   Time Spent with patient on telephone encounter: 30 minutes  Subjective:   [name] [name] is a 88 y.o. male who presents for Medicare Annual/Subsequent preventive examination.  Review of Systems     Cardiac Risk Factors include: advanced age (>70mn, >>61women);dyslipidemia;family history of premature cardiovascular disease;hypertension;male gender     Objective:    Today's Vitals   11/12/21 1150  Weight: 137 lb (62.1 kg)  Height: '5\' 10"'$  (1.778 m)   Body mass index is 19.66 kg/m.     11/12/2021   11:57 AM 11/11/2020    8:48 AM 11/26/2019    3:20 PM 09/17/2019    8:20 AM 09/13/2018    2:31 PM 08/03/2017    3:22 PM 07/25/2016   11:49 AM  Advanced Directives  Does Patient Have a Medical Advance Directive? Yes Yes No Yes Yes Yes Yes  Type of AParamedicof [place]myrnaLiving will Living will;Healthcare Power of [place]Living will [place]ren[place]Living will HFairplayLiving will HSolomonsLiving will  Does patient want to make changes to medical advance directive? No - Patient declined No - Patient declined  No - Patient declined     Copy of [place]in Chart? No - copy requested No - copy requested  No - copy requested No - copy requested No - copy requested Yes  Would patient like information on creating a medical advance directive?   No - Patient declined        Current Medications (verified) Outpatient Encounter Medications as of 11/12/2021  Medication Sig   ALPRAZolam (XANAX) 0.25 MG tablet Take 1 tablet (0.25 mg total) by mouth 2 (two) times daily as needed for anxiety.   dutasteride (AVODART) 0.5 MG capsule Take 0.5 mg by mouth daily.   hydrocortisone 2.5 % cream Apply 1 application topically 2 (two) times daily.   lisinopril (ZESTRIL) 10 MG tablet TAKE 1 TABLET(10 MG) BY MOUTH DAILY   metoprolol succinate (TOPROL-XL) 50 MG 24 hr tablet Take 1 tablet (50 mg total) by mouth daily.   Polyethylene Glycol 3350 (MIRALAX PO) Take by mouth.   psyllium (METAMUCIL) 58.6 % packet Take 1 packet by mouth daily.   rosuvastatin (CRESTOR) 40 MG tablet Take 1 tablet (40 mg total) by mouth daily.   tamsulosin (FLOMAX) 0.4 MG CAPS capsule Take 0.4 mg by mouth daily.   traZODone (DESYREL) 50 MG tablet TAKE 1/2 TO 1 TABLET BY MOUTH DAILY AT BEDTIME   zolpidem (AMBIEN) 10 MG tablet TAKE 1 TABLET(10 MG) BY MOUTH AT BEDTIME AS NEEDED FOR SLEEP   No facility-administered encounter medications  on file as of 11/12/2021.    Allergies (verified) Patient has no known allergies.   History: Past Medical History:  Diagnosis Date   Anxiety    CAD (coronary artery disease)    a. s/p stent to LAD 2008;  b. abnormal ETT 11/13 with VTach => LHC pLAD 30% ISR, mCFX 30%, dRCA 60-75% which had progressed from previous study in 2010 => FFR of RCA 0.92 => med Rx    Carotid stenosis    dopplers 1/14:  0-39% bilateral ICA stenosis   Colon polyps    Colon polyps     adenomatous   Diverticular disease of colon    Hemorrhoids    external and internal   History of BPH    HTN (hypertension)    Hx of echocardiogram    a. Echo 9/11:  EF 55-60%, normal motion, grade 2 diastolic dysfunction, mild MR, mild LAE, mild RAE, PASP 33.   Hyperlipidemia    IBS (irritable bowel syndrome)    Rhinitis    Stroke, lacunar ([place]) 08/24/2012   Old, 2011 head mri   Past Surgical History:  Procedure Laterality Date   APPENDECTOMY  1956   CORONARY STENT PLACEMENT  2008    Successful PCI of the lesion in the proximal LAD using a Promus    HERNIA REPAIR Right 2005   NASAL SINUS SURGERY  1975   PERCUTANEOUS CORONARY STENT INTERVENTION (PCI-S) N/A 05/03/2012   Procedure: PERCUTANEOUS CORONARY STENT INTERVENTION (PCI-S);  Surgeon: Sherren Mocha, MD;  Location: [facility] CATH LAB;  Service: Cardiovascular;  Laterality: N/A;   Promus DES ok for 3T MRI  2008   Family History  Problem Relation Age of Onset   Coronary artery disease Father    Heart attack Father    Rheum arthritis Mother    Cancer Brother        ? 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RETIRED  Tobacco Use   Smoking status: Never    Passive exposure: Never   Smokeless tobacco: Never  Vaping Use   Vaping Use: Never used  Substance and Sexual Activity   Alcohol use: Not Currently    Comment: 4-6 drinks/week   Drug use: No   Sexual activity: Yes    Partners: Female  Other Topics Concern   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 He has two daughters, 1  in [place] 1 in [place] (Dec '12) and a son who works with him. Several grandchildren.Reports that he spends a good deal of time at his home in [facility] which he greatly enjoys and his home in the [place]. Enjoys driving his BMW [place] when [place].      ACP - Yes CPR, yes for short-term mechanical ventilation for reversible disease. Recommended [email or web address] for consideration.   Social Determinants of Health   Financial Resource Strain: Low Risk  (11/12/2021)   Overall Financial Resource Strain (CARDIA)    Difficulty of Paying Living Expenses:  Not hard at all  Food Insecurity: No Food Insecurity (11/12/2021)   Hunger Vital Sign    Worried About Running Out of Food in the Last Year: Never true    Ran Out of Food in the Last Year: Never true  Transportation Needs: No Transportation Needs (11/12/2021)   PRAPARE - Hydrologist (Medical): No    Lack of Transportation (Non-Medical): No  Physical Activity: Sufficiently Active (11/12/2021)   Exercise Vital Sign    Days of Exercise per Week: 5 days    Minutes of Exercise per Session: 30 min  Stress: No Stress Concern Present (11/12/2021)   [place]    Feeling of Stress : Not at all  Social Connections: [place] (11/12/2021)   Social Connection and Isolation Panel [NHANES]    Frequency of Communication with Friends and Family: More than three times a week    Frequency of Social Gatherings with Friends and Family: More than three times a week    Attends Religious Services: More than 4 times per year    Active Member of [employer] or Organizations: Yes    Attends Music therapist: More than 4 times per year    Marital Status: Married    Tobacco Counseling Counseling given: Not Answered   Clinical Intake:  Pre-visit preparation completed: Yes  Pain : No/denies pain     BMI - recorded:  19.66 Nutritional Status: BMI of 19-24  Normal Nutritional Risks: None Diabetes: No  How often do you need to have someone help you when you read instructions, pamphlets, or other written materials from your doctor or pharmacy?: 1 - Never What is the last grade level you completed in school?: Forensic psychologist; [employer]  Diabetic? no  Interpreter Needed?: No  Information entered by :: [employer], LPN.   Activities of Daily Living    11/12/2021   12:00 PM  In your present state of health, do you have any difficulty performing the following activities:  Hearing? 1  Comment Left ear  Vision? 0  Difficulty concentrating or making decisions? 0  Walking or climbing stairs? 0  Dressing or bathing? 0  Doing errands, shopping? 0  Preparing Food and eating ? N  Using the Toilet? N  In the past six months, have you accidently leaked urine? N  Do you have problems with loss of bowel control? N  Managing your Medications? N  Managing your Finances? N  Housekeeping or managing your Housekeeping? N    Patient Care Team: [name], MD as PCP - General (Internal Medicine) Josue Hector, MD as PCP - Cardiology (Cardiology) Bjorn Loser, MD as Consulting Physician (Urology)  Indicate any recent Medical Services you may have received from other than [facility] providers in the past year (date may be approximate).     Assessment:   This is a routine wellness examination for [facility].  Hearing/Vision screen Hearing Screening - Comments:: Patient stated he is 100% deaf I the left ear. Does not wear hearing aids. Vision Screening - Comments:: Patient does wear corrective lenses for distance only. Eye exam done by: Luberta Mutter, MD.   Dietary issues and exercise activities discussed: Current Exercise Habits: Home exercise routine, Type of exercise: walking, Time (Minutes): 30, Frequency (Times/Week): 5, Weekly Exercise (Minutes/Week): 150,  Intensity: Moderate, Exercise limited by: None identified   Goals Addressed  This Visit's Progress    To stay alive.        Depression Screen    11/12/2021   11:55 AM 11/11/2020    8:47 AM 10/20/2020    8:04 AM 09/17/2019    8:20 AM 09/13/2018    2:31 PM 08/05/2017   11:59 AM 08/03/2017    3:29 PM  PHQ 2/9 Scores  PHQ - 2 Score 0 0 0 0 0 0 0  PHQ- 9 Score       0    Fall Risk    11/12/2021   11:36 AM 11/11/2020    8:50 AM 10/20/2020    8:04 AM 09/17/2019    8:20 AM 09/13/2018    2:31 PM  Fall Risk   Falls in the past year? 1 0 0 0 1  Number falls in past yr: 0 0  0 0  Injury with Fall? 0 0  0 0  Risk for fall due to : No Fall Risks No Fall Risks  No Fall Risks   Follow up Falls evaluation completed Falls evaluation completed  Falls evaluation completed;Education provided Falls prevention discussed    FALL RISK PREVENTION PERTAINING TO THE HOME:  Any stairs in or around the home? No  If so, are there any without handrails? No  Home free of loose throw rugs in walkways, pet beds, electrical cords, etc? Yes  Adequate lighting in your home to reduce risk of falls? Yes   ASSISTIVE DEVICES UTILIZED TO PREVENT FALLS:  Life alert? No  Use of a cane, walker or w/c? No  Grab bars in the bathroom? Yes  Shower chair or bench in shower? Yes  Elevated toilet seat or a handicapped toilet? Yes   TIMED UP AND GO:  Was the test performed? No .  Length of time to ambulate 10 feet: n/a sec.   Appearance of gait: Patient not evaluated for gait during this visit.  Cognitive Function:    08/03/2017    3:37 PM  MMSE - Mini Mental State Exam  Orientation to time 5  Orientation to Place 5  Registration 3  Attention/ Calculation 5  Recall 3  Language- name 2 objects 2  Language- repeat 1  Language- follow 3 step command 3  Language- read & follow direction 1  Write a sentence 1  Copy design 1  Total score 30        11/12/2021   12:01 PM 09/17/2019    8:22 AM  6CIT  Screen  What Year? 0 points 0 points  What month? 0 points 0 points  What time? 0 points 0 points  Count back from 20 0 points 0 points  Months in reverse 0 points 0 points  Repeat phrase 0 points 0 points  Total Score 0 points 0 points    Immunizations Immunization History  Administered Date(s) Administered   Fluad Quad(high Dose 65+) 02/02/2019   H1N1 04/24/2008   Influenza Whole 02/27/2008, 04/28/2009, 03/01/2011   Influenza, High Dose Seasonal PF 02/06/2013, 02/21/2014, 02/17/2015, 02/10/2017, 03/09/2018   Influenza,inj,Quad PF,6+ Mos 02/16/2016   Influenza-Unspecified 01/16/2012, 01/16/2020   PFIZER(Purple Top)SARS-COV-2 Vaccination 05/25/2019, 06/15/2019, 01/27/2020   Pneumococcal Conjugate-13 06/26/2013, 02/21/2014   Pneumococcal Polysaccharide-23 02/27/2008, 05/22/2019   Pneumococcal-Unspecified 02/06/2013   Tdap 12/15/2011   Zoster Recombinat (Shingrix) 01/24/2018, 05/22/2018   Zoster, Live 04/28/2009, 05/10/2011, 02/17/2015    TDAP status: Up to date  Flu Vaccine status: Up to date  Pneumococcal vaccine status: Up to date  Covid-19 vaccine status:  Completed vaccines  Qualifies for Shingles Vaccine? Yes   Zostavax completed Yes   Shingrix Completed?: Yes  Screening Tests Health Maintenance  Topic Date Due   COVID-19 Vaccine (4 - Pfizer series) 03/23/2020   TETANUS/TDAP  12/14/2021   INFLUENZA VACCINE  12/15/2021   Pneumonia Vaccine 13+ Years old  Completed   Zoster Vaccines- Shingrix  Completed   HPV VACCINES  Aged Out    Health Maintenance  Health Maintenance Due  Topic Date Due   COVID-19 Vaccine (4 - Pfizer series) 03/23/2020    Colorectal cancer screening: No longer required.   Lung Cancer Screening: (Low Dose CT Chest recommended if Age 29-80 years, 30 pack-year currently smoking OR have quit w/in 15years.) does not qualify.   Lung Cancer Screening Referral: no  Additional Screening:  Hepatitis C Screening: does not qualify; Completed  no  Vision Screening: Recommended annual ophthalmology exams for early detection of glaucoma and other disorders of the eye. Is the patient up to date with their annual eye exam?  Yes  Who is the provider or what is the name of the office in which the patient attends annual eye exams? Luberta Mutter, MD. If pt is not established with a provider, would they like to be referred to a provider to establish care? No .   Dental Screening: Recommended annual dental exams for proper oral hygiene  Community Resource Referral / Chronic Care Management: CRR required this visit?  No   CCM required this visit?  No      Plan:     I have personally reviewed and noted the following in the patient's chart:   Medical and social history Use of alcohol, tobacco or illicit drugs  Current medications and supplements including opioid prescriptions. Patient is not currently taking opioid prescriptions. Functional ability and status Nutritional status Physical activity Advanced directives List of other physicians Hospitalizations, surgeries, and ER visits in previous 12 months Vitals Screenings to include cognitive, depression, and falls Referrals and appointments  In addition, I have reviewed and discussed with patient certain preventive protocols, quality metrics, and best practice recommendations. A written personalized care plan for preventive services as well as general preventive health recommendations were provided to patient.     Sheral Flow, LPN   [id number]   Nurse Notes:  There were no vitals filed for this visit.

## 2021-11-12 NOTE — Patient Instructions (Signed)
[name] , Thank you for taking time to come for your Medicare Wellness Visit. I appreciate your ongoing commitment to your health goals. Please review the following plan we discussed and let me know if I can assist you in the future.   Screening recommendations/referrals: Colonoscopy: Not a candidate for screening due to age Recommended yearly ophthalmology/optometry visit for glaucoma screening and checkup Recommended yearly dental visit for hygiene and checkup  Vaccinations: Influenza vaccine: due 12/15/2021 Pneumococcal vaccine: 02/21/2014, 05/22/2019 Tdap vaccine: 12/15/2011; due every 10 years Shingles vaccine: 01/24/2018, 05/22/2018   Covid-19: 05/25/2019, 06/15/2019, 01/27/2020  Advanced directives: Yes  Conditions/risks identified: Yes  Next appointment: Please schedule your next Medicare Wellness Visit with your Nurse Health Advisor in 1 year by calling [phone].  Preventive Care 32 Years and Older, Male Preventive care refers to lifestyle choices and visits with your health care provider that can promote health and wellness. What does preventive care include? A yearly physical exam. This is also called an annual well check. Dental exams once or twice a year. Routine eye exams. Ask your health care provider how often you should have your eyes checked. Personal lifestyle choices, including: Daily care of your teeth and gums. Regular physical activity. Eating a healthy diet. Avoiding tobacco and drug use. Limiting alcohol use. Practicing safe sex. Taking low doses of aspirin every day. Taking vitamin and mineral supplements as recommended by your health care provider. What happens during an annual well check? The services and screenings done by your health care provider during your annual well check will depend on your age, overall health, lifestyle risk factors, and family history of disease. Counseling  Your health care provider may ask you questions about your: Alcohol  use. Tobacco use. Drug use. Emotional well-being. Home and relationship well-being. Sexual activity. Eating habits. History of falls. Memory and ability to understand (cognition). Work and work Statistician. Screening  You may have the following tests or measurements: Height, weight, and BMI. Blood pressure. Lipid and cholesterol levels. These may be checked every 5 years, or more frequently if you are over 58 years old. Skin check. Lung cancer screening. You may have this screening every year starting at age 23 if you have a 30-pack-year history of smoking and currently smoke or have quit within the past 15 years. Fecal occult blood test (FOBT) of the stool. You may have this test every year starting at age 39. Flexible sigmoidoscopy or colonoscopy. You may have a sigmoidoscopy every 5 years or a colonoscopy every 10 years starting at age 66. Prostate cancer screening. Recommendations will vary depending on your family history and other risks. Hepatitis C blood test. Hepatitis B blood test. Sexually transmitted disease (STD) testing. Diabetes screening. This is done by checking your blood sugar (glucose) after you have not eaten for a while (fasting). You may have this done every 1-3 years. Abdominal aortic aneurysm (AAA) screening. You may need this if you are a current or former smoker. Osteoporosis. You may be screened starting at age 58 if you are at high risk. Talk with your health care provider about your test results, treatment options, and if necessary, the need for more tests. Vaccines  Your health care provider may recommend certain vaccines, such as: Influenza vaccine. This is recommended every year. Tetanus, diphtheria, and acellular pertussis (Tdap, Td) vaccine. You may need a Td booster every 10 years. Zoster vaccine. You may need this after age 57. Pneumococcal 13-valent conjugate (PCV13) vaccine. One dose is recommended after age 20.  Pneumococcal polysaccharide  (PPSV23) vaccine. One dose is recommended after age 17. Talk to your health care provider about which screenings and vaccines you need and how often you need them. This information is not intended to replace advice given to you by your health care provider. Make sure you discuss any questions you have with your health care provider. Document Released: 05/30/2015 Document Revised: 01/21/2016 Document Reviewed: 03/04/2015 Elsevier Interactive Patient Education  2017 [place] Prevention in the Home Falls can cause injuries. They can happen to people of all ages. There are many things you can do to make your home safe and to help prevent falls. What can I do on the outside of my home? Regularly fix the edges of walkways and driveways and fix any cracks. Remove anything that might make you trip as you walk through a door, such as a raised step or threshold. Trim any bushes or trees on the path to your home. Use bright outdoor lighting. Clear any walking paths of anything that might make someone trip, such as rocks or tools. Regularly check to see if handrails are loose or broken. Make sure that both sides of any steps have handrails. Any raised decks and porches should have guardrails on the edges. Have any leaves, snow, or ice cleared regularly. Use sand or salt on walking paths during winter. Clean up any spills in your garage right away. This includes oil or grease spills. What can I do in the bathroom? Use night lights. Install grab bars by the toilet and in the tub and shower. Do not use towel bars as grab bars. Use non-skid mats or decals in the tub or shower. If you need to sit down in the shower, use a plastic, non-slip stool. Keep the floor dry. Clean up any water that spills on the floor as soon as it happens. Remove soap buildup in the tub or shower regularly. Attach bath mats securely with double-sided non-slip rug tape. Do not have throw rugs and other things on the  floor that can make you trip. What can I do in the bedroom? Use night lights. Make sure that you have a light by your bed that is easy to reach. Do not use any sheets or blankets that are too big for your bed. They should not hang down onto the floor. Have a firm chair that has side arms. You can use this for support while you get dressed. Do not have throw rugs and other things on the floor that can make you trip. What can I do in the kitchen? Clean up any spills right away. Avoid walking on wet floors. Keep items that you use a lot in easy-to-reach places. If you need to reach something above you, use a strong step stool that has a grab bar. Keep electrical cords out of the way. Do not use floor polish or wax that makes floors slippery. If you must use wax, use non-skid floor wax. Do not have throw rugs and other things on the floor that can make you trip. What can I do with my stairs? Do not leave any items on the stairs. Make sure that there are handrails on both sides of the stairs and use them. Fix handrails that are broken or loose. Make sure that handrails are as long as the stairways. Check any carpeting to make sure that it is firmly attached to the stairs. Fix any carpet that is loose or worn. Avoid having throw rugs at the  top or bottom of the stairs. If you do have throw rugs, attach them to the floor with carpet tape. Make sure that you have a light switch at the top of the stairs and the bottom of the stairs. If you do not have them, ask someone to add them for you. What else can I do to help prevent falls? Wear shoes that: Do not have high heels. Have rubber bottoms. Are comfortable and fit you well. Are closed at the toe. Do not wear sandals. If you use a stepladder: Make sure that it is fully opened. Do not climb a closed stepladder. Make sure that both sides of the stepladder are locked into place. Ask someone to hold it for you, if possible. Clearly mark and make  sure that you can see: Any grab bars or handrails. First and last steps. Where the edge of each step is. Use tools that help you move around (mobility aids) if they are needed. These include: Canes. Walkers. Scooters. Crutches. Turn on the lights when you go into a dark area. Replace any light bulbs as soon as they burn out. Set up your furniture so you have a clear path. Avoid moving your furniture around. If any of your floors are uneven, fix them. If there are any pets around you, be aware of where they are. Review your medicines with your doctor. Some medicines can make you feel dizzy. This can increase your chance of falling. Ask your doctor what other things that you can do to help prevent falls. This information is not intended to replace advice given to you by your health care provider. Make sure you discuss any questions you have with your health care provider. Document Released: 02/27/2009 Document Revised: 10/09/2015 Document Reviewed: 06/07/2014 Elsevier Interactive Patient Education  2017 [employer].

## 2021-11-20 ENCOUNTER — Other Ambulatory Visit: Payer: Self-pay

## 2021-11-20 MED ORDER — METOPROLOL SUCCINATE ER 50 MG PO TB24: 50.0000 mg | ORAL_TABLET | Freq: Every day | ORAL | 1 refills | Status: DC

## 2021-11-24 DIAGNOSIS — R3915 Urgency of urination: Secondary | ICD-10-CM | POA: Diagnosis not present

## 2021-12-17 ENCOUNTER — Encounter: Payer: Self-pay | Admitting: Internal Medicine

## 2021-12-17 ENCOUNTER — Other Ambulatory Visit: Payer: Self-pay | Admitting: Internal Medicine

## 2021-12-17 DIAGNOSIS — F411 Generalized anxiety disorder: Secondary | ICD-10-CM

## 2021-12-17 MED ORDER — ALPRAZOLAM 0.25 MG PO TABS: 0.2500 mg | ORAL_TABLET | Freq: Two times a day (BID) | ORAL | 3 refills | Status: DC | PRN

## 2021-12-22 ENCOUNTER — Ambulatory Visit: Payer: Medicare Other

## 2021-12-22 ENCOUNTER — Ambulatory Visit (INDEPENDENT_AMBULATORY_CARE_PROVIDER_SITE_OTHER): Payer: Medicare Other | Admitting: Family Medicine

## 2021-12-22 VITALS — BP 134/68 | HR 82 | Ht 70.0 in | Wt 142.0 lb

## 2021-12-22 DIAGNOSIS — M7062 Trochanteric bursitis, left hip: Secondary | ICD-10-CM | POA: Diagnosis not present

## 2021-12-22 DIAGNOSIS — R3915 Urgency of urination: Secondary | ICD-10-CM | POA: Diagnosis not present

## 2021-12-22 DIAGNOSIS — I2510 Atherosclerotic heart disease of native coronary artery without angina pectoris: Secondary | ICD-10-CM

## 2021-12-22 DIAGNOSIS — M7061 Trochanteric bursitis, right hip: Secondary | ICD-10-CM

## 2021-12-22 DIAGNOSIS — M25552 Pain in left hip: Secondary | ICD-10-CM

## 2021-12-22 MED ORDER — PREDNISONE 20 MG PO TABS: ORAL_TABLET | ORAL | 0 refills | Status: DC

## 2021-12-22 NOTE — Assessment & Plan Note (Signed)
Chronic problem with exacerbation.  Worsening pain.  Has had difficulty with the gluteal tendon previously and has responded extremely well to PRP.  Patient given injection today and hopefully this will help out significantly.  Follow-up again in 6 weeks and will consider possibly repeating the PRP

## 2021-12-22 NOTE — Progress Notes (Signed)
[place] Phone: [phone] Subjective:    I'm seeing this patient by the request  of:  [name], MD  CC: Left hip pain follow-up  BHA:[id number]  10/28/2021 Significant improvement as well.  Continuing to be active.  We will follow-up with me as needed  Patient has full range of motion at this moment. Is having no significant discomfort or pain at the moment.  Just some mild discomfort at night he states.  Discussed with patient about and home exercises, icing regimen, which activities to do and which ones to avoid, increase activity slowly.  No significant restrictions and follow-up with me as needed  Updated 12/22/2021 [name] [name] is a 88 y.o. male coming in with complaint of left hip pain. At night hip hurts the worst.Pain is sporadic. Take ibuprofen to help. No numbness or tingling.       Past Medical History:  Diagnosis Date   Anxiety    CAD (coronary artery disease)    a. s/p stent to LAD 2008;  b. abnormal ETT 11/13 with VTach => LHC pLAD 30% ISR, mCFX 30%, dRCA 60-75% which had progressed from previous study in 2010 => FFR of RCA 0.92 => med Rx    Carotid stenosis    dopplers 1/14:  0-39% bilateral ICA stenosis   Colon polyps    Colon polyps    adenomatous   Diverticular disease of colon    Hemorrhoids    external and internal   History of BPH    HTN (hypertension)    Hx of echocardiogram    a. Echo 9/11:  EF 55-60%, normal motion, grade 2 diastolic dysfunction, mild MR, mild LAE, mild RAE, PASP 33.   Hyperlipidemia    IBS (irritable bowel syndrome)    Rhinitis    Stroke, lacunar ([place]) 08/24/2012   Old, 2011 head mri   Past Surgical History:  Procedure Laterality Date   APPENDECTOMY  1956   CORONARY STENT PLACEMENT  2008    Successful PCI of the lesion in the proximal LAD using a Promus    HERNIA REPAIR Right 2005   NASAL SINUS SURGERY  1975   PERCUTANEOUS CORONARY STENT  INTERVENTION (PCI-S) N/A 05/03/2012   Procedure: PERCUTANEOUS CORONARY STENT INTERVENTION (PCI-S);  Surgeon: Sherren Mocha, MD;  Location: [facility] CATH LAB;  Service: Cardiovascular;  Laterality: N/A;   Promus DES ok for 3T MRI  2008   Social History   Socioeconomic History   Marital status: Married    Spouse name: Not on file   Number of children: 3   Years of education: 16   Highest education level: Not on file  Occupational History   Occupation: businessman, 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RETIRED  Tobacco Use   Smoking status: Never    Passive exposure: Never   Smokeless tobacco: Never  Vaping Use   Vaping Use: Never used  Substance and Sexual Activity   Alcohol use: Not Currently    Comment: 4-6 drinks/week   Drug use: No   Sexual activity: Yes    Partners: Female  Other Topics Concern   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 He has two daughters, 1 in [place] 1 in [place] (Dec '12) and a son who works with him. Several grandchildren.Reports that he spends a  good deal of time at his home in [place] which he greatly enjoys and his home in [place]. Enjoys driving his BMW convertible when [place].      ACP - Yes CPR, yes for short-term mechanical ventilation for reversible disease. Recommended [email or web address] for consideration.   Social Determinants of Health   Financial Resource Strain: Low Risk  (11/12/2021)   Overall Financial Resource Strain (CARDIA)    Difficulty of Paying Living Expenses: Not hard at all  Food Insecurity: No Food Insecurity (11/12/2021)   Hunger Vital Sign    Worried About Running Out of Food in the Last Year: Never true    Ran Out of Food in the Last Year: Never true  Transportation Needs: No Transportation Needs (11/12/2021)   PRAPARE - Hydrologist  (Medical): No    Lack of Transportation (Non-Medical): No  Physical Activity: Sufficiently Active (11/12/2021)   Exercise Vital Sign    Days of Exercise per Week: 5 days    Minutes of Exercise per Session: 30 min  Stress: No Stress Concern Present (11/12/2021)   [place]    Feeling of Stress : Not at all  Social Connections: [place] (11/12/2021)   Social Connection and Isolation Panel [NHANES]    Frequency of Communication with Friends and Family: More than three times a week    Frequency of Social Gatherings with Friends and Family: More than three times a week    Attends Religious Services: More than 4 times per year    Active Member of [employer] or Organizations: Yes    Attends Music therapist: More than 4 times per year    Marital Status: Married   No Known Allergies Family History  Problem Relation Age of Onset   Coronary artery disease Father    Heart attack Father    Rheum arthritis Mother    Cancer Brother        ? TYPE   Colon cancer Neg Hx    Stomach cancer Neg Hx    Pancreatic cancer Neg Hx     Current Outpatient Medications (Endocrine & Metabolic):    predniSONE (DELTASONE) 20 MG tablet, Take one tablet daily for the next 5 days.  Current Outpatient Medications (Cardiovascular):    lisinopril (ZESTRIL) 10 MG tablet, TAKE 1 TABLET(10 MG) BY MOUTH DAILY   metoprolol succinate (TOPROL-XL) 50 MG 24 hr tablet, Take 1 tablet (50 mg total) by mouth daily.   rosuvastatin (CRESTOR) 40 MG tablet, Take 1 tablet (40 mg total) by mouth daily.     Current Outpatient Medications (Other):    ALPRAZolam (XANAX) 0.25 MG tablet, Take 1 tablet (0.25 mg total) by mouth 2 (two) times daily as needed for anxiety.   dutasteride (AVODART) 0.5 MG capsule, Take 0.5 mg by mouth daily.   hydrocortisone 2.5 % cream, Apply 1 application topically 2 (two) times daily.   Polyethylene Glycol 3350 (MIRALAX  PO), Take by mouth.   psyllium (METAMUCIL) 58.6 % packet, Take 1 packet by mouth daily.   tamsulosin (FLOMAX) 0.4 MG CAPS capsule, Take 0.4 mg by mouth daily.   traZODone (DESYREL) 50 MG tablet, TAKE 1/2 TO 1 TABLET BY MOUTH DAILY AT BEDTIME   zolpidem (AMBIEN) 10 MG tablet, TAKE 1 TABLET(10 MG) BY MOUTH AT BEDTIME AS NEEDED FOR SLEEP   Reviewed prior external information including notes and imaging from  primary care provider As well as notes  that were available from care everywhere and other healthcare systems.  Past medical history, social, surgical and family history all reviewed in electronic medical record.  No pertanent information unless stated regarding to the chief complaint.   Review of Systems:  No headache, visual changes, nausea, vomiting, diarrhea, constipation, dizziness, abdominal pain, skin rash, fevers, chills, night sweats, weight loss, swollen lymph nodes, body aches, joint swelling, chest pain, shortness of breath, mood changes. POSITIVE muscle aches  Objective  Blood pressure 134/68, pulse 82, height '5\' 10"'$  (1.778 m), weight 142 lb (64.4 kg), SpO2 94 %.   General: No apparent distress alert and oriented x3 mood and affect normal, dressed appropriately.  HEENT: Pupils equal, extraocular movements intact  Respiratory: Patient's speak in full sentences and does not appear short of breath  Cardiovascular: No lower extremity edema, non tender, no erythema  Mild antalgic gait.  Tender to palpation over the greater trochanteric area.  Does have some mild swelling noted.   Procedure: Real-time Ultrasound Guided Injection of left  greater trochanteric bursitis secondary to patient's body habitus Device: GE Logiq Q7  Ultrasound guided injection is preferred based studies that show increased duration, increased effect, greater accuracy, decreased procedural pain, increased response rate, and decreased cost with ultrasound guided versus blind injection.  Verbal informed  consent obtained.  Time-out conducted.  Noted no overlying erythema, induration, or other signs of local infection.  Skin prepped in a sterile fashion.  Local anesthesia: Topical Ethyl chloride.  With sterile technique and under real time ultrasound guidance:  Greater trochanteric area was visualized and patient's bursa was noted. A 22-gauge 3 inch needle was inserted and 4 cc of 0.5% Marcaine and 1 cc of Kenalog 40 mg/dL was injected. Pictures taken Completed without difficulty  Pain immediately resolved suggesting accurate placement of the medication.  Advised to call if fevers/chills, erythema, induration, drainage, or persistent bleeding.  Impression: Technically successful ultrasound guided injection.    Impression and Recommendations:     The above documentation has been reviewed and is accurate and complete Lyndal Pulley, DO

## 2021-12-22 NOTE — Patient Instructions (Addendum)
Prednisone prescribed: Take for trip just in case Injection in hip today

## 2021-12-28 ENCOUNTER — Telehealth: Payer: Self-pay | Admitting: Internal Medicine

## 2021-12-28 NOTE — Telephone Encounter (Signed)
Pt has been scheduled for 8/15 @ 11.20am with PCP.

## 2021-12-28 NOTE — Telephone Encounter (Signed)
Pt daughter Mendel Ryder is requesting a call from Dr. Ronnald Ramp nurse. She said she would like to discuss some symptoms that her father is currently experiencing. She said her father has been having a headache behind his right eye. She said the pt is also experiencing some shortness of breath.    CB: [phone]

## 2021-12-29 ENCOUNTER — Ambulatory Visit (INDEPENDENT_AMBULATORY_CARE_PROVIDER_SITE_OTHER): Payer: Medicare Other | Admitting: Internal Medicine

## 2021-12-29 ENCOUNTER — Encounter: Payer: Self-pay | Admitting: Internal Medicine

## 2021-12-29 ENCOUNTER — Ambulatory Visit
Admission: RE | Admit: 2021-12-29 | Discharge: 2021-12-29 | Disposition: A | Discharge status: Home / Self Care | Payer: Medicare Other | Source: Ambulatory Visit | Attending: Internal Medicine | Admitting: Internal Medicine

## 2021-12-29 VITALS — BP 132/74 | HR 78 | Temp 98.0°F | Resp 16 | Ht 70.0 in | Wt 138.0 lb

## 2021-12-29 DIAGNOSIS — I10 Essential (primary) hypertension: Secondary | ICD-10-CM | POA: Diagnosis not present

## 2021-12-29 DIAGNOSIS — G4452 New daily persistent headache (NDPH): Secondary | ICD-10-CM | POA: Diagnosis not present

## 2021-12-29 DIAGNOSIS — F410 Panic disorder [episodic paroxysmal anxiety] without agoraphobia: Secondary | ICD-10-CM | POA: Insufficient documentation

## 2021-12-29 DIAGNOSIS — R519 Headache, unspecified: Secondary | ICD-10-CM | POA: Diagnosis not present

## 2021-12-29 DIAGNOSIS — S065XAA Traumatic subdural hemorrhage with loss of consciousness status unknown, initial encounter: Secondary | ICD-10-CM | POA: Insufficient documentation

## 2021-12-29 DIAGNOSIS — I2510 Atherosclerotic heart disease of native coronary artery without angina pectoris: Secondary | ICD-10-CM

## 2021-12-29 LAB — BASIC METABOLIC PANEL
BUN: 20 mg/dL (ref 6–23)
CO2: 26 mEq/L (ref 19–32)
Calcium: 9 mg/dL (ref 8.4–10.5)
Chloride: 105 mEq/L (ref 96–112)
Creatinine, Ser: 1.13 mg/dL (ref 0.40–1.50)
GFR: 58.8 mL/min — ABNORMAL LOW (ref >60.00)
Glucose, Bld: 100 mg/dL — ABNORMAL HIGH (ref 70–99)
Potassium: 4.1 mEq/L (ref 3.5–5.1)
Sodium: 136 mEq/L (ref 135–145)

## 2021-12-29 LAB — CBC WITH DIFFERENTIAL/PLATELET
Basophils Absolute: 0 10*3/uL (ref 0.0–0.1)
Basophils Relative: 0.4 % (ref 0.0–3.0)
Eosinophils Absolute: 0.2 10*3/uL (ref 0.0–0.7)
Eosinophils Relative: 2.2 % (ref 0.0–5.0)
HCT: 42.1 % (ref 39.0–52.0)
Hemoglobin: 14.2 g/dL (ref 13.0–17.0)
Lymphocytes Relative: 18.6 % (ref 12.0–46.0)
Lymphs Abs: 1.4 10*3/uL (ref 0.7–4.0)
MCHC: 33.7 g/dL (ref 30.0–36.0)
MCV: 91.7 fl (ref 78.0–100.0)
Monocytes Absolute: 0.7 10*3/uL (ref 0.1–1.0)
Monocytes Relative: 10 % (ref 3.0–12.0)
Neutro Abs: 5.2 10*3/uL (ref 1.4–7.7)
Neutrophils Relative %: 68.8 % (ref 43.0–77.0)
Platelets: 194 10*3/uL (ref 150.0–400.0)
RBC: 4.58 Mil/uL (ref 4.22–5.81)
RDW: 13.7 % (ref 11.5–15.5)
WBC: 7.5 10*3/uL (ref 4.0–10.5)

## 2021-12-29 LAB — C-REACTIVE PROTEIN: CRP: <1 mg/dL (ref 0.5–20.0)

## 2021-12-29 NOTE — Progress Notes (Unsigned)
Subjective:  Patient ID: [name] [name], male    DOB: 02/01/1933  Age: 88 y.o. MRN: [id number]  CC: Headache   HPI [name] [name] presents for f/up -  He complains of a 3-day history of headache.  It started around the right eye and then moved to the left eye.  He had a brief episode of visual disturbance.  The headache was dull but so severe it caused a panic attack.  He denies nausea, vomiting, slurred speech, ataxia, or paresthesias.  He controlled the panic sensation with diazepam.  Outpatient Medications Prior to Visit  Medication Sig Dispense Refill   ALPRAZolam (XANAX) 0.25 MG tablet Take 1 tablet (0.25 mg total) by mouth 2 (two) times daily as needed for anxiety. 60 tablet 3   dutasteride (AVODART) 0.5 MG capsule Take 0.5 mg by mouth daily.     hydrocortisone 2.5 % cream Apply 1 application topically 2 (two) times daily.     lisinopril (ZESTRIL) 10 MG tablet TAKE 1 TABLET(10 MG) BY MOUTH DAILY 90 tablet 2   metoprolol succinate (TOPROL-XL) 50 MG 24 hr tablet Take 1 tablet (50 mg total) by mouth daily. 90 tablet 1   Polyethylene Glycol 3350 (MIRALAX PO) Take by mouth.     psyllium (METAMUCIL) 58.6 % packet Take 1 packet by mouth daily.     rosuvastatin (CRESTOR) 40 MG tablet Take 1 tablet (40 mg total) by mouth daily. 90 tablet 2   tamsulosin (FLOMAX) 0.4 MG CAPS capsule Take 0.4 mg by mouth daily.     traZODone (DESYREL) 50 MG tablet TAKE 1/2 TO 1 TABLET BY MOUTH DAILY AT BEDTIME 30 tablet 1   zolpidem (AMBIEN) 10 MG tablet TAKE 1 TABLET(10 MG) BY MOUTH AT BEDTIME AS NEEDED FOR SLEEP 90 tablet 0   predniSONE (DELTASONE) 20 MG tablet Take one tablet daily for the next 5 days. 5 tablet 0   No facility-administered medications prior to visit.    ROS Review of Systems  Constitutional: Negative.  Negative for diaphoresis and fatigue.  HENT: Negative.  Negative for trouble swallowing.   Eyes:  Positive for visual disturbance. Negative for photophobia.  Respiratory: Negative.   Negative for cough, chest tightness, shortness of breath and wheezing.   Cardiovascular:  Negative for chest pain, palpitations and leg swelling.  Gastrointestinal:  Negative for abdominal pain, diarrhea, nausea and vomiting.  Genitourinary:  Negative for difficulty urinating.  Musculoskeletal: Negative.  Negative for arthralgias and myalgias.  Skin: Negative.   Neurological:  Positive for headaches. Negative for dizziness, tremors, seizures, syncope, speech difficulty, weakness, light-headedness and numbness.  Hematological:  Negative for adenopathy. Does not bruise/bleed easily.  Psychiatric/Behavioral:  Negative for sleep disturbance. The patient is nervous/anxious.     Objective:  BP 120/64 (BP Location: Left Arm, Patient Position: Sitting, Cuff Size: Normal)   Pulse 78   Temp 98 F (36.7 C) (Oral)   Ht '5\' 10"'$  (1.778 m)   Wt 138 lb (62.6 kg)   SpO2 95%   BMI 19.80 kg/m   BP Readings from Last 3 Encounters:  12/29/21 120/64  12/22/21 134/68  10/28/21 112/66    Wt Readings from Last 3 Encounters:  12/29/21 138 lb (62.6 kg)  12/22/21 142 lb (64.4 kg)  11/12/21 137 lb (62.1 kg)    Physical Exam  Lab Results  Component Value Date   WBC 7.5 12/29/2021   HGB 14.2 12/29/2021   HCT 42.1 12/29/2021   PLT 194.0 12/29/2021   GLUCOSE 100 (H)  12/29/2021   CHOL 140 10/20/2020   TRIG 121.0 10/20/2020   HDL 69.50 10/20/2020   LDLDIRECT 132.2 03/10/2007   LDLCALC 46 10/20/2020   ALT 15 10/20/2020   AST 17 10/20/2020   NA 136 12/29/2021   K 4.1 12/29/2021   CL 105 12/29/2021   CREATININE 1.13 12/29/2021   BUN 20 12/29/2021   CO2 26 12/29/2021   TSH 2.88 10/21/2021   PSA 0.44 05/10/2011   INR 1.0 04/27/2012    MR SHOULDER RIGHT W WO CONTRAST  Result Date: 04/27/2021 CLINICAL DATA:  Shoulder pain, chronic, osteoarthritis suspected shoulder pain EXAM: MRI OF THE RIGHT SHOULDER WITHOUT AND WITH CONTRAST TECHNIQUE: Multiplanar, multisequence MR imaging of the RIGHT  shoulder was performed before and after the administration of intravenous contrast. CONTRAST:  24m MULTIHANCE GADOBENATE DIMEGLUMINE 529 MG/ML IV SOLN COMPARISON:  None. FINDINGS: Rotator cuff: There is a full-thickness, full width tear of the supraspinatus tendon with at least 2.6 cm retraction. There is tendinosis and intermediate grade, partial width, articular sided tearing of the mid to posterior infraspinatus tendon at the footprint. Severe tendinosis of the subscapularis tendon with focal low-grade articular sided tearing of the cephalad fibers at the footprint. Muscles: No significant muscle atrophy. There is muscle edema within the supraspinatus and subscapularis, partially visualized. Biceps Long Head: Intact intra and extra-articular long head biceps tendon. Acromioclavicular Joint: Moderate arthropathy of the acromioclavicular joint, with joint effusion and pericapsular edema. Small amount of subacromial/subdeltoid bursal fluid related to the full-thickness cuff tear. Glenohumeral Joint: Moderate size joint effusion, a significant amount of which collecting along the subscapularis recess, with synovial enhancement. Mild to moderate chondrosis. Labrum: Degenerative anterior superior labral tearing. Bones: No acute fracture or dislocation. Reactive bony edema along the greater tuberosity. No focal bone lesion. Other: No fluid collection or hematoma. IMPRESSION: Full-thickness, full width tear of the supraspinatus tendon with a least 2.6 cm retraction. Tendinosis and intermediate grade partial width articular sided tearing of the infraspinatus tendon at the footprint. Severe tendinosis of the distal subscapularis tendon with focal low-grade articular sided tearing of the cephalad fibers at the footprint. Mild to moderate glenohumeral arthritis with degenerative anterior superior labral tearing. Moderate AC joint arthropathy, with effusion and pericapsular edema. Moderate-sized joint effusion.  Electronically Signed   By: JMaurine SimmeringM.D.   On: 04/27/2021 09:13   MR Brain Wo Contrast  Result Date: 12/29/2021 CLINICAL DATA:  Headache, new or worsening (Age >= 50y) EXAM: MRI HEAD WITHOUT CONTRAST TECHNIQUE: Multiplanar, multiecho pulse sequences of the brain and surrounding structures were obtained without intravenous contrast. COMPARISON:  2015 FINDINGS: Brain: There is no acute infarction or intracranial hemorrhage. There is no intracranial mass, mass effect, or edema. There is no hydrocephalus. Prominence of the ventricles and sulci reflects parenchymal volume loss. Patchy T2 hyperintensity in the supratentorial white matter is nonspecific but may reflect mild chronic microvascular ischemic changes. These findings have progressed since 2015. Increased slight prominence of the left cerebral convexity extra-axial space may reflect a thin subdural hygroma. Vascular: Major vessel flow voids at the skull base are preserved. Skull and upper cervical spine: Normal marrow signal is preserved. Sinuses/Orbits: Paranasal sinuses are aerated. Right lens replacement. Other: Sella is unremarkable.  Mastoid air cells are clear. IMPRESSION: Possible thin left cerebral convexity subdural hematoma without mass effect. No intracranial mass. Mild chronic microvascular ischemic changes. Electronically Signed   By: PMacy MisM.D.   On: 12/29/2021 13:40     Assessment & Plan:   TMarcello Moores  was seen today for headache.  Diagnoses and all orders for this visit:  New daily persistent headache -     MR Brain Wo Contrast; Future -     Basic metabolic panel; Future -     CBC with Differential/Platelet; Future -     C-reactive protein; Future -     C-reactive protein -     CBC with Differential/Platelet -     Basic metabolic panel  Essential hypertension -     Basic metabolic panel; Future -     CBC with Differential/Platelet; Future -     CBC with Differential/Platelet -     Basic metabolic panel  Panic  anxiety syndrome  SDH (subdural hematoma) (HCC)   I have discontinued Macky Lower "Cooper"'s predniSONE. I am also having him maintain his tamsulosin, psyllium, dutasteride, Polyethylene Glycol 3350 (MIRALAX PO), hydrocortisone, rosuvastatin, lisinopril, traZODone, zolpidem, metoprolol succinate, and ALPRAZolam.  No orders of the defined types were placed in this encounter.    Follow-up: No follow-ups on file.  Scarlette Calico, MD

## 2021-12-30 ENCOUNTER — Encounter: Payer: Self-pay | Admitting: Internal Medicine

## 2022-01-01 ENCOUNTER — Other Ambulatory Visit: Payer: Self-pay | Admitting: Internal Medicine

## 2022-01-01 ENCOUNTER — Encounter: Payer: Self-pay | Admitting: Internal Medicine

## 2022-01-01 DIAGNOSIS — R930 Abnormal findings on diagnostic imaging of skull and head, not elsewhere classified: Secondary | ICD-10-CM | POA: Insufficient documentation

## 2022-01-04 ENCOUNTER — Other Ambulatory Visit: Payer: Self-pay | Admitting: Cardiovascular Disease

## 2022-01-12 DIAGNOSIS — D1801 Hemangioma of skin and subcutaneous tissue: Secondary | ICD-10-CM | POA: Diagnosis not present

## 2022-01-12 DIAGNOSIS — Z85828 Personal history of other malignant neoplasm of skin: Secondary | ICD-10-CM | POA: Diagnosis not present

## 2022-01-12 DIAGNOSIS — D485 Neoplasm of uncertain behavior of skin: Secondary | ICD-10-CM | POA: Diagnosis not present

## 2022-01-12 DIAGNOSIS — L570 Actinic keratosis: Secondary | ICD-10-CM | POA: Diagnosis not present

## 2022-01-12 DIAGNOSIS — L739 Follicular disorder, unspecified: Secondary | ICD-10-CM | POA: Diagnosis not present

## 2022-01-12 DIAGNOSIS — L821 Other seborrheic keratosis: Secondary | ICD-10-CM | POA: Diagnosis not present

## 2022-01-12 DIAGNOSIS — C44729 Squamous cell carcinoma of skin of left lower limb, including hip: Secondary | ICD-10-CM | POA: Diagnosis not present

## 2022-01-12 DIAGNOSIS — L814 Other melanin hyperpigmentation: Secondary | ICD-10-CM | POA: Diagnosis not present

## 2022-01-12 DIAGNOSIS — Z85820 Personal history of malignant melanoma of skin: Secondary | ICD-10-CM | POA: Diagnosis not present

## 2022-01-13 ENCOUNTER — Telehealth (INDEPENDENT_AMBULATORY_CARE_PROVIDER_SITE_OTHER): Payer: Medicare Other | Admitting: Family Medicine

## 2022-01-13 ENCOUNTER — Encounter: Payer: Self-pay | Admitting: Family Medicine

## 2022-01-13 DIAGNOSIS — U071 COVID-19: Secondary | ICD-10-CM

## 2022-01-13 DIAGNOSIS — I2510 Atherosclerotic heart disease of native coronary artery without angina pectoris: Secondary | ICD-10-CM

## 2022-01-13 DIAGNOSIS — R5383 Other fatigue: Secondary | ICD-10-CM

## 2022-01-13 MED ORDER — MOLNUPIRAVIR 200 MG PO CAPS: 4.0000 | ORAL_CAPSULE | Freq: Two times a day (BID) | ORAL | 0 refills | Status: AC

## 2022-01-13 NOTE — Progress Notes (Signed)
MyChart Video Visit    Virtual Visit via Video Note   This visit type was conducted due to national recommendations for restrictions regarding the COVID-19 Pandemic (e.g. social distancing) in an effort to limit this patient's exposure and mitigate transmission in our community. This patient is at least at moderate risk for complications without adequate follow up. This format is felt to be most appropriate for this patient at this time. Physical exam was limited by quality of the video and audio technology used for the visit. CMA was able to get the patient set up on a video visit.  Patient location: Home. Patient and provider in visit Provider location: Office  I discussed the limitations of evaluation and management by telemedicine and the availability of in person appointments. The patient expressed understanding and agreed to proceed.  Visit Date: 01/13/2022  Today's healthcare provider: Harland Dingwall, NP-C     Subjective:    Patient ID: [name] [name], male    DOB: [birth year], 88 y.o.   MRN: [id number]  Chief Complaint  Patient presents with   Covid Positive    Tested positive 8/29 Sx including: fatigue     HPI  Tested positive for Covid yesterday. His only symptom is fatigue. No URI symptoms.   Denies fever, chills, dizziness, chest pain, palpitations, shortness of breath, abdominal pain, N/V/D.   Recently diagnosed with possible thin left cerebral subdural hematoma and is avoiding NSAIDs as recommended. Taking Tylenol    States he had Covid over a year ago.  He has had several Covid vaccines     Past Medical History:  Diagnosis Date   Anxiety    CAD (coronary artery disease)    a. s/p stent to LAD 2008;  b. abnormal ETT 11/13 with VTach => LHC pLAD 30% ISR, mCFX 30%, dRCA 60-75% which had progressed from previous study in 2010 => FFR of RCA 0.92 => med Rx    Carotid stenosis    dopplers 1/14:  0-39% bilateral ICA stenosis   Colon polyps    Colon polyps     adenomatous   Diverticular disease of colon    Hemorrhoids    external and internal   History of BPH    HTN (hypertension)    Hx of echocardiogram    a. Echo 9/11:  EF 55-60%, normal motion, grade 2 diastolic dysfunction, mild MR, mild LAE, mild RAE, PASP 33.   Hyperlipidemia    IBS (irritable bowel syndrome)    Rhinitis    Stroke, lacunar ([place]) 08/24/2012   Old, 2011 head mri    Past Surgical History:  Procedure Laterality Date   APPENDECTOMY  1956   CORONARY STENT PLACEMENT  2008    Successful PCI of the lesion in the proximal LAD using a Promus    HERNIA REPAIR Right 2005   NASAL SINUS SURGERY  1975   PERCUTANEOUS CORONARY STENT INTERVENTION (PCI-S) N/A 05/03/2012   Procedure: PERCUTANEOUS CORONARY STENT INTERVENTION (PCI-S);  Surgeon: Sherren Mocha, MD;  Location: [facility] CATH LAB;  Service: Cardiovascular;  Laterality: N/A;   Promus DES ok for 3T MRI  2008    Family History  Problem Relation Age of Onset   Coronary artery disease Father    Heart attack Father    Rheum arthritis Mother    Cancer Brother        ? TYPE   Colon cancer Neg Hx    Stomach cancer Neg Hx    Pancreatic cancer Neg Hx  Social History   Socioeconomic History   Marital status: Married    Spouse name: Not on file   Number of children: 3   Years of education: 16   Highest education level: Not on file  Occupational History   Occupation: businessman, 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RETIRED  Tobacco Use   Smoking status: Never    Passive exposure: Never   Smokeless tobacco: Never  Vaping Use   Vaping Use: Never used  Substance and Sexual Activity   Alcohol use: Not Currently    Comment: 4-6 drinks/week   Drug use: No   Sexual activity: Yes    Partners: Female  Other Topics Concern   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 He has two  daughters, 1 in [place] 1 in [place] (Dec '12) and a son who works with him. Several grandchildren.Reports that he spends a good deal of time at his home in [facility] which he greatly enjoys and his home in the [place]. Enjoys driving his BMW convertible when in the mountains.      ACP - Yes CPR, yes for short-term mechanical ventilation for reversible disease. Recommended [email or web address] for consideration.   Social Determinants of Health   Financial Resource Strain: Low Risk  (11/12/2021)   Overall Financial Resource Strain (CARDIA)    Difficulty of Paying Living Expenses: Not hard at all  Food Insecurity: No Food Insecurity (11/12/2021)   Hunger Vital Sign    Worried About Running Out of Food in the Last Year: Never true    Ran Out of Food in the Last Year: Never true  Transportation Needs: No Transportation Needs (11/12/2021)   PRAPARE - Hydrologist (Medical): No    Lack of Transportation (Non-Medical): No  Physical Activity: Sufficiently Active (11/12/2021)   Exercise Vital Sign    Days of Exercise per Week: 5 days    Minutes of Exercise per Session: 30 min  Stress: No Stress Concern Present (11/12/2021)   [place]    Feeling of Stress : Not at all  Social Connections: [place] (11/12/2021)   Social Connection and Isolation Panel [NHANES]    Frequency of Communication with Friends and Family: More than three times a week    Frequency of Social Gatherings with Friends and Family: More than three times a week    Attends Religious Services: More than 4 times per year    Active Member of [employer] or Organizations: Yes    Attends Music therapist: More than 4 times per year    Marital Status: Married  Human resources officer Violence: Not At Risk (11/12/2021)   Humiliation, Afraid, Rape, and Kick questionnaire    Fear of Current or Ex-Partner: No    Emotionally Abused:  No    Physically Abused: No    Sexually Abused: No    Outpatient Medications Prior to Visit  Medication Sig Dispense Refill   ALPRAZolam (XANAX) 0.25 MG tablet Take 1 tablet (0.25 mg total) by mouth 2 (two) times daily as needed for anxiety. 60 tablet 3   dutasteride (AVODART) 0.5 MG capsule Take 0.5 mg by mouth daily.     hydrocortisone 2.5 % cream Apply 1 application topically 2 (two) times daily.     lisinopril (ZESTRIL) 10 MG tablet TAKE 1 TABLET  DAILY 90 tablet 2   metoprolol succinate (TOPROL-XL) 50 MG 24 hr tablet Take 1 tablet (50 mg total) by mouth daily. 90 tablet 1   Polyethylene Glycol 3350 (MIRALAX PO) Take by mouth.     psyllium (METAMUCIL) 58.6 % packet Take 1 packet by mouth daily.     rosuvastatin (CRESTOR) 40 MG tablet Take 1 tablet (40 mg total) by mouth daily. 90 tablet 2   tamsulosin (FLOMAX) 0.4 MG CAPS capsule Take 0.4 mg by mouth daily.     traZODone (DESYREL) 50 MG tablet TAKE 1/2 TO 1 TABLET BY MOUTH DAILY AT BEDTIME 30 tablet 1   zolpidem (AMBIEN) 10 MG tablet TAKE 1 TABLET(10 MG) BY MOUTH AT BEDTIME AS NEEDED FOR SLEEP 90 tablet 0   No facility-administered medications prior to visit.    No Known Allergies  ROS     Objective:    Physical Exam  There were no vitals taken for this visit. Wt Readings from Last 3 Encounters:  12/29/21 138 lb (62.6 kg)  12/22/21 142 lb (64.4 kg)  11/12/21 137 lb (62.1 kg)   Alert and oriented and in no acute distress.  Respirations unlabored.  Speaking in complete sentences without difficulty.  Normal speech, memory and mood.    Assessment & Plan:   Problem List Items Addressed This Visit   None Visit Diagnoses     COVID-19 virus infection    -  Primary   Relevant Medications   molnupiravir EUA (LAGEVRIO) 200 MG CAPS capsule   Fatigue, unspecified type           I am having Macky Lower "Cooper" start on molnupiravir EUA. I am also having him maintain his tamsulosin, psyllium, dutasteride, Polyethylene  Glycol 3350 (MIRALAX PO), hydrocortisone, rosuvastatin, traZODone, zolpidem, metoprolol succinate, ALPRAZolam, and lisinopril.  Meds ordered this encounter  Medications   molnupiravir EUA (LAGEVRIO) 200 MG CAPS capsule    Sig: Take 4 capsules (800 mg total) by mouth 2 (two) times daily for 5 days.    Dispense:  40 capsule    Refill:  0    Order Specific Question:   Supervising Provider    Answer:   Pricilla Holm A [[id number]]   He is not in any acute distress. Molnupiravir prescribed.  Counseling on how to take the medication and potential side effects.  Counseling on CDC guidelines for quarantine and isolation. Continue with Tylenol and avoid NSAIDs due to recent MRI result of subdural hematoma.  I discussed the assessment and treatment plan with the patient. The patient was provided an opportunity to ask questions and all were answered. The patient agreed with the plan and demonstrated an understanding of the instructions.   The patient was advised to call back or seek an in-person evaluation if the symptoms worsen or if the condition fails to improve as anticipated.  I provided 15 minutes of face-to-face time during this encounter.   Harland Dingwall, NP-C [facility] [place] [phone] (phone) [phone] (fax)  [place]

## 2022-01-15 ENCOUNTER — Encounter: Payer: Self-pay | Admitting: Family Medicine

## 2022-01-28 ENCOUNTER — Encounter: Payer: Self-pay | Admitting: Neurology

## 2022-01-28 ENCOUNTER — Ambulatory Visit (INDEPENDENT_AMBULATORY_CARE_PROVIDER_SITE_OTHER): Payer: Medicare Other | Admitting: Neurology

## 2022-01-28 VITALS — BP 132/70 | HR 66 | Ht 70.0 in | Wt 142.0 lb

## 2022-01-28 DIAGNOSIS — H524 Presbyopia: Secondary | ICD-10-CM | POA: Diagnosis not present

## 2022-01-28 DIAGNOSIS — S065XAA Traumatic subdural hemorrhage with loss of consciousness status unknown, initial encounter: Secondary | ICD-10-CM

## 2022-01-28 DIAGNOSIS — H2512 Age-related nuclear cataract, left eye: Secondary | ICD-10-CM | POA: Diagnosis not present

## 2022-01-28 NOTE — Patient Instructions (Signed)
Continue current medications Continue follow-up PCP Return as needed

## 2022-01-28 NOTE — Progress Notes (Signed)
[facility]  PATIENT: [name] [name] DOB: 04-18-1933  REQUESTING CLINICIAN: [name], MD HISTORY FROM: Patient  REASON FOR VISIT: Abnormal MRI Brain    HISTORICAL  CHIEF COMPLAINT:  Chief Complaint  Patient presents with   Follow-up    RM 12 alone Pt is well, here for abnormal MRI     HISTORY OF PRESENT ILLNESS:  This is a 88 year old gentleman past medical history of hypertension, hyperlipidemia, CAD, anxiety who is presenting after abnormal MRI.  Patient reported last month he had an episode of disorientation, he felt like his was disconnected from his body which got him very anxious.  He did follow-up with his primary care doctor who ordered an MRI Brain.  At that time, he denied any headaches, stated he was not in any pain and denies any history of trauma.  MRI was completed and showed possible thin left cerebral convexity subdural hematoma without mass effect.  He denies any headaches, denies any weakness, denies any focal weakness.  He still goes to work every day, denies any fall no other complaints.     OTHER MEDICAL CONDITIONS: Hypertension, Hyperlipidemia, CAD, anxiety    REVIEW OF SYSTEMS: Full 14 system review of systems performed and negative with exception of: As noted in the HPI   ALLERGIES: No Known Allergies  HOME MEDICATIONS: Outpatient Medications Prior to Visit  Medication Sig Dispense Refill   ALPRAZolam (XANAX) 0.25 MG tablet Take 1 tablet (0.25 mg total) by mouth 2 (two) times daily as needed for anxiety. 60 tablet 3   dutasteride (AVODART) 0.5 MG capsule Take 0.5 mg by mouth daily.     hydrocortisone 2.5 % cream Apply 1 application topically 2 (two) times daily.     lisinopril (ZESTRIL) 10 MG tablet TAKE 1 TABLET DAILY 90 tablet 2   metoprolol succinate (TOPROL-XL) 50 MG 24 hr tablet Take 1 tablet (50 mg total) by mouth daily. 90 tablet 1   Polyethylene Glycol 3350 (MIRALAX PO) Take by mouth.     psyllium (METAMUCIL) 58.6  % packet Take 1 packet by mouth daily.     rosuvastatin (CRESTOR) 40 MG tablet Take 1 tablet (40 mg total) by mouth daily. 90 tablet 2   tamsulosin (FLOMAX) 0.4 MG CAPS capsule Take 0.4 mg by mouth daily.     traZODone (DESYREL) 50 MG tablet TAKE 1/2 TO 1 TABLET BY MOUTH DAILY AT BEDTIME 30 tablet 1   zolpidem (AMBIEN) 10 MG tablet TAKE 1 TABLET(10 MG) BY MOUTH AT BEDTIME AS NEEDED FOR SLEEP 90 tablet 0   No facility-administered medications prior to visit.    PAST MEDICAL HISTORY: Past Medical History:  Diagnosis Date   Anxiety    CAD (coronary artery disease)    a. s/p stent to LAD 2008;  b. abnormal ETT 11/13 with VTach => LHC pLAD 30% ISR, mCFX 30%, dRCA 60-75% which had progressed from previous study in 2010 => FFR of RCA 0.92 => med Rx    Carotid stenosis    dopplers 1/14:  0-39% bilateral ICA stenosis   Colon polyps    Colon polyps    adenomatous   Diverticular disease of colon    Hemorrhoids    external and internal   History of BPH    HTN (hypertension)    Hx of echocardiogram    a. Echo 9/11:  EF 55-60%, normal motion, grade 2 diastolic dysfunction, mild MR, mild LAE, mild RAE, PASP 33.   Hyperlipidemia    IBS (  irritable bowel syndrome)    Rhinitis    Stroke, lacunar ([place]) 08/24/2012   Old, 2011 head mri    PAST SURGICAL HISTORY: Past Surgical History:  Procedure Laterality Date   APPENDECTOMY  1956   CORONARY STENT PLACEMENT  2008    Successful PCI of the lesion in the proximal LAD using a Promus    HERNIA REPAIR Right 2005   NASAL SINUS SURGERY  1975   PERCUTANEOUS CORONARY STENT INTERVENTION (PCI-S) N/A 05/03/2012   Procedure: PERCUTANEOUS CORONARY STENT INTERVENTION (PCI-S);  Surgeon: Sherren Mocha, MD;  Location: [facility] CATH LAB;  Service: Cardiovascular;  Laterality: N/A;   Promus DES ok for 3T MRI  2008    FAMILY HISTORY: Family History  Problem Relation Age of Onset   Coronary artery disease Father    Heart attack Father    Rheum arthritis Mother     Cancer Brother        ? TYPE   Colon cancer Neg Hx    Stomach cancer Neg Hx    Pancreatic cancer Neg Hx     SOCIAL HISTORY: Social History   Socioeconomic History   Marital status: Married    Spouse name: Not on file   Number of children: 3   Years of education: 16   Highest education level: Not on file  Occupational History   Occupation: businessman, 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RETIRED  Tobacco Use   Smoking status: Never    Passive exposure: Never   Smokeless tobacco: Never  Vaping Use   Vaping Use: Never used  Substance and Sexual Activity   Alcohol use: Not Currently    Comment: 4-6 drinks/week   Drug use: No   Sexual activity: Yes    Partners: Female  Other Topics Concern   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 He has two daughters, 1 in [place] 1 in [place] (Dec '12) and a son who works with him. Several grandchildren.Reports that he spends a good deal of time at his home in [facility] which he greatly enjoys and his home in the [place]. Enjoys driving his BMW convertible when in the mountains.      ACP - Yes CPR, yes for short-term mechanical ventilation for reversible disease. Recommended [email or web address] for consideration.   Social Determinants of Health   Financial Resource Strain: Low Risk  (11/12/2021)   Overall Financial Resource Strain (CARDIA)    Difficulty of Paying Living Expenses: Not hard at all  Food Insecurity: No Food Insecurity (11/12/2021)   Hunger Vital Sign    Worried About Running Out of Food in the Last Year: Never true    Ran Out of Food in the Last Year: Never true  Transportation Needs: No Transportation Needs (11/12/2021)   PRAPARE - Hydrologist (Medical): No    Lack of Transportation (Non-Medical): No  Physical Activity: Sufficiently Active  (11/12/2021)   Exercise Vital Sign    Days of Exercise per Week: 5 days    Minutes of Exercise per Session: 30 min  Stress: No Stress Concern Present (11/12/2021)   [place]    Feeling of Stress : Not at all  Social Connections: [place] (11/12/2021)   Social Connection and Isolation Panel [NHANES]    Frequency of Communication with Friends and Family:  More than three times a week    Frequency of Social Gatherings with Friends and Family: More than three times a week    Attends Religious Services: More than 4 times per year    Active Member of [employer] or Organizations: Yes    Attends Archivist Meetings: More than 4 times per year    Marital Status: Married  Human resources officer Violence: Not At Risk (11/12/2021)   Humiliation, Afraid, Rape, and Kick questionnaire    Fear of Current or Ex-Partner: No    Emotionally Abused: No    Physically Abused: No    Sexually Abused: No    PHYSICAL EXAM  GENERAL EXAM/CONSTITUTIONAL: Vitals:  Vitals:   01/28/22 0938  BP: 132/70  Pulse: 66  Weight: 142 lb (64.4 kg)  Height: '5\' 10"'$  (1.778 m)   Body mass index is 20.37 kg/m. Wt Readings from Last 3 Encounters:  01/28/22 142 lb (64.4 kg)  12/29/21 138 lb (62.6 kg)  12/22/21 142 lb (64.4 kg)   Patient is in no distress; well developed, nourished and groomed; neck is supple  EYES: Pupils round and reactive to light, Visual fields full to confrontation, Extraocular movements intacts,   MUSCULOSKELETAL: Gait, strength, tone, movements noted in Neurologic exam below  NEUROLOGIC: MENTAL STATUS:     08/03/2017    3:37 PM  MMSE - Mini Mental State Exam  Orientation to time 5  Orientation to Place 5  Registration 3  Attention/ Calculation 5  Recall 3  Language- name 2 objects 2  Language- repeat 1  Language- follow 3 step command 3  Language- read & follow direction 1  Write a sentence 1  Copy design 1   Total score 30   awake, alert, oriented to person, place and time recent and remote memory intact normal attention and concentration language fluent, comprehension intact, naming intact fund of knowledge appropriate  CRANIAL NERVE:  2nd, 3rd, 4th, 6th - pupils equal and reactive to light, visual fields full to confrontation, extraocular muscles intact, no nystagmus 5th - facial sensation symmetric 7th - facial strength symmetric 8th - hearing intact 9th - palate elevates symmetrically, uvula midline 11th - shoulder shrug symmetric 12th - tongue protrusion midline  MOTOR:  normal bulk and tone, full strength in the BUE, BLE  SENSORY:  normal and symmetric to light touch  COORDINATION:  finger-nose-finger, fine finger movements normal  REFLEXES:  deep tendon reflexes present and symmetric  GAIT/STATION:  normal   DIAGNOSTIC DATA (LABS, IMAGING, TESTING) - I reviewed patient records, labs, notes, testing and imaging myself where available.  Lab Results  Component Value Date   WBC 7.5 12/29/2021   HGB 14.2 12/29/2021   HCT 42.1 12/29/2021   MCV 91.7 12/29/2021   PLT 194.0 12/29/2021      Component Value Date/Time   NA 136 12/29/2021 1202   NA 141 12/01/2020 1054   K 4.1 12/29/2021 1202   CL 105 12/29/2021 1202   CO2 26 12/29/2021 1202   GLUCOSE 100 (H) 12/29/2021 1202   BUN 20 12/29/2021 1202   BUN 14 12/01/2020 1054   CREATININE 1.13 12/29/2021 1202   CALCIUM 9.0 12/29/2021 1202   PROT 6.3 10/20/2020 0837   ALBUMIN 4.0 10/20/2020 0837   AST 17 10/20/2020 0837   ALT 15 10/20/2020 0837   ALKPHOS 51 10/20/2020 0837   BILITOT 0.6 10/20/2020 0837   GFRNONAA 63 (L) 05/03/2012 0801   GFRAA 73 (L) 05/03/2012 0801   Lab Results  Component  Value Date   CHOL 140 10/20/2020   HDL 69.50 10/20/2020   LDLCALC 46 10/20/2020   LDLDIRECT 132.2 03/10/2007   TRIG 121.0 10/20/2020   CHOLHDL 2 10/20/2020   No results found for: "HGBA1C" No results found for:  "VITAMINB12" Lab Results  Component Value Date   TSH 2.88 10/21/2021    MRI Brain without contrast 12/29/2021 Possible thin left cerebral convexity subdural hematoma without mass effect. No intracranial mass.  Mild chronic microvascular ischemic changes   ASSESSMENT AND PLAN  88 y.o. year old male with history of hypertension, hyperlipidemia, CAD, anxiety who is presenting after an MRI showing a possible tiny left convexity subdural hematoma without mass effect.  He denied in the past any focal neurological deficit and currently does not have any deficits, denies any headache, denies any visual change.  He denies any history of trauma.  At this time no further work-up indicated, but I advised patient to contact me or PCP if he has any new onset of headaches, any focal neurological deficit, low threshold to repeat brain imaging.  He voiced understanding. Follow up as needed    1. Subdural hematoma [facility])      Patient Instructions  Continue current medications Continue follow-up PCP Return as needed  No orders of the defined types were placed in this encounter.   No orders of the defined types were placed in this encounter.   Return if symptoms worsen or fail to improve.  I have spent a total of 50 minutes dedicated to this patient today, preparing to see patient, performing a medically appropriate examination and evaluation, ordering tests and/or medications and procedures, and counseling and educating the patient/family/caregiver; independently interpreting result and communicating results to the family/patient/caregiver; reviewing the images with patient himself, and documenting clinical information in the electronic medical record.   Alric Ran, MD 01/28/2022, 11:26 AM  [facility] [address], [place], [place] [address] [phone]

## 2022-01-29 ENCOUNTER — Other Ambulatory Visit: Payer: Self-pay | Admitting: Internal Medicine

## 2022-01-29 DIAGNOSIS — F5104 Psychophysiologic insomnia: Secondary | ICD-10-CM

## 2022-01-29 MED ORDER — ZOLPIDEM TARTRATE 10 MG PO TABS: 10.0000 mg | ORAL_TABLET | Freq: Every evening | ORAL | 0 refills | Status: DC | PRN

## 2022-01-29 NOTE — Progress Notes (Unsigned)
  [place] Phone: [phone] Subjective:   [name] [name], am serving as a scribe for Dr. Hulan Saas.  I'm seeing this patient by the request  of:  [name], MD  CC: Buttocks pain follow-up  CXK:[id number]  12/22/2021 Chronic problem with exacerbation.  Worsening pain.  Has had difficulty with the gluteal tendon previously and has responded extremely well to PRP.  Patient given injection today and hopefully this will help out significantly.  Follow-up again in 6 weeks and will consider possibly repeating the PRP  Update 02/03/2022 [name] [name] is a 88 y.o. male coming in with complaint of B hip pain. Patient states that his L hip pain was worse over the weekend. Today is his pain. Patient did have COVID since last visit and did have some groin pain.    Objective  Blood pressure 112/68, pulse 77, height '5\' 10"'$  (1.778 m), weight 144 lb (65.3 kg), SpO2 97 %.   General: [name] apparent distress alert and oriented x3 mood and affect normal, dressed appropriately.  HEENT: Pupils equal, extraocular movements intact  Respiratory: Patient's speak in full sentences and does not appear short of breath  Cardiovascular: [name] lower extremity edema, non tender, [name] erythema   Procedure: Real-time Ultrasound Guided Injection of gluteal tendon sheath Device: GE Logiq Q7 Ultrasound guided injection is preferred based studies that show increased duration, increased effect, greater accuracy, decreased procedural pain, increased response rate, and decreased cost with ultrasound guided versus blind injection.  Verbal informed consent obtained.  Time-out conducted.  Noted [name] overlying erythema, induration, or other signs of local infection.  Skin prepped in a sterile fashion.  Local anesthesia: Topical Ethyl chloride.  With sterile technique and under real time ultrasound guidance: With a 21-gauge 2 inch needle injected with 0.5  cc of 0.5% Marcaine and then injected with 5 cc of PRP Completed without difficulty  Advised to call if fevers/chills, erythema, induration, drainage, or persistent bleeding.  Impression: Technically successful ultrasound guided injection.   Impression and Recommendations:    The above documentation has been reviewed and is accurate and complete Lyndal Pulley, DO

## 2022-02-02 DIAGNOSIS — R3915 Urgency of urination: Secondary | ICD-10-CM | POA: Diagnosis not present

## 2022-02-03 ENCOUNTER — Ambulatory Visit: Payer: Self-pay | Admitting: Family Medicine

## 2022-02-03 ENCOUNTER — Encounter: Payer: Self-pay | Admitting: Family Medicine

## 2022-02-03 ENCOUNTER — Ambulatory Visit: Payer: Self-pay

## 2022-02-03 VITALS — BP 112/68 | HR 77 | Ht 70.0 in | Wt 144.0 lb

## 2022-02-03 DIAGNOSIS — M25552 Pain in left hip: Secondary | ICD-10-CM

## 2022-02-03 DIAGNOSIS — M25551 Pain in right hip: Secondary | ICD-10-CM

## 2022-02-03 DIAGNOSIS — S76019A Strain of muscle, fascia and tendon of unspecified hip, initial encounter: Secondary | ICD-10-CM

## 2022-02-03 NOTE — Assessment & Plan Note (Signed)
Repeat PRP given today.  Post PRP instructions given again.  Patient did respond well previously and hopefully will again.  Follow-up with me again in 6 weeks

## 2022-02-03 NOTE — Patient Instructions (Signed)
No ice or IBU for 3 days Heat and Tylenol are ok See me again in 6 weeks 

## 2022-02-16 ENCOUNTER — Encounter: Payer: Self-pay | Admitting: Cardiovascular Disease

## 2022-02-18 ENCOUNTER — Other Ambulatory Visit: Payer: Self-pay

## 2022-02-18 MED ORDER — ROSUVASTATIN CALCIUM 40 MG PO TABS: 40.0000 mg | ORAL_TABLET | Freq: Every day | ORAL | 2 refills | Status: DC

## 2022-02-18 NOTE — Telephone Encounter (Signed)
Pt's medication was sent to pt's pharmacy as requested. Confirmation received.  °

## 2022-03-02 DIAGNOSIS — R3915 Urgency of urination: Secondary | ICD-10-CM | POA: Diagnosis not present

## 2022-03-11 NOTE — Progress Notes (Signed)
[name] [place] Phone: [phone] Subjective:   [name], am serving as a scribe for Dr. Hulan Saas.  I'm seeing this patient by the request  of:  [name], MD  CC: bilateral hip   [id number]  02/03/2022 Repeat PRP given today.  Post PRP instructions given again.  Patient did respond well previously and hopefully will again.  Follow-up with me again in 6 weeks  Update 03/15/2022 [name] is a 88 y.o. male coming in with complaint of B hip pain. PRP given last visit. Patient states that he thinks the PRP has helped. Hip is better, but can still feel it when walking long distances or walking up a hill. Patient states since having COVID in August he developed this left sided groin pain. The pain is still there but nothing like it was during [place]. Additionally patient states that if he sits at his desk for too long and he goes to get up he gets a stiff right knee.       Past Medical History:  Diagnosis Date   Anxiety    CAD (coronary artery disease)    a. s/p stent to LAD 2008;  b. abnormal ETT 11/13 with VTach => LHC pLAD 30% ISR, mCFX 30%, dRCA 60-75% which had progressed from previous study in 2010 => FFR of RCA 0.92 => med Rx    Carotid stenosis    dopplers 1/14:  0-39% bilateral ICA stenosis   Colon polyps    Colon polyps    adenomatous   Diverticular disease of colon    Hemorrhoids    external and internal   History of BPH    HTN (hypertension)    Hx of echocardiogram    a. Echo 9/11:  EF 55-60%, normal motion, grade 2 diastolic dysfunction, mild MR, mild LAE, mild RAE, PASP 33.   Hyperlipidemia    IBS (irritable bowel syndrome)    Rhinitis    Stroke, lacunar ([place]) 08/24/2012   Old, 2011 head mri   Past Surgical History:  Procedure Laterality Date   APPENDECTOMY  1956   CORONARY STENT PLACEMENT  2008    Successful PCI of the lesion in the proximal LAD using a Promus     HERNIA REPAIR Right 2005   NASAL SINUS SURGERY  1975   PERCUTANEOUS CORONARY STENT INTERVENTION (PCI-S) N/A 05/03/2012   Procedure: PERCUTANEOUS CORONARY STENT INTERVENTION (PCI-S);  Surgeon: Sherren Mocha, MD;  [facility] C[facility] LAB;  Service: Cardiovascular;  Laterality: N/A;   Promus DES ok for 3T MRI  2008   Social History   Socioeconomic History   Marital status: Married    Spouse name: Not on file   Number of children: 3   Years of education: 16   Highest education level: Not on file  Occupational History   Occupation: businessman, 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RETIRED  Tobacco Use   Smoking status: Never    Passive exposure: Never   Smokeless tobacco: Never  Vaping Use   Vaping Use: Never used  Substance and Sexual Activity   Alcohol use: Not Currently    Comment: 4-6 drinks/week   Drug use: No   Sexual activity: Yes    Partners: Female  Other Topics Concern   Not on file  Social History Narrative   HSG, 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 He has two daughters, 1 in [place] 1 in [place] (Dec '12) and a son who works with him. Several grandchildren.Reports that he spends a good deal of time at his home in [facility] which he greatly enjoys and his home in the [place]. Enjoys driving his BMW convertible when in the mountains.      ACP - Yes CPR, yes for short-term mechanical ventilation for reversible disease. Recommended [email or web address] for consideration.   Social Determinants of Health   Financial Resource Strain: Low Risk  (11/12/2021)   Overall Financial Resource Strain (CARDIA)    Difficulty of Paying Living Expenses: Not hard at all  Food Insecurity: No Food Insecurity (11/12/2021)   Hunger Vital Sign    Worried About Running Out of Food in the Last Year: Never true    Ran Out of Food in the Last Year: Never true  Transportation Needs: No  Transportation Needs (11/12/2021)   PRAPARE - Hydrologist (Medical): No    Lack of Transportation (Non-Medical): No  Physical Activity: Sufficiently Active (11/12/2021)   Exercise Vital Sign    Days of Exercise per Week: 5 days    Minutes of Exercise per Session: 30 min  Stress: No Stress Concern Present (11/12/2021)   [place]    Feeling of Stress : Not at all  Social Connections: [place] (11/12/2021)   Social Connection and Isolation Panel [NHANES]    Frequency of Communication with Friends and Family: More than three times a week    Frequency of Social Gatherings with Friends and Family: More than three times a week    Attends Religious Services: More than 4 times per year    Active Member of [employer] or Organizations: Yes    Attends Music therapist: More than 4 times per year    Marital Status: Married   No Known Allergies Family History  Problem Relation Age of Onset   Coronary artery disease Father    Heart attack Father    Rheum arthritis Mother    Cancer Brother        ? TYPE   Colon cancer Neg Hx    Stomach cancer Neg Hx    Pancreatic cancer Neg Hx      Current Outpatient Medications (Cardiovascular):    lisinopril (ZESTRIL) 10 MG tablet, TAKE 1 TABLET DAILY   metoprolol succinate (TOPROL-XL) 50 MG 24 hr tablet, Take 1 tablet (50 mg total) by mouth daily.   rosuvastatin (CRESTOR) 40 MG tablet, Take 1 tablet (40 mg total) by mouth daily.     Current Outpatient Medications (Other):    ALPRAZolam (XANAX) 0.25 MG tablet, Take 1 tablet (0.25 mg total) by mouth 2 (two) times daily as needed for anxiety.   dutasteride (AVODART) 0.5 MG capsule, Take 0.5 mg by mouth daily.   hydrocortisone 2.5 % cream, Apply 1 application topically 2 (two) times daily.   Polyethylene Glycol 3350 (MIRALAX PO), Take by mouth.   psyllium (METAMUCIL) 58.6 % packet, Take 1  packet by mouth daily.   tamsulosin (FLOMAX) 0.4 MG CAPS capsule, Take 0.4 mg by mouth daily.   traZODone (DESYREL) 50 MG tablet, TAKE 1/2 TO 1 TABLET BY MOUTH DAILY AT BEDTIME   zolpidem (AMBIEN) 10 MG tablet, Take 1 tablet (10 mg total) by mouth at bedtime as needed for sleep.   Objective  Blood pressure 110/60, pulse 96, height '5\' 10"'$  (1.778 m), weight 140 lb (  63.5 kg), SpO2 97 %.   General: No apparent distress alert and oriented x3 mood and affect normal, dressed appropriately.  HEENT: Pupils equal, extraocular movements intact  Respiratory: Patient's speak in full sentences and does not appear short of breath  Cardiovascular: No lower extremity edema, non tender, no erythema  Patient's left hip still severe tenderness to palpation over the greater trochanteric area. Left hip exam does have some limited internal rotation of the hip.   Impression and Recommendations:     The above documentation has been reviewed and is accurate and complete Lyndal Pulley, DO

## 2022-03-15 ENCOUNTER — Ambulatory Visit: Payer: Medicare Other

## 2022-03-15 ENCOUNTER — Ambulatory Visit (INDEPENDENT_AMBULATORY_CARE_PROVIDER_SITE_OTHER): Payer: Medicare Other | Admitting: Family Medicine

## 2022-03-15 VITALS — BP 110/60 | HR 96 | Ht 70.0 in | Wt 140.0 lb

## 2022-03-15 DIAGNOSIS — M25552 Pain in left hip: Secondary | ICD-10-CM

## 2022-03-15 DIAGNOSIS — R1032 Left lower quadrant pain: Secondary | ICD-10-CM | POA: Diagnosis not present

## 2022-03-15 DIAGNOSIS — S76019A Strain of muscle, fascia and tendon of unspecified hip, initial encounter: Secondary | ICD-10-CM

## 2022-03-15 DIAGNOSIS — I2510 Atherosclerotic heart disease of native coronary artery without angina pectoris: Secondary | ICD-10-CM

## 2022-03-15 IMAGING — DX DG PELVIS 1-2V
1 series · 1 of 1 positions shown · non-contrast
Comparison: None.

CLINICAL DATA: Chronic intermittent pelvic pain.

EXAM:
PELVIS - 1-2 VIEW

[pelvis ap]
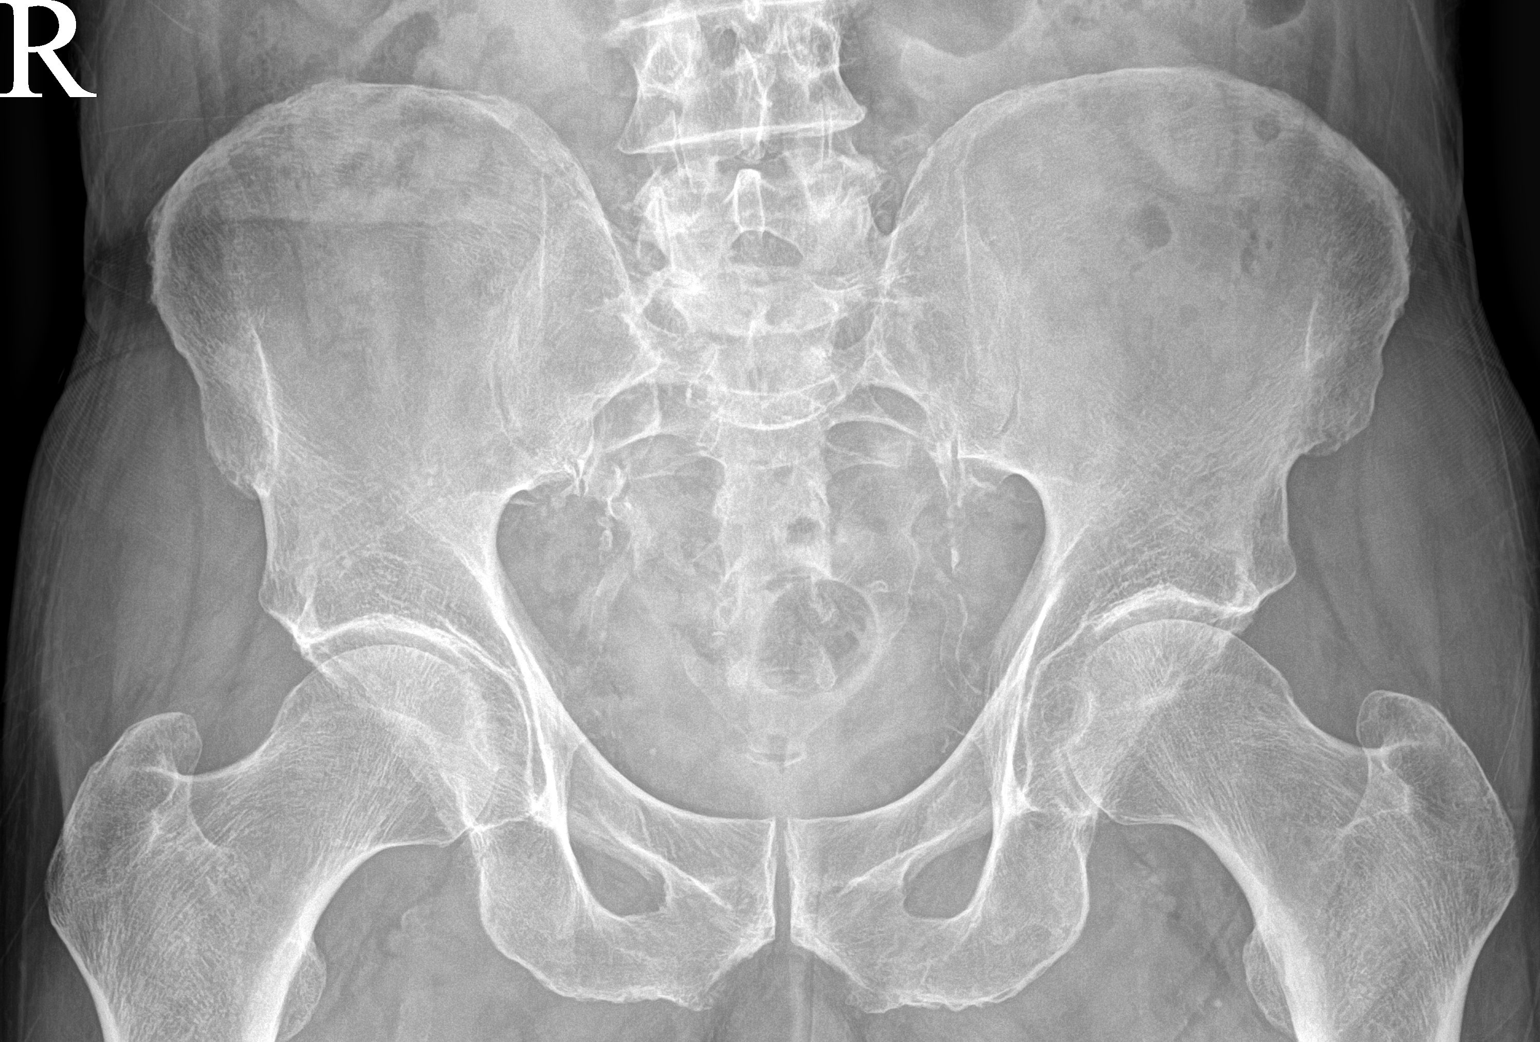

[1 of 1 positions shown; findings below may reference images not displayed]

FINDINGS: The cortical margins of the bony pelvis are intact. No fracture.
Pubic symphysis and sacroiliac joints are congruent. Both femoral
heads are well-seated in the respective acetabula. Hip joint spaces
are preserved. Minimal enthesopathic change in the hamstring
insertions. No evidence of focal bone lesion or bone destruction.
IMPRESSION: Minimal enthesopathic change in the hamstring insertions. Otherwise
unremarkable radiograph of the pelvis.

## 2022-03-15 IMAGING — DX DG LUMBAR SPINE 2-3V
3 series · 3 of 3 positions shown · non-contrast
Comparison: Radiograph 01/04/2019

CLINICAL DATA: Chronic intermittent lumbar pain.

EXAM:
LUMBAR SPINE - 2-3 VIEW

[l-spine ap]
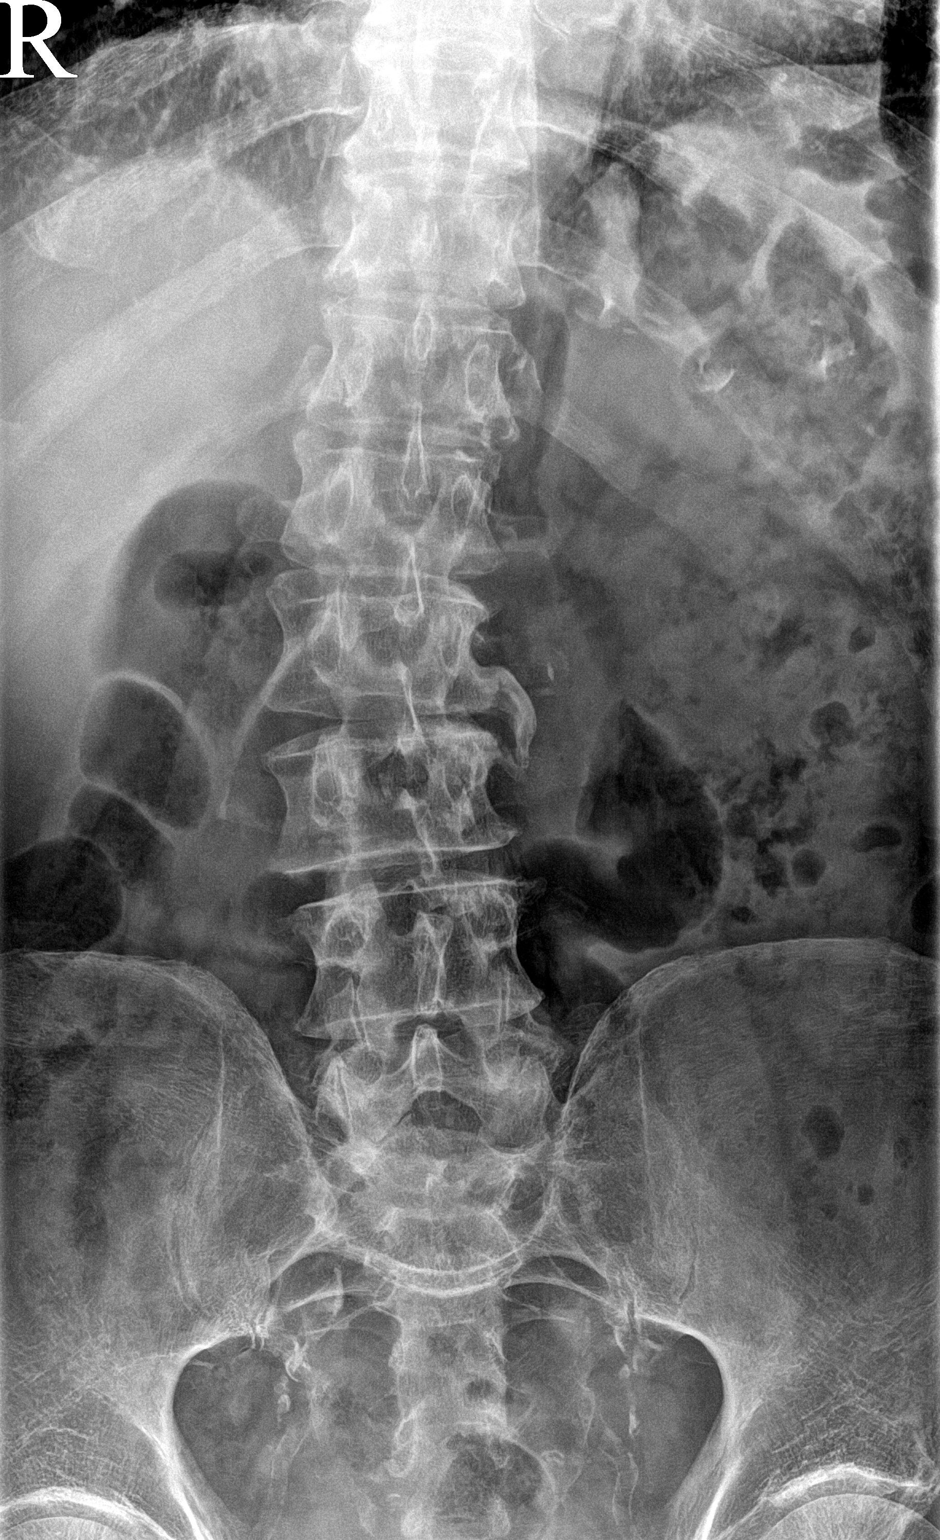

[l-spine lateral (1 of 2)]
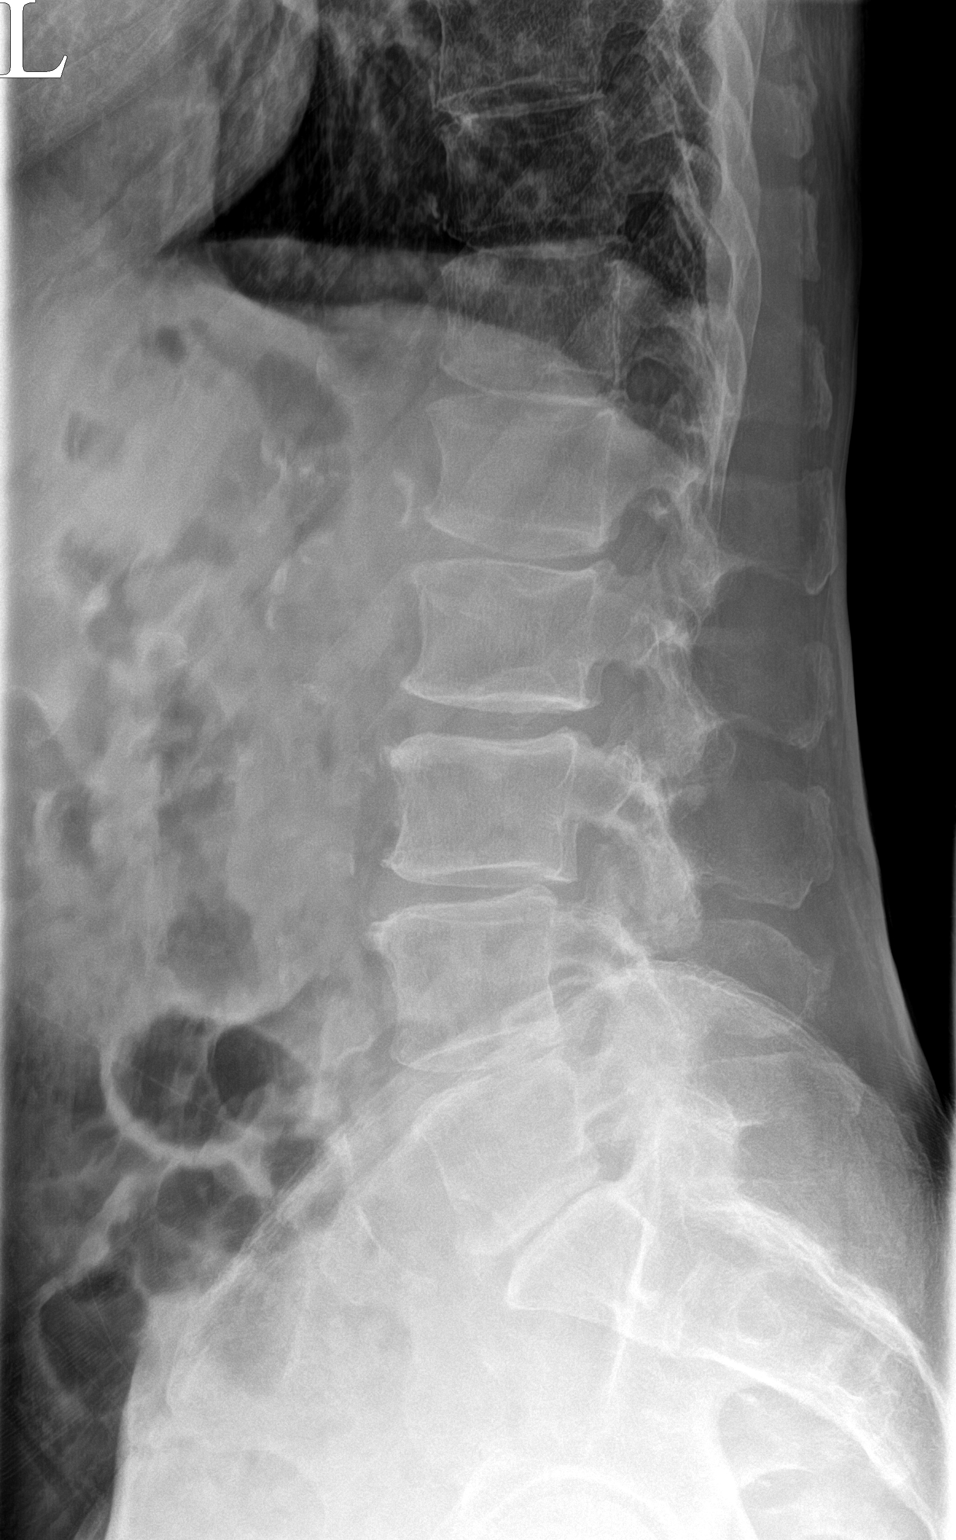

[l-spine lateral (2 of 2)]
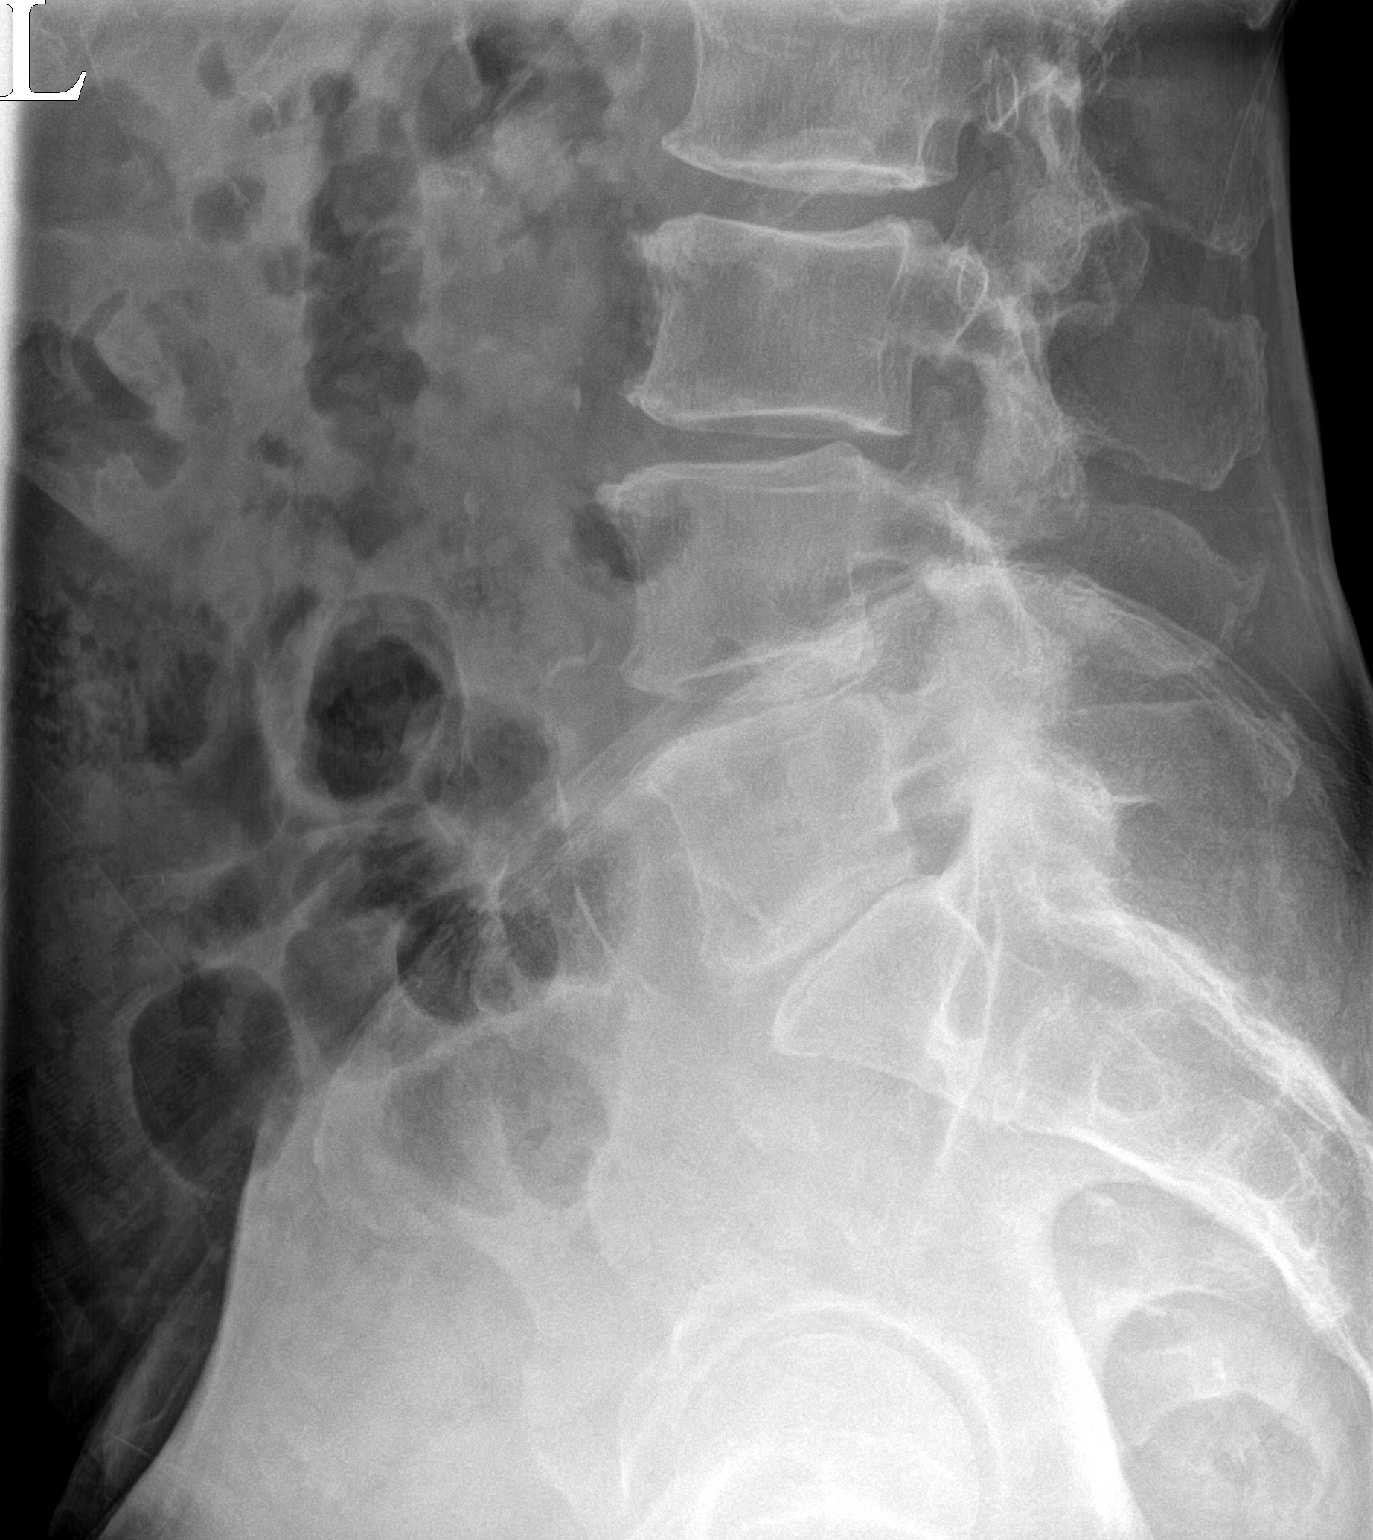

[3 of 3 positions shown; findings below may reference images not displayed]

FINDINGS: Unchanged broad-based dextroscoliotic curvature. Similar 2-3 mm
retrolisthesis of L2 on L3 and L3 on L4. Mild anterior wedging of
T12 and L1 vertebral bodies are stable from prior exam. No evidence
of acute or interval fracture. Diffuse endplate spurring with disc
space narrowing at L3-L4, L4-L5 and L5-S1. L4-L5 and L5-S1 facet
hypertrophy. No evidence of fracture or focal bone lesion.
Sacroiliac joints are congruent.
IMPRESSION: 1. Stable broad-based dextroscoliotic curvature and multilevel
degenerative disc disease.
2. Stable lower lumbar facet hypertrophy.
3. Unchanged mild anterior wedging of T12 and L1 vertebral bodies.

## 2022-03-15 NOTE — Patient Instructions (Addendum)
Good to see you  Get x-ray on your way out Give my best to your better half Remember how to stand up Have Happy Holidays Follow up in 7-8 week just in case

## 2022-03-15 NOTE — Assessment & Plan Note (Signed)
I believe patient should do relatively well.  Would like to get a 12 weeks until we see any other significant treatment.  I would like patient to come back in in 6 to 8 weeks to see how patient is responding.

## 2022-03-17 NOTE — Progress Notes (Signed)
Patient ID: [name], male   DOB: 11/17/1932, 88 y.o.   MRN: [id number]     88 y.o. history of CAD. Stent to LAD in 2008.  ETT in 2013 abnormal with NSVT. Cath 05/01/12 30% ISR LaD and distal RCA 60-75% with FFR CT .92 Rx medically Myovue done 04/27/17 reviewed and normal with no ischemia study not gated due to PAC;s EF normal echo 06/25/15 55-60% LA only mildly dilated Had another myovue 04/22/20 which was normal with no ischemia EF estimtated 53%  Has Occular migraines Been going on for over 3-4 years  Seen by neurology And MRI/MRA no abnormality and EEG no epileptic foci  01/11/14  1-39% bilateral carotid disease   Has lower lumbar disc issue and bilateral hip pain Has had repeat injection by [name] Also with right [facility] joint arthritis most recent injection 09/06/21 Has had Platelet Rich Plasma injections for his hips as well   03/2020 Went to [place] to visit his cousin [name] who is a famous Engineer, agricultural in [name] [name] has a hypochondriac for husband They moved onto my street at [name] last year  Daughter in [place] not working as [name] but he likes her husband better Son works with him and got married for first time at age 75 to pharmacist at [name]  Had a syncopal episode July  4th. 2022 Started with visual changes then BP low  80's with pre syncope  Felt weak for a couple hous. 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  All other systems reviewed and negative  General: BP 130/64    Pulse 67   Ht '5\' 10"'$  (1.778 m)   Wt 143 lb 6.4 oz (65 kg)   SpO2 97%   BMI 20.58 kg/m  Affect appropriate Healthy:  appears stated age 53: normal Neck supple with no adenopathy JVP normal no bruits no thyromegaly Lungs clear with no wheezing and good diaphragmatic motion Heart:  S1/S2 no murmur, no rub, gallop or click PMI normal Abdomen: benighn, BS positve, no tenderness, no AAA no bruit.  No HSM or HJR Distal pulses intact with no bruits No edema Neuro non-focal Skin warm and dry No muscular weakness     Current Outpatient Medications  Medication Sig Dispense Refill   ALPRAZolam (XANAX) 0.25 MG tablet Take 1 tablet (0.25 mg total) by mouth 2 (two) times daily as needed for anxiety. 60 tablet 3   dutasteride (AVODART) 0.5 MG capsule Take 0.5 mg by mouth daily.     hydrocortisone 2.5 % cream Apply 1 application topically 2 (two) times daily.     lisinopril (ZESTRIL) 10 MG tablet TAKE 1 TABLET DAILY 90 tablet 2   metoprolol succinate (TOPROL-XL) 50 MG 24 hr tablet Take 1 tablet (50 mg total) by mouth daily. 90 tablet 1   Polyethylene Glycol 3350 (MIRALAX PO) Take by mouth.     psyllium (METAMUCIL) 58.6 % packet Take 1 packet by mouth daily.  rosuvastatin (CRESTOR) 40 MG tablet Take 1 tablet (40 mg total) by mouth daily. 90 tablet 2   tamsulosin (FLOMAX) 0.4 MG CAPS capsule Take 0.4 mg by mouth daily.     traZODone (DESYREL) 50 MG tablet TAKE 1/2 TO 1 TABLET BY MOUTH DAILY AT BEDTIME 30 tablet 1   zolpidem (AMBIEN) 10 MG tablet Take 1 tablet (10 mg total) by mouth at bedtime as needed for sleep. 90 tablet 0   No current facility-administered medications for this visit.    Allergies  Patient has no known allergies.  Electrocardiogram:  03/25/2022  SR rate 67 RBBB  no changes   Assessment and Plan  CAD: Stable with no angina and good activity level.  Continue medical Rx Stent LAD 2008  Moderate distal RCA with normal flow wire 04/2012  Normal non ischemic myovue  04/22/20 continue medical Rx   HLD : on generic crestor now  Lab Results  Component Value Date   LDLCALC 46 10/20/2020    GERD:with constipation not helped with Miralax, ducolox or Linzess f/u [name]:  Chronic on ambien f/u [name]   Visual Disturbance:  MRI/MRA normal EEG normal has been seen by neurology ? Occular migraines   Ortho:  f/u [name] for bilateral hip pain post injections MRI L3,4 disc bulge as well as right AC joint arthritis   Syncope:   Normal myovue 04/22/20 Labs ok Monitor with mostly atrial ectopy continue beta blocker    F/U with me in 6 months   [name]

## 2022-03-25 ENCOUNTER — Encounter: Payer: Self-pay | Admitting: Cardiovascular Disease

## 2022-03-25 ENCOUNTER — Ambulatory Visit: Payer: Medicare Other | Attending: Cardiovascular Disease | Admitting: Cardiovascular Disease

## 2022-03-25 VITALS — BP 130/64 | HR 67 | Ht 70.0 in | Wt 143.4 lb

## 2022-03-25 DIAGNOSIS — I2510 Atherosclerotic heart disease of native coronary artery without angina pectoris: Secondary | ICD-10-CM | POA: Diagnosis not present

## 2022-03-25 DIAGNOSIS — R002 Palpitations: Secondary | ICD-10-CM | POA: Insufficient documentation

## 2022-03-25 DIAGNOSIS — E782 Mixed hyperlipidemia: Secondary | ICD-10-CM | POA: Diagnosis not present

## 2022-03-25 NOTE — Patient Instructions (Addendum)
Medication Instructions:  Your physician recommends that you continue on your current medications as directed. Please refer to the Current Medication list given to you today.  *If you need a refill on your cardiac medications before your next appointment, please call your pharmacy*  Lab Work: If you have labs (blood work) drawn today and your tests are completely normal, you will receive your results only by: [place] (if you have MyChart) OR A paper copy in the mail If you have any lab test that is abnormal or we need to change your treatment, we will call you to review the results.   Testing/Procedures: None ordered today.  Follow-Up: At [facility], you and your health needs are our priority.  As part of our continuing mission to provide you with exceptional heart care, we have created designated Provider Care Teams.  These Care Teams include your primary Cardiologist (physician) and Advanced Practice Providers (APPs -  Physician Assistants and Nurse Practitioners) who all work together to provide you with the care you need, when you need it.  We recommend signing up for the patient portal called "MyChart".  Sign up information is provided on this After Visit Summary.  MyChart is used to connect with patients for Virtual Visits (Telemedicine).  Patients are able to view lab/test results, encounter notes, upcoming appointments, etc.  Non-urgent messages can be sent to your provider as well.   To learn more about what you can do with MyChart, go to [email or web address].    Your next appointment:   6 months  The format for your next appointment:   In Person  Provider:   [name], MD      Important Information About Sugar

## 2022-03-30 DIAGNOSIS — R3915 Urgency of urination: Secondary | ICD-10-CM | POA: Diagnosis not present

## 2022-04-13 DIAGNOSIS — R351 Nocturia: Secondary | ICD-10-CM | POA: Diagnosis not present

## 2022-04-13 DIAGNOSIS — R3915 Urgency of urination: Secondary | ICD-10-CM | POA: Diagnosis not present

## 2022-04-22 MED ORDER — LISINOPRIL 10 MG PO TABS: 10.0000 mg | ORAL_TABLET | Freq: Every day | ORAL | 0 refills | Status: DC

## 2022-04-23 ENCOUNTER — Encounter: Payer: Self-pay | Admitting: Cardiovascular Disease

## 2022-04-23 MED ORDER — LISINOPRIL 10 MG PO TABS: 10.0000 mg | ORAL_TABLET | Freq: Every day | ORAL | 0 refills | Status: DC

## 2022-04-27 DIAGNOSIS — R3915 Urgency of urination: Secondary | ICD-10-CM | POA: Diagnosis not present

## 2022-04-30 NOTE — Progress Notes (Deleted)
[name] Phone: [phone] Subjective:    I'm seeing this patient by the request  of:  [name], MD  CC:   [id number]  03/15/2022 I believe patient should do relatively well. Would like to get a 12 weeks until we see any other significant treatment. I would like patient to come back in in 6 to 8 weeks to see how patient is responding.   Update 05/05/2022 [name] is a 88 y.o. male coming in with complaint of B hip pain. Patient states        Past Medical History:  Diagnosis Date   Anxiety    CAD (coronary artery disease)    a. s/p stent to LAD 2008;  b. abnormal ETT 11/13 with VTach => LHC pLAD 30% ISR, mCFX 30%, dRCA 60-75% which had progressed from previous study in 2010 => FFR of RCA 0.92 => med Rx    Carotid stenosis    dopplers 1/14:  0-39% bilateral ICA stenosis   Colon polyps    Colon polyps    adenomatous   Diverticular disease of colon    Hemorrhoids    external and internal   History of BPH    HTN (hypertension)    Hx of echocardiogram    a. Echo 9/11:  EF 55-60%, normal motion, grade 2 diastolic dysfunction, mild MR, mild LAE, mild RAE, PASP 33.   Hyperlipidemia    IBS (irritable bowel syndrome)    Rhinitis    Stroke, lacunar ([place]) 08/24/2012   Old, 2011 head mri   Past Surgical History:  Procedure Laterality Date   APPENDECTOMY  1956   CORONARY STENT PLACEMENT  2008    Successful PCI of the lesion in the proximal LAD using a Promus    HERNIA REPAIR Right 2005   NASAL SINUS SURGERY  1975   PERCUTANEOUS CORONARY STENT INTERVENTION (PCI-S) N/A 05/03/2012   Procedure: PERCUTANEOUS CORONARY STENT INTERVENTION (PCI-S);  Surgeon: Sherren Mocha, MD;  Location: [facility] CATH LAB;  Service: Cardiovascular;  Laterality: N/A;   Promus DES ok for 3T MRI  2008   Social History   Socioeconomic History   Marital status: Married    Spouse name: Not on file   Number of children:  3   Years of education: 16   Highest education level: Not on file  Occupational History   Occupation: businessman, 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RETIRED  Tobacco Use   Smoking status: Never    Passive exposure: Never   Smokeless tobacco: Never  Vaping Use   Vaping Use: Never used  Substance and Sexual Activity   Alcohol use: Not Currently    Comment: 4-6 drinks/week   Drug use: No   Sexual activity: Yes    Partners: Female  Other Topics Concern   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 He has two daughters, 1 in [place] 1 in [place] (Dec '12) and a son who works with him. Several grandchildren.Reports that he spends a good deal of time at his home in [place] which he greatly enjoys and his home in the [place]. Enjoys driving his BMW convertible when in the mountains.      ACP - Yes CPR, yes for short-term mechanical ventilation for reversible disease. Recommended [email or web address] for consideration.   Social  Determinants of Health   Financial Resource Strain: Low Risk  (11/12/2021)   Overall Financial Resource Strain (CARDIA)    Difficulty of Paying Living Expenses: Not hard at all  Food Insecurity: No Food Insecurity (11/12/2021)   Hunger Vital Sign    Worried About Running Out of Food in the Last Year: Never true    Ran Out of Food in the Last Year: Never true  Transportation Needs: No Transportation Needs (11/12/2021)   PRAPARE - Hydrologist (Medical): No    Lack of Transportation (Non-Medical): No  Physical Activity: Sufficiently Active (11/12/2021)   Exercise Vital Sign    Days of Exercise per Week: 5 days    Minutes of Exercise per Session: 30 min  Stress: No Stress Concern Present (11/12/2021)   [place]    Feeling of  Stress : Not at all  Social Connections: [place] (11/12/2021)   Social Connection and Isolation Panel [NHANES]    Frequency of Communication with Friends and Family: More than three times a week    Frequency of Social Gatherings with Friends and Family: More than three times a week    Attends Religious Services: More than 4 times per year    Active Member of [employer] or Organizations: Yes    Attends Music therapist: More than 4 times per year    Marital Status: Married   No Known Allergies Family History  Problem Relation Age of Onset   Coronary artery disease Father    Heart attack Father    Rheum arthritis Mother    Cancer Brother        ? TYPE   Colon cancer Neg Hx    Stomach cancer Neg Hx    Pancreatic cancer Neg Hx      Current Outpatient Medications (Cardiovascular):    lisinopril (ZESTRIL) 10 MG tablet, Take 1 tablet (10 mg total) by mouth daily.   metoprolol succinate (TOPROL-XL) 50 MG 24 hr tablet, Take 1 tablet (50 mg total) by mouth daily.   rosuvastatin (CRESTOR) 40 MG tablet, Take 1 tablet (40 mg total) by mouth daily.     Current Outpatient Medications (Other):    ALPRAZolam (XANAX) 0.25 MG tablet, Take 1 tablet (0.25 mg total) by mouth 2 (two) times daily as needed for anxiety.   dutasteride (AVODART) 0.5 MG capsule, Take 0.5 mg by mouth daily.   hydrocortisone 2.5 % cream, Apply 1 application topically 2 (two) times daily.   Polyethylene Glycol 3350 (MIRALAX PO), Take by mouth.   psyllium (METAMUCIL) 58.6 % packet, Take 1 packet by mouth daily.   tamsulosin (FLOMAX) 0.4 MG CAPS capsule, Take 0.4 mg by mouth daily.   traZODone (DESYREL) 50 MG tablet, TAKE 1/2 TO 1 TABLET BY MOUTH DAILY AT BEDTIME   zolpidem (AMBIEN) 10 MG tablet, Take 1 tablet (10 mg total) by mouth at bedtime as needed for sleep.   Reviewed prior external information including notes and imaging from  primary care provider As well as notes that were available from care  everywhere and other healthcare systems.  Past medical history, social, surgical and family history all reviewed in electronic medical record.  No pertanent information unless stated regarding to the chief complaint.   Review of Systems:  No headache, visual changes, nausea, vomiting, diarrhea, constipation, dizziness, abdominal pain, skin rash, fevers, chills, night sweats, weight loss, swollen lymph nodes, body aches, joint swelling, chest pain, shortness of  breath, mood changes. POSITIVE muscle aches  Objective  There were no vitals taken for this visit.   General: No apparent distress alert and oriented x3 mood and affect normal, dressed appropriately.  HEENT: Pupils equal, extraocular movements intact  Respiratory: Patient's speak in full sentences and does not appear short of breath  Cardiovascular: No lower extremity edema, non tender, no erythema      Impression and Recommendations:

## 2022-05-04 ENCOUNTER — Other Ambulatory Visit: Payer: Self-pay | Admitting: Internal Medicine

## 2022-05-04 DIAGNOSIS — F5104 Psychophysiologic insomnia: Secondary | ICD-10-CM

## 2022-05-05 ENCOUNTER — Ambulatory Visit: Payer: Medicare Other | Admitting: Family Medicine

## 2022-05-12 ENCOUNTER — Other Ambulatory Visit: Payer: Self-pay | Admitting: Cardiovascular Disease

## 2022-05-19 DIAGNOSIS — L570 Actinic keratosis: Secondary | ICD-10-CM | POA: Diagnosis not present

## 2022-05-19 DIAGNOSIS — D225 Melanocytic nevi of trunk: Secondary | ICD-10-CM | POA: Diagnosis not present

## 2022-05-19 DIAGNOSIS — D044 Carcinoma in situ of skin of scalp and neck: Secondary | ICD-10-CM | POA: Diagnosis not present

## 2022-05-19 DIAGNOSIS — Z85820 Personal history of malignant melanoma of skin: Secondary | ICD-10-CM | POA: Diagnosis not present

## 2022-05-19 DIAGNOSIS — Z85828 Personal history of other malignant neoplasm of skin: Secondary | ICD-10-CM | POA: Diagnosis not present

## 2022-05-19 DIAGNOSIS — C44729 Squamous cell carcinoma of skin of left lower limb, including hip: Secondary | ICD-10-CM | POA: Diagnosis not present

## 2022-05-25 DIAGNOSIS — R3915 Urgency of urination: Secondary | ICD-10-CM | POA: Diagnosis not present

## 2022-06-04 DIAGNOSIS — H532 Diplopia: Secondary | ICD-10-CM | POA: Diagnosis not present

## 2022-06-10 ENCOUNTER — Telehealth: Payer: Self-pay | Admitting: Cardiovascular Disease

## 2022-06-10 NOTE — Telephone Encounter (Signed)
Pt c/o BP issue: STAT if pt c/o blurred vision, one-sided weakness or slurred speech  1. What are your last 5 BP readings? 104/46  2. Are you having any other symptoms (ex. Dizziness, headache, blurred vision, passed out)? No  3. What is your BP issue? Pt states that BP has been running low since Tuesday night.

## 2022-06-10 NOTE — Telephone Encounter (Signed)
Left message for patient to call back. Per Dr. Johnsie Cancel okay for patient to stop his lisinopril. Will discuss when patient calls back.

## 2022-06-10 NOTE — Telephone Encounter (Signed)
Patient called back. Informed patient to stop his lisinopril and see if that helps. Informed patient that if his BP is too low he can drink some fluids to see if that helps. Patient informed me that he has an appointment tomorrow to discuss with Dr. Johnsie Cancel, not sure if patient needs to be seen. Will send message to Dr. Johnsie Cancel.

## 2022-06-11 ENCOUNTER — Ambulatory Visit: Payer: Medicare Other | Attending: Cardiovascular Disease | Admitting: Cardiovascular Disease

## 2022-06-11 ENCOUNTER — Encounter: Payer: Self-pay | Admitting: Cardiovascular Disease

## 2022-06-11 VITALS — BP 98/44 | HR 70 | Ht 70.0 in | Wt 143.8 lb

## 2022-06-11 DIAGNOSIS — E782 Mixed hyperlipidemia: Secondary | ICD-10-CM | POA: Diagnosis not present

## 2022-06-11 DIAGNOSIS — R002 Palpitations: Secondary | ICD-10-CM | POA: Diagnosis not present

## 2022-06-11 DIAGNOSIS — R55 Syncope and collapse: Secondary | ICD-10-CM | POA: Diagnosis not present

## 2022-06-11 DIAGNOSIS — I209 Angina pectoris, unspecified: Secondary | ICD-10-CM | POA: Diagnosis not present

## 2022-06-11 DIAGNOSIS — I25118 Atherosclerotic heart disease of native coronary artery with other forms of angina pectoris: Secondary | ICD-10-CM | POA: Diagnosis not present

## 2022-06-11 LAB — CBC WITH DIFFERENTIAL/PLATELET

## 2022-06-11 NOTE — Progress Notes (Signed)
Patient ID: [name] [name], male   DOB: May 06, 1933, 89 y.o.   MRN: [id number]     89 y.o. history of CAD. Stent to LAD in 2008.  ETT in 2013 abnormal with NSVT. Cath 05/01/12 30% ISR LaD and distal RCA 60-75% with FFR CT .92 Rx medically Myovue done 04/27/17 reviewed and normal with no ischemia study not gated due to PAC;s EF normal echo 06/25/15 55-60% LA only mildly dilated Had another myovue 04/22/20 which was normal with no ischemia EF estimtated 53%  Has Occular migraines Been going on for over 3-4 years  Seen by neurology And MRI/MRA no abnormality and EEG no epileptic foci  01/11/14  1-39% bilateral carotid disease   Has lower lumbar disc issue and bilateral hip pain Has had repeat injection by [name] [name] [name] Also with right [facility] joint arthritis most recent injection 09/06/21 Has had Platelet Rich Plasma injections for his hips as well   03/2020 Went to [place] to visit his cousin [name] [name] who is a famous Engineer, agricultural in [place] has a hypochondriac for husband They moved onto my street at [facility] last year  Daughter in [place] not working as [name] [name] but he likes her husband better Son works with him and got married for first time at age 61 to pharmacist at [employer]  Had a syncopal episode July  4th. 2022 Started with visual changes then BP low  80's with pre syncope  Felt weak for a couple hours. Had two drinks prior Not very hot out Occurred around 6 o'clock No  Chest pain, dyspnea Went to sleep and felt fine with no recurrence   Monitor: 12/29/20   showed no PAF but 8% burden atrial ectopy with self limited runs of SVT   He is building a [employer] on [address] Then will do one with a [employer] on [address]   Last 3 weeks has had ? Angina. Tight pain in shoulders and back of neck with exertion that dissipates with rest Reproducible with similar degrees of stress No rest pain Also has had lower BP and we told him to hold his Zestril   Discussed prudence of heart cath given known distant  history of CAD/Stent and moderate residual Dx over 10 years ago  Shared Decision Making/Informed Consent The risks [stroke (1 in 1000), death (1 in 1000), kidney failure [usually temporary] (1 in 500), bleeding (1 in 200), allergic reaction [possibly serious] (1 in 200)], benefits (diagnostic support and management of coronary artery disease) and alternatives of a cardiac catheterization were discussed in detail with [name] [name] and she is willing to proceed.    ROS: Denies fever, malais, weight loss, blurry vision, decreased visual acuity, cough, sputum, SOB, hemoptysis, pleuritic pain, palpitaitons, heartburn, abdominal pain, melena, lower extremity edema, claudication, or rash.  All other systems reviewed and negative  General: BP (!) 98/44   Pulse 70   Ht '5\' 10"'$  (1.778 m)   Wt 143 lb 12.8 oz (65.2 kg)   SpO2 94%   BMI 20.63 kg/m  Affect appropriate Healthy:  appears stated age 34: normal Neck supple with no adenopathy JVP normal no bruits no thyromegaly Lungs clear with no wheezing and good diaphragmatic motion Heart:  S1/S2 no murmur, no rub, gallop or click PMI normal Abdomen: benighn, BS positve, no tenderness, no AAA no bruit.  No HSM or HJR Distal pulses intact with no bruits No edema Neuro non-focal Skin warm and dry No muscular weakness     Current  Outpatient Medications  Medication Sig Dispense Refill   ALPRAZolam (XANAX) 0.25 MG tablet Take 1 tablet (0.25 mg total) by mouth 2 (two) times daily as needed for anxiety. 60 tablet 3   dutasteride (AVODART) 0.5 MG capsule Take 0.5 mg by mouth daily.     hydrocortisone 2.5 % cream Apply 1 application topically 2 (two) times daily.     lisinopril (ZESTRIL) 10 MG tablet Take 1 tablet (10 mg total) by mouth daily. 10 tablet 0   metoprolol succinate (TOPROL-XL) 50 MG 24 hr tablet Take 1 tablet (50 mg total) by mouth daily. 90 tablet 3   Polyethylene Glycol 3350 (MIRALAX PO) Take by mouth.     psyllium (METAMUCIL) 58.6 %  packet Take 1 packet by mouth daily.     rosuvastatin (CRESTOR) 40 MG tablet Take 1 tablet (40 mg total) by mouth daily. 90 tablet 2   tamsulosin (FLOMAX) 0.4 MG CAPS capsule Take 0.4 mg by mouth daily.     zolpidem (AMBIEN) 10 MG tablet TAKE 1 TABLET(10 MG) BY MOUTH AT BEDTIME AS NEEDED FOR SLEEP 90 tablet 0   traZODone (DESYREL) 50 MG tablet TAKE 1/2 TO 1 TABLET BY MOUTH DAILY AT BEDTIME (Patient not taking: Reported on 06/11/2022) 30 tablet 1   No current facility-administered medications for this visit.    Allergies  Patient has no known allergies.  Electrocardiogram:  06/11/2022  SR rate 70 RBBB  no changes   Assessment and Plan  CAD: ? New onset angina. Cath arranged for Tuesday with [name] today. Orders written   Continue medical Rx Stent LAD 2008  Moderate distal RCA with normal flow wire 04/2012  Normal non ischemic myovue 04/22/20 continue medical Rx Ne  HLD : on generic crestor now  Lab Results  Component Value Date   LDLCALC 46 10/20/2020    GERD:with constipation not helped with Miralax, ducolox or Linzess f/u [name] [name]:  Chronic on ambien f/u [name] [name]   Visual Disturbance:  MRI/MRA normal EEG normal has been seen by neurology ? Occular migraines   Ortho:  f/u [name] [name] [name] for bilateral hip pain post injections MRI L3,4 disc bulge as well as right AC joint arthritis   Syncope:   Normal myovue 04/22/20 Labs ok Monitor with mostly atrial ectopy continue beta blocker BP lower now again ? Related to CAD Weight stable not postural D/c Zestril   Cath Pre cath labs D/c Zestril    F/U post cath    [facility]

## 2022-06-11 NOTE — Patient Instructions (Addendum)
Medication Instructions:  Your physician recommends that you continue on your current medications as directed. Please refer to the Current Medication list given to you today.  *If you need a refill on your cardiac medications before your next appointment, please call your pharmacy*  Lab Work: Your physician recommends that you have lab work today- BMET and CBC  If you have labs (blood work) drawn today and your tests are completely normal, you will receive your results only by: MyChart Message (if you have MyChart) OR A paper copy in the mail If you have any lab test that is abnormal or we need to change your treatment, we will call you to review the results.  Testing/Procedures: Your physician has requested that you have a cardiac catheterization. Cardiac catheterization is used to diagnose and/or treat various heart conditions. Doctors may recommend this procedure for a number of different reasons. The most common reason is to evaluate chest pain. Chest pain can be a symptom of coronary artery disease (CAD), and cardiac catheterization can show whether plaque is narrowing or blocking your heart's arteries. This procedure is also used to evaluate the valves, as well as measure the blood flow and oxygen levels in different parts of your heart. For further information please visit [email or web address]. Please follow instruction sheet, as given.  Follow-Up: At [facility], you and your health needs are our priority.  As part of our continuing mission to provide you with exceptional heart care, we have created designated Provider Care Teams.  These Care Teams include your primary Cardiologist (physician) and Advanced Practice Providers (APPs -  Physician Assistants and Nurse Practitioners) who all work together to provide you with the care you need, when you need it.  We recommend signing up for the patient portal called "MyChart".  Sign up information is provided on this After Visit  Summary.  MyChart is used to connect with patients for Virtual Visits (Telemedicine).  Patients are able to view lab/test results, encounter notes, upcoming appointments, etc.  Non-urgent messages can be sent to your provider as well.   To learn more about what you can do with MyChart, go to [email or web address].    Your next appointment:   3 week(s)  Provider:   [name], MD      [facility], [place] [id number] [place] [address] Dept: [phone] Loc: [place]  06/11/2022  You are scheduled for a Cardiac Catheterization on Tuesday, January 30 with Dr. Larae Grooms.  1. Please arrive at the [facility] (Main Entrance A) at [facility]: [address] [place], [place] [address] at 5:30 AM (This time is two hours before your procedure to ensure your preparation). Free valet parking service is available.   Special note: Every effort is made to have your procedure done on time. Please understand that emergencies sometimes delay scheduled procedures.  2. Diet: Do not eat solid foods after midnight.  The patient may have clear liquids until 5am upon the day of the procedure.  3. Labs: You will need to have blood drawn on Friday, January 26 at [facility] at [facility]. [address]  Open: 7:30am - 5pm    Phone: [phone]. You do not need to be fasting.  4. Medication instructions in preparation for your procedure:   Contrast Allergy: No  On the morning of your  procedure, take your Aspirin 81 mg and any morning medicines NOT listed above.  You may use sips of water.  5. Plan for one night stay--bring personal belongings. 6. Bring a current list of your medications and current insurance cards. 7. You MUST have a responsible person to drive you home. 8. Someone MUST be with you the first  24 hours after you arrive home or your discharge will be delayed. 9. Please wear clothes that are easy to get on and off and wear slip-on shoes.  Thank you for allowing [place] to care for you!   -- [place] [facility]

## 2022-06-11 NOTE — H&P (View-Only) (Signed)
Patient ID: [name] [name], male   DOB: 1933/01/23, 89 y.o.   MRN: [id number]     89 y.o. history of CAD. Stent to LAD in 2008.  ETT in 2013 abnormal with NSVT. Cath 05/01/12 30% ISR LaD and distal RCA 60-75% with FFR CT .92 Rx medically Myovue done 04/27/17 reviewed and normal with no ischemia study not gated due to PAC;s EF normal echo 06/25/15 55-60% LA only mildly dilated Had another myovue 04/22/20 which was normal with no ischemia EF estimtated 53%  Has Occular migraines Been going on for over 3-4 years  Seen by neurology And MRI/MRA no abnormality and EEG no epileptic foci  01/11/14  1-39% bilateral carotid disease   Has lower lumbar disc issue and bilateral hip pain Has had repeat injection by Dr Tamala Julian Also with right [facility] joint arthritis most recent injection 09/06/21 Has had Platelet Rich Plasma injections for his hips as well   03/2020 Went to [place] to visit his cousin [name] who is a famous Engineer, agricultural in [place] has a hypochondriac for husband They moved onto my street at [facility] last year  Daughter in [place] not working as [employer] but he likes her husband better Son works with him and got married for first time at age 44 to pharmacist at [employer]  Had a syncopal episode July  4th. 2022 Started with visual changes then BP low  80's with pre syncope  Felt weak for a couple hours. Had two drinks prior Not very hot out Occurred around 6 o'clock No  Chest pain, dyspnea Went to sleep and felt fine with no recurrence   Monitor: 12/29/20   showed no PAF but 8% burden atrial ectopy with self limited runs of SVT   He is building a [employer] on [address] Then will do one with a [employer] on [address]   Last 3 weeks has had ? Angina. Tight pain in shoulders and back of neck with exertion that dissipates with rest Reproducible with similar degrees of stress No rest pain Also has had lower BP and we told him to hold his Zestril   Discussed prudence of heart cath given known distant  history of CAD/Stent and moderate residual Dx over 10 years ago  Shared Decision Making/Informed Consent The risks [stroke (1 in 1000), death (1 in 1000), kidney failure [usually temporary] (1 in 500), bleeding (1 in 200), allergic reaction [possibly serious] (1 in 200)], benefits (diagnostic support and management of coronary artery disease) and alternatives of a cardiac catheterization were discussed in detail with [name] and she is willing to proceed.    ROS: Denies fever, malais, weight loss, blurry vision, decreased visual acuity, cough, sputum, SOB, hemoptysis, pleuritic pain, palpitaitons, heartburn, abdominal pain, melena, lower extremity edema, claudication, or rash.  All other systems reviewed and negative  General: BP (!) 98/44   Pulse 70   Ht '5\' 10"'$  (1.778 m)   Wt 143 lb 12.8 oz (65.2 kg)   SpO2 94%   BMI 20.63 kg/m  Affect appropriate Healthy:  appears stated age 27: normal Neck supple with no adenopathy JVP normal no bruits no thyromegaly Lungs clear with no wheezing and good diaphragmatic motion Heart:  S1/S2 no murmur, no rub, gallop or click PMI normal Abdomen: benighn, BS positve, no tenderness, no AAA no bruit.  No HSM or HJR Distal pulses intact with no bruits No edema Neuro non-focal Skin warm and dry No muscular weakness     Current  Outpatient Medications  Medication Sig Dispense Refill   ALPRAZolam (XANAX) 0.25 MG tablet Take 1 tablet (0.25 mg total) by mouth 2 (two) times daily as needed for anxiety. 60 tablet 3   dutasteride (AVODART) 0.5 MG capsule Take 0.5 mg by mouth daily.     hydrocortisone 2.5 % cream Apply 1 application topically 2 (two) times daily.     lisinopril (ZESTRIL) 10 MG tablet Take 1 tablet (10 mg total) by mouth daily. 10 tablet 0   metoprolol succinate (TOPROL-XL) 50 MG 24 hr tablet Take 1 tablet (50 mg total) by mouth daily. 90 tablet 3   Polyethylene Glycol 3350 (MIRALAX PO) Take by mouth.     psyllium (METAMUCIL) 58.6 %  packet Take 1 packet by mouth daily.     rosuvastatin (CRESTOR) 40 MG tablet Take 1 tablet (40 mg total) by mouth daily. 90 tablet 2   tamsulosin (FLOMAX) 0.4 MG CAPS capsule Take 0.4 mg by mouth daily.     zolpidem (AMBIEN) 10 MG tablet TAKE 1 TABLET(10 MG) BY MOUTH AT BEDTIME AS NEEDED FOR SLEEP 90 tablet 0   traZODone (DESYREL) 50 MG tablet TAKE 1/2 TO 1 TABLET BY MOUTH DAILY AT BEDTIME (Patient not taking: Reported on 06/11/2022) 30 tablet 1   No current facility-administered medications for this visit.    Allergies  Patient has no known allergies.  Electrocardiogram:  06/11/2022  SR rate 70 RBBB  no changes   Assessment and Plan  CAD: ? New onset angina. Cath arranged for Tuesday with Dr Irish [facility] today. Orders written   Continue medical Rx Stent LAD 2008  Moderate distal RCA with normal flow wire 04/2012  Normal non ischemic myovue 04/22/20 continue medical Rx Ne  HLD : on generic crestor now  Lab Results  Component Value Date   LDLCALC 46 10/20/2020    GERD:with constipation not helped with Miralax, ducolox or Linzess f/u Dr Satira Anis:  Chronic on ambien f/u Dr Ronnald Ramp   Visual Disturbance:  MRI/MRA normal EEG normal has been seen by neurology ? Occular migraines   Ortho:  f/u Dr Tamala Julian for bilateral hip pain post injections MRI L3,4 disc bulge as well as right AC joint arthritis   Syncope:   Normal myovue 04/22/20 Labs ok Monitor with mostly atrial ectopy continue beta blocker BP lower now again ? Related to CAD Weight stable not postural D/c Zestril   Cath Pre cath labs D/c Zestril    F/U post cath    [facility]

## 2022-06-14 ENCOUNTER — Other Ambulatory Visit: Payer: Self-pay | Admitting: Cardiovascular Disease

## 2022-06-14 ENCOUNTER — Telehealth: Payer: Self-pay | Admitting: *Deleted

## 2022-06-14 LAB — BASIC METABOLIC PANEL
BUN/Creatinine Ratio: 16 (ref 10–24)
BUN: 17 mg/dL (ref 8–27)
CO2: 21 mmol/L (ref 20–29)
Calcium: 8.8 mg/dL (ref 8.6–10.2)
Chloride: 106 mmol/L (ref 96–106)
Creatinine, Ser: 1.09 mg/dL (ref 0.76–1.27)
Glucose: 96 mg/dL (ref 70–99)
Potassium: 4.1 mmol/L (ref 3.5–5.2)
Sodium: 141 mmol/L (ref 134–144)
eGFR: 66 mL/min/{1.73_m2} (ref >59)

## 2022-06-14 LAB — CBC WITH DIFFERENTIAL/PLATELET
Basophils Absolute: 0 10*3/uL (ref 0.0–0.2)
Basos: 1 %
EOS (ABSOLUTE): 0.1 10*3/uL (ref 0.0–0.4)
Eos: 3 %
Hematocrit: 40.5 % (ref 37.5–51.0)
Hemoglobin: 13.2 g/dL (ref 13.0–17.7)
Immature Grans (Abs): 0 10*3/uL (ref 0.0–0.1)
Immature Granulocytes: 0 %
Lymphocytes Absolute: 1.2 10*3/uL (ref 0.7–3.1)
Lymphs: 27 %
MCH: 29.7 pg (ref 26.6–33.0)
MCHC: 32.6 g/dL (ref 31.5–35.7)
MCV: 91 fL (ref 79–97)
Monocytes Absolute: 0.4 10*3/uL (ref 0.1–0.9)
Monocytes: 10 %
Neutrophils Absolute: 2.6 10*3/uL (ref 1.4–7.0)
Neutrophils: 59 %
Platelets: 182 10*3/uL (ref 150–450)
RBC: 4.45 x10E6/uL (ref 4.14–5.80)
RDW: 12.9 % (ref 11.6–15.4)
WBC: 4.4 10*3/uL (ref 3.4–10.8)

## 2022-06-14 NOTE — Telephone Encounter (Signed)
Cardiac Catheterization scheduled at [facility] for: Tuesday June 15, 2022 7:30 AM Arrival time and place: [facility] En[facility] A at: 5:30 AM  Nothing to eat after midnight prior to procedure, clear liquids until 5 AM day of procedure.  Medication instructions: -Usual morning medications can be taken with sips of water including aspirin 81 mg.  Confirmed patient has responsible adult to drive home post procedure and be with patient first 24 hours after arriving home.  Patient reports no new symptoms concerning for COVID-19 in the past 10 days.  Reviewed procedure instructions with patient.

## 2022-06-15 ENCOUNTER — Encounter (HOSPITAL_COMMUNITY)
Admission: RE | Disposition: A | Discharge status: Home / Self Care | Payer: Self-pay | Source: Home / Self Care | Attending: Interventional Cardiology

## 2022-06-15 ENCOUNTER — Ambulatory Visit (HOSPITAL_COMMUNITY)
Admission: RE | Admit: 2022-06-15 | Discharge: 2022-06-15 | Disposition: A | Discharge status: Home / Self Care | Payer: Medicare Other | Attending: Interventional Cardiology | Admitting: Interventional Cardiology

## 2022-06-15 ENCOUNTER — Other Ambulatory Visit: Payer: Self-pay

## 2022-06-15 ENCOUNTER — Other Ambulatory Visit (HOSPITAL_COMMUNITY): Payer: Self-pay

## 2022-06-15 DIAGNOSIS — Z955 Presence of coronary angioplasty implant and graft: Secondary | ICD-10-CM | POA: Diagnosis not present

## 2022-06-15 DIAGNOSIS — I2510 Atherosclerotic heart disease of native coronary artery without angina pectoris: Secondary | ICD-10-CM | POA: Diagnosis present

## 2022-06-15 DIAGNOSIS — I25118 Atherosclerotic heart disease of native coronary artery with other forms of angina pectoris: Secondary | ICD-10-CM | POA: Diagnosis not present

## 2022-06-15 HISTORY — PX: LEFT HEART CATH AND CORONARY ANGIOGRAPHY: CATH118249

## 2022-06-15 HISTORY — PX: CORONARY STENT INTERVENTION: CATH118234

## 2022-06-15 LAB — POCT ACTIVATED CLOTTING TIME
Activated Clotting Time: 260 seconds
Activated Clotting Time: 277 seconds

## 2022-06-15 SURGERY — LEFT HEART CATH AND CORONARY ANGIOGRAPHY
Anesthesia: LOCAL

## 2022-06-15 MED ORDER — ASPIRIN 81 MG PO TBEC
81.0000 mg | DELAYED_RELEASE_TABLET | Freq: Every day | ORAL | 5 refills | Status: DC
  Filled 2022-06-15 – 2022-09-13 (×2): qty 30, 30d supply, fill #0

## 2022-06-15 MED ORDER — SODIUM CHLORIDE 0.9 % IV SOLN: 250.0000 mL | INTRAVENOUS | Status: DC | PRN

## 2022-06-15 MED ORDER — ACETAMINOPHEN 325 MG PO TABS: 650.0000 mg | ORAL_TABLET | ORAL | Status: DC | PRN

## 2022-06-15 MED ORDER — LIDOCAINE HCL (PF) 1 % IJ SOLN
INTRAMUSCULAR | Status: DC | PRN
  Administered 2022-06-15: 2 mL via INTRADERMAL

## 2022-06-15 MED ORDER — HEPARIN (PORCINE) IN NACL 1000-0.9 UT/500ML-% IV SOLN
INTRAVENOUS | Status: DC | PRN
  Administered 2022-06-15 (×2): 500 mL

## 2022-06-15 MED ORDER — HEPARIN SODIUM (PORCINE) 1000 UNIT/ML IJ SOLN
INTRAMUSCULAR | Status: AC
  Filled 2022-06-15: qty 10

## 2022-06-15 MED ORDER — SODIUM CHLORIDE 0.9 % WEIGHT BASED INFUSION
3.0000 mL/kg/h | INTRAVENOUS | Status: AC
  Administered 2022-06-15: 3 mL/kg/h via INTRAVENOUS

## 2022-06-15 MED ORDER — SODIUM CHLORIDE 0.9% FLUSH: 3.0000 mL | INTRAVENOUS | Status: DC | PRN

## 2022-06-15 MED ORDER — LIDOCAINE HCL (PF) 1 % IJ SOLN
INTRAMUSCULAR | Status: AC
  Filled 2022-06-15: qty 30

## 2022-06-15 MED ORDER — CLOPIDOGREL BISULFATE 300 MG PO TABS
ORAL_TABLET | ORAL | Status: DC | PRN
  Administered 2022-06-15: 600 mg via ORAL

## 2022-06-15 MED ORDER — SODIUM CHLORIDE 0.9 % IV SOLN: INTRAVENOUS | Status: AC

## 2022-06-15 MED ORDER — MIDAZOLAM HCL 2 MG/2ML IJ SOLN
INTRAMUSCULAR | Status: AC
  Filled 2022-06-15: qty 2

## 2022-06-15 MED ORDER — SODIUM CHLORIDE 0.9% FLUSH: 3.0000 mL | Freq: Two times a day (BID) | INTRAVENOUS | Status: DC

## 2022-06-15 MED ORDER — NITROGLYCERIN 0.4 MG SL SUBL
0.4000 mg | SUBLINGUAL_TABLET | SUBLINGUAL | 3 refills | Status: DC | PRN
  Filled 2022-06-15: qty 25, 5d supply, fill #0

## 2022-06-15 MED ORDER — FAMOTIDINE IN NACL 20-0.9 MG/50ML-% IV SOLN
INTRAVENOUS | Status: AC
  Filled 2022-06-15: qty 50

## 2022-06-15 MED ORDER — MIDAZOLAM HCL 2 MG/2ML IJ SOLN
INTRAMUSCULAR | Status: DC | PRN
  Administered 2022-06-15: .5 mg via INTRAVENOUS

## 2022-06-15 MED ORDER — HYDRALAZINE HCL 20 MG/ML IJ SOLN: 10.0000 mg | INTRAMUSCULAR | Status: AC | PRN

## 2022-06-15 MED ORDER — VERAPAMIL HCL 2.5 MG/ML IV SOLN
INTRAVENOUS | Status: DC | PRN
  Administered 2022-06-15: 10 mL via INTRA_ARTERIAL

## 2022-06-15 MED ORDER — FENTANYL CITRATE (PF) 100 MCG/2ML IJ SOLN
INTRAMUSCULAR | Status: AC
  Filled 2022-06-15: qty 2

## 2022-06-15 MED ORDER — CLOPIDOGREL BISULFATE 75 MG PO TABS: 75.0000 mg | ORAL_TABLET | Freq: Every day | ORAL | Status: DC

## 2022-06-15 MED ORDER — NITROGLYCERIN 1 MG/10 ML FOR IR/CATH LAB
INTRA_ARTERIAL | Status: DC | PRN
  Administered 2022-06-15: 400 ug via INTRACORONARY
  Administered 2022-06-15: 200 ug via INTRACORONARY

## 2022-06-15 MED ORDER — ASPIRIN 81 MG PO CHEW: 81.0000 mg | CHEWABLE_TABLET | ORAL | Status: AC

## 2022-06-15 MED ORDER — ONDANSETRON HCL 4 MG/2ML IJ SOLN: 4.0000 mg | Freq: Four times a day (QID) | INTRAMUSCULAR | Status: DC | PRN

## 2022-06-15 MED ORDER — HEPARIN SODIUM (PORCINE) 1000 UNIT/ML IJ SOLN
INTRAMUSCULAR | Status: DC | PRN
  Administered 2022-06-15 (×2): 5000 [IU] via INTRAVENOUS
  Administered 2022-06-15 (×2): 3000 [IU] via INTRAVENOUS
  Administered 2022-06-15: 2000 [IU] via INTRAVENOUS

## 2022-06-15 MED ORDER — NITROGLYCERIN 1 MG/10 ML FOR IR/CATH LAB
INTRA_ARTERIAL | Status: AC
  Filled 2022-06-15: qty 10

## 2022-06-15 MED ORDER — SODIUM CHLORIDE 0.9 % WEIGHT BASED INFUSION: 1.0000 mL/kg/h | INTRAVENOUS | Status: DC

## 2022-06-15 MED ORDER — FAMOTIDINE IN NACL 20-0.9 MG/50ML-% IV SOLN
INTRAVENOUS | Status: DC | PRN
  Administered 2022-06-15: 20 mg via INTRAVENOUS

## 2022-06-15 MED ORDER — CLOPIDOGREL BISULFATE 75 MG PO TABS
75.0000 mg | ORAL_TABLET | Freq: Every day | ORAL | 5 refills | Status: DC
  Filled 2022-06-15: qty 30, 30d supply, fill #0

## 2022-06-15 MED ORDER — LABETALOL HCL 5 MG/ML IV SOLN: 10.0000 mg | INTRAVENOUS | Status: AC | PRN

## 2022-06-15 MED ORDER — SODIUM CHLORIDE 0.9 % IV SOLN
INTRAVENOUS | Status: DC | PRN
  Administered 2022-06-15: 250 mL via INTRAVENOUS

## 2022-06-15 MED ORDER — HEPARIN (PORCINE) IN NACL 1000-0.9 UT/500ML-% IV SOLN
INTRAVENOUS | Status: AC
  Filled 2022-06-15: qty 1000

## 2022-06-15 MED ORDER — VERAPAMIL HCL 2.5 MG/ML IV SOLN
INTRAVENOUS | Status: AC
  Filled 2022-06-15: qty 2

## 2022-06-15 MED ORDER — FENTANYL CITRATE (PF) 100 MCG/2ML IJ SOLN
INTRAMUSCULAR | Status: DC | PRN
  Administered 2022-06-15: 12.5 ug via INTRAVENOUS

## 2022-06-15 MED ORDER — ASPIRIN 81 MG PO CHEW: 81.0000 mg | CHEWABLE_TABLET | Freq: Every day | ORAL | Status: DC

## 2022-06-15 SURGICAL SUPPLY — 20 items
BALLN EMERGE MR 2.5X20 (BALLOONS) ×1
BALLN ~~LOC~~ EMERGE MR 3.25X12 (BALLOONS) ×1
BALLOON EMERGE MR 2.5X20 (BALLOONS) IMPLANT
BALLOON ~~LOC~~ EMERGE MR 3.25X12 (BALLOONS) IMPLANT
CATH 5FR JL3.5 JR4 ANG PIG MP (CATHETERS) IMPLANT
CATH LAUNCHER 6FR JR4 (CATHETERS) IMPLANT
DEVICE RAD COMP TR BAND LRG (VASCULAR PRODUCTS) IMPLANT
GLIDESHEATH SLEND SS 6F .021 (SHEATH) IMPLANT
GUIDEWIRE INQWIRE 1.5J.035X260 (WIRE) IMPLANT
INQWIRE 1.5J .035X260CM (WIRE) ×1
KIT HEART LEFT (KITS) ×1 IMPLANT
PACK CARDIAC CATHETERIZATION (CUSTOM PROCEDURE TRAY) ×1 IMPLANT
PROTECTION STATION PRESSURIZED (MISCELLANEOUS) ×1
STATION PROTECTION PRESSURIZED (MISCELLANEOUS) IMPLANT
STENT SYNERGY XD 2.75X24 (Permanent Stent) IMPLANT
SYNERGY XD 2.75X24 (Permanent Stent) ×1 IMPLANT
TRANSDUCER W/STOPCOCK (MISCELLANEOUS) ×1 IMPLANT
TUBING CIL FLEX 10 FLL-RA (TUBING) ×1 IMPLANT
VALVE GUARDIAN II ~~LOC~~ HEMO (MISCELLANEOUS) IMPLANT
WIRE RUNTHROUGH .014X180CM (WIRE) IMPLANT

## 2022-06-15 NOTE — Progress Notes (Signed)
Pt was educated on stent card, stent location, Plavix and ASA use, wt restrictions, no baths/daily wash-ups, s/s of infection, ex guidelines (progressive walking and resistance exercise), s/s to stop exercising, NTG use and calling 911, heart healthy diet, and CRPII. Pt received materials on exercise, diet, and CRPII. Will refer to [facility].    Christen Bame 06/15/2022 11:50 AM   [phone]

## 2022-06-15 NOTE — Interval H&P Note (Signed)
Cath Lab Visit (complete for each Cath Lab visit)  Clinical Evaluation Leading to the Procedure:   ACS: No.  Non-ACS:    Anginal Classification: CCS III  Anti-ischemic medical therapy: Minimal Therapy (1 class of medications)  Non-Invasive Test Results: No non-invasive testing performed  Prior CABG: No previous CABG      History and Physical Interval Note:  06/15/2022 7:34 AM  [name] [name]  has presented today for surgery, with the diagnosis of chest pain, history of CAD.  The various methods of treatment have been discussed with the patient and family. After consideration of risks, benefits and other options for treatment, the patient has consented to  Procedure(s): LEFT HEART CATH AND CORONARY ANGIOGRAPHY (N/A) as a surgical intervention.  The patient's history has been reviewed, patient examined, no change in status, stable for surgery.  I have reviewed the patient's chart and labs.  Questions were answered to the patient's satisfaction.     Larae Grooms

## 2022-06-15 NOTE — Discharge Summary (Addendum)
Discharge Summary for Same Day PCI   Patient ID: [name] [name] MRN: [id number]; DOB: 05/30/1932  Admit date: 06/15/2022 Discharge date: 06/15/2022  Primary Care Provider: [name], MD  Primary Cardiologist: [name], MD  Primary Electrophysiologist:  None   Discharge Diagnoses    Active Problems:   Coronary artery disease   Diagnostic Studies/Procedures    Cardiac Catheterization 06/15/2022:    Dist LAD lesion is 40% stenosed.  Eccentric lesion occurs at a bifurcation with the last significant diagonal branch.   Prox LAD lesion is 10% stenosed.   RPAV lesion is 75% stenosed.   A drug-eluting stent was successfully placed using a SYNERGY XD 2.75X24 mm, postdilated to greater than 3.25 mm.   Post intervention, there is a 0% residual stenosis.   The left ventricular systolic function is normal.   LV end diastolic pressure is normal.   The left ventricular ejection fraction is 55-65% by visual estimate.   There is no aortic valve stenosis.   Successful PCI of the posterolateral artery.  Initially, the stenosis looked longer but there was partial resolution with intracoronary nitroglycerin.  We were able to use a shorter stent than initially planned.  Of note, he also had some vasospasm in the right radial artery.  If he had further anginal symptoms, could consider isosorbide.   Plan for same-day discharge.  Results conveyed to [name], [id number].  Diagnostic Dominance: Right  Intervention   _____________   History of Present Illness     [name] [name] is a 89 y.o. male with PMH of CAD prior LAD stent '08, migraines, HTN, HLD who is followed by Dr. Johnsie Cancel as an outpatient. Last seen in the office on 1/26 with complaints of angina for the 3 weeks prior. Given his symptoms and prior hx of CAD it was recommended for outpatient cardiac cath.   Hospital Course     The patient underwent cardiac cath as noted above with successful PCI/DES x1 of the  posterolateral artery. Plan for DAPT with ASA/plavix for at least 6 months. The patient was seen by cardiac rehab while in short stay. There were no observed complications post cath. Radial cath site was re-evaluated prior to discharge and found to be stable without any complications. Instructions/precautions regarding cath site care were given prior to discharge.  [name] was seen by Dr. Irish Lack and determined stable for discharge home. Follow up with our office has been arranged. Medications are listed below. Pertinent changes include addition of ASA, plavix and nitro.    _____________  Cath/PCI Registry Performance & Quality Measures: Aspirin prescribed? - Yes ADP Receptor Inhibitor (Plavix/Clopidogrel, Brilinta/Ticagrelor or Effient/Prasugrel) prescribed (includes medically managed patients)? - Yes High Intensity Statin (Lipitor 40-'80mg'$  or Crestor 20-'40mg'$ ) prescribed? - Yes For EF <40%, was ACEI/ARB prescribed? - Not Applicable (EF >/= 82%) For EF <40%, Aldosterone Antagonist (Spironolactone or Eplerenone) prescribed? - Not Applicable (EF >/= 95%) Cardiac Rehab Phase II ordered (Included Medically managed Patients)? - Yes  _____________   Discharge Vitals Blood pressure 135/62, pulse (!) 57, temperature (!) 97.2 F (36.2 C), temperature source Temporal, resp. rate (!) 23, height '5\' 10"'$  (1.778 m), weight 63 kg, SpO2 97 %.  Filed Weights   06/15/22 0542  Weight: 63 kg    Last Labs & Radiologic Studies    CBC No results for input(s): "WBC", "NEUTROABS", "HGB", "HCT", "MCV", "PLT" in the last 72 hours. Basic Metabolic Panel No results for input(s): "NA", "K", "CL", "CO2", "GLUCOSE", "BUN", "  CREATININE", "CALCIUM", "MG", "PHOS" in the last 72 hours. Liver Function Tests No results for input(s): "AST", "ALT", "ALKPHOS", "BILITOT", "PROT", "ALBUMIN" in the last 72 hours. No results for input(s): "LIPASE", "AMYLASE" in the last 72 hours. High Sensitivity Troponin:   No results  for input(s): "TROPONINIHS" in the last 720 hours.  BNP Invalid input(s): "POCBNP" D-Dimer No results for input(s): "DDIMER" in the last 72 hours. Hemoglobin A1C No results for input(s): "HGBA1C" in the last 72 hours. Fasting Lipid Panel No results for input(s): "CHOL", "HDL", "LDLCALC", "TRIG", "CHOLHDL", "LDLDIRECT" in the last 72 hours. Thyroid Function Tests No results for input(s): "TSH", "T4TOTAL", "T3FREE", "THYROIDAB" in the last 72 hours.  Invalid input(s): "FREET3" _____________  CARDIAC CATHETERIZATION  Result Date: 06/15/2022   Dist LAD lesion is 40% stenosed.  Eccentric lesion occurs at a bifurcation with the last significant diagonal branch.   Prox LAD lesion is 10% stenosed.   RPAV lesion is 75% stenosed.   A drug-eluting stent was successfully placed using a SYNERGY XD 2.75X24 mm, postdilated to greater than 3.25 mm.   Post intervention, there is a 0% residual stenosis.   The left ventricular systolic function is normal.   LV end diastolic pressure is normal.   The left ventricular ejection fraction is 55-65% by visual estimate.   There is no aortic valve stenosis. Successful PCI of the posterolateral artery.  Initially, the stenosis looked longer but there was partial resolution with intracoronary nitroglycerin.  We were able to use a shorter stent than initially planned.  Of note, he also had some vasospasm in the right radial artery.  If he had further anginal symptoms, could consider isosorbide. Plan for same-day discharge.  Results conveyed to [name], [phone].    Disposition   Pt is being discharged home today in good condition.  Follow-up Plans & Appointments     Follow-up Information     [name], NP Follow up on 07/01/2022.   Specialty: Cardiology Why: at 10:55am for your follow up appt with Sharyn Lull, Dr. Mariana Arn' NP Contact information: [place] [address] [phone]                Discharge  Instructions     Amb Referral to Cardiac Rehabilitation   Complete by: As directed    Diagnosis: Coronary Stents   After initial evaluation and assessments completed: Virtual Based Care may be provided alone or in conjunction with Phase 2 Cardiac Rehab based on patient barriers.: Yes   Intensive Cardiac Rehabilitation (ICR) [place] location only OR Traditional Cardiac Rehabilitation (TCR) *If criteria for ICR are not met will enroll in TCR [facility] only): Yes        Discharge Medications   Allergies as of 06/15/2022   No Known Allergies      Medication List     TAKE these medications    ALPRAZolam 0.25 MG tablet Commonly known as: XANAX Take 1 tablet (0.25 mg total) by mouth 2 (two) times daily as needed for anxiety.   Aspirin Low Dose 81 MG tablet Generic drug: aspirin EC Take 1 tablet (81 mg total) by mouth daily. Swallow whole.   clopidogrel 75 MG tablet Commonly known as: Plavix Take 1 tablet (75 mg total) by mouth daily.   diazepam 5 MG tablet Commonly known as: VALIUM Take 5 mg by mouth at bedtime.   lisinopril 10 MG tablet Commonly known as: ZESTRIL Take 1 tablet (10 mg total) by mouth daily.  metoprolol succinate 50 MG 24 hr tablet Commonly known as: TOPROL-XL Take 1 tablet (50 mg total) by mouth daily.   nitroGLYCERIN 0.4 MG SL tablet Commonly known as: Nitrostat Place 1 tablet (0.4 mg total) under the tongue every 5 (five) minutes as needed for chest pain.   psyllium 58.6 % packet Commonly known as: METAMUCIL Take 1 packet by mouth daily.   rosuvastatin 40 MG tablet Commonly known as: CRESTOR Take 1 tablet (40 mg total) by mouth daily.   Systane Ultra 0.4-0.3 % Soln Generic drug: Polyethyl Glycol-Propyl Glycol Place 1 drop into both eyes 2 (two) times daily.   tamsulosin 0.4 MG Caps capsule Commonly known as: FLOMAX Take 0.8 mg by mouth every morning.   traZODone 50 MG tablet Commonly known as: DESYREL TAKE 1/2 TO 1 TABLET BY MOUTH DAILY AT  BEDTIME   Vitamin D3 50 MCG (2000 UT) capsule Take 2,000 Units by mouth daily.   zolpidem 10 MG tablet Commonly known as: AMBIEN TAKE 1 TABLET(10 MG) BY MOUTH AT BEDTIME AS NEEDED FOR SLEEP          Allergies No Known Allergies  Outstanding Labs/Studies   N/a   Duration of Discharge Encounter   Greater than 30 minutes including physician time.  Signed, Reino Bellis, NP 06/15/2022, 1:55 PM  I have examined the patient and reviewed assessment and plan and discussed with patient.  Agree with above as stated.    RIght radial site stable. Had small hematoma after procedure but this resolved.  OK to go home on DAPT.  Continue aggressive medical therapy and risk factor modification.   Larae Grooms

## 2022-06-15 NOTE — Progress Notes (Signed)
Cath lab called to assess wrist, Zach rt assessed, hematoma resolved

## 2022-06-16 ENCOUNTER — Encounter (HOSPITAL_COMMUNITY): Payer: Self-pay | Admitting: Interventional Cardiology

## 2022-06-17 ENCOUNTER — Encounter: Payer: Self-pay | Admitting: Cardiovascular Disease

## 2022-06-19 DIAGNOSIS — R1110 Vomiting, unspecified: Secondary | ICD-10-CM | POA: Diagnosis not present

## 2022-06-19 DIAGNOSIS — R112 Nausea with vomiting, unspecified: Secondary | ICD-10-CM | POA: Diagnosis not present

## 2022-06-19 DIAGNOSIS — R531 Weakness: Secondary | ICD-10-CM | POA: Diagnosis not present

## 2022-06-19 DIAGNOSIS — Z743 Need for continuous supervision: Secondary | ICD-10-CM | POA: Diagnosis not present

## 2022-06-19 DIAGNOSIS — R12 Heartburn: Secondary | ICD-10-CM | POA: Diagnosis not present

## 2022-06-19 DIAGNOSIS — R079 Chest pain, unspecified: Secondary | ICD-10-CM | POA: Diagnosis not present

## 2022-06-22 ENCOUNTER — Telehealth: Payer: Self-pay | Admitting: Cardiovascular Disease

## 2022-06-22 NOTE — Telephone Encounter (Signed)
Lenna Sciara, NP  Cv Div Preop Callback22 minutes ago (11:44 AM)    Please clarify procedure details.  If patient is to have dental extraction, please clarify amount of teeth to be pulled.  Or is this only a cleaning?  Thank you.

## 2022-06-22 NOTE — Telephone Encounter (Signed)
   Pre-operative Risk Assessment    Patient Name: SAVEON PLANT  DOB: 07/21/1932 MRN: [id number]     Request for Surgical Clearance    Procedure:  Dental Extraction - Amount of Teeth to be Pulled:  dental cleaning  Date of Surgery:  Clearance TBD                                 Surgeon:  Dr. Posey Pronto Surgeon's Group or Practice Name:  Dr. Posey Pronto  Phone number:  [phone] Fax number:  [phone]   Type of Clearance Requested:   - Medical  - Pharmacy:  need to see if anything should be held   Type of Anesthesia:  None    Additional requests/questions:    Signed, Selena Zobro   06/22/2022, 10:43 AM

## 2022-06-22 NOTE — Telephone Encounter (Signed)
   Patient Name: [name] [name]  DOB: 1933-01-10 MRN: [id number]  Primary Cardiologist: [name], MD     Patient Name: [name] [name]  DOB: Jul 07, 1932 MRN: [id number]  Primary Cardiologist: [name], MD  Chart reviewed as part of pre-operative protocol coverage.   Simple dental extractions (i.e. 1-2 teeth), cleanings are considered low risk procedures per guidelines and generally do not require any specific cardiac clearance. It is also generally accepted that for simple extractions and dental cleanings, there is no need to interrupt blood thinner therapy.  However, patient is post PCI on 06/15/2022.  Therefore, patient should wait at least 6 weeks post PCI prior to proceeding with dental cleaning.  SBE prophylaxis is not required for the patient from a cardiac standpoint.  I will route this recommendation to the requesting party via Epic fax function and remove from pre-op pool.  Please call with questions.  Lenna Sciara, NP 06/22/2022, 4:18 PM

## 2022-06-22 NOTE — Telephone Encounter (Signed)
Spoke with the receptionist at Dr Mariel Sleet office and she states the patient is only having a cleaning right now.

## 2022-06-22 NOTE — Telephone Encounter (Signed)
Left message on dental offices voicemail requesting a callback or fax with missing information.

## 2022-06-30 ENCOUNTER — Telehealth (HOSPITAL_COMMUNITY): Payer: Self-pay

## 2022-06-30 NOTE — Telephone Encounter (Signed)
Pt insurance is active and benefits verified through Medicare A/B. Co-pay $0.00, DED $240.00/$240.00 met, out of pocket $0.00/$0.00 met, co-insurance 20%. No pre-authorization required. Passport, 06/30/2022 @ 10:54AM, [id number]   How many CR sessions are covered? (36 sessions for TCR, 72 sessions for ICR)72 Is this a lifetime maximum or an annual maximum? Lifetime Has the member used any of these services to date? No Is there a time limit (weeks/months) on start of program and/or program completion? No     Will contact patient to see if he is interested in the Cardiac Rehab Program. If interested, patient will need to complete follow up appt. Once completed, patient will be contacted for scheduling upon review by the RN Navigator.

## 2022-06-30 NOTE — Progress Notes (Unsigned)
Cardiology Office Note:    Date:  07/01/2022   ID:  [name], DOB 1932-11-15, MRN [id number]  PCP:  [name], MD   [facility] Providers Cardiologist:  [name], MD     Referring MD: [name], MD   Chief Complaint: follow-up recent PCI  History of Present Illness:    [name] [name] is a very  89 y.o. male with a hx of CAD with prior stent to LAD 2008, ocular migraines, HLD, GERD, syncope, and insomnia.  History of stent to LAD in 2008.  ETT 2013 abnormal with NSVT.  Cath 05/01/2012 revealed ISR LAD and distal RCA 60 to 75% with FFR CT 0.92, medical management recommended.  Myoview 04/27/2017 revealed no ischemia, not gated due to PACs.  Echo  2/2017with normal EF 55 to 60%, LA only mildly dilated.  Myoview 04/22/2020 which was normal, no ischemia, EF 53%.  Carotid ultrasound 12/2013 revealed 1 to 39% bilateral carotid artery disease.  Has been seen by neurology and had MRI/MRA with no abnormality for ocular migraines. EEG no epileptic foci.  Syncopal episode November 17, 2020.  Started with visual changes then BP low in the 80s with presyncope.  Felt weak for a couple of hours.  Had 2 drinks prior.  It was not very hot when he was out at around 6:00.  No chest pain, dyspnea, went to sleep and felt fine with no recurrence.  Cardiac monitor 12/29/2020 showed no PAF but 8% burden atrial ectopy with self-limited runs of SVT.  Last cardiology clinic visit was 06/11/2022 with Dr. Johnsie Cancel.  He had 3 weeks of tightness in his shoulders, back of his neck with exertion that dissipates with rest.  No pain at rest.  Holding Zestril for lower than normal BP.  Through shared decision making, patient was scheduled for left heart cath.  LHC 06/15/2022 revealed distal LAD lesion 40% stenosed, eccentric lesion occurs at the bifurcation with the last significant diagonal branch.  Proximal LAD lesion 10% stenosed, RPAV lesion 75% stenosed, successfully treated with DES.  Normal LVEDP, LV EF  55 to 65% by visual estimate, no aortic valve stenosis.  Initially stenosis looked longer but there was partial resolution with intracoronary nitroglycerin and shorter stent then initially plan was used.  He also had vasospasm in the right radial artery.  If anginal symptoms continue could consider adding isosorbide.  Today, he is here for follow-up. Reports he is feeling very well. Walks for exercise daily. Prior to stent he was having pain in shoulder and felt like he did not have as much stamina. 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 Was in [place] 4 days after the procedure and had terrible heart burn. Had eaten hushpuppies. Called EMS out of concern. Vomited total of 3 times, went to ED. Reports testing all checked out and everything checked out well. Went home that night and slept well and has had no reoccurrence of symptoms. He denies chest pain, shortness of breath, lower extremity edema, fatigue, palpitations, melena, hematuria, hemoptysis, diaphoresis, weakness, presyncope, syncope, orthopnea, and PND. Stopped aspirin because he was concerned about bruising but agrees to resume DAPT at least until his next office visit. He is very active and does not think he needs to participate in cardiac rehab.   Past Medical History:  Diagnosis Date   Anxiety    CAD (coronary artery disease)  a. s/p stent to LAD 2008;  b. abnormal ETT 11/13 with VTach => LHC pLAD 30% ISR, mCFX 30%, dRCA 60-75% which had progressed from previous study in 2010 => FFR of RCA 0.92 => med Rx    Carotid stenosis    dopplers 1/14:  0-39% bilateral ICA stenosis   Colon polyps    Colon polyps    adenomatous   Diverticular disease of colon    Hemorrhoids    external and internal   History of BPH    HTN (hypertension)    Hx of echocardiogram    a. Echo 9/11:  EF 55-60%, normal motion, grade 2 diastolic dysfunction, mild MR, mild LAE, mild  RAE, PASP 33.   Hyperlipidemia    IBS (irritable bowel syndrome)    Rhinitis    Stroke, lacunar ([place]) 08/24/2012   Old, 2011 head mri    Past Surgical History:  Procedure Laterality Date   APPENDECTOMY  1956   CORONARY STENT INTERVENTION N/A 06/15/2022   Procedure: CORONARY STENT INTERVENTION;  Surgeon: Jettie Booze, MD;  Location: [facility];  Service: Cardiovascular;  Laterality: N/A;   CORONARY STENT PLACEMENT  2008    Successful PCI of the lesion in the proximal LAD using a Promus    HERNIA REPAIR Right 2005   LEFT HEART CATH AND CORONARY ANGIOGRAPHY N/A 06/15/2022   Procedure: LEFT HEART CATH AND CORONARY ANGIOGRAPHY;  Surgeon: Jettie Booze, MD;  Location: [facility];  Service: Cardiovascular;  Laterality: N/A;   NASAL SINUS SURGERY  1975   PERCUTANEOUS CORONARY STENT INTERVENTION (PCI-S) N/A 05/03/2012   Procedure: PERCUTANEOUS CORONARY STENT INTERVENTION (PCI-S);  Surgeon: Sherren Mocha, MD;  Location: [facility];  Service: Cardiovascular;  Laterality: N/A;   Promus DES ok for 3T MRI  2008    Current Medications: Current Meds  Medication Sig   ALPRAZolam (XANAX) 0.25 MG tablet Take 1 tablet (0.25 mg total) by mouth 2 (two) times daily as needed for anxiety.   aspirin EC 81 MG tablet Take 1 tablet (81 mg total) by mouth daily. Swallow whole.   Cholecalciferol (VITAMIN D3) 50 MCG (2000 UT) capsule Take 2,000 Units by mouth daily.   diazepam (VALIUM) 5 MG tablet Take 5 mg by mouth at bedtime.   lisinopril (ZESTRIL) 10 MG tablet Take 1 tablet (10 mg total) by mouth daily.   metoprolol succinate (TOPROL-XL) 50 MG 24 hr tablet Take 1 tablet (50 mg total) by mouth daily.   nitroGLYCERIN (NITROSTAT) 0.4 MG SL tablet Place 1 tablet (0.4 mg total) under the tongue every 5 (five) minutes as needed for chest pain.   Polyethyl Glycol-Propyl Glycol (SYSTANE ULTRA) 0.4-0.3 % SOLN Place 1 drop into both eyes 2 (two) times daily.   psyllium (METAMUCIL) 58.6 %  packet Take 1 packet by mouth daily.   rosuvastatin (CRESTOR) 40 MG tablet Take 1 tablet (40 mg total) by mouth daily.   tamsulosin (FLOMAX) 0.4 MG CAPS capsule Take 0.8 mg by mouth every morning.   traZODone (DESYREL) 50 MG tablet TAKE 1/2 TO 1 TABLET BY MOUTH DAILY AT BEDTIME   zolpidem (AMBIEN) 10 MG tablet TAKE 1 TABLET(10 MG) BY MOUTH AT BEDTIME AS NEEDED FOR SLEEP   [DISCONTINUED] clopidogrel (PLAVIX) 75 MG tablet Take 1 tablet (75 mg total) by mouth daily.     Allergies:   Patient has no known allergies.   Social History   Socioeconomic History   Marital status: Married    Spouse  name: Not on file   Number of children: 3   Years of education: 48   Highest education level: Not on file  Occupational History   Occupation: businessman, 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RETIRED  Tobacco Use   Smoking status: Never    Passive exposure: Never   Smokeless tobacco: Never  Vaping Use   Vaping Use: Never used  Substance and Sexual Activity   Alcohol use: Not Currently    Comment: 4-6 drinks/week   Drug use: No   Sexual activity: Yes    Partners: Female  Other Topics Concern   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 He has two daughters, 1 in [place] 1 in [place] (Dec '12) and a son who works with him. Several grandchildren.Reports that he spends a good deal of time at his home in [facility] which he greatly enjoys and his home in the [place]. Enjoys driving his BMW [place] when [place].      ACP - Yes CPR, yes for short-term mechanical ventilation for reversible disease. Recommended [email or web address] for consideration.   Social Determinants of Health   Financial Resource Strain: Low Risk  (11/12/2021)   Overall Financial Resource Strain (CARDIA)    Difficulty of Paying Living Expenses: Not hard at all  Food  Insecurity: No Food Insecurity (11/12/2021)   Hunger Vital Sign    Worried About Running Out of Food in the Last Year: Never true    Ran Out of Food in the Last Year: Never true  Transportation Needs: No Transportation Needs (11/12/2021)   PRAPARE - Hydrologist (Medical): No    Lack of Transportation (Non-Medical): No  Physical Activity: Sufficiently Active (11/12/2021)   Exercise Vital Sign    Days of Exercise per Week: 5 days    Minutes of Exercise per Session: 30 min  Stress: No Stress Concern Present (11/12/2021)   [place]    Feeling of Stress : Not at all  Social Connections: [place] (11/12/2021)   Social Connection and Isolation Panel [NHANES]    Frequency of Communication with Friends and Family: More than three times a week    Frequency of Social Gatherings with Friends and Family: More than three times a week    Attends Religious Services: More than 4 times per year    Active Member of [employer] or Organizations: Yes    Attends Music therapist: More than 4 times per year    Marital Status: Married     Family History: The patient's family history includes Cancer in his brother; Coronary artery disease in his father; Heart attack in his father; Rheum arthritis in his mother. There is no history of Colon cancer, Stomach cancer, or Pancreatic cancer.  ROS:   Please see the history of present illness.   All other systems reviewed and are negative.  Labs/Other Studies Reviewed:    The following studies were reviewed today:  LHC 06/15/22   Dist LAD lesion is 40% stenosed.  Eccentric lesion occurs at a bifurcation with the last significant diagonal branch.   Prox LAD lesion is 10% stenosed.   RPAV lesion is 75% stenosed.   A drug-eluting stent was successfully placed using a SYNERGY XD 2.75X24 mm, postdilated to greater than 3.25 mm.   Post intervention,  there is  a 0% residual stenosis.   The left ventricular systolic function is normal.   LV end diastolic pressure is normal.   The left ventricular ejection fraction is 55-65% by visual estimate.   There is no aortic valve stenosis.   Successful PCI of the posterolateral artery.  Initially, the stenosis looked longer but there was partial resolution with intracoronary nitroglycerin.  We were able to use a shorter stent than initially planned.  Of note, he also had some vasospasm in the right radial artery.  If he had further anginal symptoms, could consider isosorbide.   Diagnostic Dominance: Right  Intervention     Cardiac monitor 12/29/20 Patient had a min HR of 45 bpm, max HR of 176 bpm, and avg HR of 71 bpm. Predominant underlying rhythm was Sinus Rhythm. Bundle Branch Block/IVCD was present. 159 Supraventricular Tachycardia runs occurred, the run with the fastest interval lasting 12  beats with a max rate of 176 bpm, the longest lasting 19 beats with an avg rate of 103 bpm. Isolated SVEs were frequent (8.3%, 55520), SVE Couplets were frequent (5.9%, 19785), and SVE Triplets were frequent (6.6%, 14798). Isolated VEs were occasional  (2.3%, 15136), VE Couplets were rare (<1.0%, 465), and VE Triplets were rare (<1.0%, 37). Ventricular Bigeminy and Trigeminy were present.   Summary:  SR average HR 71 bpm.  Frequent PAC's  8.3% total beats  Self limited runs of SVT Longest only 19 beats   Echo 06/25/15  Left ventricle: The cavity size was normal. Systolic function was    normal. The estimated ejection fraction was in the range of 55%    to 60%. Wall motion was normal; there were no regional wall    motion abnormalities.  - Left atrium: The atrium was mildly dilated.  - Atrial septum: No defect or patent foramen ovale was identified.    Recent Labs: 10/21/2021: TSH 2.88 06/11/2022: BUN 17; Creatinine, Ser 1.09; Hemoglobin 13.2; Platelets 182; Potassium 4.1; Sodium 141  Recent Lipid Panel     Component Value Date/Time   CHOL 140 10/20/2020 0837   TRIG 121.0 10/20/2020 0837   HDL 69.50 10/20/2020 0837   CHOLHDL 2 10/20/2020 0837   VLDL 24.2 10/20/2020 0837   LDLCALC 46 10/20/2020 0837   LDLDIRECT 132.2 03/10/2007 0721     Risk Assessment/Calculations:       Physical Exam:    VS:  BP 122/62   Pulse 64   Ht 5' 10"$  (1.778 m)   Wt 144 lb 6.4 oz (65.5 kg)   SpO2 97%   BMI 20.72 kg/m     Wt Readings from Last 3 Encounters:  07/01/22 144 lb 6.4 oz (65.5 kg)  06/15/22 139 lb (63 kg)  06/11/22 143 lb 12.8 oz (65.2 kg)     GEN:  Well nourished, well developed in no acute distress HEENT: Normal NECK: No JVD; No carotid bruits CARDIAC: RRR, no murmurs, rubs, gallops RESPIRATORY:  Clear to auscultation without rales, wheezing or rhonchi  ABDOMEN: Soft, non-tender, non-distended MUSCULOSKELETAL:  No edema; No deformity. 2+ pedal pulses, equal bilaterally SKIN: Warm and dry NEUROLOGIC:  Alert and oriented x 3 PSYCHIATRIC:  Normal affect   EKG:  EKG is ordered today.  The ekg ordered today demonstrates normal sinus rhythm at 64 bpm, RBBB, `inferior Q waves   Diagnoses:    1. S/P coronary artery stent placement   2. Coronary artery disease of native artery of native heart with stable angina pectoris ([place])  3. Hyperlipidemia LDL goal <70    Assessment and Plan:     CAD without angina: History of LAD stent 2008, LHC 06/15/2022 with RPAV 75% stenosis successfully treated with DES, no additional significant CAD elsewhere. He is feeling well, no chest pain, dyspnea, or other symptoms concerning for angina.  No indication for further ischemic evaluation at this time.  Briefly stopped aspirin due to minor bruising but will resume for DAPT for at least 6 months. No additional bleeding problems.  Continue aspirin, Plavix, metoprolol, rosuvastatin. Will refill Plavix today.   Hyperlipidemia LDL goal < 70: LDL 46 on 10/20/2020. We will recheck tomorrow when he will return for  fasting lipids/LFTs.  Continue rosuvastatin.  Hypertension:  BP is well controlled.     Cardiac Rehabilitation Eligibility Assessment       Disposition: Keep your May appointment with Dr. Johnsie Cancel  Medication Adjustments/Labs and Tests Ordered: Current medicines are reviewed at length with the patient today.  Concerns regarding medicines are outlined above.  Orders Placed This Encounter  Procedures   Lipid Profile   Hepatic function panel   EKG 12-Lead   Meds ordered this encounter  Medications   clopidogrel (PLAVIX) 75 MG tablet    Sig: Take 1 tablet (75 mg total) by mouth daily.    Dispense:  90 tablet    Refill:  3    Patient Instructions  Medication Instructions:   Your physician recommends that you continue on your current medications as directed. Please refer to the Current Medication list given to you today.   *If you need a refill on your cardiac medications before your next appointment, please call your pharmacy*  Lab Work:  Your physician recommends that you return for a FASTING lipid profile/lft. Friday, February 16. You can come in on the day of your appointment anytime between 7:30-4:30 fasting from midnight the night before.    If you have labs (blood work) drawn today and your tests are completely normal, you will receive your results only by: [place] (if you have MyChart) OR A paper copy in the mail If you have any lab test that is abnormal or we need to change your treatment, we will call you to review the results.   Testing/Procedures:  None ordered.   Follow-Up: At [facility], you and your health needs are our priority.  As part of our continuing mission to provide you with exceptional heart care, we have created designated Provider Care Teams.  These Care Teams include your primary Cardiologist (physician) and Advanced Practice Providers (APPs -  Physician Assistants and Nurse Practitioners) who all work together to provide you  with the care you need, when you need it.  We recommend signing up for the patient portal called "MyChart".  Sign up information is provided on this After Visit Summary.  MyChart is used to connect with patients for Virtual Visits (Telemedicine).  Patients are able to view lab/test results, encounter notes, upcoming appointments, etc.  Non-urgent messages can be sent to your provider as well.   To learn more about what you can do with MyChart, go to [email or web address].    Your next appointment:   3 month(s)  Provider:   [name], MD       Signed, [name], NP  07/01/2022 12:41 PM    [place]

## 2022-06-30 NOTE — Telephone Encounter (Signed)
Attempted to call patient in regards to Cardiac Rehab - LM on VM 

## 2022-07-01 ENCOUNTER — Ambulatory Visit: Payer: Medicare Other | Attending: Nurse Practitioner | Admitting: Nurse Practitioner

## 2022-07-01 ENCOUNTER — Encounter: Payer: Self-pay | Admitting: Nurse Practitioner

## 2022-07-01 VITALS — BP 122/62 | HR 64 | Ht 70.0 in | Wt 144.4 lb

## 2022-07-01 DIAGNOSIS — E785 Hyperlipidemia, unspecified: Secondary | ICD-10-CM | POA: Diagnosis not present

## 2022-07-01 DIAGNOSIS — Z955 Presence of coronary angioplasty implant and graft: Secondary | ICD-10-CM | POA: Diagnosis not present

## 2022-07-01 DIAGNOSIS — I2510 Atherosclerotic heart disease of native coronary artery without angina pectoris: Secondary | ICD-10-CM | POA: Diagnosis not present

## 2022-07-01 DIAGNOSIS — I10 Essential (primary) hypertension: Secondary | ICD-10-CM | POA: Diagnosis not present

## 2022-07-01 DIAGNOSIS — R3915 Urgency of urination: Secondary | ICD-10-CM | POA: Diagnosis not present

## 2022-07-01 DIAGNOSIS — R351 Nocturia: Secondary | ICD-10-CM | POA: Diagnosis not present

## 2022-07-01 MED ORDER — CLOPIDOGREL BISULFATE 75 MG PO TABS: 75.0000 mg | ORAL_TABLET | Freq: Every day | ORAL | 3 refills | Status: DC

## 2022-07-01 NOTE — Patient Instructions (Signed)
Medication Instructions:   Your physician recommends that you continue on your current medications as directed. Please refer to the Current Medication list given to you today.   *If you need a refill on your cardiac medications before your next appointment, please call your pharmacy*  Lab Work:  Your physician recommends that you return for a FASTING lipid profile/lft. Friday, February 16. You can come in on the day of your appointment anytime between 7:30-4:30 fasting from midnight the night before.    If you have labs (blood work) drawn today and your tests are completely normal, you will receive your results only by: [place] (if you have MyChart) OR A paper copy in the mail If you have any lab test that is abnormal or we need to change your treatment, we will call you to review the results.   Testing/Procedures:  None ordered.   Follow-Up: At [facility], you and your health needs are our priority.  As part of our continuing mission to provide you with exceptional heart care, we have created designated Provider Care Teams.  These Care Teams include your primary Cardiologist (physician) and Advanced Practice Providers (APPs -  Physician Assistants and Nurse Practitioners) who all work together to provide you with the care you need, when you need it.  We recommend signing up for the patient portal called "MyChart".  Sign up information is provided on this After Visit Summary.  MyChart is used to connect with patients for Virtual Visits (Telemedicine).  Patients are able to view lab/test results, encounter notes, upcoming appointments, etc.  Non-urgent messages can be sent to your provider as well.   To learn more about what you can do with MyChart, go to [email or web address].    Your next appointment:   3 month(s)  Provider:   [name], MD

## 2022-07-02 ENCOUNTER — Ambulatory Visit: Payer: Medicare Other | Attending: Nurse Practitioner

## 2022-07-02 DIAGNOSIS — Z955 Presence of coronary angioplasty implant and graft: Secondary | ICD-10-CM | POA: Diagnosis not present

## 2022-07-02 DIAGNOSIS — E785 Hyperlipidemia, unspecified: Secondary | ICD-10-CM | POA: Diagnosis not present

## 2022-07-02 DIAGNOSIS — I2510 Atherosclerotic heart disease of native coronary artery without angina pectoris: Secondary | ICD-10-CM

## 2022-07-03 LAB — LIPID PANEL
Chol/HDL Ratio: 2.2 ratio (ref 0.0–5.0)
Cholesterol, Total: 137 mg/dL (ref 100–199)
HDL: 62 mg/dL (ref >39)
LDL Chol Calc (NIH): 51 mg/dL (ref 0–99)
Triglycerides: 141 mg/dL (ref 0–149)
VLDL Cholesterol Cal: 24 mg/dL (ref 5–40)

## 2022-07-03 LAB — HEPATIC FUNCTION PANEL
ALT: 17 IU/L (ref 0–44)
AST: 21 IU/L (ref 0–40)
Albumin: 4.2 g/dL (ref 3.7–4.7)
Alkaline Phosphatase: 62 IU/L (ref 44–121)
Bilirubin Total: 0.4 mg/dL (ref 0.0–1.2)
Bilirubin, Direct: 0.12 mg/dL (ref 0.00–0.40)
Total Protein: 6.4 g/dL (ref 6.0–8.5)

## 2022-07-08 ENCOUNTER — Other Ambulatory Visit: Payer: Self-pay | Admitting: Internal Medicine

## 2022-07-08 DIAGNOSIS — F5104 Psychophysiologic insomnia: Secondary | ICD-10-CM

## 2022-07-15 ENCOUNTER — Telehealth (HOSPITAL_COMMUNITY): Payer: Self-pay

## 2022-07-15 DIAGNOSIS — L814 Other melanin hyperpigmentation: Secondary | ICD-10-CM | POA: Diagnosis not present

## 2022-07-15 DIAGNOSIS — Z85820 Personal history of malignant melanoma of skin: Secondary | ICD-10-CM | POA: Diagnosis not present

## 2022-07-15 DIAGNOSIS — D692 Other nonthrombocytopenic purpura: Secondary | ICD-10-CM | POA: Diagnosis not present

## 2022-07-15 DIAGNOSIS — L821 Other seborrheic keratosis: Secondary | ICD-10-CM | POA: Diagnosis not present

## 2022-07-15 DIAGNOSIS — L570 Actinic keratosis: Secondary | ICD-10-CM | POA: Diagnosis not present

## 2022-07-15 DIAGNOSIS — D485 Neoplasm of uncertain behavior of skin: Secondary | ICD-10-CM | POA: Diagnosis not present

## 2022-07-15 DIAGNOSIS — D1801 Hemangioma of skin and subcutaneous tissue: Secondary | ICD-10-CM | POA: Diagnosis not present

## 2022-07-15 DIAGNOSIS — L820 Inflamed seborrheic keratosis: Secondary | ICD-10-CM | POA: Diagnosis not present

## 2022-07-15 DIAGNOSIS — Z85828 Personal history of other malignant neoplasm of skin: Secondary | ICD-10-CM | POA: Diagnosis not present

## 2022-07-15 NOTE — Telephone Encounter (Signed)
Called and spoke with pt in regards to CR, pt stated he is not interested at this time.   Closed referral 

## 2022-07-23 ENCOUNTER — Encounter: Payer: Self-pay | Admitting: Internal Medicine

## 2022-08-21 IMAGING — MR MR SHOULDER*R* WO/W CM
8 series · 40 of 40 positions shown · IV contrast (multihance)
Comparison: None.

CLINICAL DATA: Shoulder pain, chronic, osteoarthritis suspected
shoulder pain

EXAM:
MRI OF THE RIGHT SHOULDER WITHOUT AND WITH CONTRAST
TECHNIQUE: Multiplanar, multisequence MR imaging of the RIGHT shoulder was
performed before and after the administration of intravenous
contrast.
CONTRAST:  13mL MULTIHANCE GADOBENATE DIMEGLUMINE 529 MG/ML IV SOLN

[Series 7: T2 fat-sat · axial · right · 3.0mm · 0.47mm/px · z∈[-93,+9]mm · 6 of 27 slices shown (1 of 3)]
[im 1/27]
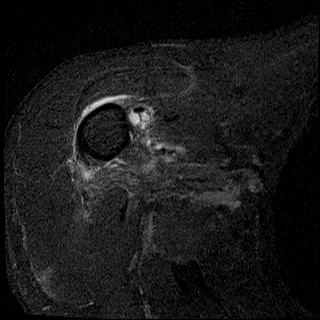
[im 6/27]
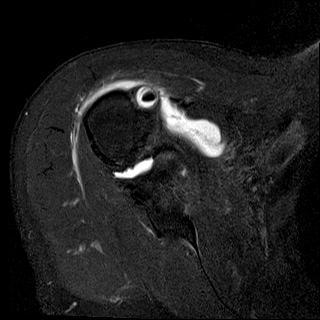
[im 11/27]
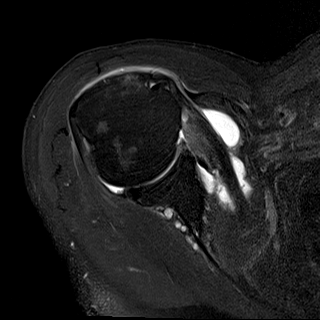
[im 16/27]
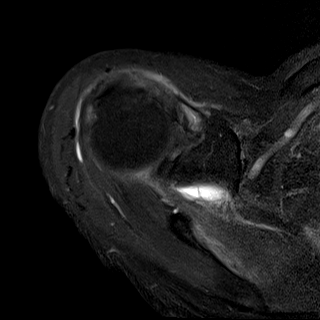
[im 21/27]
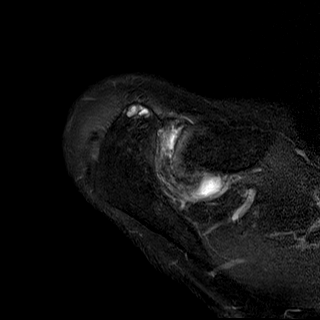
[im 27/27]
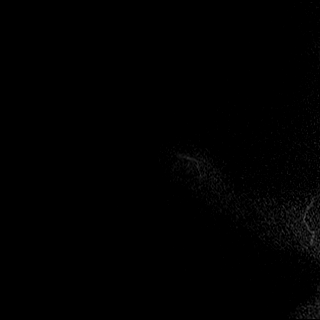

[Series 8: T2 fat-sat · sagittal · right · 3.0mm · 0.44mm/px · 6 of 26 slices shown (2 of 3)]
[im 1/26]
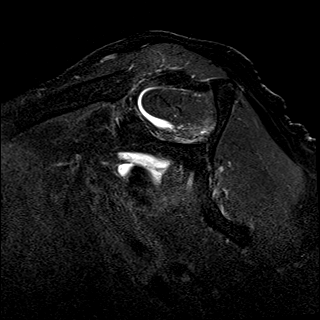
[im 6/26]
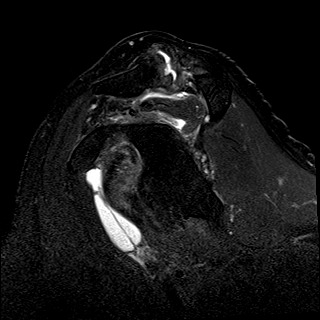
[im 11/26]
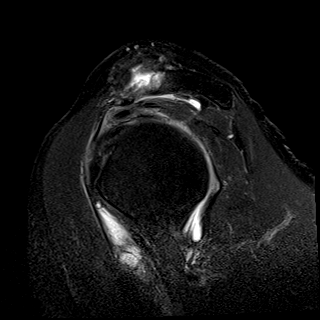
[im 16/26]
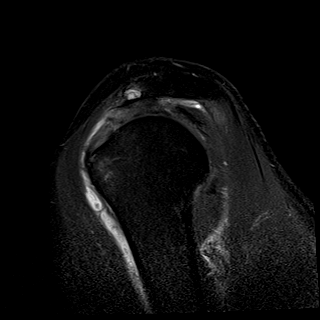
[im 21/26]
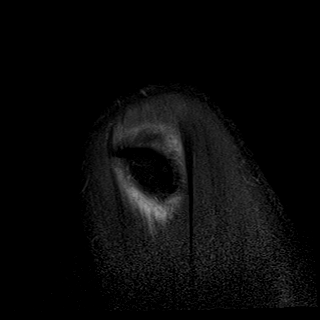
[im 26/26]
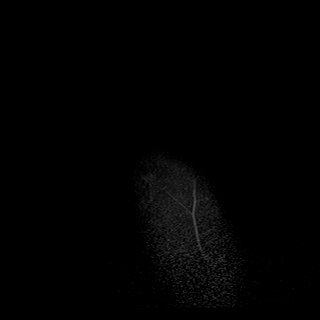

[Series 9: T1 · sagittal · right · 3.0mm · 0.55mm/px · 6 of 26 slices shown]
[im 1/26]
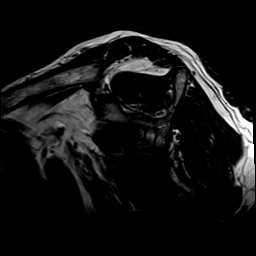
[im 6/26]
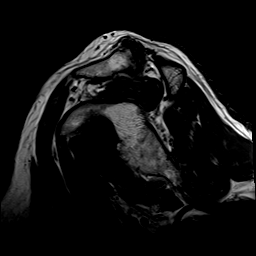
[im 11/26]
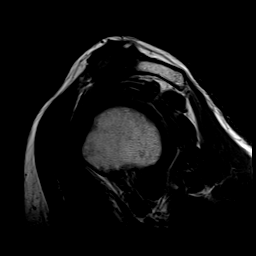
[im 16/26]
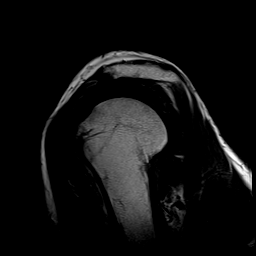
[im 21/26]
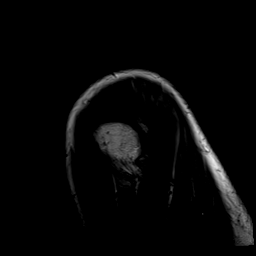
[im 26/26]
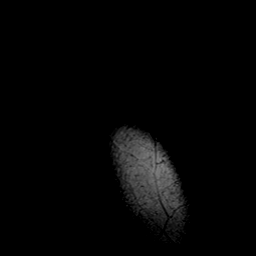

[Series 10: PD · coronal · right · 3.0mm · 0.44mm/px · 4 of 22 slices shown]
[im 1/22]
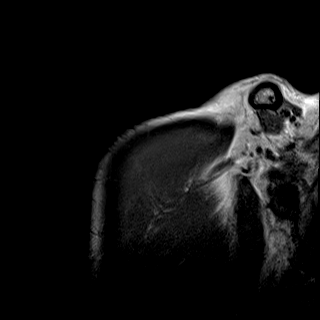
[im 8/22]
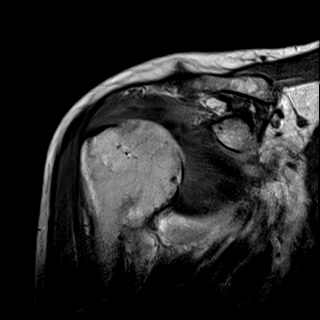
[im 15/22]
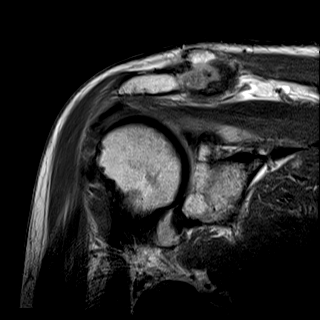
[im 22/22]
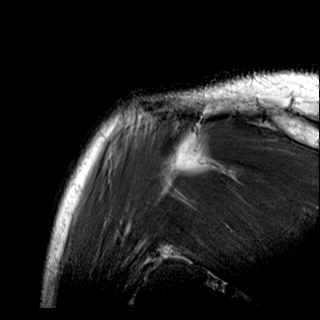

[Series 11: T2 fat-sat · coronal · right · 3.0mm · 0.44mm/px · 4 of 22 slices shown (3 of 3)]
[im 1/22]
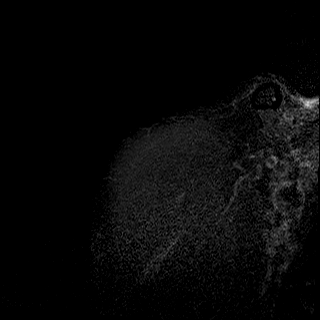
[im 8/22]
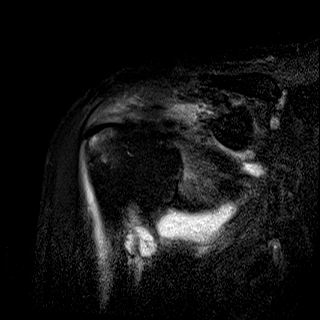
[im 15/22]
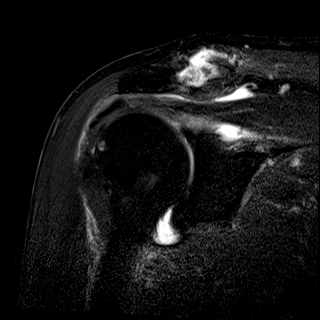
[im 22/22]
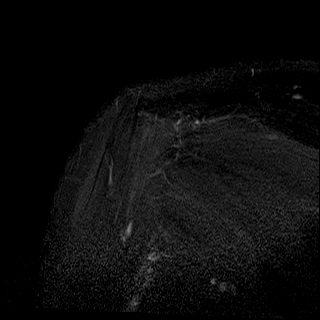

[Series 12: T1 fat-sat · axial · non-contrast · right · 3.0mm · 0.55mm/px · z∈[-93,+9]mm · 5 of 27 slices shown]
[im 1/27]
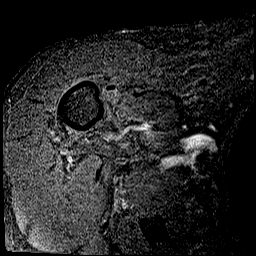
[im 7/27]
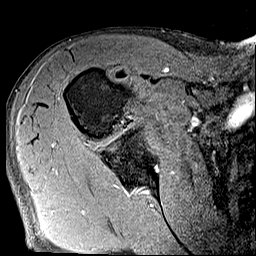
[im 14/27]
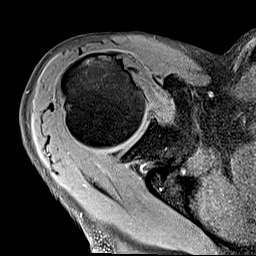
[im 20/27]
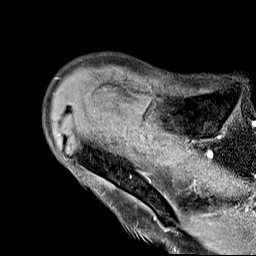
[im 27/27]
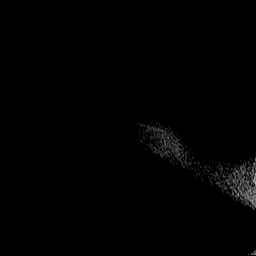

[Series 13: T1 fat-sat post-contrast · axial · right · 3.0mm · 0.55mm/px · z∈[-93,+9]mm · 5 of 27 slices shown (1 of 2)]
[im 1/27]
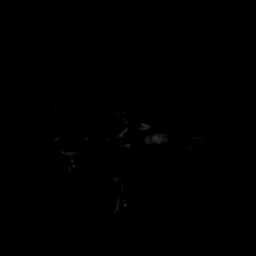
[im 7/27]
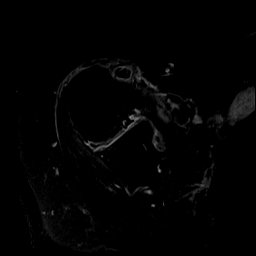
[im 14/27]
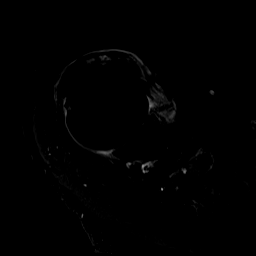
[im 20/27]
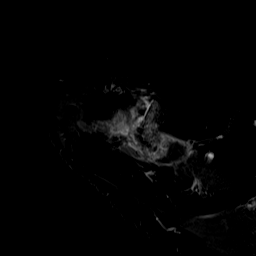
[im 27/27]
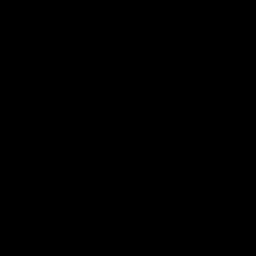

[Series 14: T1 fat-sat post-contrast · coronal · right · 3.0mm · 0.55mm/px · 4 of 22 slices shown (2 of 2)]
[im 1/22]
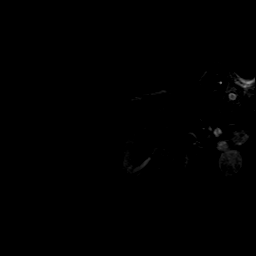
[im 8/22]
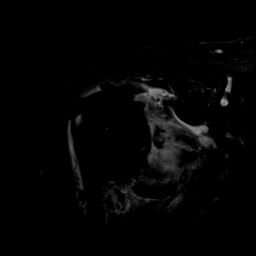
[im 15/22]
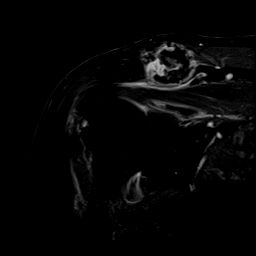
[im 22/22]
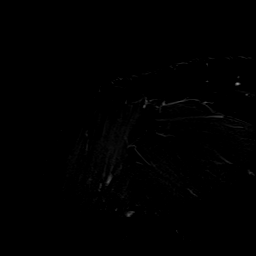

[40 of 40 positions shown; findings below may reference images not displayed]

FINDINGS: Rotator cuff: There is a full-thickness, full width tear of the
supraspinatus tendon with at least 2.6 cm retraction. There is
tendinosis and intermediate grade, partial width, articular sided
tearing of the mid to posterior infraspinatus tendon at the
footprint. Severe tendinosis of the subscapularis tendon with focal
low-grade articular sided tearing of the cephalad fibers at the
footprint.

Muscles: No significant muscle atrophy. There is muscle edema within
the supraspinatus and subscapularis, partially visualized.

Biceps Long Head: Intact intra and extra-articular long head biceps
tendon.

Acromioclavicular Joint: Moderate arthropathy of the
acromioclavicular joint, with joint effusion and pericapsular edema.
Small amount of subacromial/subdeltoid bursal fluid related to the
full-thickness cuff tear.

Glenohumeral Joint: Moderate size joint effusion, a significant
amount of which collecting along the subscapularis recess, with
synovial enhancement. Mild to moderate chondrosis.

Labrum: Degenerative anterior superior labral tearing.

Bones: No acute fracture or dislocation. Reactive bony edema along
the greater tuberosity. No focal bone lesion.

Other: No fluid collection or hematoma.
IMPRESSION: Full-thickness, full width tear of the supraspinatus tendon with a
least 2.6 cm retraction. Tendinosis and intermediate grade partial
width articular sided tearing of the infraspinatus tendon at the
footprint.

Severe tendinosis of the distal subscapularis tendon with focal
low-grade articular sided tearing of the cephalad fibers at the
footprint.

Mild to moderate glenohumeral arthritis with degenerative anterior
superior labral tearing.

Moderate AC joint arthropathy, with effusion and pericapsular edema.

Moderate-sized joint effusion.

## 2022-09-13 ENCOUNTER — Other Ambulatory Visit (HOSPITAL_COMMUNITY): Payer: Self-pay

## 2022-09-13 ENCOUNTER — Other Ambulatory Visit: Payer: Self-pay

## 2022-09-14 NOTE — Progress Notes (Signed)
Patient ID: [name] [name], male   DOB: August 06, 1932, 89 y.o.   MRN: [id number]     89 y.o. history of CAD. Stent to LAD in 2008.  ETT in 2013 abnormal with NSVT. Cath 05/01/12 30% ISR LaD and distal RCA 60-75% with FFR CT .92 Rx medically Myovue done 04/27/17 reviewed and normal with no ischemia study not gated due to PAC;s EF normal echo 06/25/15 55-60% LA only mildly dilated Had another myovue 04/22/20 which was normal with no ischemia EF estimtated 53%  Has Occular migraines Been going on for over 3-4 years  Seen by neurology And MRI/MRA no abnormality and EEG no epileptic foci  01/11/14  1-39% bilateral carotid disease   Has lower lumbar disc issue and bilateral hip pain Has had repeat injection by Dr Katrinka Blazing Also with right [facility] joint arthritis most recent injection 09/06/21 Has had Platelet Rich Plasma injections for his hips as well   03/2020 Went to [place] to visit his cousin [name] who is a famous Horticulturist, commercial in [place] has a hypochondriac for husband They moved onto my [address] at [facility] last year  Daughter in [place] not working as PA but he likes her husband better Son works with him and got married for first time at age 77 to pharmacist at [facility] [employer]  Had a syncopal episode July  4th. 2022 Started with visual changes then BP low  80's with pre syncope  Felt weak for a couple hours. Had two drinks prior Not very hot out Occurred around 6 o'clock No  Chest pain, dyspnea Went to sleep and felt fine with no recurrence   Monitor: 12/29/20   showed no PAF but 8% burden atrial ectopy with self limited runs of SVT   He is building a [employer] on [address] Then will do one with a [name] on [address]   06/11/22:   had ? Angina. Tight pain in shoulders and back of neck with exertion that dissipates with rest Reproducible with similar degrees of stress No rest pain Also has had lower BP and we told him to hold his Zestril   Discussed prudence of heart cath given known distant history of  CAD/Stent and moderate residual Dx over 10 years ago  Cath done . 06/15/22 Dr Eldridge Dace patent stent in LAD 75% PLB stenosis with new stent placed EF was normal  Angina gone very active walking his 2 dogs and working full time Aggravated by red tape in starting [employer] and [employer]. Has a new place in [place]   ROS: Denies fever, malais, weight loss, blurry vision, decreased visual acuity, cough, sputum, SOB, hemoptysis, pleuritic pain, palpitaitons, heartburn, abdominal pain, melena, lower extremity edema, claudication, or rash.  All other systems reviewed and negative  General: BP 110/68   Pulse 61   Ht 5\' 10"  (1.778 m)   Wt 145 lb 3.2 oz (65.9 kg)   SpO2 97%   BMI 20.83 kg/m  Affect appropriate Healthy:  appears stated age HEENT: normal Neck supple with no adenopathy JVP normal no bruits no thyromegaly Lungs clear with no wheezing and good diaphragmatic motion Heart:  S1/S2 no murmur, no rub, gallop or click PMI normal Abdomen: benighn, BS positve, no tenderness, no AAA no bruit.  No HSM or HJR Distal pulses intact with no bruits No edema Neuro non-focal Skin warm and dry No muscular weakness     Current Outpatient Medications  Medication Sig Dispense Refill   ALPRAZolam (XANAX) 0.25  MG tablet Take 1 tablet (0.25 mg total) by mouth 2 (two) times daily as needed for anxiety. 60 tablet 3   aspirin EC 81 MG tablet Take 1 tablet (81 mg total) by mouth daily. Swallow whole. 30 tablet 5   Cholecalciferol (VITAMIN D3) 50 MCG (2000 UT) capsule Take 2,000 Units by mouth daily.     clopidogrel (PLAVIX) 75 MG tablet Take 1 tablet (75 mg total) by mouth daily. 90 tablet 3   diazepam (VALIUM) 5 MG tablet Take 5 mg by mouth at bedtime.     metoprolol succinate (TOPROL-XL) 50 MG 24 hr tablet Take 1 tablet (50 mg total) by mouth daily. 90 tablet 3   nitroGLYCERIN (NITROSTAT) 0.4 MG SL tablet Place 1 tablet (0.4 mg total) under the tongue every 5 (five) minutes as needed for  chest pain. 100 tablet 3   Polyethyl Glycol-Propyl Glycol (SYSTANE ULTRA) 0.4-0.3 % SOLN Place 1 drop into both eyes 2 (two) times daily.     psyllium (METAMUCIL) 58.6 % packet Take 1 packet by mouth daily.     rosuvastatin (CRESTOR) 40 MG tablet Take 1 tablet (40 mg total) by mouth daily. 90 tablet 2   tamsulosin (FLOMAX) 0.4 MG CAPS capsule Take 0.8 mg by mouth every morning.     traZODone (DESYREL) 50 MG tablet TAKE 1/2 TO 1 TABLET BY MOUTH DAILY AT BEDTIME 30 tablet 1   zolpidem (AMBIEN) 10 MG tablet TAKE 1 TABLET(10 MG) BY MOUTH AT BEDTIME AS NEEDED FOR SLEEP 90 tablet 0   lisinopril (ZESTRIL) 10 MG tablet Take 1 tablet (10 mg total) by mouth daily. 90 tablet 3   No current facility-administered medications for this visit.    Allergies  Patient has no known allergies.  Electrocardiogram:  09/24/2022  SR rate 70 RBBB  no changes   Assessment and Plan  CAD: LAD stent in 2008 06/15/22 new stent to PLB of RCA Continue DAT Angina resolved  Continue beta blocker and statin   HLD : on generic crestor now  Lab Results  Component Value Date   LDLCALC 51 07/02/2022    GERD:with constipation not helped with Miralax, ducolox or Linzess f/u Dr Teryl Lucy:  Chronic on ambien f/u Dr Yetta Barre   Visual Disturbance:  MRI/MRA normal EEG normal has been seen by neurology ? Occular migraines   Ortho:  f/u Dr Katrinka Blazing for bilateral hip pain post injections MRI L3,4 disc bulge as well as right AC joint arthritis   Syncope:   Normal myovue 04/22/20 Labs ok Monitor with mostly atrial ectopy continue beta blocker BP lower now again ? Related to CAD Weight stable not postural D/c Zestril    F/U  6 months   Charlton Haws

## 2022-09-24 ENCOUNTER — Ambulatory Visit: Payer: Medicare Other | Attending: Cardiovascular Disease | Admitting: Cardiovascular Disease

## 2022-09-24 ENCOUNTER — Encounter: Payer: Self-pay | Admitting: Cardiovascular Disease

## 2022-09-24 VITALS — BP 110/68 | HR 61 | Ht 70.0 in | Wt 145.2 lb

## 2022-09-24 DIAGNOSIS — I2510 Atherosclerotic heart disease of native coronary artery without angina pectoris: Secondary | ICD-10-CM | POA: Diagnosis not present

## 2022-09-24 DIAGNOSIS — Z955 Presence of coronary angioplasty implant and graft: Secondary | ICD-10-CM | POA: Diagnosis not present

## 2022-09-24 DIAGNOSIS — E785 Hyperlipidemia, unspecified: Secondary | ICD-10-CM | POA: Diagnosis not present

## 2022-09-24 DIAGNOSIS — I10 Essential (primary) hypertension: Secondary | ICD-10-CM | POA: Diagnosis not present

## 2022-09-24 MED ORDER — LISINOPRIL 10 MG PO TABS: 10.0000 mg | ORAL_TABLET | Freq: Every day | ORAL | 3 refills | Status: DC

## 2022-09-24 NOTE — Patient Instructions (Signed)
Medication Instructions:  Your physician recommends that you continue on your current medications as directed. Please refer to the Current Medication list given to you today.  *If you need a refill on your cardiac medications before your next appointment, please call your pharmacy*  Lab Work: If you have labs (blood work) drawn today and your tests are completely normal, you will receive your results only by: MyChart Message (if you have MyChart) OR A paper copy in the mail If you have any lab test that is abnormal or we need to change your treatment, we will call you to review the results.  Testing/Procedures: None ordered today.  Follow-Up: At [facility], you and your health needs are our priority.  As part of our continuing mission to provide you with exceptional heart care, we have created designated Provider Care Teams.  These Care Teams include your primary Cardiologist (physician) and Advanced Practice Providers (APPs -  Physician Assistants and Nurse Practitioners) who all work together to provide you with the care you need, when you need it.  We recommend signing up for the patient portal called "MyChart".  Sign up information is provided on this After Visit Summary.  MyChart is used to connect with patients for Virtual Visits (Telemedicine).  Patients are able to view lab/test results, encounter notes, upcoming appointments, etc.  Non-urgent messages can be sent to your provider as well.   To learn more about what you can do with MyChart, go to [email or web address].    Your next appointment:   6 month(s)  Provider:   Peter Nishan, MD     

## 2022-10-20 ENCOUNTER — Other Ambulatory Visit (HOSPITAL_COMMUNITY): Payer: Self-pay

## 2022-11-01 ENCOUNTER — Other Ambulatory Visit: Payer: Self-pay | Admitting: Cardiovascular Disease

## 2022-11-12 ENCOUNTER — Telehealth: Payer: Self-pay | Admitting: Internal Medicine

## 2022-11-12 NOTE — Telephone Encounter (Signed)
Prescription Request  11/12/2022  LOV: 12/29/2021  What is the name of the medication or equipment? zolpidem  Have you contacted your pharmacy to request a refill? Yes   Which pharmacy would you like this sent to?  [id number] [place], [place] - [address] AT [facility] [address] [facility] [address] [place] [address] Phone: [phone] Fax: [phone]    Patient notified that their request is being sent to the clinical staff for review and that they should receive a response within 2 business days.   Please advise at Mobile [phone] (mobile)

## 2022-11-12 NOTE — Telephone Encounter (Signed)
OV needed  Pt scheduled for 7/1 @ 11am.

## 2022-11-15 ENCOUNTER — Other Ambulatory Visit: Payer: Self-pay

## 2022-11-15 ENCOUNTER — Ambulatory Visit (INDEPENDENT_AMBULATORY_CARE_PROVIDER_SITE_OTHER): Payer: Medicare Other | Admitting: Internal Medicine

## 2022-11-15 ENCOUNTER — Encounter: Payer: Self-pay | Admitting: Internal Medicine

## 2022-11-15 VITALS — BP 132/62 | HR 66 | Temp 97.6°F | Resp 16 | Ht 70.0 in | Wt 143.0 lb

## 2022-11-15 DIAGNOSIS — F5104 Psychophysiologic insomnia: Secondary | ICD-10-CM

## 2022-11-15 DIAGNOSIS — I10 Essential (primary) hypertension: Secondary | ICD-10-CM | POA: Diagnosis not present

## 2022-11-15 DIAGNOSIS — E785 Hyperlipidemia, unspecified: Secondary | ICD-10-CM | POA: Diagnosis not present

## 2022-11-15 DIAGNOSIS — F411 Generalized anxiety disorder: Secondary | ICD-10-CM

## 2022-11-15 MED ORDER — ALPRAZOLAM 0.25 MG PO TABS: 0.2500 mg | ORAL_TABLET | Freq: Two times a day (BID) | ORAL | 3 refills | Status: DC | PRN

## 2022-11-15 MED ORDER — ZOLPIDEM TARTRATE 10 MG PO TABS: 10.0000 mg | ORAL_TABLET | Freq: Every evening | ORAL | 0 refills | Status: DC | PRN

## 2022-11-15 NOTE — Progress Notes (Signed)
Subjective:  Patient ID: [name] [name], male    DOB: February 15, 1933  Age: 89 y.o. MRN: [id number]  CC: Hypertension   HPI [name] presents for f/up --  Discussed the use of AI scribe software for clinical note transcription with the patient, who gave verbal consent to proceed.  History of Present Illness   The patient, an 89 year old with a history of cardiac disease, presented for a prescription refill for anxiety and insomnia medications. They reported no current health issues or pains, which they considered fortunate compared to their peers. Their daily activities included walking one to two miles per day and working in [employer].  Despite their cardiac history, they reported no recent palpitations or chest pain. They also mentioned carrying nitroglycerin for the past 15 years but never having to use it.       Outpatient Medications Prior to Visit  Medication Sig Dispense Refill   aspirin EC 81 MG tablet Take 1 tablet (81 mg total) by mouth daily. Swallow whole. 30 tablet 5   Cholecalciferol (VITAMIN D3) 50 MCG (2000 UT) capsule Take 2,000 Units by mouth daily.     clopidogrel (PLAVIX) 75 MG tablet Take 1 tablet (75 mg total) by mouth daily. 90 tablet 3   diazepam (VALIUM) 5 MG tablet Take 5 mg by mouth at bedtime.     lisinopril (ZESTRIL) 10 MG tablet Take 1 tablet (10 mg total) by mouth daily. 90 tablet 3   metoprolol succinate (TOPROL-XL) 50 MG 24 hr tablet Take 1 tablet (50 mg total) by mouth daily. 90 tablet 3   nitroGLYCERIN (NITROSTAT) 0.4 MG SL tablet Place 1 tablet (0.4 mg total) under the tongue every 5 (five) minutes as needed for chest pain. 100 tablet 3   Polyethyl Glycol-Propyl Glycol (SYSTANE ULTRA) 0.4-0.3 % SOLN Place 1 drop into both eyes 2 (two) times daily.     psyllium (METAMUCIL) 58.6 % packet Take 1 packet by mouth daily.     rosuvastatin (CRESTOR) 40 MG tablet TAKE 1 TABLET DAILY 90 tablet 3   tamsulosin (FLOMAX) 0.4 MG CAPS  capsule Take 0.8 mg by mouth every morning.     traZODone (DESYREL) 50 MG tablet TAKE 1/2 TO 1 TABLET BY MOUTH DAILY AT BEDTIME 30 tablet 1   ALPRAZolam (XANAX) 0.25 MG tablet Take 1 tablet (0.25 mg total) by mouth 2 (two) times daily as needed for anxiety. 60 tablet 3   zolpidem (AMBIEN) 10 MG tablet TAKE 1 TABLET(10 MG) BY MOUTH AT BEDTIME AS NEEDED FOR SLEEP 90 tablet 0   No facility-administered medications prior to visit.    ROS Review of Systems  Constitutional:  Negative for diaphoresis and fatigue.  HENT: Negative.    Eyes: Negative.   Respiratory:  Negative for cough, chest tightness, shortness of breath and wheezing.   Cardiovascular:  Negative for chest pain and leg swelling.  Gastrointestinal: Negative.  Negative for abdominal pain, diarrhea, nausea and vomiting.  Endocrine: Negative.   Genitourinary: Negative.  Negative for difficulty urinating.  Musculoskeletal: Negative.  Negative for arthralgias and myalgias.  Neurological:  Negative for dizziness, weakness, light-headedness and headaches.  Hematological:  Negative for adenopathy. Does not bruise/bleed easily.  Psychiatric/Behavioral:  Positive for sleep disturbance. Negative for confusion, decreased concentration, dysphoric mood and suicidal ideas. The patient is nervous/anxious.     Objective:  BP 132/62 (BP Location: Left Arm, Patient Position: Sitting, Cuff Size: Large)   Pulse 66   Temp 97.6 F (36.4 C) (  Oral)   Resp 16   Ht 5\' 10"  (1.778 m)   Wt 143 lb (64.9 kg)   SpO2 93%   BMI 20.52 kg/m   BP Readings from Last 3 Encounters:  11/15/22 132/62  09/24/22 110/68  07/01/22 122/62    Wt Readings from Last 3 Encounters:  11/15/22 143 lb (64.9 kg)  09/24/22 145 lb 3.2 oz (65.9 kg)  07/01/22 144 lb 6.4 oz (65.5 kg)    Physical Exam Vitals reviewed.  Constitutional:      Appearance: Normal appearance.  HENT:     Mouth/Throat:     Mouth: Mucous membranes are moist.  Eyes:     General: No scleral  icterus.    Conjunctiva/sclera: Conjunctivae normal.  Cardiovascular:     Rate and Rhythm: Normal rate and regular rhythm.     Heart sounds: No murmur heard. Pulmonary:     Effort: Pulmonary effort is normal.     Breath sounds: No stridor. No wheezing, rhonchi or rales.  Abdominal:     General: Abdomen is flat.     Palpations: There is no mass.     Tenderness: There is no abdominal tenderness. There is no guarding.     Hernia: No hernia is present.  Musculoskeletal:        General: Normal range of motion.     Cervical back: Neck supple.     Right lower leg: No edema.     Left lower leg: No edema.  Lymphadenopathy:     Cervical: No cervical adenopathy.  Skin:    General: Skin is warm and dry.  Neurological:     General: No focal deficit present.     Mental Status: He is alert. Mental status is at baseline.  Psychiatric:        Mood and Affect: Mood normal.        Behavior: Behavior normal.     Lab Results  Component Value Date   WBC 4.4 06/11/2022   HGB 13.2 06/11/2022   HCT 40.5 06/11/2022   PLT 182 06/11/2022   GLUCOSE 96 06/11/2022   CHOL 137 07/02/2022   TRIG 141 07/02/2022   HDL 62 07/02/2022   LDLDIRECT 132.2 03/10/2007   LDLCALC 51 07/02/2022   ALT 17 07/02/2022   AST 21 07/02/2022   NA 141 06/11/2022   K 4.1 06/11/2022   CL 106 06/11/2022   CREATININE 1.09 06/11/2022   BUN 17 06/11/2022   CO2 21 06/11/2022   TSH 2.88 10/21/2021   PSA 0.44 05/10/2011   INR 1.0 04/27/2012    CARDIAC CATHETERIZATION  Result Date: 06/15/2022   Dist LAD lesion is 40% stenosed.  Eccentric lesion occurs at a bifurcation with the last significant diagonal branch.   Prox LAD lesion is 10% stenosed.   RPAV lesion is 75% stenosed.   A drug-eluting stent was successfully placed using a SYNERGY XD 2.75X24 mm, postdilated to greater than 3.25 mm.   Post intervention, there is a 0% residual stenosis.   The left ventricular systolic function is normal.   LV end diastolic pressure is  normal.   The left ventricular ejection fraction is 55-65% by visual estimate.   There is no aortic valve stenosis. Successful PCI of the posterolateral artery.  Initially, the stenosis looked longer but there was partial resolution with intracoronary nitroglycerin.  We were able to use a shorter stent than initially planned.  Of note, he also had some vasospasm in the right radial artery.  If he  had further anginal symptoms, could consider isosorbide. Plan for same-day discharge.  Results conveyed to Alba Cory, [phone].    Assessment & Plan:   GAD (generalized anxiety disorder) -     ALPRAZolam; Take 1 tablet (0.25 mg total) by mouth 2 (two) times daily as needed for anxiety.  Dispense: 60 tablet; Refill: 3  Hyperlipidemia with target LDL less than 70 - LDL goal achieved. Doing well on the statin   Essential hypertension - His blood pressure is well-controlled.  Psychophysiological insomnia -     Zolpidem Tartrate; Take 1 tablet (10 mg total) by mouth at bedtime as needed for sleep.  Dispense: 90 tablet; Refill: 0     Follow-up: Return in about 3 months (around 02/15/2023).  Sanda Linger, MD

## 2022-11-16 ENCOUNTER — Ambulatory Visit (INDEPENDENT_AMBULATORY_CARE_PROVIDER_SITE_OTHER): Payer: Medicare Other

## 2022-11-16 VITALS — BP 132/62 | Ht 70.0 in | Wt 143.0 lb

## 2022-11-16 DIAGNOSIS — Z0000 Encounter for general adult medical examination without abnormal findings: Secondary | ICD-10-CM | POA: Diagnosis not present

## 2022-11-16 NOTE — Progress Notes (Signed)
Subjective:   [name] [name] is a 89 y.o. male who presents for Medicare Annual/Subsequent preventive examination.  Visit Complete: Virtual  I connected with  [name] [name] on 11/16/22 by a audio enabled telemedicine application and verified that I am speaking with the correct person using two identifiers.  Patient Location: Home  Provider Location: Office/Clinic  I discussed the limitations of evaluation and management by telemedicine. The patient expressed understanding and agreed to proceed.  Patient Medicare AWV questionnaire was completed by the patient on 11/12/2022; I have confirmed that all information answered by patient is correct and no changes since this date.  Review of Systems     Cardiac Risk Factors include: advanced age (>69men, >89 women);dyslipidemia;hypertension;microalbuminuria;family history of premature cardiovascular disease;male gender     Objective:    Today's Vitals   11/16/22 1049 11/16/22 1050  BP: 132/62   Weight: 143 lb (64.9 kg)   Height: 5\' 10"  (1.778 m)   PainSc: 0-No pain 0-No pain   Body mass index is 20.52 kg/m.     11/16/2022   10:51 AM 06/15/2022    5:53 AM 11/12/2021   11:57 AM 11/11/2020    8:48 AM 11/26/2019    3:20 PM 09/17/2019    8:20 AM 09/13/2018    2:31 PM  Advanced Directives  Does Patient Have a Medical Advance Directive? Yes Yes Yes Yes No Yes Yes  Type of Estate agent of [place];Living will Healthcare Power of [place];Living will Healthcare Power of [place];Living will Living will;Healthcare Power of [employer] Power of [place];Living will Healthcare Power of [place];Living will  Does patient want to make changes to medical advance directive?  No - Patient declined No - Patient declined No - Patient declined  No - Patient declined   Copy of Healthcare Power of Attorney in Chart? No - copy requested No - copy requested No - copy requested No - copy requested  No - copy requested No - copy  requested  Would patient like information on creating a medical advance directive?     No - Patient declined      Current Medications (verified) Outpatient Encounter Medications as of 11/16/2022  Medication Sig   ALPRAZolam (XANAX) 0.25 MG tablet Take 1 tablet (0.25 mg total) by mouth 2 (two) times daily as needed for anxiety.   aspirin EC 81 MG tablet Take 1 tablet (81 mg total) by mouth daily. Swallow whole.   Cholecalciferol (VITAMIN D3) 50 MCG (2000 UT) capsule Take 2,000 Units by mouth daily.   clopidogrel (PLAVIX) 75 MG tablet Take 1 tablet (75 mg total) by mouth daily.   diazepam (VALIUM) 5 MG tablet Take 5 mg by mouth at bedtime.   lisinopril (ZESTRIL) 10 MG tablet Take 1 tablet (10 mg total) by mouth daily.   metoprolol succinate (TOPROL-XL) 50 MG 24 hr tablet Take 1 tablet (50 mg total) by mouth daily.   nitroGLYCERIN (NITROSTAT) 0.4 MG SL tablet Place 1 tablet (0.4 mg total) under the tongue every 5 (five) minutes as needed for chest pain.   Polyethyl Glycol-Propyl Glycol (SYSTANE ULTRA) 0.4-0.3 % SOLN Place 1 drop into both eyes 2 (two) times daily.   psyllium (METAMUCIL) 58.6 % packet Take 1 packet by mouth daily.   rosuvastatin (CRESTOR) 40 MG tablet TAKE 1 TABLET DAILY   tamsulosin (FLOMAX) 0.4 MG CAPS capsule Take 0.8 mg by mouth every morning.   traZODone (DESYREL) 50 MG tablet TAKE 1/2 TO 1 TABLET BY MOUTH DAILY  AT BEDTIME   zolpidem (AMBIEN) 10 MG tablet Take 1 tablet (10 mg total) by mouth at bedtime as needed for sleep.   No facility-administered encounter medications on file as of 11/16/2022.    Allergies (verified) Patient has no known allergies.   History: Past Medical History:  Diagnosis Date   Anxiety    CAD (coronary artery disease)    a. s/p stent to LAD 2008;  b. abnormal ETT 11/13 with VTach => LHC pLAD 30% ISR, mCFX 30%, dRCA 60-75% which had progressed from previous study in 2010 => FFR of RCA 0.92 => med Rx    Carotid stenosis    dopplers 1/14:  0-39%  bilateral ICA stenosis   Colon polyps    Colon polyps    adenomatous   Diverticular disease of colon    Hemorrhoids    external and internal   History of BPH    HTN (hypertension)    Hx of echocardiogram    a. Echo 9/11:  EF 55-60%, normal motion, grade 2 diastolic dysfunction, mild MR, mild LAE, mild RAE, PASP 33.   Hyperlipidemia    IBS (irritable bowel syndrome)    Rhinitis    Stroke, lacunar (HCC) 08/24/2012   Old, 2011 head mri   Past Surgical History:  Procedure Laterality Date   APPENDECTOMY  1956   CORONARY STENT INTERVENTION N/A 06/15/2022   Procedure: CORONARY STENT INTERVENTION;  Surgeon: Corky Crafts, MD;  Location: [facility] INVASIVE CV LAB;  Service: Cardiovascular;  Laterality: N/A;   CORONARY STENT PLACEMENT  2008    Successful PCI of the lesion in the proximal LAD using a Promus    HERNIA REPAIR Right 2005   LEFT HEART CATH AND CORONARY ANGIOGRAPHY N/A 06/15/2022   Procedure: LEFT HEART CATH AND CORONARY ANGIOGRAPHY;  Surgeon: Corky Crafts, MD;  [facility]: [facility] INVAS[facility] CV LAB;  Service: Cardiovascular;  Laterality: N/A;   NASAL SINUS SURGERY  1975   PERCUTANEOUS CORONARY STENT INTERVENTION (PCI-S) N/A 05/03/2012   Procedure: PERCUTANEOUS CORONARY STENT INTERVENTION (PCI-S);  Surgeon: Tonny Bollman, MD;  Location: [facility] CATH LAB;  Service: Cardiovascular;  Laterality: N/A;   Promus DES ok for 3T MRI  2008   Family History  Problem Relation Age of Onset   Coronary artery disease Father    Heart attack Father    Rheum arthritis Mother    Cancer Brother        ? TYPE   Colon cancer Neg Hx    Stomach cancer Neg Hx    Pancreatic cancer Neg Hx    Social History   Socioeconomic History   Marital status: Married    Spouse name: Not on file   Number of children: 3   Years of education: 16   Highest education level: Not on file  Occupational History   Occupation: businessman, Financial planner: RETIRED  Tobacco Use   Smoking status: Never     Passive exposure: Never   Smokeless tobacco: Never  Vaping Use   Vaping Use: Never used  Substance and Sexual Activity   Alcohol use: Not Currently    Comment: 4-6 drinks/week   Drug use: No   Sexual activity: Yes    Partners: Female  Other Topics Concern   Not on file  Social History Narrative   HSG, Engineer, 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 He has two daughters, 1 in [place] 1  in [place] (Dec '12) and a son who works with him. Several grandchildren.Reports that he spends a good deal of time at his home in [facility] which he greatly enjoys and his home [place]. Enjoys driving his BMW [place] when [place].      ACP - Yes CPR, yes for short-term mechanical ventilation for reversible disease. Recommended [email or web address] for consideration.   Social Determinants of Health   Financial Resource Strain: Low Risk  (11/16/2022)   Overall Financial Resource Strain (CARDIA)    Difficulty of Paying Living Expenses: Not hard at all  Food Insecurity: No Food Insecurity (11/16/2022)   Hunger Vital Sign    Worried About Running Out of Food in the Last Year: Never true    Ran Out of Food in the Last Year: Never true  Transportation Needs: No Transportation Needs (11/16/2022)   PRAPARE - Administrator, Civil Service (Medical): No    Lack of Transportation (Non-Medical): No  Physical Activity: Sufficiently Active (11/16/2022)   Exercise Vital Sign    Days of Exercise per Week: 6 days    Minutes of Exercise per Session: 30 min  Stress: No Stress Concern Present (11/16/2022)   [name] of Occupational Health - Occupational Stress Questionnaire    Feeling of Stress : Not at all  Social Connections: Unknown (11/16/2022)   Social Connection and Isolation Panel [NHANES]    Frequency of Communication with Friends and Family: More than three times a week     Frequency of Social Gatherings with Friends and Family: Once a week    Attends Religious Services: Not on Marketing executive or Organizations: No    Attends Engineer, structural: 1 to 4 times per year    Marital Status: Married    Tobacco Counseling Counseling given: Not Answered   Clinical Intake:  Pre-visit preparation completed: Yes  Pain : No/denies pain Pain Score: 0-No pain     BMI - recorded: 20.52 Nutritional Status: BMI of 19-24  Normal Nutritional Risks: None Diabetes: No  How often do you need to have someone help you when you read instructions, pamphlets, or other written materials from your doctor or pharmacy?: 1 - Never What is the last grade level you completed in school?: Graduate School  Interpreter Needed?: No  Information entered by :: Sarah-Jane Nazario N. Marry Kusch, LPN.   Activities of Daily Living    11/16/2022   10:55 AM 11/12/2022   10:29 AM  In your present state of health, do you have any difficulty performing the following activities:  Hearing? 0 1  Vision? 0 0  Difficulty concentrating or making decisions? 0 0  Walking or climbing stairs? 0 0  Dressing or bathing? 0 0  Doing errands, shopping? 0 0  Preparing Food and eating ? N N  Using the Toilet? N N  In the past six months, have you accidently leaked urine? N N  Do you have problems with loss of bowel control? N N  Managing your Medications? N N  Managing your Finances? N N  Housekeeping or managing your Housekeeping? N N    Patient Care Team: Etta Grandchild, MD as PCP - General (Internal Medicine) Wendall Stade, MD as PCP - Cardiology (Cardiology) Alfredo Martinez, MD as Consulting Physician (Urology)  Indicate any recent Medical Services you may have received from other than [facility] providers in the past year (date may be approximate).     Assessment:  This is a routine wellness examination for [facility].  Hearing/Vision screen Hearing Screening - Comments:: No  hearing aids. 100% hearing loss in left ear. Vision Screening - Comments:: Wears rx glasses - up to date with routine eye exams with Sinda Du, MD.   Dietary issues and exercise activities discussed:     Goals Addressed             This Visit's Progress    My healthcare goal for 2024 is to maintain my current health status by continuing to eat healthy, stay independent, physically and socially active.        Depression Screen    11/16/2022   10:53 AM 11/15/2022   11:14 AM 11/12/2021   11:55 AM 11/11/2020    8:47 AM 10/20/2020    8:04 AM 09/17/2019    8:20 AM 09/13/2018    2:31 PM  PHQ 2/9 Scores  PHQ - 2 Score 0 0 0 0 0 0 0  PHQ- 9 Score 0 0         Fall Risk    11/16/2022   10:52 AM 11/12/2022   10:29 AM 11/12/2021   11:36 AM 11/11/2020    8:50 AM 10/20/2020    8:04 AM  Fall Risk   Falls in the past year? 0 0 1 0 0  Number falls in past yr: 0 0 0 0   Injury with Fall? 0 0 0 0   Risk for fall due to : No Fall Risks  No Fall Risks No Fall Risks   Follow up Falls prevention discussed  Falls evaluation completed Falls evaluation completed     MEDICARE RISK AT HOME:  Medicare Risk at Home - 11/16/22 1100     Any stairs in or around the home? Yes    If so, are there any without handrails? No    Home free of loose throw rugs in walkways, pet beds, electrical cords, etc? Yes    Adequate lighting in your home to reduce risk of falls? Yes    Life alert? No    Use of a cane, walker or w/c? No    Grab bars in the bathroom? Yes    Shower chair or bench in shower? No    Elevated toilet seat or a handicapped toilet? Yes             TIMED UP AND GO:  Was the test performed?  No    Cognitive Function:    08/03/2017    3:37 PM  MMSE - Mini Mental State Exam  Orientation to time 5  Orientation to Place 5  Registration 3  Attention/ Calculation 5  Recall 3  Language- name 2 objects 2  Language- repeat 1  Language- follow 3 step command 3  Language- read & follow  direction 1  Write a sentence 1  Copy design 1  Total score 30        11/16/2022   10:56 AM 11/12/2021   12:01 PM 09/17/2019    8:22 AM  6CIT Screen  What Year? 0 points 0 points 0 points  What month? 0 points 0 points 0 points  What time? 0 points 0 points 0 points  Count back from 20 0 points 0 points 0 points  Months in reverse 0 points 0 points 0 points  Repeat phrase 0 points 0 points 0 points  Total Score 0 points 0 points 0 points    Immunizations Immunization History  Administered Date(s) Administered  Fluad Quad(high Dose 65+) 02/02/2019   H1N1 04/24/2008   Influenza Whole 02/27/2008, 04/28/2009, 03/01/2011   Influenza, High Dose Seasonal PF 02/06/2013, 02/21/2014, 02/17/2015, 02/10/2017, 03/09/2018   Influenza,inj,Quad PF,6+ Mos 02/16/2016   Influenza-Unspecified 01/16/2012, 01/16/2020, 03/02/2022   PFIZER(Purple Top)SARS-COV-2 Vaccination 05/25/2019, 06/15/2019, 01/27/2020   Pneumococcal Conjugate-13 06/26/2013, 02/21/2014, 03/02/2022   Pneumococcal Polysaccharide-23 02/27/2008, 05/22/2019   Pneumococcal-Unspecified 02/06/2013   Tdap 12/15/2011   Zoster Recombinant(Shingrix) 01/24/2018, 05/22/2018   Zoster, Live 04/28/2009, 05/10/2011, 02/17/2015    TDAP status: Due, Education has been provided regarding the importance of this vaccine. Advised may receive this vaccine at local pharmacy or Health Dept. Aware to provide a copy of the vaccination record if obtained from local pharmacy or Health Dept. Verbalized acceptance and understanding.  Flu Vaccine status: Up to date  Pneumococcal vaccine status: Up to date  Covid-19 vaccine status: Completed vaccines  Qualifies for Shingles Vaccine? Yes   Zostavax completed Yes   Shingrix Completed?: Yes  Screening Tests Health Maintenance  Topic Date Due   DTaP/Tdap/Td (2 - Td or Tdap) 12/14/2021   COVID-19 Vaccine (4 - 2023-24 season) 01/15/2022   INFLUENZA VACCINE  12/16/2022   Medicare Annual Wellness (AWV)   11/16/2023   Pneumonia Vaccine 62+ Years old  Completed   Zoster Vaccines- Shingrix  Completed   HPV VACCINES  Aged Out    Health Maintenance  Health Maintenance Due  Topic Date Due   DTaP/Tdap/Td (2 - Td or Tdap) 12/14/2021   COVID-19 Vaccine (4 - 2023-24 season) 01/15/2022    Colorectal cancer screening: No longer required.   Lung Cancer Screening: (Low Dose CT Chest recommended if Age 13-80 years, 20 pack-year currently smoking OR have quit w/in 15years.) does not qualify.   Lung Cancer Screening Referral: NO  Additional Screening:  Hepatitis C Screening: does not qualify; Completed: NO  Vision Screening: Recommended annual ophthalmology exams for early detection of glaucoma and other disorders of the eye. Is the patient up to date with their annual eye exam?  Yes  Who is the provider or what is the name of the office in which the patient attends annual eye exams? Maris Berger, Md. If pt is not established with a provider, would they like to be referred to a provider to establish care? No .   Dental Screening: Recommended annual dental exams for proper oral hygiene  Diabetic Foot Exam: N/A  Community Resource Referral / Chronic Care Management: CRR required this visit?  No   CCM required this visit?  No     Plan:     I have personally reviewed and noted the following in the patient's chart:   Medical and social history Use of alcohol, tobacco or illicit drugs  Current medications and supplements including opioid prescriptions. Patient is not currently taking opioid prescriptions. Functional ability and status Nutritional status Physical activity Advanced directives List of other physicians Hospitalizations, surgeries, and ER visits in previous 12 months Vitals Screenings to include cognitive, depression, and falls Referrals and appointments  In addition, I have reviewed and discussed with patient certain preventive protocols, quality metrics, and best  practice recommendations. A written personalized care plan for preventive services as well as general preventive health recommendations were provided to patient.     Mickeal Needy, LPN   05/22/1094   After Visit Summary: (Mail) Due to this being a telephonic visit, the after visit summary with patients personalized plan was offered to patient via mail   Nurse Notes: Normal cognitive status assessed  by direct observation via telephone conversation by this Nurse Health Advisor. No abnormalities found.

## 2022-11-16 NOTE — Patient Instructions (Addendum)
[name] [name] , Thank you for taking time to come for your Medicare Wellness Visit. I appreciate your ongoing commitment to your health goals. Please review the following plan we discussed and let me know if I can assist you in the future.   These are the goals we discussed:  Goals      My healthcare goal for 2024 is to maintain my current health status by continuing to eat healthy, stay independent, physically and socially active.        This is a list of the screening recommended for you and due dates:  Health Maintenance  Topic Date Due   DTaP/Tdap/Td vaccine (2 - Td or Tdap) 12/14/2021   COVID-19 Vaccine (4 - 2023-24 season) 01/15/2022   Flu Shot  12/16/2022   Medicare Annual Wellness Visit  11/16/2023   Pneumonia Vaccine  Completed   Zoster (Shingles) Vaccine  Completed   HPV Vaccine  Aged Out    Advanced directives: Yes  Conditions/risks identified: Yes  Next appointment: It was nice speaking with you today!  Please follow up in one year for your annual wellness visit via telephone call with Nurse Percell Miller on 11/21/2023 at 10:15 a.m.  If you need to cancel or reschedule please call [phone].  Preventive Care 49 Years and Older, Male  Preventive care refers to lifestyle choices and visits with your health care provider that can promote health and wellness. What does preventive care include? A yearly physical exam. This is also called an annual well check. Dental exams once or twice a year. Routine eye exams. Ask your health care provider how often you should have your eyes checked. Personal lifestyle choices, including: Daily care of your teeth and gums. Regular physical activity. Eating a healthy diet. Avoiding tobacco and drug use. Limiting alcohol use. Practicing safe sex. Taking low doses of aspirin every day. Taking vitamin and mineral supplements as recommended by your health care provider. What happens during an annual well check? The services and screenings  done by your health care provider during your annual well check will depend on your age, overall health, lifestyle risk factors, and family history of disease. Counseling  Your health care provider may ask you questions about your: Alcohol use. Tobacco use. Drug use. Emotional well-being. Home and relationship well-being. Sexual activity. Eating habits. History of falls. Memory and ability to understand (cognition). Work and work Astronomer. Screening  You may have the following tests or measurements: Height, weight, and BMI. Blood pressure. Lipid and cholesterol levels. These may be checked every 5 years, or more frequently if you are over 69 years old. Skin check. Lung cancer screening. You may have this screening every year starting at age 43 if you have a 30-pack-year history of smoking and currently smoke or have quit within the past 15 years. Fecal occult blood test (FOBT) of the stool. You may have this test every year starting at age 20. Flexible sigmoidoscopy or colonoscopy. You may have a sigmoidoscopy every 5 years or a colonoscopy every 10 years starting at age 68. Prostate cancer screening. Recommendations will vary depending on your family history and other risks. Hepatitis C blood test. Hepatitis B blood test. Sexually transmitted disease (STD) testing. Diabetes screening. This is done by checking your blood sugar (glucose) after you have not eaten for a while (fasting). You may have this done every 1-3 years. Abdominal aortic aneurysm (AAA) screening. You may need this if you are a current or former smoker. Osteoporosis. You may be  screened starting at age 51 if you are at high risk. Talk with your health care provider about your test results, treatment options, and if necessary, the need for more tests. Vaccines  Your health care provider may recommend certain vaccines, such as: Influenza vaccine. This is recommended every year. Tetanus, diphtheria, and acellular  pertussis (Tdap, Td) vaccine. You may need a Td booster every 10 years. Zoster vaccine. You may need this after age 73. Pneumococcal 13-valent conjugate (PCV13) vaccine. One dose is recommended after age 69. Pneumococcal polysaccharide (PPSV23) vaccine. One dose is recommended after age 24. Talk to your health care provider about which screenings and vaccines you need and how often you need them. This information is not intended to replace advice given to you by your health care provider. Make sure you discuss any questions you have with your health care provider. Document Released: 05/30/2015 Document Revised: 01/21/2016 Document Reviewed: 03/04/2015 Elsevier Interactive Patient Education  2017 [employer] Prevention in the Home Falls can cause injuries. They can happen to people of all ages. There are many things you can do to make your home safe and to help prevent falls. What can I do on the outside of my home? Regularly fix the edges of walkways and driveways and fix any cracks. Remove anything that might make you trip as you walk through a door, such as a raised step or threshold. Trim any bushes or trees on the path to your home. Use bright outdoor lighting. Clear any walking paths of anything that might make someone trip, such as rocks or tools. Regularly check to see if handrails are loose or broken. Make sure that both sides of any steps have handrails. Any raised decks and porches should have guardrails on the edges. Have any leaves, snow, or ice cleared regularly. Use sand or salt on walking paths during winter. Clean up any spills in your garage right away. This includes oil or grease spills. What can I do in the bathroom? Use night lights. Install grab bars by the toilet and in the tub and shower. Do not use towel bars as grab bars. Use non-skid mats or decals in the tub or shower. If you need to sit down in the shower, use a plastic, non-slip stool. Keep the floor  dry. Clean up any water that spills on the floor as soon as it happens. Remove soap buildup in the tub or shower regularly. Attach bath mats securely with double-sided non-slip rug tape. Do not have throw rugs and other things on the floor that can make you trip. What can I do in the bedroom? Use night lights. Make sure that you have a light by your bed that is easy to reach. Do not use any sheets or blankets that are too big for your bed. They should not hang down onto the floor. Have a firm chair that has side arms. You can use this for support while you get dressed. Do not have throw rugs and other things on the floor that can make you trip. What can I do in the kitchen? Clean up any spills right away. Avoid walking on wet floors. Keep items that you use a lot in easy-to-reach places. If you need to reach something above you, use a strong step stool that has a grab bar. Keep electrical cords out of the way. Do not use floor polish or wax that makes floors slippery. If you must use wax, use non-skid floor wax. Do not have  throw rugs and other things on the floor that can make you trip. What can I do with my stairs? Do not leave any items on the stairs. Make sure that there are handrails on both sides of the stairs and use them. Fix handrails that are broken or loose. Make sure that handrails are as long as the stairways. Check any carpeting to make sure that it is firmly attached to the stairs. Fix any carpet that is loose or worn. Avoid having throw rugs at the top or bottom of the stairs. If you do have throw rugs, attach them to the floor with carpet tape. Make sure that you have a light switch at the top of the stairs and the bottom of the stairs. If you do not have them, ask someone to add them for you. What else can I do to help prevent falls? Wear shoes that: Do not have high heels. Have rubber bottoms. Are comfortable and fit you well. Are closed at the toe. Do not wear  sandals. If you use a stepladder: Make sure that it is fully opened. Do not climb a closed stepladder. Make sure that both sides of the stepladder are locked into place. Ask someone to hold it for you, if possible. Clearly mark and make sure that you can see: Any grab bars or handrails. First and last steps. Where the edge of each step is. Use tools that help you move around (mobility aids) if they are needed. These include: Canes. Walkers. Scooters. Crutches. Turn on the lights when you go into a dark area. Replace any light bulbs as soon as they burn out. Set up your furniture so you have a clear path. Avoid moving your furniture around. If any of your floors are uneven, fix them. If there are any pets around you, be aware of where they are. Review your medicines with your doctor. Some medicines can make you feel dizzy. This can increase your chance of falling. Ask your doctor what other things that you can do to help prevent falls. This information is not intended to replace advice given to you by your health care provider. Make sure you discuss any questions you have with your health care provider. Document Released: 02/27/2009 Document Revised: 10/09/2015 Document Reviewed: 06/07/2014 Elsevier Interactive Patient Education  2017 [employer].

## 2022-11-23 DIAGNOSIS — H0012 Chalazion right lower eyelid: Secondary | ICD-10-CM | POA: Diagnosis not present

## 2022-12-02 ENCOUNTER — Ambulatory Visit (INDEPENDENT_AMBULATORY_CARE_PROVIDER_SITE_OTHER): Payer: Medicare Other | Admitting: Internal Medicine

## 2022-12-02 ENCOUNTER — Encounter: Payer: Self-pay | Admitting: Internal Medicine

## 2022-12-02 VITALS — BP 132/72 | HR 74 | Temp 98.0°F | Ht 70.0 in | Wt 144.0 lb

## 2022-12-02 DIAGNOSIS — K219 Gastro-esophageal reflux disease without esophagitis: Secondary | ICD-10-CM | POA: Diagnosis not present

## 2022-12-02 DIAGNOSIS — F411 Generalized anxiety disorder: Secondary | ICD-10-CM | POA: Diagnosis not present

## 2022-12-02 DIAGNOSIS — R351 Nocturia: Secondary | ICD-10-CM

## 2022-12-02 DIAGNOSIS — N401 Enlarged prostate with lower urinary tract symptoms: Secondary | ICD-10-CM

## 2022-12-02 DIAGNOSIS — I10 Essential (primary) hypertension: Secondary | ICD-10-CM

## 2022-12-02 DIAGNOSIS — Z23 Encounter for immunization: Secondary | ICD-10-CM

## 2022-12-02 LAB — BASIC METABOLIC PANEL
BUN: 16 mg/dL (ref 6–23)
CO2: 26 mEq/L (ref 19–32)
Calcium: 9.6 mg/dL (ref 8.4–10.5)
Chloride: 108 mEq/L (ref 96–112)
Creatinine, Ser: 1.1 mg/dL (ref 0.40–1.50)
GFR: 60.34 mL/min (ref >60.00)
Glucose, Bld: 96 mg/dL (ref 70–99)
Potassium: 4.4 mEq/L (ref 3.5–5.1)
Sodium: 140 mEq/L (ref 135–145)

## 2022-12-02 LAB — CBC WITH DIFFERENTIAL/PLATELET
Basophils Absolute: 0 10*3/uL (ref 0.0–0.1)
Basophils Relative: 0.6 % (ref 0.0–3.0)
Eosinophils Absolute: 0.2 10*3/uL (ref 0.0–0.7)
Eosinophils Relative: 3.7 % (ref 0.0–5.0)
HCT: 39 % (ref 39.0–52.0)
Hemoglobin: 13.1 g/dL (ref 13.0–17.0)
Lymphocytes Relative: 28.7 % (ref 12.0–46.0)
Lymphs Abs: 1.4 10*3/uL (ref 0.7–4.0)
MCHC: 33.5 g/dL (ref 30.0–36.0)
MCV: 91.3 fl (ref 78.0–100.0)
Monocytes Absolute: 0.5 10*3/uL (ref 0.1–1.0)
Monocytes Relative: 9.8 % (ref 3.0–12.0)
Neutro Abs: 2.7 10*3/uL (ref 1.4–7.7)
Neutrophils Relative %: 57.2 % (ref 43.0–77.0)
Platelets: 168 10*3/uL (ref 150.0–400.0)
RBC: 4.26 Mil/uL (ref 4.22–5.81)
RDW: 13.4 % (ref 11.5–15.5)
WBC: 4.7 10*3/uL (ref 4.0–10.5)

## 2022-12-02 LAB — PSA: PSA: 1.1 ng/mL (ref 0.10–4.00)

## 2022-12-02 LAB — TSH: TSH: 3.83 u[IU]/mL (ref 0.35–5.50)

## 2022-12-02 MED ORDER — BOOSTRIX 5-2.5-18.5 LF-MCG/0.5 IM SUSP: 0.5000 mL | Freq: Once | INTRAMUSCULAR | 0 refills | Status: AC

## 2022-12-02 NOTE — Patient Instructions (Signed)
Gastroesophageal Reflux Disease, Adult Gastroesophageal reflux (GER) happens when acid from the stomach flows up into the tube that connects the mouth and the stomach (esophagus). Normally, food travels down the esophagus and stays in the stomach to be digested. However, when a person has GER, food and stomach acid sometimes move back up into the esophagus. If this becomes a more serious problem, the person may be diagnosed with a disease called gastroesophageal reflux disease (GERD). GERD occurs when the reflux: Happens often. Causes frequent or severe symptoms. Causes problems such as damage to the esophagus. When stomach acid comes in contact with the esophagus, the acid may cause inflammation in the esophagus. Over time, GERD may create small holes (ulcers) in the lining of the esophagus. What are the causes? This condition is caused by a problem with the muscle between the esophagus and the stomach (lower esophageal sphincter, or LES). Normally, the LES muscle closes after food passes through the esophagus to the stomach. When the LES is weakened or abnormal, it does not close properly, and that allows food and stomach acid to go back up into the esophagus. The LES can be weakened by certain dietary substances, medicines, and medical conditions, including: Tobacco use. Pregnancy. Having a hiatal hernia. Alcohol use. Certain foods and beverages, such as coffee, chocolate, onions, and peppermint. What increases the risk? You are more likely to develop this condition if you: Have an increased body weight. Have a connective tissue disorder. Take NSAIDs, such as ibuprofen. What are the signs or symptoms? Symptoms of this condition include: Heartburn. Difficult or painful swallowing and the feeling of having a lump in the throat. A bitter taste in the mouth. Bad breath and having a large amount of saliva. Having an upset or bloated stomach and belching. Chest pain. Different conditions can  cause chest pain. Make sure you see your health care provider if you experience chest pain. Shortness of breath or wheezing. Ongoing (chronic) cough or a nighttime cough. Wearing away of tooth enamel. Weight loss. How is this diagnosed? This condition may be diagnosed based on a medical history and a physical exam. To determine if you have mild or severe GERD, your health care provider may also monitor how you respond to treatment. You may also have tests, including: A test to examine your stomach and esophagus with a small camera (endoscopy). A test that measures the acidity level in your esophagus. A test that measures how much pressure is on your esophagus. A barium swallow or modified barium swallow test to show the shape, size, and functioning of your esophagus. How is this treated? Treatment for this condition may vary depending on how severe your symptoms are. Your health care provider may recommend: Changes to your diet. Medicine. Surgery. The goal of treatment is to help relieve your symptoms and to prevent complications. Follow these instructions at home: Eating and drinking  Follow a diet as recommended by your health care provider. This may involve avoiding foods and drinks such as: Coffee and tea, with or without caffeine. Drinks that contain alcohol. Energy drinks and sports drinks. Carbonated drinks or sodas. Chocolate and cocoa. Peppermint and mint flavorings. Garlic and onions. Horseradish. Spicy and acidic foods, including peppers, chili powder, curry powder, vinegar, hot sauces, and barbecue sauce. Citrus fruit juices and citrus fruits, such as oranges, lemons, and limes. Tomato-based foods, such as red sauce, chili, salsa, and pizza with red sauce. Fried and fatty foods, such as donuts, french fries, potato chips, and high-fat dressings.   High-fat meats, such as hot dogs and fatty cuts of red and white meats, such as rib eye steak, sausage, ham, and  bacon. High-fat dairy items, such as whole milk, butter, and cream cheese. Eat small, frequent meals instead of large meals. Avoid drinking large amounts of liquid with your meals. Avoid eating meals during the 2-3 hours before bedtime. Avoid lying down right after you eat. Do not exercise right after you eat. Lifestyle  Do not use any products that contain nicotine or tobacco. These products include cigarettes, chewing tobacco, and vaping devices, such as e-cigarettes. If you need help quitting, ask your health care provider. Try to reduce your stress by using methods such as yoga or meditation. If you need help reducing stress, ask your health care provider. If you are overweight, reduce your weight to an amount that is healthy for you. Ask your health care provider for guidance about a safe weight loss goal. General instructions Pay attention to any changes in your symptoms. Take over-the-counter and prescription medicines only as told by your health care provider. Do not take aspirin, ibuprofen, or other NSAIDs unless your health care provider told you to take these medicines. Wear loose-fitting clothing. Do not wear anything tight around your waist that causes pressure on your abdomen. Raise (elevate) the head of your bed about 6 inches (15 cm). You can use a wedge to do this. Avoid bending over if this makes your symptoms worse. Keep all follow-up visits. This is important. Contact a health care provider if: You have: New symptoms. Unexplained weight loss. Difficulty swallowing or it hurts to swallow. Wheezing or a persistent cough. A hoarse voice. Your symptoms do not improve with treatment. Get help right away if: You have sudden pain in your arms, neck, jaw, teeth, or back. You suddenly feel sweaty, dizzy, or light-headed. You have chest pain or shortness of breath. You vomit and the vomit is green, yellow, or black, or it looks like blood or coffee grounds. You faint. You  have stool that is red, bloody, or black. You cannot swallow, drink, or eat. These symptoms may represent a serious problem that is an emergency. Do not wait to see if the symptoms will go away. Get medical help right away. Call your local emergency services (911 in the [place] Do not drive yourself to the hospital. Summary Gastroesophageal reflux happens when acid from the stomach flows up into the esophagus. GERD is a disease in which the reflux happens often, causes frequent or severe symptoms, or causes problems such as damage to the esophagus. Treatment for this condition may vary depending on how severe your symptoms are. Your health care provider may recommend diet and lifestyle changes, medicine, or surgery. Contact a health care provider if you have new or worsening symptoms. Take over-the-counter and prescription medicines only as told by your health care provider. Do not take aspirin, ibuprofen, or other NSAIDs unless your health care provider told you to do so. Keep all follow-up visits as told by your health care provider. This is important. This information is not intended to replace advice given to you by your health care provider. Make sure you discuss any questions you have with your health care provider. Document Revised: 11/12/2019 Document Reviewed: 11/12/2019 Elsevier Patient Education  2024 Else[employer] Inc[employer]  

## 2022-12-02 NOTE — Progress Notes (Unsigned)
Subjective:  Patient ID: [name] [name], male    DOB: 1933-02-28  Age: 89 y.o. MRN: [id number]  CC: Gastroesophageal Reflux   HPI MCCABE GLORIA presents for f/up ----  Discussed the use of AI scribe software for clinical note transcription with the patient, who gave verbal consent to proceed.  History of Present Illness   The patient, who maintains an active lifestyle with regular walking, reports no associated dyspnea, joint issues, or chest pain. However, they have recently experienced an increase in heartburn episodes, from once a month to two or three times a month. The patient manages these episodes with Gaviscon and by propping themselves up in bed, and has not found it necessary to take a proton pump inhibitor such as Nexium. They deny any dysphagia or weight loss associated with these symptoms. The patient also notes a recent change in alcohol tolerance, with the second gin and tonic having a more pronounced effect than previously. Despite these minor issues, the patient maintains a positive attitude and continues to socialize and work.       Outpatient Medications Prior to Visit  Medication Sig Dispense Refill   ALPRAZolam (XANAX) 0.25 MG tablet Take 1 tablet (0.25 mg total) by mouth 2 (two) times daily as needed for anxiety. 60 tablet 3   aspirin EC 81 MG tablet Take 1 tablet (81 mg total) by mouth daily. Swallow whole. 30 tablet 5   Cholecalciferol (VITAMIN D3) 50 MCG (2000 UT) capsule Take 2,000 Units by mouth daily.     clopidogrel (PLAVIX) 75 MG tablet Take 1 tablet (75 mg total) by mouth daily. 90 tablet 3   diazepam (VALIUM) 5 MG tablet Take 5 mg by mouth at bedtime.     lisinopril (ZESTRIL) 10 MG tablet Take 1 tablet (10 mg total) by mouth daily. 90 tablet 3   metoprolol succinate (TOPROL-XL) 50 MG 24 hr tablet Take 1 tablet (50 mg total) by mouth daily. 90 tablet 3   nitroGLYCERIN (NITROSTAT) 0.4 MG SL tablet Place 1 tablet (0.4 mg total) under the tongue every 5 (five)  minutes as needed for chest pain. 100 tablet 3   Polyethyl Glycol-Propyl Glycol (SYSTANE ULTRA) 0.4-0.3 % SOLN Place 1 drop into both eyes 2 (two) times daily.     psyllium (METAMUCIL) 58.6 % packet Take 1 packet by mouth daily.     rosuvastatin (CRESTOR) 40 MG tablet TAKE 1 TABLET DAILY 90 tablet 3   tamsulosin (FLOMAX) 0.4 MG CAPS capsule Take 0.8 mg by mouth every morning.     traZODone (DESYREL) 50 MG tablet TAKE 1/2 TO 1 TABLET BY MOUTH DAILY AT BEDTIME 30 tablet 1   zolpidem (AMBIEN) 10 MG tablet Take 1 tablet (10 mg total) by mouth at bedtime as needed for sleep. 90 tablet 0   No facility-administered medications prior to visit.    ROS Review of Systems  Objective:  BP 132/72 (BP Location: Left Arm, Patient Position: Sitting, Cuff Size: Large)   Pulse 74   Temp 98 F (36.7 C) (Oral)   Ht 5\' 10"  (1.778 m)   Wt 144 lb (65.3 kg)   SpO2 94%   BMI 20.66 kg/m   BP Readings from Last 3 Encounters:  12/02/22 132/72  11/16/22 132/62  11/15/22 132/62    Wt Readings from Last 3 Encounters:  12/02/22 144 lb (65.3 kg)  11/16/22 143 lb (64.9 kg)  11/15/22 143 lb (64.9 kg)    Physical Exam  Lab Results  Component Value  Date   WBC 4.7 12/02/2022   HGB 13.1 12/02/2022   HCT 39.0 12/02/2022   PLT 168.0 12/02/2022   GLUCOSE 96 12/02/2022   CHOL 137 07/02/2022   TRIG 141 07/02/2022   HDL 62 07/02/2022   LDLDIRECT 132.2 03/10/2007   LDLCALC 51 07/02/2022   ALT 17 07/02/2022   AST 21 07/02/2022   NA 140 12/02/2022   K 4.4 12/02/2022   CL 108 12/02/2022   CREATININE 1.10 12/02/2022   BUN 16 12/02/2022   CO2 26 12/02/2022   TSH 3.83 12/02/2022   PSA 1.10 12/02/2022   INR 1.0 04/27/2012    CARDIAC CATHETERIZATION  Result Date: 06/15/2022   Dist LAD lesion is 40% stenosed.  Eccentric lesion occurs at a bifurcation with the last significant diagonal branch.   Prox LAD lesion is 10% stenosed.   RPAV lesion is 75% stenosed.   A drug-eluting stent was successfully placed  using a SYNERGY XD 2.75X24 mm, postdilated to greater than 3.25 mm.   Post intervention, there is a 0% residual stenosis.   The left ventricular systolic function is normal.   LV end diastolic pressure is normal.   The left ventricular ejection fraction is 55-65% by visual estimate.   There is no aortic valve stenosis. Successful PCI of the posterolateral artery.  Initially, the stenosis looked longer but there was partial resolution with intracoronary nitroglycerin.  We were able to use a shorter stent than initially planned.  Of note, he also had some vasospasm in the right radial artery.  If he had further anginal symptoms, could consider isosorbide. Plan for same-day discharge.  Results conveyed to Alba Cory, [phone].    Assessment & Plan:  Essential hypertension -     Basic metabolic panel; Future -     CBC with Differential/Platelet; Future  Gastroesophageal reflux disease without esophagitis -     CBC with Differential/Platelet; Future  GAD (generalized anxiety disorder) -     TSH; Future  BPH associated with nocturia -     PSA; Future  Need for prophylactic vaccination with combined diphtheria-tetanus-pertussis (DTP) vaccine -     Boostrix; Inject 0.5 mLs into the muscle once for 1 dose.  Dispense: 0.5 mL; Refill: 0     Follow-up: Return in about 6 months (around 06/04/2023).  Sanda Linger, MD

## 2022-12-03 ENCOUNTER — Encounter: Payer: Self-pay | Admitting: Internal Medicine

## 2022-12-07 DIAGNOSIS — D485 Neoplasm of uncertain behavior of skin: Secondary | ICD-10-CM | POA: Diagnosis not present

## 2022-12-07 DIAGNOSIS — H01005 Unspecified blepharitis left lower eyelid: Secondary | ICD-10-CM | POA: Diagnosis not present

## 2022-12-07 DIAGNOSIS — Z85828 Personal history of other malignant neoplasm of skin: Secondary | ICD-10-CM | POA: Diagnosis not present

## 2022-12-07 DIAGNOSIS — C44729 Squamous cell carcinoma of skin of left lower limb, including hip: Secondary | ICD-10-CM | POA: Diagnosis not present

## 2022-12-07 DIAGNOSIS — H0012 Chalazion right lower eyelid: Secondary | ICD-10-CM | POA: Diagnosis not present

## 2022-12-07 DIAGNOSIS — Z85820 Personal history of malignant melanoma of skin: Secondary | ICD-10-CM | POA: Diagnosis not present

## 2022-12-07 DIAGNOSIS — L570 Actinic keratosis: Secondary | ICD-10-CM | POA: Diagnosis not present

## 2022-12-07 DIAGNOSIS — H01002 Unspecified blepharitis right lower eyelid: Secondary | ICD-10-CM | POA: Diagnosis not present

## 2022-12-09 ENCOUNTER — Other Ambulatory Visit: Payer: Self-pay

## 2022-12-09 ENCOUNTER — Ambulatory Visit (INDEPENDENT_AMBULATORY_CARE_PROVIDER_SITE_OTHER): Payer: Medicare Other

## 2022-12-09 ENCOUNTER — Encounter: Payer: Self-pay | Admitting: Family Medicine

## 2022-12-09 ENCOUNTER — Ambulatory Visit (INDEPENDENT_AMBULATORY_CARE_PROVIDER_SITE_OTHER): Payer: Medicare Other | Admitting: Family Medicine

## 2022-12-09 VITALS — BP 122/70 | HR 80 | Ht 70.0 in | Wt 147.0 lb

## 2022-12-09 DIAGNOSIS — M25561 Pain in right knee: Secondary | ICD-10-CM

## 2022-12-09 DIAGNOSIS — M1711 Unilateral primary osteoarthritis, right knee: Secondary | ICD-10-CM

## 2022-12-09 NOTE — Assessment & Plan Note (Signed)
Has some arthritis noted of the knee but very mild overall especially without active patient is in his age.  We discussed with patient some VMO strengthening. To avoid any activity, do not think brace is necessary at this time.  Follow-up again in 6 to 8 weeks otherwise.  Worsening pain will consider injection but do not think it is necessary today.

## 2022-12-09 NOTE — Progress Notes (Signed)
[facility] [address] [place] [address] Phone: [phone] Subjective:   [name] [name], am serving as a scribe for Dr. Antoine Primas.  I'm seeing this patient by the request  of:  Etta Grandchild, MD  CC: Right knee pain  [id number]  03/15/2022 I believe patient should do relatively well.  Would like to get a 12 weeks until we see any other significant treatment.  I would like patient to come back in in 6 to 8 weeks to see how patient is responding.     Updated 12/09/2022 [name] [name] is a 89 y.o. male coming in with complaint of knee pain. R knee stiff when sitting for a while. Takes a couple a steps and okay. Not pain discomfort. Feels a little weak. Nothing that stopping him from activity at the moment.      Past Medical History:  Diagnosis Date   Anxiety    CAD (coronary artery disease)    a. s/p stent to LAD 2008;  b. abnormal ETT 11/13 with VTach => LHC pLAD 30% ISR, mCFX 30%, dRCA 60-75% which had progressed from previous study in 2010 => FFR of RCA 0.92 => med Rx    Carotid stenosis    dopplers 1/14:  0-39% bilateral ICA stenosis   Colon polyps    Colon polyps    adenomatous   Diverticular disease of colon    Hemorrhoids    external and internal   History of BPH    HTN (hypertension)    Hx of echocardiogram    a. Echo 9/11:  EF 55-60%, normal motion, grade 2 diastolic dysfunction, mild MR, mild LAE, mild RAE, PASP 33.   Hyperlipidemia    IBS (irritable bowel syndrome)    Rhinitis    Stroke, lacunar (HCC) 08/24/2012   Old, 2011 head mri   Past Surgical History:  Procedure Laterality Date   APPENDECTOMY  1956   CORONARY STENT INTERVENTION N/A 06/15/2022   Procedure: CORONARY STENT INTERVENTION;  Surgeon: Corky Crafts, MD;  Location: [facility] INVASIVE CV LAB;  Service: Cardiovascular;  Laterality: N/A;   CORONARY STENT PLACEMENT  2008    Successful PCI of the lesion in the proximal LAD using a Promus    HERNIA  REPAIR Right 2005   LEFT HEART CATH AND CORONARY ANGIOGRAPHY N/A 06/15/2022   Procedure: LEFT HEART CATH AND CORONARY ANGIOGRAPHY;  Surgeon: Corky Crafts, MD;  Location: [facility] INVASIVE CV LAB;  Service: Cardiovascular;  Laterality: N/A;   NASAL SINUS SURGERY  1975   PERCUTANEOUS CORONARY STENT INTERVENTION (PCI-S) N/A 05/03/2012   Procedure: PERCUTANEOUS CORONARY STENT INTERVENTION (PCI-S);  Surgeon: Tonny Bollman, MD;  Location: [facility] CATH LAB;  Service: Cardiovascular;  Laterality: N/A;   Promus DES ok for 3T MRI  2008   Social History   Socioeconomic History   Marital status: Married    Spouse name: Not on file   Number of children: 3   Years of education: 16   Highest education level: Not on file  Occupational History   Occupation: businessman, Financial planner: RETIRED  Tobacco Use   Smoking status: Never    Passive exposure: Never   Smokeless tobacco: Never  Vaping Use   Vaping status: Never Used  Substance and Sexual Activity   Alcohol use: Not Currently    Comment: 4-6 drinks/week   Drug use: No   Sexual activity: Yes    Partners: Female  Other  Topics Concern   Not on file  Social History Narrative   HSG, Engineer, 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 He has two daughters, 1 in [place] 1 in [place] (Dec '12) and a son who works with him. Several grandchildren.Reports that he spends a good deal of time at his home in [place] which he greatly enjoys and his home in the [place]. Enjoys driving his BMW convertible when in the mountains.      ACP - Yes CPR, yes for short-term mechanical ventilation for reversible disease. Recommended [email or web address] for consideration.   Social Determinants of Health   Financial Resource Strain: Low Risk  (11/16/2022)   Overall Financial Resource Strain (CARDIA)    Difficulty of Paying Living Expenses: Not hard at all   Food Insecurity: No Food Insecurity (11/16/2022)   Hunger Vital Sign    Worried About Running Out of Food in the Last Year: Never true    Ran Out of Food in the Last Year: Never true  Transportation Needs: No Transportation Needs (11/16/2022)   PRAPARE - Administrator, Civil Service (Medical): No    Lack of Transportation (Non-Medical): No  Physical Activity: Sufficiently Active (11/16/2022)   Exercise Vital Sign    Days of Exercise per Week: 6 days    Minutes of Exercise per Session: 30 min  Stress: No Stress Concern Present (11/16/2022)   [name] of Occupational Health - Occupational Stress Questionnaire    Feeling of Stress : Not at all  Social Connections: Unknown (11/16/2022)   Social Connection and Isolation Panel [NHANES]    Frequency of Communication with Friends and Family: More than three times a week    Frequency of Social Gatherings with Friends and Family: Once a week    Attends Religious Services: Not on Marketing executive or Organizations: No    Attends Engineer, structural: 1 to 4 times per year    Marital Status: Married   No Known Allergies Family History  Problem Relation Age of Onset   Coronary artery disease Father    Heart attack Father    Rheum arthritis Mother    Cancer Brother        ? TYPE   Colon cancer Neg Hx    Stomach cancer Neg Hx    Pancreatic cancer Neg Hx      Current Outpatient Medications (Cardiovascular):    lisinopril (ZESTRIL) 10 MG tablet, Take 1 tablet (10 mg total) by mouth daily.   metoprolol succinate (TOPROL-XL) 50 MG 24 hr tablet, Take 1 tablet (50 mg total) by mouth daily.   nitroGLYCERIN (NITROSTAT) 0.4 MG SL tablet, Place 1 tablet (0.4 mg total) under the tongue every 5 (five) minutes as needed for chest pain.   rosuvastatin (CRESTOR) 40 MG tablet, TAKE 1 TABLET DAILY   Current Outpatient Medications (Analgesics):    aspirin EC 81 MG tablet, Take 1 tablet (81 mg total) by mouth daily.  Swallow whole.  Current Outpatient Medications (Hematological):    clopidogrel (PLAVIX) 75 MG tablet, Take 1 tablet (75 mg total) by mouth daily.  Current Outpatient Medications (Other):    ALPRAZolam (XANAX) 0.25 MG tablet, Take 1 tablet (0.25 mg total) by mouth 2 (two) times daily as needed for anxiety.   Cholecalciferol (VITAMIN D3) 50 MCG (2000 UT) capsule, Take 2,000 Units by mouth daily.   diazepam (VALIUM) 5 MG tablet,  Take 5 mg by mouth at bedtime.   Polyethyl Glycol-Propyl Glycol (SYSTANE ULTRA) 0.4-0.3 % SOLN, Place 1 drop into both eyes 2 (two) times daily.   psyllium (METAMUCIL) 58.6 % packet, Take 1 packet by mouth daily.   tamsulosin (FLOMAX) 0.4 MG CAPS capsule, Take 0.8 mg by mouth every morning.   traZODone (DESYREL) 50 MG tablet, TAKE 1/2 TO 1 TABLET BY MOUTH DAILY AT BEDTIME   zolpidem (AMBIEN) 10 MG tablet, Take 1 tablet (10 mg total) by mouth at bedtime as needed for sleep.   Reviewed prior external information including notes and imaging from  primary care provider As well as notes that were available from care everywhere and other healthcare systems.  Past medical history, social, surgical and family history all reviewed in electronic medical record.  No pertanent information unless stated regarding to the chief complaint.   Review of Systems:  No headache, visual changes, nausea, vomiting, diarrhea, constipation, dizziness, abdominal pain, skin rash, fevers, chills, night sweats, weight loss, swollen lymph nodes, body aches, joint swelling, chest pain, shortness of breath, mood changes. POSITIVE muscle aches  Objective  Blood pressure 122/70, pulse 80, height 5\' 10"  (1.778 m), weight 147 lb (66.7 kg), SpO2 97%.   General: No apparent distress alert and oriented x3 mood and affect normal, dressed appropriately.  HEENT: Pupils equal, extraocular movements intact  Respiratory: Patient's speak in full sentences and does not appear short of breath  Cardiovascular: No  lower extremity edema, non tender, no erythema  Right knee does have some very mild crepitus, mild lateral tracking of the patella noted.  No significant instability with valgus and varus force.  Minimal discomfort over the patellofemoral and the medial joint space.  Limited muscular skeletal ultrasound was performed and interpreted by Antoine Primas, M  Limited ultrasound of patient's right knee shows very mild hypoechoic changes of the patellofemoral joint.  Mild narrowing of the patellofemoral joint.  Medial and lateral joint space appears to be unremarkable. Impression: Mild patellofemoral arthritis    Impression and Recommendations:     The above documentation has been reviewed and is accurate and complete Judi Saa, DO

## 2022-12-09 NOTE — Patient Instructions (Signed)
Do prescribed exercises at least 3x a week Xrays today See you again in 6-8 weeks

## 2023-01-18 DIAGNOSIS — L814 Other melanin hyperpigmentation: Secondary | ICD-10-CM | POA: Diagnosis not present

## 2023-01-18 DIAGNOSIS — L570 Actinic keratosis: Secondary | ICD-10-CM | POA: Diagnosis not present

## 2023-01-18 DIAGNOSIS — Z85828 Personal history of other malignant neoplasm of skin: Secondary | ICD-10-CM | POA: Diagnosis not present

## 2023-01-18 DIAGNOSIS — L821 Other seborrheic keratosis: Secondary | ICD-10-CM | POA: Diagnosis not present

## 2023-01-18 DIAGNOSIS — D692 Other nonthrombocytopenic purpura: Secondary | ICD-10-CM | POA: Diagnosis not present

## 2023-01-18 DIAGNOSIS — Z85820 Personal history of malignant melanoma of skin: Secondary | ICD-10-CM | POA: Diagnosis not present

## 2023-01-18 DIAGNOSIS — D1801 Hemangioma of skin and subcutaneous tissue: Secondary | ICD-10-CM | POA: Diagnosis not present

## 2023-01-18 DIAGNOSIS — C44619 Basal cell carcinoma of skin of left upper limb, including shoulder: Secondary | ICD-10-CM | POA: Diagnosis not present

## 2023-01-24 ENCOUNTER — Ambulatory Visit: Payer: Medicare Other | Admitting: Family Medicine

## 2023-02-02 ENCOUNTER — Telehealth: Payer: Self-pay | Admitting: Internal Medicine

## 2023-02-02 NOTE — Telephone Encounter (Signed)
Error

## 2023-02-02 NOTE — Telephone Encounter (Signed)
Ask him to come in

## 2023-02-02 NOTE — Telephone Encounter (Signed)
Pt states he need medication for covid 19 symptoms not sure if can get something over the counter, prescribed or need to schedule a appointment. Pt mentioned could the doctor fit him in if possible due to his schedule being filled.Marland Kitchen

## 2023-02-03 ENCOUNTER — Telehealth (INDEPENDENT_AMBULATORY_CARE_PROVIDER_SITE_OTHER): Payer: Medicare Other | Admitting: Nurse Practitioner

## 2023-02-03 DIAGNOSIS — U071 COVID-19: Secondary | ICD-10-CM | POA: Diagnosis not present

## 2023-02-03 MED ORDER — MOLNUPIRAVIR EUA 200MG CAPSULE: 4.0000 | ORAL_CAPSULE | Freq: Two times a day (BID) | ORAL | 0 refills | Status: AC

## 2023-02-03 NOTE — Assessment & Plan Note (Signed)
Acute Not sure if etiology is side effect from recent vaccine or if this is a true COVID infection.  However based on patient's age and comorbidities he is at risk for severe illness so we will treat him as if he truly is COVID-positive. Due to him being on Plavix, alprazolam, diazepam, rosuvastatin, and zolpidem would recommend against treatment with Paxlovid.  Will prescribe molnupiravir. We discussed signs and symptoms of severe illness and what to do these were to occur.  He reports his understanding.

## 2023-02-03 NOTE — Progress Notes (Signed)
Established Patient Office Visit  An audio/visual tele-health visit was completed today for this patient. I connected with  [name] [name] on 02/03/23 utilizing audio/visual technology and verified that I am speaking with the correct person using two identifiers. The patient was located at their home, and I was located at the office of [facility] during the encounter. I discussed the limitations of evaluation and management by telemedicine. The patient expressed understanding and agreed to proceed.     Subjective   Patient ID: [name] [name], male    DOB: 1933/04/10  Age: 89 y.o. MRN: [id number]  Chief Complaint  Patient presents with   Covid Positive    Was positive this morning, tired from it, OTC tylenol     Symptom onset 4 days ago.  Reports had some cough which has mostly resolved.  Also feeling fatigued.  Took a COVID-19 test at home today and it was positive.  Reports he did have COVID-19 vaccine administered last week.  Has had COVID twice before, both times disease course was mild.  Patient is 89 years of age and has risk factors for severe illness including hypertension and coronary artery disease.    Review of Systems  Constitutional:  Positive for malaise/fatigue. Negative for chills and fever.  Respiratory:  Negative for cough, shortness of breath and wheezing.   Cardiovascular:  Negative for chest pain.  Musculoskeletal:  Negative for myalgias.  Neurological:  Negative for headaches.      Objective:     There were no vitals taken for this visit.   Physical Exam Comprehensive physical exam not completed today as office visit was conducted remotely.  Patient appears well over video, no signs of acute distress.  Patient was alert and oriented, and appeared to have appropriate judgment.   No results found for any visits on 02/03/23.    The ASCVD Risk score (Arnett DK, et al., 2019) failed to calculate for the following reasons:   The  2019 ASCVD risk score is only valid for ages 58 to 77   The patient has a prior MI or stroke diagnosis    Assessment & Plan:   Problem List Items Addressed This Visit       Other   COVID-19 virus infection - Primary    Acute Not sure if etiology is side effect from recent vaccine or if this is a true COVID infection.  However based on patient's age and comorbidities he is at risk for severe illness so we will treat him as if he truly is COVID-positive. Due to him being on Plavix, alprazolam, diazepam, rosuvastatin, and zolpidem would recommend against treatment with Paxlovid.  Will prescribe molnupiravir. We discussed signs and symptoms of severe illness and what to do these were to occur.  He reports his understanding.      Relevant Medications   molnupiravir EUA (LAGEVRIO) 200 mg CAPS capsule    Return if symptoms worsen or fail to improve.    Elenore Paddy, NP

## 2023-02-08 DIAGNOSIS — H5000 Unspecified esotropia: Secondary | ICD-10-CM | POA: Diagnosis not present

## 2023-02-08 DIAGNOSIS — H52203 Unspecified astigmatism, bilateral: Secondary | ICD-10-CM | POA: Diagnosis not present

## 2023-02-08 DIAGNOSIS — H2512 Age-related nuclear cataract, left eye: Secondary | ICD-10-CM | POA: Diagnosis not present

## 2023-02-22 ENCOUNTER — Other Ambulatory Visit: Payer: Self-pay | Admitting: Internal Medicine

## 2023-02-22 DIAGNOSIS — F5104 Psychophysiologic insomnia: Secondary | ICD-10-CM

## 2023-03-01 ENCOUNTER — Encounter: Payer: Self-pay | Admitting: Internal Medicine

## 2023-03-04 NOTE — Progress Notes (Unsigned)
[facility] [address] [place] [address] Phone: [phone] Subjective:   [name] [name], am serving as a scribe for Dr. Antoine Primas.  I'm seeing this patient by the request  of:  Etta Grandchild, MD  CC: Right shoulder pain  [id number]  12/09/2022 Has some arthritis noted of the knee but very mild overall especially without active patient is in his age.  We discussed with patient some VMO strengthening. To avoid any activity, do not think brace is necessary at this time.  Follow-up again in 6 to 8 weeks otherwise.  Worsening pain will consider injection but do not think it is necessary today.  Updated 03/07/2023 [name] [name] is a 89 y.o. male coming in with complaint of R shoulder pain. Pain started about 2-3 weeks ago. Has gotten a little better since making appointment. No other concerns.      Past Medical History:  Diagnosis Date   Anxiety    CAD (coronary artery disease)    a. s/p stent to LAD 2008;  b. abnormal ETT 11/13 with VTach => LHC pLAD 30% ISR, mCFX 30%, dRCA 60-75% which had progressed from previous study in 2010 => FFR of RCA 0.92 => med Rx    Carotid stenosis    dopplers 1/14:  0-39% bilateral ICA stenosis   Colon polyps    Colon polyps    adenomatous   Diverticular disease of colon    Hemorrhoids    external and internal   History of BPH    HTN (hypertension)    Hx of echocardiogram    a. Echo 9/11:  EF 55-60%, normal motion, grade 2 diastolic dysfunction, mild MR, mild LAE, mild RAE, PASP 33.   Hyperlipidemia    IBS (irritable bowel syndrome)    Rhinitis    Stroke, lacunar (HCC) 08/24/2012   Old, 2011 head mri   Past Surgical History:  Procedure Laterality Date   APPENDECTOMY  1956   CORONARY STENT INTERVENTION N/A 06/15/2022   Procedure: CORONARY STENT INTERVENTION;  Surgeon: Corky Crafts, MD;  Location: [facility] INVASIVE CV LAB;  Service: Cardiovascular;  Laterality: N/A;   CORONARY STENT PLACEMENT   2008    Successful PCI of the lesion in the proximal LAD using a Promus    HERNIA REPAIR Right 2005   LEFT HEART CATH AND CORONARY ANGIOGRAPHY N/A 06/15/2022   Procedure: LEFT HEART CATH AND CORONARY ANGIOGRAPHY;  Surgeon: Corky Crafts, MD;  Location: [facility] INVASIVE CV LAB;  Service: Cardiovascular;  Laterality: N/A;   NASAL SINUS SURGERY  1975   PERCUTANEOUS CORONARY STENT INTERVENTION (PCI-S) N/A 05/03/2012   Procedure: PERCUTANEOUS CORONARY STENT INTERVENTION (PCI-S);  Surgeon: Tonny Bollman, MD;  Location: [facility];  Service: Cardiovascular;  Laterality: N/A;   Promus DES ok for 3T MRI  2008   Social History   Socioeconomic History   Marital status: Married    Spouse name: Not on file   Number of children: 3   Years of education: 16   Highest education level: Not on file  Occupational History   Occupation: businessman, Financial planner: RETIRED  Tobacco Use   Smoking status: Never    Passive exposure: Never   Smokeless tobacco: Never  Vaping Use   Vaping status: Never Used  Substance and Sexual Activity   Alcohol use: Not Currently    Comment: 4-6 drinks/week   Drug use: No   Sexual activity: Yes  Partners: Female  Other Topics Concern   Not on file  Social History Narrative   HSG, Engineer, 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 He has two daughters, 1 in [place] 1 in [place] (Dec '12) and a son who works with him. Several grandchildren.Reports that he spends a good deal of time at his home in [place] which he greatly enjoys and his home in the [place]. Enjoys driving his BMW convertible when in the mountains.      ACP - Yes CPR, yes for short-term mechanical ventilation for reversible disease. Recommended [email or web address] for consideration.   Social Determinants of Health   Financial Resource Strain: Low Risk  (11/16/2022)   Overall Financial  Resource Strain (CARDIA)    Difficulty of Paying Living Expenses: Not hard at all  Food Insecurity: No Food Insecurity (11/16/2022)   Hunger Vital Sign    Worried About Running Out of Food in the Last Year: Never true    Ran Out of Food in the Last Year: Never true  Transportation Needs: No Transportation Needs (11/16/2022)   PRAPARE - Administrator, Civil Service (Medical): No    Lack of Transportation (Non-Medical): No  Physical Activity: Sufficiently Active (11/16/2022)   Exercise Vital Sign    Days of Exercise per Week: 6 days    Minutes of Exercise per Session: 30 min  Stress: No Stress Concern Present (11/16/2022)   [name] of Occupational Health - Occupational Stress Questionnaire    Feeling of Stress : Not at all  Social Connections: Unknown (11/16/2022)   Social Connection and Isolation Panel [NHANES]    Frequency of Communication with Friends and Family: More than three times a week    Frequency of Social Gatherings with Friends and Family: Once a week    Attends Religious Services: Not on Marketing executive or Organizations: No    Attends Engineer, structural: 1 to 4 times per year    Marital Status: Married   No Known Allergies Family History  Problem Relation Age of Onset   Coronary artery disease Father    Heart attack Father    Rheum arthritis Mother    Cancer Brother        ? TYPE   Colon cancer Neg Hx    Stomach cancer Neg Hx    Pancreatic cancer Neg Hx      Current Outpatient Medications (Cardiovascular):    lisinopril (ZESTRIL) 10 MG tablet, Take 1 tablet (10 mg total) by mouth daily.   metoprolol succinate (TOPROL-XL) 50 MG 24 hr tablet, Take 1 tablet (50 mg total) by mouth daily.   nitroGLYCERIN (NITROSTAT) 0.4 MG SL tablet, Place 1 tablet (0.4 mg total) under the tongue every 5 (five) minutes as needed for chest pain.   rosuvastatin (CRESTOR) 40 MG tablet, TAKE 1 TABLET DAILY   Current Outpatient Medications  (Analgesics):    aspirin EC 81 MG tablet, Take 1 tablet (81 mg total) by mouth daily. Swallow whole.  Current Outpatient Medications (Hematological):    clopidogrel (PLAVIX) 75 MG tablet, Take 1 tablet (75 mg total) by mouth daily.  Current Outpatient Medications (Other):    ALPRAZolam (XANAX) 0.25 MG tablet, Take 1 tablet (0.25 mg total) by mouth 2 (two) times daily as needed for anxiety.   Cholecalciferol (VITAMIN D3) 50 MCG (2000 UT) capsule, Take 2,000 Units by mouth daily.   diazepam (  VALIUM) 5 MG tablet, Take 5 mg by mouth at bedtime.   Polyethyl Glycol-Propyl Glycol (SYSTANE ULTRA) 0.4-0.3 % SOLN, Place 1 drop into both eyes 2 (two) times daily.   psyllium (METAMUCIL) 58.6 % packet, Take 1 packet by mouth daily.   tamsulosin (FLOMAX) 0.4 MG CAPS capsule, Take 0.8 mg by mouth every morning.   traZODone (DESYREL) 50 MG tablet, TAKE 1/2 TO 1 TABLET BY MOUTH DAILY AT BEDTIME   zolpidem (AMBIEN) 10 MG tablet, TAKE 1 TABLET(10 MG) BY MOUTH AT BEDTIME AS NEEDED FOR SLEEP   Reviewed prior external information including notes and imaging from  primary care provider As well as notes that were available from care everywhere and other healthcare systems.  Past medical history, social, surgical and family history all reviewed in electronic medical record.  No pertanent information unless stated regarding to the chief complaint.   Review of Systems:  No headache, visual changes, nausea, vomiting, diarrhea, constipation, dizziness, abdominal pain, skin rash, fevers, chills, night sweats, weight loss, swollen lymph nodes, body aches, joint swelling, chest pain, shortness of breath, mood changes. POSITIVE muscle aches  Objective  Blood pressure 126/74, pulse 68, height 5\' 10"  (1.778 m), weight 145 lb (65.8 kg), SpO2 96%.   General: No apparent distress alert and oriented x3 mood and affect normal, dressed appropriately.  HEENT: Pupils equal, extraocular movements intact  Respiratory: Patient's  speak in full sentences and does not appear short of breath  Cardiovascular: No lower extremity edema, non tender, no erythema  Right shoulder exam shows  Procedure: Real-time Ultrasound Guided Injection of right glenohumeral joint Device: GE Logiq Q7  Ultrasound guided injection is preferred based studies that show increased duration, increased effect, greater accuracy, decreased procedural pain, increased response rate with ultrasound guided versus blind injection.  Verbal informed consent obtained.  Time-out conducted.  Noted no overlying erythema, induration, or other signs of local infection.  Skin prepped in a sterile fashion.  Local anesthesia: Topical Ethyl chloride.  With sterile technique and under real time ultrasound guidance:  Joint visualized.  23g 1  inch needle inserted posterior approach. Pictures taken for needle placement. Patient did have injection of  2 cc of 0.5% Marcaine, and 1.0 cc of Kenalog 40 mg/dL. Completed without difficulty  Pain immediately resolved suggesting accurate placement of the medication.  Advised to call if fevers/chills, erythema, induration, drainage, or persistent bleeding.  Impression: Technically successful ultrasound guided injection.     Impression and Recommendations:     The above documentation has been reviewed and is accurate and complete Judi Saa, DO

## 2023-03-07 ENCOUNTER — Other Ambulatory Visit: Payer: Self-pay

## 2023-03-07 ENCOUNTER — Telehealth: Payer: Self-pay | Admitting: Cardiovascular Disease

## 2023-03-07 ENCOUNTER — Ambulatory Visit: Payer: Medicare Other | Admitting: Family Medicine

## 2023-03-07 ENCOUNTER — Encounter: Payer: Self-pay | Admitting: Family Medicine

## 2023-03-07 VITALS — BP 126/74 | HR 68 | Ht 70.0 in | Wt 145.0 lb

## 2023-03-07 DIAGNOSIS — M25511 Pain in right shoulder: Secondary | ICD-10-CM

## 2023-03-07 DIAGNOSIS — M71311 Other bursal cyst, right shoulder: Secondary | ICD-10-CM

## 2023-03-07 NOTE — Telephone Encounter (Signed)
Patient states he has been having fatigue for the past two weeks.  He states he went for a walk this morning and had extreme fatigue with pain to his neck and shoulder.  He states he had no other symptoms.

## 2023-03-07 NOTE — Patient Instructions (Addendum)
Injection in shoulder today Good to see you!

## 2023-03-07 NOTE — Telephone Encounter (Signed)
Called patient back about his message. Patient complaining of back and shoulder pain he got when he went hiking and stated the symptoms he is feeling are similar to what he felt when he had his first stent placed. Patient stated he has felt really fatigue the last few weeks and he is concerned. Made patient an office visit with DOD on Friday, first available. Will send message to Dr. Eden Emms for further advisement.

## 2023-03-07 NOTE — Telephone Encounter (Signed)
Patient call again stating he is concerned about neck and shoulder pain he is having.

## 2023-03-07 NOTE — Telephone Encounter (Signed)
Attempted phone call to pt.  Call went straight to voicemail.  Left voicemail message to contact office at [phone].

## 2023-03-07 NOTE — Assessment & Plan Note (Signed)
Has been 2 years but started to have reaccumulation again.  Given injection.  Tolerated the procedure well, discussed icing regimen and home exercises.  Increase activity slowly.  Follow-up with me again in 6 to 8 weeks.

## 2023-03-08 ENCOUNTER — Encounter: Payer: Self-pay | Admitting: Cardiovascular Disease

## 2023-03-08 ENCOUNTER — Ambulatory Visit: Payer: Medicare Other | Attending: Cardiology | Admitting: Cardiovascular Disease

## 2023-03-08 ENCOUNTER — Other Ambulatory Visit: Payer: Self-pay | Admitting: Cardiovascular Disease

## 2023-03-08 VITALS — BP 130/82 | HR 71 | Ht 70.0 in | Wt 144.0 lb

## 2023-03-08 DIAGNOSIS — E782 Mixed hyperlipidemia: Secondary | ICD-10-CM | POA: Insufficient documentation

## 2023-03-08 DIAGNOSIS — I209 Angina pectoris, unspecified: Secondary | ICD-10-CM | POA: Diagnosis not present

## 2023-03-08 DIAGNOSIS — I10 Essential (primary) hypertension: Secondary | ICD-10-CM | POA: Diagnosis not present

## 2023-03-08 DIAGNOSIS — I2510 Atherosclerotic heart disease of native coronary artery without angina pectoris: Secondary | ICD-10-CM | POA: Diagnosis not present

## 2023-03-08 MED ORDER — ISOSORBIDE MONONITRATE ER 30 MG PO TB24: 15.0000 mg | ORAL_TABLET | Freq: Every day | ORAL | 3 refills | Status: DC

## 2023-03-08 NOTE — H&P (View-Only) (Signed)
Patient ID: [name] [name], male   DOB: Sep 26, 1932, 89 y.o.   MRN: [id number]     89 y.o. history of CAD. Stent to LAD in 2008.  ETT in 2013 abnormal with NSVT. Cath 05/01/12 30% ISR LaD and distal RCA 60-75% with FFR CT .92 Rx medically Myovue done 04/27/17 reviewed and normal with no ischemia study not gated due to PAC;s EF normal echo 06/25/15 55-60% LA only mildly dilated Had another myovue 04/22/20 which was normal with no ischemia EF estimtated 53%  Has Occular migraines Been going on for over 3-4 years  Seen by neurology And MRI/MRA no abnormality and EEG no epileptic foci  01/11/14  1-39% bilateral carotid disease   Has lower lumbar disc issue and bilateral hip pain Has had repeat injection by Dr Katrinka Blazing Also with right [facility] joint arthritis most recent injection 09/06/21 Has had Platelet Rich Plasma injections for his hips as well   03/2020 Went to [place] to visit his cousin [name] [name] who is a famous Horticulturist, commercial in [place] has a hypochondriac for husband They moved onto my street at [facility] last year  Daughter in [place] not working as PA but he likes her husband better Son works with him and got married for first time at age 44 to pharmacist at [employer]  Had a syncopal episode July  4th. 2022 Started with visual changes then BP low  80's with pre syncope  Felt weak for a couple hours. Had two drinks prior Not very hot out Occurred around 6 o'clock No  Chest pain, dyspnea Went to sleep and felt fine with no recurrence   Monitor: 12/29/20   showed no PAF but 8% burden atrial ectopy with self limited runs of SVT   He is building a [employer] on [address] Then will do one with a [name] on [address]   06/11/22:   had ? Angina. Tight pain in shoulders and back of neck with exertion that dissipates with rest Reproducible with similar degrees of stress No rest pain Also has had lower BP and we told him to hold his Zestril   Discussed prudence of heart cath given known distant history of  CAD/Stent and moderate residual Dx over 10 years ago  Cath done . 06/15/22 Dr Eldridge Dace patent stent in LAD 75% PLB stenosis with new stent placed EF was normal  Aggravated by red tape in starting [employer] and [employer]. Has a new place in [place]  This week having more body aches and chest pain Was in [place] walking his dog and had exertional shoulder and neck pain. Similar to pain before RCA stent He did not take nitroglycerin for it. No rest pain We both felt repeat heart cath indicated  Shared Decision Making/Informed Consent The risks [stroke (1 in 1000), death (1 in 1000), kidney failure [usually temporary] (1 in 500), bleeding (1 in 200), allergic reaction [possibly serious] (1 in 200)], benefits (diagnostic support and management of coronary artery disease) and alternatives of a cardiac catheterization were discussed in detail with Ms. [name] and she is willing to proceed.    ROS: Denies fever, malais, weight loss, blurry vision, decreased visual acuity, cough, sputum, SOB, hemoptysis, pleuritic pain, palpitaitons, heartburn, abdominal pain, melena, lower extremity edema, claudication, or rash.  All other systems reviewed and negative  General: BP 130/82   Pulse 71   Ht 5\' 10"  (1.778 m)   Wt 144 lb (65.3 kg)   SpO2 98%  BMI 20.66 kg/m  Affect appropriate Healthy:  appears stated age HEENT: normal Neck supple with no adenopathy JVP normal no bruits no thyromegaly Lungs clear with no wheezing and good diaphragmatic motion Heart:  S1/S2 no murmur, no rub, gallop or click PMI normal Abdomen: benighn, BS positve, no tenderness, no AAA no bruit.  No HSM or HJR Distal pulses intact with no bruits No edema Neuro non-focal Skin warm and dry No muscular weakness     Current Outpatient Medications  Medication Sig Dispense Refill   ALPRAZolam (XANAX) 0.25 MG tablet Take 1 tablet (0.25 mg total) by mouth 2 (two) times daily as needed for anxiety. 60 tablet 3   aspirin  EC 81 MG tablet Take 1 tablet (81 mg total) by mouth daily. Swallow whole. 30 tablet 5   Cholecalciferol (VITAMIN D3) 50 MCG (2000 UT) capsule Take 2,000 Units by mouth daily.     clopidogrel (PLAVIX) 75 MG tablet Take 1 tablet (75 mg total) by mouth daily. 90 tablet 3   diazepam (VALIUM) 5 MG tablet Take 5 mg by mouth at bedtime.     lisinopril (ZESTRIL) 10 MG tablet Take 1 tablet (10 mg total) by mouth daily. 90 tablet 3   metoprolol succinate (TOPROL-XL) 50 MG 24 hr tablet Take 1 tablet (50 mg total) by mouth daily. 90 tablet 3   nitroGLYCERIN (NITROSTAT) 0.4 MG SL tablet Place 1 tablet (0.4 mg total) under the tongue every 5 (five) minutes as needed for chest pain. 100 tablet 3   Polyethyl Glycol-Propyl Glycol (SYSTANE ULTRA) 0.4-0.3 % SOLN Place 1 drop into both eyes 2 (two) times daily.     psyllium (METAMUCIL) 58.6 % packet Take 1 packet by mouth daily.     rosuvastatin (CRESTOR) 40 MG tablet TAKE 1 TABLET DAILY 90 tablet 3   tamsulosin (FLOMAX) 0.4 MG CAPS capsule Take 0.8 mg by mouth every morning.     traZODone (DESYREL) 50 MG tablet TAKE 1/2 TO 1 TABLET BY MOUTH DAILY AT BEDTIME 30 tablet 1   zolpidem (AMBIEN) 10 MG tablet TAKE 1 TABLET(10 MG) BY MOUTH AT BEDTIME AS NEEDED FOR SLEEP 90 tablet 0   No current facility-administered medications for this visit.    Allergies  Patient has no known allergies.  Electrocardiogram:  03/08/2023  SR rate 70 RBBB  no changes PACls   Assessment and Plan  CAD: LAD stent in 2008 06/15/22 new stent to PLB of RCA Continue DAT  Continue beta blocker and statin Add imdur 15 mg cath arranged for Thursday with Dr Excell Seltzer Orders written lab called   HLD : on generic crestor now  Lab Results  Component Value Date   LDLCALC 51 07/02/2022    GERD:with constipation not helped with Miralax, ducolox or Linzess f/u Dr Teryl Lucy:  Chronic on ambien f/u Dr Yetta Barre   Visual Disturbance:  MRI/MRA normal EEG normal has been seen by neurology ?  Occular migraines   Ortho:  f/u Dr Katrinka Blazing for bilateral hip pain post injections MRI L3,4 disc bulge as well as right AC joint arthritis   Syncope:   Normal myovue 04/22/20 Labs ok Monitor with mostly atrial ectopy continue beta blocker BP lower now again ? Related to CAD Weight stable not postural D/c Zestril    F/U  6 months   Charlton Haws

## 2023-03-08 NOTE — Patient Instructions (Addendum)
Medication Instructions:  Your physician has recommended you make the following change in your medication:  1-START Imdur 15 mg by mouth daily  *If you need a refill on your cardiac medications before your next appointment, please call your pharmacy*  Lab Work: Your physician recommends that you have lab work today- BMET and CBC  If you have labs (blood work) drawn today and your tests are completely normal, you will receive your results only by: [employer] (if you have MyChart) OR A paper copy in the mail If you have any lab test that is abnormal or we need to change your treatment, we will call you to review the results.  Testing/Procedures: Your physician has requested that you have a cardiac catheterization. Cardiac catheterization is used to diagnose and/or treat various heart conditions. Doctors may recommend this procedure for a number of different reasons. The most common reason is to evaluate chest pain. Chest pain can be a symptom of coronary artery disease (CAD), and cardiac catheterization can show whether plaque is narrowing or blocking your heart's arteries. This procedure is also used to evaluate the valves, as well as measure the blood flow and oxygen levels in different parts of your heart. For further information please visit [email or web address] Please follow instruction sheet, as given.  Follow-Up: At [facility], you and your health needs are our priority.  As part of our continuing mission to provide you with exceptional heart care, we have created designated Provider Care Teams.  These Care Teams include your primary Cardiologist (physician) and Advanced Practice Providers (APPs -  Physician Assistants and Nurse Practitioners) who all work together to provide you with the care you need, when you need it. N We recommend signing up for the patient portal called "MyChart".  Sign up information is provided on this After Visit Summary.  MyChart is used to connect  with patients for Virtual Visits (Telemedicine).  Patients are able to view lab/test results, encounter notes, upcoming appointments, etc.  Non-urgent messages can be sent to your provider as well.   To learn more about what you can do with MyChart, go to [email or web address].    Your next appointment:   2 week(s)  Provider:   Charlton Haws, MD   or PA/NP  Other Instructions  [name] [name] A DEPT OF [facility] AT [facility] [address] [place], [place] [address] [place] [address] Dept: [phone] Loc: [phone]  [name] [name]  03/08/2023  You are scheduled for a Cardiac Catheterization on Thursday, October 24 with Dr. Tonny Bollman.  1. Please arrive at the [facility] (Main Entrance A) at [name]: [address] [place], [place] [address] at 11:30 AM (This time is 2 hour(s) before your procedure to ensure your preparation). Free valet parking service is available. You will check in at ADMITTING. The support person will be asked to wait in the waiting room.  It is OK to have someone drop you off and come back when you are ready to be discharged.    Special note: Every effort is made to have your procedure done on time. Please understand that emergencies sometimes delay scheduled procedures.  2. Diet: Do not eat solid foods after midnight.  The patient may have clear liquids until 5am upon the day of the procedure.  3. Labs: You will need to have blood drawn on Tuesday, October 22 at [facility] at [facility]. [address],  [place]  Open: 7:30am - 5pm    Phone: [phone]. You do not need to be fasting.  4. Medication instructions in preparation for your procedure:   Contrast Allergy: No  Stop taking, Lisinopril (Zestril or Prinivil) Thursday, October 24,   On the morning of your procedure, take your Aspirin 81 mg and Plavix/Clopidogrel and any morning medicines NOT listed above.   You may use sips of water.  5. Plan to go home the same day, you will only stay overnight if medically necessary. 6. Bring a current list of your medications and current insurance cards. 7. You MUST have a responsible person to drive you home. 8. Someone MUST be with you the first 24 hours after you arrive home or your discharge will be delayed. 9. Please wear clothes that are easy to get on and off and wear slip-on shoes.  Thank you for allowing [place] to care for you!   -- [facility]

## 2023-03-08 NOTE — Progress Notes (Unsigned)
Patient ID: [name] [name], male   DOB: Sep 26, 1932, 89 y.o.   MRN: [id number]     89 y.o. history of CAD. Stent to LAD in 2008.  ETT in 2013 abnormal with NSVT. Cath 05/01/12 30% ISR LaD and distal RCA 60-75% with FFR CT .92 Rx medically Myovue done 04/27/17 reviewed and normal with no ischemia study not gated due to PAC;s EF normal echo 06/25/15 55-60% LA only mildly dilated Had another myovue 04/22/20 which was normal with no ischemia EF estimtated 53%  Has Occular migraines Been going on for over 3-4 years  Seen by neurology And MRI/MRA no abnormality and EEG no epileptic foci  01/11/14  1-39% bilateral carotid disease   Has lower lumbar disc issue and bilateral hip pain Has had repeat injection by [name] [name] Also with right [facility] joint arthritis most recent injection 09/06/21 Has had Platelet Rich Plasma injections for his hips as well   03/2020 Went to [place] to visit his cousin [name] [name] who is a famous Horticulturist, commercial in [place] has a hypochondriac for husband They moved onto my street at [facility] last year  Daughter in [place] not working as PA but he likes her husband better Son works with him and got married for first time at age 44 to pharmacist at [employer]  Had a syncopal episode July  4th. 2022 Started with visual changes then BP low  80's with pre syncope  Felt weak for a couple hours. Had two drinks prior Not very hot out Occurred around 6 o'clock No  Chest pain, dyspnea Went to sleep and felt fine with no recurrence   Monitor: 12/29/20   showed no PAF but 8% burden atrial ectopy with self limited runs of SVT   He is building a [employer] [place] [address] Then will do one with a [name] [name] on [address]   06/11/22:   had ? Angina. Tight pain in shoulders and back of neck with exertion that dissipates with rest Reproducible with similar degrees of stress No rest pain Also has had lower BP and we told him to hold his Zestril   Discussed prudence of heart cath given known distant history of  CAD/Stent and moderate residual Dx over 10 years ago  Cath done . 06/15/22 [name] [name] patent stent in LAD 75% PLB stenosis with new stent placed EF was normal  Aggravated by red tape in starting [employer] and [employer]. Has a new place in [place]  This week having more body aches and chest pain Was in [place] walking his dog and had exertional shoulder and neck pain. Similar to pain before RCA stent He did not take nitroglycerin for it. No rest pain We both felt repeat heart cath indicated  Shared Decision Making/Informed Consent The risks [stroke (1 in 1000), death (1 in 1000), kidney failure [usually temporary] (1 in 500), bleeding (1 in 200), allergic reaction [possibly serious] (1 in 200)], benefits (diagnostic support and management of coronary artery disease) and alternatives of a cardiac catheterization were discussed in detail with [name] [name] and she is willing to proceed.    ROS: Denies fever, malais, weight loss, blurry vision, decreased visual acuity, cough, sputum, SOB, hemoptysis, pleuritic pain, palpitaitons, heartburn, abdominal pain, melena, lower extremity edema, claudication, or rash.  All other systems reviewed and negative  General: BP 130/82   Pulse 71   Ht 5\' 10"  (1.778 m)   Wt 144 lb (65.3 kg)   SpO2 98%  BMI 20.66 kg/m  Affect appropriate Healthy:  appears stated age HEENT: normal Neck supple with no adenopathy JVP normal no bruits no thyromegaly Lungs clear with no wheezing and good diaphragmatic motion Heart:  S1/S2 no murmur, no rub, gallop or click PMI normal Abdomen: benighn, BS positve, no tenderness, no AAA no bruit.  No HSM or HJR Distal pulses intact with no bruits No edema Neuro non-focal Skin warm and dry No muscular weakness     Current Outpatient Medications  Medication Sig Dispense Refill   ALPRAZolam (XANAX) 0.25 MG tablet Take 1 tablet (0.25 mg total) by mouth 2 (two) times daily as needed for anxiety. 60 tablet 3   aspirin  EC 81 MG tablet Take 1 tablet (81 mg total) by mouth daily. Swallow whole. 30 tablet 5   Cholecalciferol (VITAMIN D3) 50 MCG (2000 UT) capsule Take 2,000 Units by mouth daily.     clopidogrel (PLAVIX) 75 MG tablet Take 1 tablet (75 mg total) by mouth daily. 90 tablet 3   diazepam (VALIUM) 5 MG tablet Take 5 mg by mouth at bedtime.     lisinopril (ZESTRIL) 10 MG tablet Take 1 tablet (10 mg total) by mouth daily. 90 tablet 3   metoprolol succinate (TOPROL-XL) 50 MG 24 hr tablet Take 1 tablet (50 mg total) by mouth daily. 90 tablet 3   nitroGLYCERIN (NITROSTAT) 0.4 MG SL tablet Place 1 tablet (0.4 mg total) under the tongue every 5 (five) minutes as needed for chest pain. 100 tablet 3   Polyethyl Glycol-Propyl Glycol (SYSTANE ULTRA) 0.4-0.3 % SOLN Place 1 drop into both eyes 2 (two) times daily.     psyllium (METAMUCIL) 58.6 % packet Take 1 packet by mouth daily.     rosuvastatin (CRESTOR) 40 MG tablet TAKE 1 TABLET DAILY 90 tablet 3   tamsulosin (FLOMAX) 0.4 MG CAPS capsule Take 0.8 mg by mouth every morning.     traZODone (DESYREL) 50 MG tablet TAKE 1/2 TO 1 TABLET BY MOUTH DAILY AT BEDTIME 30 tablet 1   zolpidem (AMBIEN) 10 MG tablet TAKE 1 TABLET(10 MG) BY MOUTH AT BEDTIME AS NEEDED FOR SLEEP 90 tablet 0   No current facility-administered medications for this visit.    Allergies  Patient has no known allergies.  Electrocardiogram:  03/08/2023  SR rate 70 RBBB  no changes PACls   Assessment and Plan  CAD: LAD stent in 2008 06/15/22 new stent to PLB of RCA Continue DAT  Continue beta blocker and statin Add imdur 15 mg cath arranged for Thursday with [name] [name] Orders written lab called   HLD : on generic crestor now  Lab Results  Component Value Date   LDLCALC 51 07/02/2022    GERD:with constipation not helped with Miralax, ducolox or Linzess f/u [name] [name]:  Chronic on ambien f/u [name] [name]   Visual Disturbance:  MRI/MRA normal EEG normal has been seen by neurology ?  Occular migraines   Ortho:  f/u [name] [name] for bilateral hip pain post injections MRI L3,4 disc bulge as well as right AC joint arthritis   Syncope:   Normal myovue 04/22/20 Labs ok Monitor with mostly atrial ectopy continue beta blocker BP lower now again ? Related to CAD Weight stable not postural D/c Zestril    F/U  6 months   Charlton Haws

## 2023-03-09 ENCOUNTER — Telehealth: Payer: Self-pay | Admitting: *Deleted

## 2023-03-09 LAB — CBC
Hematocrit: 39.5 % (ref 37.5–51.0)
Hemoglobin: 13.2 g/dL (ref 13.0–17.7)
MCH: 31.1 pg (ref 26.6–33.0)
MCHC: 33.4 g/dL (ref 31.5–35.7)
MCV: 93 fL (ref 79–97)
Platelets: 169 10*3/uL (ref 150–450)
RBC: 4.25 x10E6/uL (ref 4.14–5.80)
RDW: 13.2 % (ref 11.6–15.4)
WBC: 6.8 10*3/uL (ref 3.4–10.8)

## 2023-03-09 LAB — BASIC METABOLIC PANEL
BUN/Creatinine Ratio: 17 (ref 10–24)
BUN: 21 mg/dL (ref 8–27)
CO2: 20 mmol/L (ref 20–29)
Calcium: 9.4 mg/dL (ref 8.6–10.2)
Chloride: 108 mmol/L — ABNORMAL HIGH (ref 96–106)
Creatinine, Ser: 1.27 mg/dL (ref 0.76–1.27)
Glucose: 100 mg/dL — ABNORMAL HIGH (ref 70–99)
Potassium: 4.4 mmol/L (ref 3.5–5.2)
Sodium: 140 mmol/L (ref 134–144)
eGFR: 55 mL/min/{1.73_m2} — ABNORMAL LOW (ref >59)

## 2023-03-09 NOTE — Telephone Encounter (Signed)
Cardiac Catheterization scheduled at [facility] for: Thursday March 10, 2023 1:30 PM Arrival time [facility] Main Entrance A at: 11:30 AM  Nothing to eat after midnight prior to procedure, clear liquids until 5 AM day of procedure.  Medication instructions: -Hold:  Lisinopril-AM of procedure -GFR <60 (55) -Other usual morning medications can be taken with sips of water including aspirin 81 mg.  Plan to go home the same day, you will only stay overnight if medically necessary.  You must have responsible adult to drive you home.  Someone must be with you the first 24 hours after you arrive home.  Reviewed procedure instructions with patient.

## 2023-03-10 ENCOUNTER — Encounter (HOSPITAL_COMMUNITY)
Admission: RE | Disposition: A | Discharge status: Home / Self Care | Payer: Self-pay | Source: Home / Self Care | Attending: Cardiovascular Disease

## 2023-03-10 ENCOUNTER — Ambulatory Visit (HOSPITAL_COMMUNITY)
Admission: RE | Admit: 2023-03-10 | Discharge: 2023-03-10 | Disposition: A | Discharge status: Home / Self Care | Payer: Medicare Other | Attending: Cardiovascular Disease | Admitting: Cardiovascular Disease

## 2023-03-10 ENCOUNTER — Other Ambulatory Visit: Payer: Self-pay

## 2023-03-10 DIAGNOSIS — K219 Gastro-esophageal reflux disease without esophagitis: Secondary | ICD-10-CM | POA: Insufficient documentation

## 2023-03-10 DIAGNOSIS — I25119 Atherosclerotic heart disease of native coronary artery with unspecified angina pectoris: Secondary | ICD-10-CM | POA: Insufficient documentation

## 2023-03-10 DIAGNOSIS — E785 Hyperlipidemia, unspecified: Secondary | ICD-10-CM | POA: Diagnosis not present

## 2023-03-10 DIAGNOSIS — I6523 Occlusion and stenosis of bilateral carotid arteries: Secondary | ICD-10-CM | POA: Insufficient documentation

## 2023-03-10 DIAGNOSIS — Z955 Presence of coronary angioplasty implant and graft: Secondary | ICD-10-CM | POA: Insufficient documentation

## 2023-03-10 DIAGNOSIS — M25551 Pain in right hip: Secondary | ICD-10-CM | POA: Diagnosis not present

## 2023-03-10 DIAGNOSIS — K5900 Constipation, unspecified: Secondary | ICD-10-CM | POA: Diagnosis not present

## 2023-03-10 DIAGNOSIS — G4700 Insomnia, unspecified: Secondary | ICD-10-CM | POA: Insufficient documentation

## 2023-03-10 DIAGNOSIS — M25552 Pain in left hip: Secondary | ICD-10-CM | POA: Insufficient documentation

## 2023-03-10 DIAGNOSIS — H539 Unspecified visual disturbance: Secondary | ICD-10-CM | POA: Insufficient documentation

## 2023-03-10 DIAGNOSIS — Z79899 Other long term (current) drug therapy: Secondary | ICD-10-CM | POA: Insufficient documentation

## 2023-03-10 DIAGNOSIS — R55 Syncope and collapse: Secondary | ICD-10-CM | POA: Diagnosis not present

## 2023-03-10 DIAGNOSIS — I209 Angina pectoris, unspecified: Secondary | ICD-10-CM

## 2023-03-10 HISTORY — PX: LEFT HEART CATH AND CORONARY ANGIOGRAPHY: CATH118249

## 2023-03-10 SURGERY — LEFT HEART CATH AND CORONARY ANGIOGRAPHY
Anesthesia: LOCAL

## 2023-03-10 MED ORDER — FENTANYL CITRATE (PF) 100 MCG/2ML IJ SOLN
INTRAMUSCULAR | Status: AC
  Filled 2023-03-10: qty 2

## 2023-03-10 MED ORDER — VERAPAMIL HCL 2.5 MG/ML IV SOLN
INTRAVENOUS | Status: AC
  Filled 2023-03-10: qty 2

## 2023-03-10 MED ORDER — SODIUM CHLORIDE 0.9 % IV SOLN
INTRAVENOUS | Status: AC
Start: 2023-03-10 — End: 2023-03-10

## 2023-03-10 MED ORDER — LABETALOL HCL 5 MG/ML IV SOLN: 10.0000 mg | INTRAVENOUS | Status: DC | PRN

## 2023-03-10 MED ORDER — FENTANYL CITRATE (PF) 100 MCG/2ML IJ SOLN
INTRAMUSCULAR | Status: DC | PRN
  Administered 2023-03-10: 25 ug via INTRAVENOUS

## 2023-03-10 MED ORDER — SODIUM CHLORIDE 0.9 % WEIGHT BASED INFUSION
3.0000 mL/kg/h | INTRAVENOUS | Status: AC
  Administered 2023-03-10: 3 mL/kg/h via INTRAVENOUS

## 2023-03-10 MED ORDER — HEPARIN SODIUM (PORCINE) 1000 UNIT/ML IJ SOLN
INTRAMUSCULAR | Status: DC | PRN
  Administered 2023-03-10: 4000 [IU] via INTRAVENOUS

## 2023-03-10 MED ORDER — HEPARIN (PORCINE) IN NACL 1000-0.9 UT/500ML-% IV SOLN
INTRAVENOUS | Status: DC | PRN
  Administered 2023-03-10: 1000 mL via INTRA_ARTERIAL

## 2023-03-10 MED ORDER — ONDANSETRON HCL 4 MG/2ML IJ SOLN: 4.0000 mg | Freq: Four times a day (QID) | INTRAMUSCULAR | Status: DC | PRN

## 2023-03-10 MED ORDER — SODIUM CHLORIDE 0.9 % WEIGHT BASED INFUSION
1.0000 mL/kg/h | INTRAVENOUS | Status: DC
Start: 2023-03-10 — End: 2023-03-11

## 2023-03-10 MED ORDER — IOHEXOL 350 MG/ML SOLN
INTRAVENOUS | Status: DC | PRN
  Administered 2023-03-10: 30 mL via INTRA_ARTERIAL

## 2023-03-10 MED ORDER — LIDOCAINE HCL (PF) 1 % IJ SOLN
INTRAMUSCULAR | Status: AC
  Filled 2023-03-10: qty 30

## 2023-03-10 MED ORDER — LIDOCAINE HCL (PF) 1 % IJ SOLN
INTRAMUSCULAR | Status: DC | PRN
  Administered 2023-03-10: 2 mL

## 2023-03-10 MED ORDER — HEPARIN SODIUM (PORCINE) 1000 UNIT/ML IJ SOLN
INTRAMUSCULAR | Status: AC
  Filled 2023-03-10: qty 10

## 2023-03-10 MED ORDER — SODIUM CHLORIDE 0.9 % IV SOLN
250.0000 mL | INTRAVENOUS | Status: DC | PRN
Start: 2023-03-10 — End: 2023-03-11

## 2023-03-10 MED ORDER — VERAPAMIL HCL 2.5 MG/ML IV SOLN
INTRAVENOUS | Status: DC | PRN
  Administered 2023-03-10: 10 mL via INTRA_ARTERIAL

## 2023-03-10 MED ORDER — ASPIRIN 81 MG PO CHEW: 81.0000 mg | CHEWABLE_TABLET | ORAL | Status: DC

## 2023-03-10 MED ORDER — HYDRALAZINE HCL 20 MG/ML IJ SOLN: 10.0000 mg | INTRAMUSCULAR | Status: DC | PRN

## 2023-03-10 MED ORDER — SODIUM CHLORIDE 0.9% FLUSH: 3.0000 mL | Freq: Two times a day (BID) | INTRAVENOUS | Status: DC

## 2023-03-10 MED ORDER — MIDAZOLAM HCL 2 MG/2ML IJ SOLN
INTRAMUSCULAR | Status: AC
  Filled 2023-03-10: qty 2

## 2023-03-10 MED ORDER — MIDAZOLAM HCL 2 MG/2ML IJ SOLN
INTRAMUSCULAR | Status: DC | PRN
  Administered 2023-03-10: 2 mg via INTRAVENOUS

## 2023-03-10 MED ORDER — SODIUM CHLORIDE 0.9% FLUSH
3.0000 mL | INTRAVENOUS | Status: DC | PRN
Start: 2023-03-10 — End: 2023-03-11

## 2023-03-10 MED ORDER — ACETAMINOPHEN 325 MG PO TABS: 650.0000 mg | ORAL_TABLET | ORAL | Status: DC | PRN

## 2023-03-10 SURGICAL SUPPLY — 7 items
CATH 5FR JL3.5 JR4 ANG PIG MP (CATHETERS) IMPLANT
DEVICE RAD COMP TR BAND LRG (VASCULAR PRODUCTS) IMPLANT
GLIDESHEATH SLEND SS 6F .021 (SHEATH) IMPLANT
GUIDEWIRE INQWIRE 1.5J.035X260 (WIRE) IMPLANT
INQWIRE 1.5J .035X260CM (WIRE) ×1
PACK CARDIAC CATHETERIZATION (CUSTOM PROCEDURE TRAY) ×1 IMPLANT
SET ATX-X65L (MISCELLANEOUS) IMPLANT

## 2023-03-10 NOTE — Interval H&P Note (Signed)
  Cath Lab Visit (complete for each Cath Lab visit)  Clinical Evaluation Leading to the Procedure:   ACS: No.  Non-ACS:    Anginal Classification: CCS II  Anti-ischemic medical therapy: Maximal Therapy (2 or more classes of medications)  Non-Invasive Test Results: No non-invasive testing performed  Prior CABG: No previous CABG      History and Physical Interval Note:  03/10/2023 1:03 PM  [name] [name]  has presented today for surgery, with the diagnosis of chest pain.  The various methods of treatment have been discussed with the patient and family. After consideration of risks, benefits and other options for treatment, the patient has consented to  Procedure(s): LEFT HEART CATH AND CORONARY ANGIOGRAPHY (N/A) as a surgical intervention.  The patient's history has been reviewed, patient examined, no change in status, stable for surgery.  I have reviewed the patient's chart and labs.  Questions were answered to the patient's satisfaction.     Tonny Bollman

## 2023-03-10 NOTE — Progress Notes (Signed)
This RN walked into patients room to assess and remove air from TR Band and noticed pt was bleeding from site. Patient blood loss was approximately 250cc's.  This RN began to insert more air into the band in order to stop the bleed. 13cc's of air was inserted into the band and Dr. Excell Seltzer, MD was paged about patients current situation. Cath lab returned the page and Judie Grieve, RN was sent over and assessed the patient and removed 2 cc's of air. At 1552 there was 11 cc's of air left in the band and bleeding was controlled. The patient experienced a vagal response and became symptomatic with sharp neck pain, nausea, diaphoresis and hypotension with systolic pressures of 80's-90's and HR 's of 40's-50's . This RN along with Jo,RN were able to lay patient flat, increase fluids and monitor vital q 5 mins. Lindsay,NP was able to come over an assess the patient. Vitals returned to normal with systolic's in the 120's and HR in the 50's. Patient states feeling much better with decreased neck pain and diaphoresis. Patient will remain in Procedural Short Stay recovery until all air is released from TR band and Vitals are stable to be discharged home.  At 1700 Dr. Ozella Almond gave verbal to leave air in band for 1 hour before trying to remove anymore air.

## 2023-03-11 ENCOUNTER — Ambulatory Visit: Payer: Medicare Other | Admitting: Cardiology

## 2023-03-11 ENCOUNTER — Encounter (HOSPITAL_COMMUNITY): Payer: Self-pay | Admitting: Cardiovascular Disease

## 2023-03-15 ENCOUNTER — Ambulatory Visit (INDEPENDENT_AMBULATORY_CARE_PROVIDER_SITE_OTHER): Payer: Medicare Other | Admitting: Family Medicine

## 2023-03-15 ENCOUNTER — Ambulatory Visit (INDEPENDENT_AMBULATORY_CARE_PROVIDER_SITE_OTHER): Payer: Medicare Other

## 2023-03-15 ENCOUNTER — Encounter: Payer: Self-pay | Admitting: Family Medicine

## 2023-03-15 ENCOUNTER — Other Ambulatory Visit: Payer: Self-pay

## 2023-03-15 VITALS — BP 132/68 | HR 70 | Ht 70.0 in | Wt 143.0 lb

## 2023-03-15 DIAGNOSIS — M5412 Radiculopathy, cervical region: Secondary | ICD-10-CM | POA: Diagnosis not present

## 2023-03-15 DIAGNOSIS — M5030 Other cervical disc degeneration, unspecified cervical region: Secondary | ICD-10-CM | POA: Diagnosis not present

## 2023-03-15 DIAGNOSIS — M542 Cervicalgia: Secondary | ICD-10-CM | POA: Diagnosis not present

## 2023-03-15 DIAGNOSIS — M25511 Pain in right shoulder: Secondary | ICD-10-CM | POA: Diagnosis not present

## 2023-03-15 DIAGNOSIS — M47812 Spondylosis without myelopathy or radiculopathy, cervical region: Secondary | ICD-10-CM | POA: Diagnosis not present

## 2023-03-15 DIAGNOSIS — M4802 Spinal stenosis, cervical region: Secondary | ICD-10-CM | POA: Diagnosis not present

## 2023-03-15 DIAGNOSIS — M25519 Pain in unspecified shoulder: Secondary | ICD-10-CM | POA: Insufficient documentation

## 2023-03-15 NOTE — Assessment & Plan Note (Signed)
Bilateral trigger points given today.  Tolerated the procedure well.  Think this is better than giving any medications with patient recently having the difficulty with his chest discomfort.  Workup had been benign no.  If continuing to have difficulty we do need to consider the possibility of advanced imaging of the cervical spine and may be even a CT of the chest.  Continue to be active.  Follow-up again in 6 to 8 weeks

## 2023-03-15 NOTE — Progress Notes (Signed)
[facility] [address] [place] [address] Phone: [phone] Subjective:   [name] [name], am serving as a scribe for Dr. Antoine Primas.  I'm seeing this patient by the request  of:  Etta Grandchild, MD  CC: Right shoulder pain  [id number]  [name] [name] is a 89 y.o. male coming in with complaint of right shoulder pain.  Was seen by me on October 21.  Patient unfortunately had an injection in the right glenohumeral joint 8 days ago.  Since that time 5 days ago patient did have a catheterization done. Had some severe neck aches recently and he thought it might be his heart as symptoms were similar to pain in January when he had to get a stent. Pain is relieved by Tylenol. Pain in both traps. Denies any radiating symptoms.        Past Medical History:  Diagnosis Date   Anxiety    CAD (coronary artery disease)    a. s/p stent to LAD 2008;  b. abnormal ETT 11/13 with VTach => LHC pLAD 30% ISR, mCFX 30%, dRCA 60-75% which had progressed from previous study in 2010 => FFR of RCA 0.92 => med Rx    Carotid stenosis    dopplers 1/14:  0-39% bilateral ICA stenosis   Colon polyps    Colon polyps    adenomatous   Diverticular disease of colon    Hemorrhoids    external and internal   History of BPH    HTN (hypertension)    Hx of echocardiogram    a. Echo 9/11:  EF 55-60%, normal motion, grade 2 diastolic dysfunction, mild MR, mild LAE, mild RAE, PASP 33.   Hyperlipidemia    IBS (irritable bowel syndrome)    Rhinitis    Stroke, lacunar (HCC) 08/24/2012   Old, 2011 head mri   Past Surgical History:  Procedure Laterality Date   APPENDECTOMY  1956   CORONARY STENT INTERVENTION N/A 06/15/2022   Procedure: CORONARY STENT INTERVENTION;  Surgeon: Corky Crafts, MD;  Location: [facility] INVASIVE CV LAB;  Service: Cardiovascular;  Laterality: N/A;   CORONARY STENT PLACEMENT  2008    Successful PCI of the lesion in the proximal LAD using a Promus     HERNIA REPAIR Right 2005   LEFT HEART CATH AND CORONARY ANGIOGRAPHY N/A 06/15/2022   Procedure: LEFT HEART CATH AND CORONARY ANGIOGRAPHY;  Surgeon: Corky Crafts, MD;  Location: [facility] INVASIVE CV LAB;  Service: Cardiovascular;  Laterality: N/A;   LEFT HEART CATH AND CORONARY ANGIOGRAPHY N/A 03/10/2023   Procedure: LEFT HEART CATH AND CORONARY ANGIOGRAPHY;  Surgeon: Tonny Bollman, MD;  Location: [facility] INVASIVE CV LAB;  Service: Cardiovascular;  Laterality: N/A;   NASAL SINUS SURGERY  1975   PERCUTANEOUS CORONARY STENT INTERVENTION (PCI-S) N/A 05/03/2012   Procedure: PERCUTANEOUS CORONARY STENT INTERVENTION (PCI-S);  Surgeon: Tonny Bollman, MD;  Location: [facility] CATH LAB;  Service: Cardiovascular;  Laterality: N/A;   Promus DES ok for 3T MRI  2008   Social History   Socioeconomic History   Marital status: Married    Spouse name: Not on file   Number of children: 3   Years of education: 16   Highest education level: Not on file  Occupational History   Occupation: businessman, Financial planner: RETIRED  Tobacco Use   Smoking status: Never    Passive exposure: Never   Smokeless tobacco: Never  Vaping Use   Vaping status:  Never Used  Substance and Sexual Activity   Alcohol use: Not Currently    Comment: 4-6 drinks/week   Drug use: No   Sexual activity: Yes    Partners: Female  Other Topics Concern   Not on file  Social History Narrative   HSG, Engineer, 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 He has two daughters, 1 in [place] 1 in [place] (Dec '12) and a son who works with him. Several grandchildren.Reports that he spends a good deal of time at his home in [facility] which he greatly enjoys and his home in the [place]. Enjoys driving his BMW convertible when in the mountains.      ACP - Yes CPR, yes for short-term mechanical ventilation for reversible disease. Recommended  [email or web address] for consideration.   Social Determinants of Health   Financial Resource Strain: Low Risk  (11/16/2022)   Overall Financial Resource Strain (CARDIA)    Difficulty of Paying Living Expenses: Not hard at all  Food Insecurity: No Food Insecurity (11/16/2022)   Hunger Vital Sign    Worried About Running Out of Food in the Last Year: Never true    Ran Out of Food in the Last Year: Never true  Transportation Needs: No Transportation Needs (11/16/2022)   PRAPARE - Administrator, Civil Service (Medical): No    Lack of Transportation (Non-Medical): No  Physical Activity: Sufficiently Active (11/16/2022)   Exercise Vital Sign    Days of Exercise per Week: 6 days    Minutes of Exercise per Session: 30 min  Stress: No Stress Concern Present (11/16/2022)   [name] of Occupational Health - Occupational Stress Questionnaire    Feeling of Stress : Not at all  Social Connections: Unknown (11/16/2022)   Social Connection and Isolation Panel [NHANES]    Frequency of Communication with Friends and Family: More than three times a week    Frequency of Social Gatherings with Friends and Family: Once a week    Attends Religious Services: Not on Marketing executive or Organizations: No    Attends Engineer, structural: 1 to 4 times per year    Marital Status: Married   No Known Allergies Family History  Problem Relation Age of Onset   Coronary artery disease Father    Heart attack Father    Rheum arthritis Mother    Cancer Brother        ? TYPE   Colon cancer Neg Hx    Stomach cancer Neg Hx    Pancreatic cancer Neg Hx      Current Outpatient Medications (Cardiovascular):    isosorbide mononitrate (IMDUR) 30 MG 24 hr tablet, Take 0.5 tablets (15 mg total) by mouth daily.   lisinopril (ZESTRIL) 10 MG tablet, Take 1 tablet (10 mg total) by mouth daily.   metoprolol succinate (TOPROL-XL) 50 MG 24 hr tablet, Take 1 tablet (50 mg total) by mouth  daily.   nitroGLYCERIN (NITROSTAT) 0.4 MG SL tablet, Place 1 tablet (0.4 mg total) under the tongue every 5 (five) minutes as needed for chest pain.   rosuvastatin (CRESTOR) 40 MG tablet, TAKE 1 TABLET DAILY   Current Outpatient Medications (Analgesics):    aspirin EC 81 MG tablet, Take 1 tablet (81 mg total) by mouth daily. Swallow whole.  Current Outpatient Medications (Hematological):    clopidogrel (PLAVIX) 75 MG tablet, Take 1 tablet (75 mg total)  by mouth daily.  Current Outpatient Medications (Other):    ALPRAZolam (XANAX) 0.25 MG tablet, Take 1 tablet (0.25 mg total) by mouth 2 (two) times daily as needed for anxiety.   Cholecalciferol (VITAMIN D3) 50 MCG (2000 UT) capsule, Take 2,000 Units by mouth daily.   diazepam (VALIUM) 5 MG tablet, Take 5 mg by mouth at bedtime as needed for sedation.   Polyethyl Glycol-Propyl Glycol (SYSTANE ULTRA) 0.4-0.3 % SOLN, Place 1 drop into both eyes at bedtime.   psyllium (METAMUCIL) 58.6 % packet, Take 1 packet by mouth daily.   tamsulosin (FLOMAX) 0.4 MG CAPS capsule, Take 0.8 mg by mouth every morning.   zolpidem (AMBIEN) 10 MG tablet, TAKE 1 TABLET(10 MG) BY MOUTH AT BEDTIME AS NEEDED FOR SLEEP (Patient taking differently: Take 10 mg by mouth at bedtime.)   Reviewed prior external information including notes and imaging from  primary care provider As well as notes that were available from care everywhere and other healthcare systems.  Past medical history, social, surgical and family history all reviewed in electronic medical record.  No pertanent information unless stated regarding to the chief complaint.   Review of Systems:  No headache, visual changes, nausea, vomiting, diarrhea, constipation, dizziness, abdominal pain, skin rash, fevers, chills, night sweats, weight loss, swollen lymph nodes, body aches, joint swelling, chest pain, shortness of breath, mood changes. POSITIVE muscle aches  Objective  Blood pressure 132/68, pulse 70,  height 5\' 10"  (1.778 m), weight 143 lb (64.9 kg), SpO2 97%.   General: No apparent distress alert and oriented x3 mood and affect normal, dressed appropriately.  HEENT: Pupils equal, extraocular movements intact  Respiratory: Patient's speak in full sentences and does not appear short of breath  Cardiovascular: No lower extremity edema, non tender, no erythema  Back exam does have some loss lordosis noted.  Patient has significant tightness noted in the left shoulder area.  Seems to be in the bilateral aspect of the shoulders and had multiple trigger points.   After verbal consent patient was prepped with alcohol swab and 4 distinct trigger points in the shoulder regions left and right including the rhomboids and the levator scapula and trapezius patient given injections with a total of 3 cc of 0.5% Marcaine and 1 cc of Kenalog 40 mg/mL.  No blood loss.  Patient able to take deep breaths afterwards.  No Band-Aids placed per patient.  Postinjection instructions given   Impression and Recommendations:     The above documentation has been reviewed and is accurate and complete Judi Saa, DO

## 2023-03-15 NOTE — Patient Instructions (Addendum)
Xray on your way out Trigger point injections today Give me an update on Friday

## 2023-03-15 NOTE — Assessment & Plan Note (Signed)
Has had history of cervical radiculitis.  Could be potentially contributing.  Repeat x-rays noted today.  We will also did some trigger point injections.  Hopeful that this will make some improvement.  Patient feels and continuing to have some discomfort.  Recently checked up with a cardiac catheterization of the heart not showing any coronary artery disease.  Follow-up with me again in 6 to 8 weeks otherwise.

## 2023-03-18 ENCOUNTER — Encounter: Payer: Self-pay | Admitting: Family Medicine

## 2023-03-22 ENCOUNTER — Ambulatory Visit: Payer: Medicare Other | Admitting: Family Medicine

## 2023-03-22 NOTE — Progress Notes (Deleted)
[facility] [address] [place] [address] Phone: [phone] Subjective:    I'm seeing this patient by the request  of:  Etta Grandchild, MD  CC:   [id number]  [name] [name] is a 89 y.o. male coming in with complaint of ***  Onset-  Location Duration-  Character- Aggravating factors- Reliving factors-  Therapies tried-  Severity-     Past Medical History:  Diagnosis Date   Anxiety    CAD (coronary artery disease)    a. s/p stent to LAD 2008;  b. abnormal ETT 11/13 with VTach => LHC pLAD 30% ISR, mCFX 30%, dRCA 60-75% which had progressed from previous study in 2010 => FFR of RCA 0.92 => med Rx    Carotid stenosis    dopplers 1/14:  0-39% bilateral ICA stenosis   Colon polyps    Colon polyps    adenomatous   Diverticular disease of colon    Hemorrhoids    external and internal   History of BPH    HTN (hypertension)    Hx of echocardiogram    a. Echo 9/11:  EF 55-60%, normal motion, grade 2 diastolic dysfunction, mild MR, mild LAE, mild RAE, PASP 33.   Hyperlipidemia    IBS (irritable bowel syndrome)    Rhinitis    Stroke, lacunar (HCC) 08/24/2012   Old, 2011 head mri   Past Surgical History:  Procedure Laterality Date   APPENDECTOMY  1956   CORONARY STENT INTERVENTION N/A 06/15/2022   Procedure: CORONARY STENT INTERVENTION;  Surgeon: Corky Crafts, MD;  Location: [facility] INVASIVE CV LAB;  Service: Cardiovascular;  Laterality: N/A;   CORONARY STENT PLACEMENT  2008    Successful PCI of the lesion in the proximal LAD using a Promus    HERNIA REPAIR Right 2005   LEFT HEART CATH AND CORONARY ANGIOGRAPHY N/A 06/15/2022   Procedure: LEFT HEART CATH AND CORONARY ANGIOGRAPHY;  Surgeon: Corky Crafts, MD;  Location: [facility] INVASIVE CV LAB;  Service: Cardiovascular;  Laterality: N/A;   LEFT HEART CATH AND CORONARY ANGIOGRAPHY N/A 03/10/2023   Procedure: LEFT HEART CATH AND CORONARY ANGIOGRAPHY;  Surgeon: Tonny Bollman, MD;   Location: [facility] INVASIVE CV LAB;  Service: Cardiovascular;  Laterality: N/A;   NASAL SINUS SURGERY  1975   PERCUTANEOUS CORONARY STENT INTERVENTION (PCI-S) N/A 05/03/2012   Procedure: PERCUTANEOUS CORONARY STENT INTERVENTION (PCI-S);  Surgeon: Tonny Bollman, MD;  Location: [facility] CATH LAB;  Service: Cardiovascular;  Laterality: N/A;   Promus DES ok for 3T MRI  2008   Social History   Socioeconomic History   Marital status: Married    Spouse name: Not on file   Number of children: 3   Years of education: 16   Highest education level: Not on file  Occupational History   Occupation: businessman, Financial planner: RETIRED  Tobacco Use   Smoking status: Never    Passive exposure: Never   Smokeless tobacco: Never  Vaping Use   Vaping status: Never Used  Substance and Sexual Activity   Alcohol use: Not Currently    Comment: 4-6 drinks/week   Drug use: No   Sexual activity: Yes    Partners: Female  Other Topics Concern   Not on file  Social History Narrative   HSG, Engineer, 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 He has two daughters, 1 in [place] 1 in [place] (  Dec '12) and a son who works with him. Several grandchildren.Reports that he spends a good deal of time at his home in [facility] which he greatly enjoys and his home in the [place]. Enjoys driving his BMW convertible when in the mountains.      ACP - Yes CPR, yes for short-term mechanical ventilation for reversible disease. Recommended [email or web address] for consideration.   Social Determinants of Health   Financial Resource Strain: Low Risk  (11/16/2022)   Overall Financial Resource Strain (CARDIA)    Difficulty of Paying Living Expenses: Not hard at all  Food Insecurity: No Food Insecurity (11/16/2022)   Hunger Vital Sign    Worried About Running Out of Food in the Last Year: Never true    Ran Out of Food in the Last  Year: Never true  Transportation Needs: No Transportation Needs (11/16/2022)   PRAPARE - Administrator, Civil Service (Medical): No    Lack of Transportation (Non-Medical): No  Physical Activity: Sufficiently Active (11/16/2022)   Exercise Vital Sign    Days of Exercise per Week: 6 days    Minutes of Exercise per Session: 30 min  Stress: No Stress Concern Present (11/16/2022)   [name] of Occupational Health - Occupational Stress Questionnaire    Feeling of Stress : Not at all  Social Connections: Unknown (11/16/2022)   Social Connection and Isolation Panel [NHANES]    Frequency of Communication with Friends and Family: More than three times a week    Frequency of Social Gatherings with Friends and Family: Once a week    Attends Religious Services: Not on Marketing executive or Organizations: No    Attends Engineer, structural: 1 to 4 times per year    Marital Status: Married   No Known Allergies Family History  Problem Relation Age of Onset   Coronary artery disease Father    Heart attack Father    Rheum arthritis Mother    Cancer Brother        ? TYPE   Colon cancer Neg Hx    Stomach cancer Neg Hx    Pancreatic cancer Neg Hx      Current Outpatient Medications (Cardiovascular):    isosorbide mononitrate (IMDUR) 30 MG 24 hr tablet, Take 0.5 tablets (15 mg total) by mouth daily.   lisinopril (ZESTRIL) 10 MG tablet, Take 1 tablet (10 mg total) by mouth daily.   metoprolol succinate (TOPROL-XL) 50 MG 24 hr tablet, Take 1 tablet (50 mg total) by mouth daily.   nitroGLYCERIN (NITROSTAT) 0.4 MG SL tablet, Place 1 tablet (0.4 mg total) under the tongue every 5 (five) minutes as needed for chest pain.   rosuvastatin (CRESTOR) 40 MG tablet, TAKE 1 TABLET DAILY   Current Outpatient Medications (Analgesics):    aspirin EC 81 MG tablet, Take 1 tablet (81 mg total) by mouth daily. Swallow whole.  Current Outpatient Medications (Hematological):     clopidogrel (PLAVIX) 75 MG tablet, Take 1 tablet (75 mg total) by mouth daily.  Current Outpatient Medications (Other):    ALPRAZolam (XANAX) 0.25 MG tablet, Take 1 tablet (0.25 mg total) by mouth 2 (two) times daily as needed for anxiety.   Cholecalciferol (VITAMIN D3) 50 MCG (2000 UT) capsule, Take 2,000 Units by mouth daily.   diazepam (VALIUM) 5 MG tablet, Take 5 mg by mouth at bedtime as needed for sedation.   Polyethyl Glycol-Propyl Glycol (SYSTANE ULTRA) 0.4-0.3 % SOLN, Place 1 drop into  both eyes at bedtime.   psyllium (METAMUCIL) 58.6 % packet, Take 1 packet by mouth daily.   tamsulosin (FLOMAX) 0.4 MG CAPS capsule, Take 0.8 mg by mouth every morning.   zolpidem (AMBIEN) 10 MG tablet, TAKE 1 TABLET(10 MG) BY MOUTH AT BEDTIME AS NEEDED FOR SLEEP (Patient taking differently: Take 10 mg by mouth at bedtime.)   Reviewed prior external information including notes and imaging from  primary care provider As well as notes that were available from care everywhere and other healthcare systems.  Past medical history, social, surgical and family history all reviewed in electronic medical record.  No pertanent information unless stated regarding to the chief complaint.   Review of Systems:  No headache, visual changes, nausea, vomiting, diarrhea, constipation, dizziness, abdominal pain, skin rash, fevers, chills, night sweats, weight loss, swollen lymph nodes, body aches, joint swelling, chest pain, shortness of breath, mood changes. POSITIVE muscle aches  Objective  There were no vitals taken for this visit.   General: No apparent distress alert and oriented x3 mood and affect normal, dressed appropriately.  HEENT: Pupils equal, extraocular movements intact  Respiratory: Patient's speak in full sentences and does not appear short of breath  Cardiovascular: No lower extremity edema, non tender, no erythema      Impression and Recommendations:

## 2023-03-23 ENCOUNTER — Ambulatory Visit: Payer: Medicare Other | Attending: Physician Assistant | Admitting: Physician Assistant

## 2023-03-23 ENCOUNTER — Encounter: Payer: Self-pay | Admitting: Physician Assistant

## 2023-03-23 VITALS — BP 122/56 | HR 69 | Ht 70.0 in | Wt 142.8 lb

## 2023-03-23 DIAGNOSIS — Z955 Presence of coronary angioplasty implant and graft: Secondary | ICD-10-CM

## 2023-03-23 DIAGNOSIS — I493 Ventricular premature depolarization: Secondary | ICD-10-CM

## 2023-03-23 DIAGNOSIS — I491 Atrial premature depolarization: Secondary | ICD-10-CM

## 2023-03-23 DIAGNOSIS — I10 Essential (primary) hypertension: Secondary | ICD-10-CM

## 2023-03-23 DIAGNOSIS — E785 Hyperlipidemia, unspecified: Secondary | ICD-10-CM

## 2023-03-23 DIAGNOSIS — E782 Mixed hyperlipidemia: Secondary | ICD-10-CM | POA: Diagnosis not present

## 2023-03-23 DIAGNOSIS — R55 Syncope and collapse: Secondary | ICD-10-CM

## 2023-03-23 NOTE — Patient Instructions (Signed)
Medication Instructions:  Your physician recommends that you continue on your current medications as directed. Please refer to the Current Medication list given to you today.  *If you need a refill on your cardiac medications before your next appointment, please call your pharmacy*  Lab Work: Have your primary care provider draw a lipid panel when you see them. If you have labs (blood work) drawn today and your tests are completely normal, you will receive your results only by: MyChart Message (if you have MyChart) OR A paper copy in the mail If you have any lab test that is abnormal or we need to change your treatment, we will call you to review the results.  Follow-Up: At [facility], you and your health needs are our priority.  As part of our continuing mission to provide you with exceptional heart care, we have created designated Provider Care Teams.  These Care Teams include your primary Cardiologist (physician) and Advanced Practice Providers (APPs -  Physician Assistants and Nurse Practitioners) who all work together to provide you with the care you need, when you need it.  Your next appointment:   6 month(s)  Provider:   Charlton Haws, MD     Other Instructions Increase hydration  Low-Sodium Eating Plan Salt (sodium) helps you keep a healthy balance of fluids in your body. Too much sodium can raise your blood pressure. It can also cause fluid and waste to be held in your body. Your health care provider or dietitian may recommend a low-sodium eating plan if you have high blood pressure (hypertension), kidney disease, liver disease, or heart failure. Eating less sodium can help lower your blood pressure and reduce swelling. It can also protect your heart, liver, and kidneys. What are tips for following this plan? Reading food labels  Check food labels for the amount of sodium per serving. If you eat more than one serving, you must multiply the listed amount by the number  of servings. Choose foods with less than 140 milligrams (mg) of sodium per serving. Avoid foods with 300 mg of sodium or more per serving. Always check how much sodium is in a product, even if the label says "unsalted" or "no salt added." Shopping  Buy products labeled as "low-sodium" or "no salt added." Buy fresh foods. Avoid canned foods and pre-made or frozen meals. Avoid canned, cured, or processed meats. Buy breads that have less than 80 mg of sodium per slice. Cooking  Eat more home-cooked food. Try to eat less restaurant, buffet, and fast food. Try not to add salt when you cook. Use salt-free seasonings or herbs instead of table salt or sea salt. Check with your provider or pharmacist before using salt substitutes. Cook with plant-based oils, such as canola, sunflower, or olive oil. Meal planning When eating at a restaurant, ask if your food can be made with less salt or no salt. Avoid dishes labeled as brined, pickled, cured, or smoked. Avoid dishes made with soy sauce, miso, or teriyaki sauce. Avoid foods that have monosodium glutamate (MSG) in them. MSG may be added to some restaurant food, sauces, soups, bouillon, and canned foods. Make meals that can be grilled, baked, poached, roasted, or steamed. These are often made with less sodium. General information Try to limit your sodium intake to 1,500-2,300 mg each day, or the amount told by your provider. What foods should I eat? Fruits Fresh, frozen, or canned fruit. Fruit juice. Vegetables Fresh or frozen vegetables. "No salt added" canned vegetables. "No  salt added" tomato sauce and paste. Low-sodium or reduced-sodium tomato and vegetable juice. Grains Low-sodium cereals, such as oats, puffed wheat and rice, and shredded wheat. Low-sodium crackers. Unsalted rice. Unsalted pasta. Low-sodium bread. Whole grain breads and whole grain pasta. Meats and other proteins Fresh or frozen meat, poultry, seafood, and fish. These should  have no added salt. Low-sodium canned tuna and salmon. Unsalted nuts. Dried peas, beans, and lentils without added salt. Unsalted canned beans. Eggs. Unsalted nut butters. Dairy Milk. Soy milk. Cheese that is naturally low in sodium, such as ricotta cheese, fresh mozzarella, or Swiss cheese. Low-sodium or reduced-sodium cheese. Cream cheese. Yogurt. Seasonings and condiments Fresh and dried herbs and spices. Salt-free seasonings. Low-sodium mustard and ketchup. Sodium-free salad dressing. Sodium-free light mayonnaise. Fresh or refrigerated horseradish. Lemon juice. Vinegar. Other foods Homemade, reduced-sodium, or low-sodium soups. Unsalted popcorn and pretzels. Low-salt or salt-free chips. The items listed above may not be all the foods and drinks you can have. Talk to a dietitian to learn more. What foods should I avoid? Vegetables Sauerkraut, pickled vegetables, and relishes. Olives. [place] fries. Onion rings. Regular canned vegetables, except low-sodium or reduced-sodium items. Regular canned tomato sauce and paste. Regular tomato and vegetable juice. Frozen vegetables in sauces. Grains Instant hot cereals. Bread stuffing, pancake, and biscuit mixes. Croutons. Seasoned rice or pasta mixes. Noodle soup cups. Boxed or frozen macaroni and cheese. Regular salted crackers. Self-rising flour. Meats and other proteins Meat or fish that is salted, canned, smoked, spiced, or pickled. Precooked or cured meat, such as sausages or meat loaves. Tomasa Blase. Ham. Pepperoni. Hot dogs. Corned beef. Chipped beef. Salt pork. Jerky. Pickled herring, anchovies, and sardines. Regular canned tuna. Salted nuts. Dairy Processed cheese and cheese spreads. Hard cheeses. Cheese curds. Blue cheese. Feta cheese. String cheese. Regular cottage cheese. Buttermilk. Canned milk. Fats and oils Salted butter. Regular margarine. Ghee. Bacon fat. Seasonings and condiments Onion salt, garlic salt, seasoned salt, table salt, and sea  salt. Canned and packaged gravies. Worcestershire sauce. Tartar sauce. Barbecue sauce. Teriyaki sauce. Soy sauce, including reduced-sodium soy sauce. Steak sauce. Fish sauce. Oyster sauce. Cocktail sauce. Horseradish that you find on the shelf. Regular ketchup and mustard. Meat flavorings and tenderizers. Bouillon cubes. Hot sauce. Pre-made or packaged marinades. Pre-made or packaged taco seasonings. Relishes. Regular salad dressings. Salsa. Other foods Salted popcorn and pretzels. Corn chips and puffs. Potato and tortilla chips. Canned or dried soups. Pizza. Frozen entrees and pot pies. The items listed above may not be all the foods and drinks you should avoid. Talk to a dietitian to learn more. This information is not intended to replace advice given to you by your health care provider. Make sure you discuss any questions you have with your health care provider. Document Revised: 05/20/2022 Document Reviewed: 05/20/2022 Elsevier Patient Education  2024 [employer] Heart-Healthy Eating Plan Many factors influence your heart health, including eating and exercise habits. Heart health is also called coronary health. Coronary risk increases with abnormal blood fat (lipid) levels. A heart-healthy eating plan includes limiting unhealthy fats, increasing healthy fats, limiting salt (sodium) intake, and making other diet and lifestyle changes. What is my plan? Your health care provider may recommend that: You limit your fat intake to _________% or less of your total calories each day. You limit your saturated fat intake to _________% or less of your total calories each day. You limit the amount of cholesterol in your diet to less than _________ mg per day. You limit the amount of sodium  in your diet to less than _________ mg per day. What are tips for following this plan? Cooking Cook foods using methods other than frying. Baking, boiling, grilling, and broiling are all good options. Other ways to  reduce fat include: Removing the skin from poultry. Removing all visible fats from meats. Steaming vegetables in water or broth. Meal planning  At meals, imagine dividing your plate into fourths: Fill one-half of your plate with vegetables and green salads. Fill one-fourth of your plate with whole grains. Fill one-fourth of your plate with lean protein foods. Eat 2-4 cups of vegetables per day. One cup of vegetables equals 1 cup (91 g) broccoli or cauliflower florets, 2 medium carrots, 1 large bell pepper, 1 large sweet potato, 1 large tomato, 1 medium white potato, 2 cups (150 g) raw leafy greens. Eat 1-2 cups of fruit per day. One cup of fruit equals 1 small apple, 1 large banana, 1 cup (237 g) mixed fruit, 1 large orange,  cup (82 g) dried fruit, 1 cup (240 mL) 100% fruit juice. Eat more foods that contain soluble fiber. Examples include apples, broccoli, carrots, beans, peas, and barley. Aim to get 25-30 g of fiber per day. Increase your consumption of legumes, nuts, and seeds to 4-5 servings per week. One serving of dried beans or legumes equals  cup (90 g) cooked, 1 serving of nuts is  oz (12 almonds, 24 pistachios, or 7 walnut halves), and 1 serving of seeds equals  oz (8 g). Fats Choose healthy fats more often. Choose monounsaturated and polyunsaturated fats, such as olive and canola oils, avocado oil, flaxseeds, walnuts, almonds, and seeds. Eat more omega-3 fats. Choose salmon, mackerel, sardines, tuna, flaxseed oil, and ground flaxseeds. Aim to eat fish at least 2 times each week. Check food labels carefully to identify foods with trans fats or high amounts of saturated fat. Limit saturated fats. These are found in animal products, such as meats, butter, and cream. Plant sources of saturated fats include palm oil, palm kernel oil, and coconut oil. Avoid foods with partially hydrogenated oils in them. These contain trans fats. Examples are stick margarine, some tub margarines,  cookies, crackers, and other baked goods. Avoid fried foods. General information Eat more home-cooked food and less restaurant, buffet, and fast food. Limit or avoid alcohol. Limit foods that are high in added sugar and simple starches such as foods made using white refined flour (white breads, pastries, sweets). Lose weight if you are overweight. Losing just 5-10% of your body weight can help your overall health and prevent diseases such as diabetes and heart disease. Monitor your sodium intake, especially if you have high blood pressure. Talk with your health care provider about your sodium intake. Try to incorporate more vegetarian meals weekly. What foods should I eat? Fruits All fresh, canned (in natural juice), or frozen fruits. Vegetables Fresh or frozen vegetables (raw, steamed, roasted, or grilled). Green salads. Grains Most grains. Choose whole wheat and whole grains most of the time. Rice and pasta, including brown rice and pastas made with whole wheat. Meats and other proteins Lean, well-trimmed beef, veal, pork, and lamb. Chicken and [place] without skin. All fish and shellfish. Wild duck, rabbit, pheasant, and venison. Egg whites or low-cholesterol egg substitutes. Dried beans, peas, lentils, and tofu. Seeds and most nuts. Dairy Low-fat or nonfat cheeses, including ricotta and mozzarella. Skim or 1% milk (liquid, powdered, or evaporated). Buttermilk made with low-fat milk. Nonfat or low-fat yogurt. Fats and oils Non-hydrogenated (trans-free) margarines.  Vegetable oils, including soybean, sesame, sunflower, olive, avocado, peanut, safflower, corn, canola, and cottonseed. Salad dressings or mayonnaise made with a vegetable oil. Beverages Water (mineral or sparkling). Coffee and tea. Unsweetened ice tea. Diet beverages. Sweets and desserts Sherbet, gelatin, and fruit ice. Small amounts of dark chocolate. Limit all sweets and desserts. Seasonings and condiments All seasonings and  condiments. The items listed above may not be a complete list of foods and beverages you can eat. Contact a dietitian for more options. What foods should I avoid? Fruits Canned fruit in heavy syrup. Fruit in cream or butter sauce. Fried fruit. Limit coconut. Vegetables Vegetables cooked in cheese, cream, or butter sauce. Fried vegetables. Grains Breads made with saturated or trans fats, oils, or whole milk. Croissants. Sweet rolls. Donuts. High-fat crackers, such as cheese crackers and chips. Meats and other proteins Fatty meats, such as hot dogs, ribs, sausage, bacon, rib-eye roast or steak. High-fat deli meats, such as salami and bologna. Caviar. Domestic duck and goose. Organ meats, such as liver. Dairy Cream, sour cream, cream cheese, and creamed cottage cheese. Whole-milk cheeses. Whole or 2% milk (liquid, evaporated, or condensed). Whole buttermilk. Cream sauce or high-fat cheese sauce. Whole-milk yogurt. Fats and oils Meat fat, or shortening. Cocoa butter, hydrogenated oils, palm oil, coconut oil, palm kernel oil. Solid fats and shortenings, including bacon fat, salt pork, lard, and butter. Nondairy cream substitutes. Salad dressings with cheese or sour cream. Beverages Regular sodas and any drinks with added sugar. Sweets and desserts Frosting. Pudding. Cookies. Cakes. Pies. Milk chocolate or white chocolate. Buttered syrups. Full-fat ice cream or ice cream drinks. The items listed above may not be a complete list of foods and beverages to avoid. Contact a dietitian for more information. Summary Heart-healthy meal planning includes limiting unhealthy fats, increasing healthy fats, limiting salt (sodium) intake and making other diet and lifestyle changes. Lose weight if you are overweight. Losing just 5-10% of your body weight can help your overall health and prevent diseases such as diabetes and heart disease. Focus on eating a balance of foods, including fruits and vegetables, low-fat  or nonfat dairy, lean protein, nuts and legumes, whole grains, and heart-healthy oils and fats. This information is not intended to replace advice given to you by your health care provider. Make sure you discuss any questions you have with your health care provider. Document Revised: 06/08/2021 Document Reviewed: 06/08/2021 Elsevier Patient Education  2024 [employer].

## 2023-03-23 NOTE — Progress Notes (Signed)
Cardiology Office Note:  .   Date:  03/23/2023  ID:  [name] [name], DOB 05/28/1932, MRN [id number] PCP: Etta Grandchild, MD  [facility] Providers Cardiologist:  Charlton Haws, MD {  History of Present Illness: .   [name] [name] is a 89 y.o. male with a past medical history of stent to LAD in 2008 with ETT 2013 abnormal with NSVT also with bilateral mild carotid artery disease, hypertension, hyperlipidemia, syncope, insomnia, visual disturbance here for follow-up appointment.   History includes Myoview done 04/2017 which was normal with no ischemia.  Not gated treated PVCs.  Echo 06/25/2015 with LVEF 55 to 60%.  Repeat Myoview 12/21 which was normal with no ischemia with estimated LVEF 53%.  He has had ocular migraines in him going on for 3 to 4 years and sees neurology.  Had MRI/MRA with no abnormality and EEG with no epileptic foci.  Bilateral carotid disease which was mild 1 to 39% on ultrasound 01/11/2014.  Lumbar disc issues with bilateral hip pain and repeat injection.  Also with right AC joint arthritis and for which she is getting injections as well.  Unfortunately, had a syncopal episode October 18, 2020 and started to have some visual changes then low blood pressures in the 80s with presyncope.  Felt weak for couple hours.  Had 2 drinks prior.  Occurred in the evening time.  No chest pain or dyspnea at that time.  Went to sleep and felt fine with no recurrence.  Monitor showed no PAF but a percent burden of atrial ectopy with self-limited runs of SVT.  He had possible angina 06/11/2022 with tightness in his shoulders and back and neck with exertional dissipates with rest.  Reproducible with similar degree of stress.  No rest pain.  Also had lower blood pressure and told to hold Zestril.  Cardiac catheterization was obtained which ultimately showed no changes from prior studies.  Today, he presents with a history of neck and shoulder pain and fatigue, for follow-up after a cardiac  catheterization. The cardiac cath  was prompted by a recurrence of these symptoms, which were initially experienced in January of this year. The patient denies chest pain but reports becoming 'very tired' when walking. The cardiac catheterization was reportedly 'perfect,' and the patient has not been taking Imdur, a vasodilator prescribed for chest pain. No changes on cath since prior studies.  Post-procedure, the patient experienced a leak at the catheterization site, resulting in a loss of 250 milliliters of blood. Since the procedure, the patient reports an absence of fatigue but continues to experience neck pain. Steroid injections administered by a sports medicine specialist have provided some relief.   The patient also reports occasional lightheadedness, rating the severity as a 'one or two' on a scale where ten represents passing out. He admits to not drinking enough water. The patient denies any side effects from his current medications.  Reports no shortness of breath nor dyspnea on exertion. Reports no chest pain, pressure, or tightness. No edema, orthopnea, PND. Reports no palpitations.   Discussed the use of AI scribe software for clinical note transcription with the patient, who gave verbal consent to proceed.   ROS: pertinent ROS in HPI  Studies Reviewed: .       Cardiac cath 03/10/2023 Left Anterior Descending  Prox LAD lesion is 10% stenosed. The lesion was previously treated using a drug eluting stent over 2 years ago.  Dist LAD lesion is 40% stenosed.    Right  Coronary Artery  Vessel is large. There is mild diffuse disease throughout the vessel. This is a large, dominant vessel with mild diffuse plaquing but no significant stenosis. The posterior AV segment is stented with a widely patent stent.    Right Posterior Atrioventricular Artery  Vessel is large in size.  Non-stenotic RPAV lesion was previously treated. Initially, the lesion looked longer, but a portion of this was  vasospasm which resolved with IC NTG.    Intervention   No interventions have been documented.   Coronary Diagrams  Diagnostic Dominance: Right  Intervention      Physical Exam:   VS:  BP (!) 122/56   Pulse 69   Ht 5\' 10"  (1.778 m)   Wt 142 lb 12.8 oz (64.8 kg)   SpO2 97%   BMI 20.49 kg/m    Wt Readings from Last 3 Encounters:  03/23/23 142 lb 12.8 oz (64.8 kg)  03/15/23 143 lb (64.9 kg)  03/10/23 140 lb (63.5 kg)    GEN: Well nourished, well developed in no acute distress NECK: No JVD; No carotid bruits CARDIAC: RRR, no murmurs, rubs, gallops RESPIRATORY:  Clear to auscultation without rales, wheezing or rhonchi  ABDOMEN: Soft, non-tender, non-distended EXTREMITIES:  No edema; No deformity, cath site without hematoma.   ASSESSMENT AND PLAN: .    Neck pain/Fatigue, nonobstructive CAD Recent cardiac catheterization with no significant findings. No current chest pain. Previous symptoms of neck and shoulder pain with fatigue, but fatigue has resolved post-procedure. -Continue current management.  Post-Procedure Complication Experienced a significant bleed from the catheterization site. -No current intervention required. -bleeding has resolved  Neck Pain Persistent despite cardiac catheterization. Recent steroid injections provided some relief. -Continue current management with sports medicine.  Syncope No recent episodes of passing out, occasional light-headedness. -Encouraged to increase water intake to help with transitions. -BP is stable today  Hyperlipidemia LDL well controlled at 51 as of February 2024. -Continue current management. -Annual labs with primary care provider and please fax [place] those results      Dispo: He can follow-up in 6 months with Dr. Eden Emms  Signed, Sharlene Dory, PA-C

## 2023-03-24 NOTE — Progress Notes (Deleted)
[facility] [address] [place] [address] Phone: [phone] Subjective:    I'm seeing this patient by the request  of:  Etta Grandchild, MD  CC:   [id number]  03/15/2023 Bilateral trigger points given today.  Tolerated the procedure well.  Think this is better than giving any medications with patient recently having the difficulty with his chest discomfort.  Workup had been benign no.  If continuing to have difficulty we do need to consider the possibility of advanced imaging of the cervical spine and may be even a CT of the chest.  Continue to be active.  Follow-up again in 6 to 8 weeks     Has had history of cervical radiculitis.  Could be potentially contributing.  Repeat x-rays noted today.  We will also did some trigger point injections.  Hopeful that this will make some improvement.  Patient feels and continuing to have some discomfort.  Recently checked up with a cardiac catheterization of the heart not showing any coronary artery disease.  Follow-up with me again in 6 to 8 weeks otherwise.     Update 03/28/2023 [name] [name] is a 89 y.o. male coming in with complaint of R shoulder and C spine pain. Patient states       Past Medical History:  Diagnosis Date   Anxiety    CAD (coronary artery disease)    a. s/p stent to LAD 2008;  b. abnormal ETT 11/13 with VTach => LHC pLAD 30% ISR, mCFX 30%, dRCA 60-75% which had progressed from previous study in 2010 => FFR of RCA 0.92 => med Rx    Carotid stenosis    dopplers 1/14:  0-39% bilateral ICA stenosis   Colon polyps    Colon polyps    adenomatous   Diverticular disease of colon    Hemorrhoids    external and internal   History of BPH    HTN (hypertension)    Hx of echocardiogram    a. Echo 9/11:  EF 55-60%, normal motion, grade 2 diastolic dysfunction, mild MR, mild LAE, mild RAE, PASP 33.   Hyperlipidemia    IBS (irritable bowel syndrome)    Rhinitis    Stroke, lacunar (HCC)  08/24/2012   Old, 2011 head mri   Past Surgical History:  Procedure Laterality Date   APPENDECTOMY  1956   CORONARY STENT INTERVENTION N/A 06/15/2022   Procedure: CORONARY STENT INTERVENTION;  Surgeon: Corky Crafts, MD;  Location: [facility] INVASIVE CV LAB;  Service: Cardiovascular;  Laterality: N/A;   CORONARY STENT PLACEMENT  2008    Successful PCI of the lesion in the proximal LAD using a Promus    HERNIA REPAIR Right 2005   LEFT HEART CATH AND CORONARY ANGIOGRAPHY N/A 06/15/2022   Procedure: LEFT HEART CATH AND CORONARY ANGIOGRAPHY;  Surgeon: Corky Crafts, MD;  Location: [facility] INVASIVE CV LAB;  Service: Cardiovascular;  Laterality: N/A;   LEFT HEART CATH AND CORONARY ANGIOGRAPHY N/A 03/10/2023   Procedure: LEFT HEART CATH AND CORONARY ANGIOGRAPHY;  Surgeon: Tonny Bollman, MD;  Location: [facility] INVASIVE CV LAB;  Service: Cardiovascular;  Laterality: N/A;   NASAL SINUS SURGERY  1975   PERCUTANEOUS CORONARY STENT INTERVENTION (PCI-S) N/A 05/03/2012   Procedure: PERCUTANEOUS CORONARY STENT INTERVENTION (PCI-S);  Surgeon: Tonny Bollman, MD;  Location: [facility] CATH LAB;  Service: Cardiovascular;  Laterality: N/A;   Promus DES ok for 3T MRI  2008   Social History   Socioeconomic History   Marital  status: Married    Spouse name: Not on file   Number of children: 3   Years of education: 16   Highest education level: Not on file  Occupational History   Occupation: businessman, Financial planner: RETIRED  Tobacco Use   Smoking status: Never    Passive exposure: Never   Smokeless tobacco: Never  Vaping Use   Vaping status: Never Used  Substance and Sexual Activity   Alcohol use: Not Currently    Comment: 4-6 drinks/week   Drug use: No   Sexual activity: Yes    Partners: Female  Other Topics Concern   Not on file  Social History Narrative   HSG, Engineer, 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 He has two daughters, 1 in [place] 1 in [place] (Dec '12) and a son who works with him. Several grandchildren.Reports that he spends a good deal of time at his home in [place] which he greatly enjoys and his home in the [place]. Enjoys driving his BMW [place] when [place].      ACP - Yes CPR, yes for short-term mechanical ventilation for reversible disease. Recommended [email or web address] for consideration.   Social Determinants of Health   Financial Resource Strain: Low Risk  (11/16/2022)   Overall Financial Resource Strain (CARDIA)    Difficulty of Paying Living Expenses: Not hard at all  Food Insecurity: No Food Insecurity (11/16/2022)   Hunger Vital Sign    Worried About Running Out of Food in the Last Year: Never true    Ran Out of Food in the Last Year: Never true  Transportation Needs: No Transportation Needs (11/16/2022)   PRAPARE - Administrator, Civil Service (Medical): No    Lack of Transportation (Non-Medical): No  Physical Activity: Sufficiently Active (11/16/2022)   Exercise Vital Sign    Days of Exercise per Week: 6 days    Minutes of Exercise per Session: 30 min  Stress: No Stress Concern Present (11/16/2022)   [name] of Occupational Health - Occupational Stress Questionnaire    Feeling of Stress : Not at all  Social Connections: Unknown (11/16/2022)   Social Connection and Isolation Panel [NHANES]    Frequency of Communication with Friends and Family: More than three times a week    Frequency of Social Gatherings with Friends and Family: Once a week    Attends Religious Services: Not on Marketing executive or Organizations: No    Attends Engineer, structural: 1 to 4 times per year    Marital Status: Married   No Known Allergies Family History  Problem Relation Age of Onset   Coronary artery disease Father    Heart attack Father    Rheum arthritis Mother    Cancer Brother        ? TYPE    Colon cancer Neg Hx    Stomach cancer Neg Hx    Pancreatic cancer Neg Hx      Current Outpatient Medications (Cardiovascular):    isosorbide mononitrate (IMDUR) 30 MG 24 hr tablet, Take 0.5 tablets (15 mg total) by mouth daily. (Patient not taking: Reported on 03/23/2023)   lisinopril (ZESTRIL) 10 MG tablet, Take 1 tablet (10 mg total) by mouth daily.   metoprolol succinate (TOPROL-XL) 50 MG 24 hr tablet, Take 1 tablet (50 mg total) by mouth daily.   nitroGLYCERIN (NITROSTAT)  0.4 MG SL tablet, Place 1 tablet (0.4 mg total) under the tongue every 5 (five) minutes as needed for chest pain.   rosuvastatin (CRESTOR) 40 MG tablet, TAKE 1 TABLET DAILY   Current Outpatient Medications (Analgesics):    aspirin EC 81 MG tablet, Take 1 tablet (81 mg total) by mouth daily. Swallow whole.  Current Outpatient Medications (Hematological):    clopidogrel (PLAVIX) 75 MG tablet, Take 1 tablet (75 mg total) by mouth daily.  Current Outpatient Medications (Other):    Cholecalciferol (VITAMIN D3) 50 MCG (2000 UT) capsule, Take 2,000 Units by mouth daily.   Polyethyl Glycol-Propyl Glycol (SYSTANE ULTRA) 0.4-0.3 % SOLN, Place 1 drop into both eyes at bedtime.   psyllium (METAMUCIL) 58.6 % packet, Take 1 packet by mouth daily.   tamsulosin (FLOMAX) 0.4 MG CAPS capsule, Take 0.8 mg by mouth every morning.   Reviewed prior external information including notes and imaging from  primary care provider As well as notes that were available from care everywhere and other healthcare systems.  Past medical history, social, surgical and family history all reviewed in electronic medical record.  No pertanent information unless stated regarding to the chief complaint.   Review of Systems:  No headache, visual changes, nausea, vomiting, diarrhea, constipation, dizziness, abdominal pain, skin rash, fevers, chills, night sweats, weight loss, swollen lymph nodes, body aches, joint swelling, chest pain, shortness of breath,  mood changes. POSITIVE muscle aches  Objective  There were no vitals taken for this visit.   General: No apparent distress alert and oriented x3 mood and affect normal, dressed appropriately.  HEENT: Pupils equal, extraocular movements intact  Respiratory: Patient's speak in full sentences and does not appear short of breath  Cardiovascular: No lower extremity edema, non tender, no erythema      Impression and Recommendations:

## 2023-03-28 ENCOUNTER — Ambulatory Visit: Payer: Medicare Other | Admitting: Family Medicine

## 2023-03-29 ENCOUNTER — Ambulatory Visit: Payer: Medicare Other | Admitting: Cardiovascular Disease

## 2023-03-31 ENCOUNTER — Ambulatory Visit: Payer: Medicare Other | Admitting: Nurse Practitioner

## 2023-04-01 ENCOUNTER — Encounter: Payer: Self-pay | Admitting: Family Medicine

## 2023-04-12 ENCOUNTER — Other Ambulatory Visit: Payer: Self-pay | Admitting: Internal Medicine

## 2023-04-12 DIAGNOSIS — F5104 Psychophysiologic insomnia: Secondary | ICD-10-CM

## 2023-04-13 ENCOUNTER — Other Ambulatory Visit: Payer: Self-pay | Admitting: Internal Medicine

## 2023-04-13 ENCOUNTER — Telehealth: Payer: Self-pay | Admitting: Internal Medicine

## 2023-04-13 ENCOUNTER — Other Ambulatory Visit: Payer: Self-pay

## 2023-04-13 DIAGNOSIS — F5104 Psychophysiologic insomnia: Secondary | ICD-10-CM | POA: Insufficient documentation

## 2023-04-13 MED ORDER — ZOLPIDEM TARTRATE 10 MG PO TABS: 10.0000 mg | ORAL_TABLET | Freq: Every evening | ORAL | 0 refills | Status: DC | PRN

## 2023-04-13 NOTE — Telephone Encounter (Signed)
Medication is not on patient medication list for me to send an actual refill. Please advise.

## 2023-04-13 NOTE — Telephone Encounter (Signed)
Patient has scheduled an appointment for Dr. Yetta Barre' first opening on 05/12/23. He would like to know if he can get enough medication to last until his appointment.   Prescription Request  04/13/2023  LOV: 12/02/2022  What is the name of the medication or equipment? zolpidem (AMBIEN) 10 MG tablet   Have you contacted your pharmacy to request a refill? Yes   Which pharmacy would you like this sent to?  [id number] [place], [place] - [address] AT [facility] [address] [facility] [address] [facility] [address] [place] [address] Phone: [phone] Fax: [phone]    Patient notified that their request is being sent to the clinical staff for review and that they should receive a response within 2 business days.   Please advise at Mobile [phone] (mobile)

## 2023-04-18 ENCOUNTER — Other Ambulatory Visit: Payer: Self-pay | Admitting: Internal Medicine

## 2023-04-18 DIAGNOSIS — F5104 Psychophysiologic insomnia: Secondary | ICD-10-CM

## 2023-04-18 MED ORDER — ZOLPIDEM TARTRATE 10 MG PO TABS: 10.0000 mg | ORAL_TABLET | Freq: Every evening | ORAL | 0 refills | Status: DC | PRN

## 2023-04-18 NOTE — Telephone Encounter (Signed)
Please advise 

## 2023-04-18 NOTE — Telephone Encounter (Signed)
Documentation states that it was sent on 11/27 but pharmacy states that they can not fill it - Will you please resend this prescription to [facility]

## 2023-05-02 ENCOUNTER — Other Ambulatory Visit: Payer: Self-pay | Admitting: Cardiovascular Disease

## 2023-05-12 ENCOUNTER — Ambulatory Visit (INDEPENDENT_AMBULATORY_CARE_PROVIDER_SITE_OTHER): Payer: Medicare Other | Admitting: Internal Medicine

## 2023-05-12 ENCOUNTER — Encounter: Payer: Self-pay | Admitting: Internal Medicine

## 2023-05-12 VITALS — BP 140/88 | HR 73 | Temp 97.5°F | Resp 16 | Ht 70.0 in | Wt 144.0 lb

## 2023-05-12 DIAGNOSIS — F5104 Psychophysiologic insomnia: Secondary | ICD-10-CM | POA: Diagnosis not present

## 2023-05-12 DIAGNOSIS — I10 Essential (primary) hypertension: Secondary | ICD-10-CM | POA: Diagnosis not present

## 2023-05-12 DIAGNOSIS — K219 Gastro-esophageal reflux disease without esophagitis: Secondary | ICD-10-CM

## 2023-05-12 MED ORDER — ZOLPIDEM TARTRATE 10 MG PO TABS: 10.0000 mg | ORAL_TABLET | Freq: Every evening | ORAL | 0 refills | Status: DC | PRN

## 2023-05-12 NOTE — Patient Instructions (Signed)
Insomnia Insomnia is a sleep disorder that makes it difficult to fall asleep or stay asleep. Insomnia can cause fatigue, low energy, difficulty concentrating, mood swings, and poor performance at work or school. There are three different ways to classify insomnia: Difficulty falling asleep. Difficulty staying asleep. Waking up too early in the morning. Any type of insomnia can be long-term (chronic) or short-term (acute). Both are common. Short-term insomnia usually lasts for 3 months or less. Chronic insomnia occurs at least three times a week for longer than 3 months. What are the causes? Insomnia may be caused by another condition, situation, or substance, such as: Having certain mental health conditions, such as anxiety and depression. Using caffeine, alcohol, tobacco, or drugs. Having gastrointestinal conditions, such as gastroesophageal reflux disease (GERD). Having certain medical conditions. These include: Asthma. Alzheimer's disease. Stroke. Chronic pain. An overactive thyroid gland (hyperthyroidism). Other sleep disorders, such as restless legs syndrome and sleep apnea. Menopause. Sometimes, the cause of insomnia may not be known. What increases the risk? Risk factors for insomnia include: Gender. Females are affected more often than males. Age. Insomnia is more common as people get older. Stress and certain medical and mental health conditions. Lack of exercise. Having an irregular work schedule. This may include working night shifts and traveling between different time zones. What are the signs or symptoms? If you have insomnia, the main symptom is having trouble falling asleep or having trouble staying asleep. This may lead to other symptoms, such as: Feeling tired or having low energy. Feeling nervous about going to sleep. Not feeling rested in the morning. Having trouble concentrating. Feeling irritable, anxious, or depressed. How is this diagnosed? This condition  may be diagnosed based on: Your symptoms and medical history. Your health care provider may ask about: Your sleep habits. Any medical conditions you have. Your mental health. A physical exam. How is this treated? Treatment for insomnia depends on the cause. Treatment may focus on treating an underlying condition that is causing the insomnia. Treatment may also include: Medicines to help you sleep. Counseling or therapy. Lifestyle adjustments to help you sleep better. Follow these instructions at home: Eating and drinking  Limit or avoid alcohol, caffeinated beverages, and products that contain nicotine and tobacco, especially close to bedtime. These can disrupt your sleep. Do not eat a large meal or eat spicy foods right before bedtime. This can lead to digestive discomfort that can make it hard for you to sleep. Sleep habits  Keep a sleep diary to help you and your health care provider figure out what could be causing your insomnia. Write down: When you sleep. When you wake up during the night. How well you sleep and how rested you feel the next day. Any side effects of medicines you are taking. What you eat and drink. Make your bedroom a dark, comfortable place where it is easy to fall asleep. Put up shades or blackout curtains to block light from outside. Use a white noise machine to block noise. Keep the temperature cool. Limit screen use before bedtime. This includes: Not watching TV. Not using your smartphone, tablet, or computer. Stick to a routine that includes going to bed and waking up at the same times every day and night. This can help you fall asleep faster. Consider making a quiet activity, such as reading, part of your nighttime routine. Try to avoid taking naps during the day so that you sleep better at night. Get out of bed if you are still awake after   15 minutes of trying to sleep. Keep the lights down, but try reading or doing a quiet activity. When you feel  sleepy, go back to bed. General instructions Take over-the-counter and prescription medicines only as told by your health care provider. Exercise regularly as told by your health care provider. However, avoid exercising in the hours right before bedtime. Use relaxation techniques to manage stress. Ask your health care provider to suggest some techniques that may work well for you. These may include: Breathing exercises. Routines to release muscle tension. Visualizing peaceful scenes. Make sure that you drive carefully. Do not drive if you feel very sleepy. Keep all follow-up visits. This is important. Contact a health care provider if: You are tired throughout the day. You have trouble in your daily routine due to sleepiness. You continue to have sleep problems, or your sleep problems get worse. Get help right away if: You have thoughts about hurting yourself or someone else. Get help right away if you feel like you may hurt yourself or others, or have thoughts about taking your own life. Go to your nearest emergency room or: Call 911. Call the National Suicide Prevention Lifeline at 1-800-273-8255 or 988. This is open 24 hours a day. Text the Crisis Text Line at 741741. Summary Insomnia is a sleep disorder that makes it difficult to fall asleep or stay asleep. Insomnia can be long-term (chronic) or short-term (acute). Treatment for insomnia depends on the cause. Treatment may focus on treating an underlying condition that is causing the insomnia. Keep a sleep diary to help you and your health care provider figure out what could be causing your insomnia. This information is not intended to replace advice given to you by your health care provider. Make sure you discuss any questions you have with your health care provider. Document Revised: 04/13/2021 Document Reviewed: 04/13/2021 Elsevier Patient Education  2024 Elsevier Inc.  

## 2023-05-12 NOTE — Progress Notes (Signed)
Subjective:  Patient ID: [name] [name], male    DOB: Sep 29, 1932  Age: 89 y.o. MRN: [id number]  CC: Hypertension   HPI [name] TEEPLES presents for f/up ---  Discussed the use of AI scribe software for clinical note transcription with the patient, who gave verbal consent to proceed.  History of Present Illness   The patient, with a history of cardiac disease, presents for a follow-up visit after a recent cardiac catheterization. He reports feeling well and denies any cardiac symptoms. He does, however, report experiencing gastroesophageal reflux, which he attributes to alcohol consumption. He estimates his alcohol intake to be approximately four drinks per week, typically consumed on weekends. He denies any dysphagia, dizziness, lightheadedness, chest pain, or shortness of breath. His weight has remained stable.  The patient also reports alternating diarrhea and constipation, with no identifiable triggers or dietary associations. He has an upcoming appointment with a gastroenterologist to further evaluate these symptoms. He denies any hematochezia or melena.  The patient is active, engaging in regular long walks and maintaining a regular sleep schedule. 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 He expresses gratitude for his current state of health and well-being.       Outpatient Medications Prior to Visit  Medication Sig Dispense Refill   aspirin EC 81 MG tablet Take 1 tablet (81 mg total) by mouth daily. Swallow whole. (Patient taking differently: Take 81 mg by mouth as needed. Swallow whole.) 30 tablet 5   Cholecalciferol (VITAMIN D3) 50 MCG (2000 UT) capsule Take 2,000 Units by mouth daily.     clopidogrel (PLAVIX) 75 MG tablet Take 1 tablet (75 mg total) by mouth daily. 90 tablet 3   isosorbide mononitrate (IMDUR) 30 MG 24 hr tablet Take 0.5 tablets (15 mg total) by mouth daily. 90 tablet 3   lisinopril (ZESTRIL) 10 MG tablet Take 1 tablet (10  mg total) by mouth daily. 90 tablet 3   metoprolol succinate (TOPROL-XL) 50 MG 24 hr tablet TAKE 1 TABLET DAILY 90 tablet 3   nitroGLYCERIN (NITROSTAT) 0.4 MG SL tablet Place 1 tablet (0.4 mg total) under the tongue every 5 (five) minutes as needed for chest pain. (Patient taking differently: Place 0.4 mg under the tongue every 5 (five) minutes as needed for chest pain. As needed, never had to take, but does have some if needed) 100 tablet 3   Polyethyl Glycol-Propyl Glycol (SYSTANE ULTRA) 0.4-0.3 % SOLN Place 1 drop into both eyes at bedtime.     psyllium (METAMUCIL) 58.6 % packet Take 1 packet by mouth daily.     rosuvastatin (CRESTOR) 40 MG tablet TAKE 1 TABLET DAILY 90 tablet 3   tamsulosin (FLOMAX) 0.4 MG CAPS capsule Take 0.8 mg by mouth every morning.     zolpidem (AMBIEN) 10 MG tablet Take 1 tablet (10 mg total) by mouth at bedtime as needed for sleep. 90 tablet 0   No facility-administered medications prior to visit.    ROS Review of Systems  Constitutional:  Negative for appetite change, chills, diaphoresis and fatigue.  HENT: Negative.  Negative for trouble swallowing.   Eyes: Negative.   Respiratory: Negative.  Negative for cough, chest tightness, shortness of breath and wheezing.   Cardiovascular:  Negative for chest pain, palpitations and leg swelling.  Gastrointestinal:  Negative for abdominal pain, constipation, diarrhea, nausea and vomiting.  Genitourinary:  Negative for difficulty urinating and hematuria.  Musculoskeletal: Negative.  Negative for arthralgias and myalgias.  Skin:  Negative.   Neurological:  Negative for dizziness and weakness.  Hematological:  Negative for adenopathy. Does not bruise/bleed easily.  Psychiatric/Behavioral: Negative.      Objective:  BP (!) 140/88 (BP Location: Left Arm, Patient Position: Sitting, Cuff Size: Normal)   Pulse 73   Temp (!) 97.5 F (36.4 C) (Oral)   Resp 16   Ht 5\' 10"  (1.778 m)   Wt 144 lb (65.3 kg)   SpO2 95%   BMI  20.66 kg/m   BP Readings from Last 3 Encounters:  05/12/23 (!) 140/88  03/23/23 (!) 122/56  03/15/23 132/68    Wt Readings from Last 3 Encounters:  05/12/23 144 lb (65.3 kg)  03/23/23 142 lb 12.8 oz (64.8 kg)  03/15/23 143 lb (64.9 kg)    Physical Exam Vitals reviewed.  Constitutional:      Appearance: Normal appearance.  HENT:     Nose: Nose normal.     Mouth/Throat:     Mouth: Mucous membranes are moist.  Eyes:     General: No scleral icterus.    Conjunctiva/sclera: Conjunctivae normal.  Cardiovascular:     Rate and Rhythm: Normal rate and regular rhythm.     Heart sounds: No murmur heard.    No friction rub. No gallop.  Pulmonary:     Effort: Pulmonary effort is normal.     Breath sounds: No stridor. No wheezing, rhonchi or rales.  Abdominal:     General: Abdomen is flat.     Palpations: There is no mass.     Tenderness: There is no abdominal tenderness. There is no guarding.     Hernia: No hernia is present.  Musculoskeletal:        General: Normal range of motion.     Cervical back: Neck supple.     Right lower leg: No edema.     Left lower leg: No edema.  Skin:    General: Skin is warm and dry.  Neurological:     General: No focal deficit present.     Mental Status: He is alert. Mental status is at baseline.  Psychiatric:        Mood and Affect: Mood normal.        Behavior: Behavior normal.     Lab Results  Component Value Date   WBC 6.8 03/08/2023   HGB 13.2 03/08/2023   HCT 39.5 03/08/2023   PLT 169 03/08/2023   GLUCOSE 100 (H) 03/08/2023   CHOL 137 07/02/2022   TRIG 141 07/02/2022   HDL 62 07/02/2022   LDLDIRECT 132.2 03/10/2007   LDLCALC 51 07/02/2022   ALT 17 07/02/2022   AST 21 07/02/2022   NA 140 03/08/2023   K 4.4 03/08/2023   CL 108 (H) 03/08/2023   CREATININE 1.27 03/08/2023   BUN 21 03/08/2023   CO2 20 03/08/2023   TSH 3.83 12/02/2022   PSA 1.10 12/02/2022   INR 1.0 04/27/2012    CARDIAC CATHETERIZATION Result Date:  03/11/2023   Dist LAD lesion is 40% stenosed.   Prox LAD lesion is 10% stenosed.   Non-stenotic RPAV lesion was previously treated. 1.  Continued patency of the stented segments in the LAD and posterolateral branch of the RCA 2.  Nonobstructive disease with no change compared to the previous heart catheterization earlier this year 3.  Normal LVEDP Recommend: Suspect noncardiac chest pain, continue medical therapy for CAD    Assessment & Plan:   Essential hypertension - His BP is adequately well controlled.  Psychophysiological insomnia -     Zolpidem Tartrate; Take 1 tablet (10 mg total) by mouth at bedtime as needed for sleep.  Dispense: 90 tablet; Refill: 0  Gastroesophageal reflux disease without esophagitis- Sx's are well controlled.     Follow-up: Return in about 6 months (around 11/10/2023).  Sanda Linger, MD

## 2023-05-16 ENCOUNTER — Other Ambulatory Visit: Payer: Self-pay | Admitting: Internal Medicine

## 2023-05-16 DIAGNOSIS — F5104 Psychophysiologic insomnia: Secondary | ICD-10-CM

## 2023-05-16 NOTE — Telephone Encounter (Signed)
Copied from CRM [id number]. Topic: Clinical - Medication Refill >> May 16, 2023 10:39 AM Leavy Cella D wrote: Most Recent Primary Care Visit:  Provider: Etta Grandchild  Department: [facility]  Visit Type: OFFICE VISIT  Date: 05/12/2023  Medication: zolpidem (AMBIEN) 10 MG tablet  Has the patient contacted their pharmacy? Yes (Agent: If no, request that the patient contact the pharmacy for the refill. If patient does not wish to contact the pharmacy document the reason why and proceed with request.) (Agent: If yes, when and what did the pharmacy advise?)  Is this the correct pharmacy for this prescription? Yes If no, delete pharmacy and type the correct one.  This is the patient's preferred pharmacy:   [facility] #[id number] [place], [place] - [address] AT [facility] OF [facility] [address] & [facility] [address] [place] [address] Phone: [phone] Fax: [phone]   Has the prescription been filled recently? No  Is the patient out of the medication? Yes  Has the patient been seen for an appointment in the last year OR does the patient have an upcoming appointment? Yes  Can we respond through MyChart? Yes  Agent: Please be advised that Rx refills may take up to 3 business days. We ask that you follow-up with your pharmacy.

## 2023-05-23 ENCOUNTER — Other Ambulatory Visit: Payer: Self-pay | Admitting: Internal Medicine

## 2023-05-23 DIAGNOSIS — F411 Generalized anxiety disorder: Secondary | ICD-10-CM

## 2023-05-30 DIAGNOSIS — H01005 Unspecified blepharitis left lower eyelid: Secondary | ICD-10-CM | POA: Diagnosis not present

## 2023-05-30 DIAGNOSIS — H01002 Unspecified blepharitis right lower eyelid: Secondary | ICD-10-CM | POA: Diagnosis not present

## 2023-06-01 ENCOUNTER — Encounter: Payer: Self-pay | Admitting: Internal Medicine

## 2023-06-01 ENCOUNTER — Ambulatory Visit: Payer: Medicare Other | Admitting: Internal Medicine

## 2023-06-01 VITALS — BP 110/60 | HR 76 | Ht 68.25 in | Wt 146.2 lb

## 2023-06-01 DIAGNOSIS — R194 Change in bowel habit: Secondary | ICD-10-CM | POA: Diagnosis not present

## 2023-06-01 NOTE — Progress Notes (Signed)
 HISTORY OF PRESENT ILLNESS:  [name] [name] is a 89 y.o. male with multiple medical problems as listed below who presents today regarding bowel habit issues.  I saw him regarding the same August 29, 2019.  At that time the patient was concerned regarding the number of bowel movements he had, but felt well.  See that dictation.  He continues with the same.  Patient tells me that he may have a bowel movement once every few days.  He feels well.  He is concerned that this frequency is inadequate.  As such she will take MiraLAX or Dulcolax when he has not had a bowel movement in a few days.  Thereafter, particularly, he has diarrhea.  He finds himself in this cycle.  Again, he is clear that he does not feel any distress when he has gone several days without a bowel movement, and only concerns him that this may not be healthy.  His last colonoscopy was 2015.  Most recent blood work from October 2024 shows a normal hemoglobin of 13.2.  He continues to work and be active  REVIEW OF SYSTEMS:  All non-GI ROS negative. Past Medical History:  Diagnosis Date   Anxiety    CAD (coronary artery disease)    a. s/p stent to LAD 2008;  b. abnormal ETT 11/13 with VTach => LHC pLAD 30% ISR, mCFX 30%, dRCA 60-75% which had progressed from previous study in 2010 => FFR of RCA 0.92 => med Rx    Carotid stenosis    dopplers 1/14:  0-39% bilateral ICA stenosis   Colon polyps    Colon polyps    adenomatous   Diverticular disease of colon    Hemorrhoids    external and internal   History of BPH    HTN (hypertension)    Hx of echocardiogram    a. Echo 9/11:  EF 55-60%, normal motion, grade 2 diastolic dysfunction, mild MR, mild LAE, mild RAE, PASP 33.   Hyperlipidemia    IBS (irritable bowel syndrome)    Rhinitis    Stroke, lacunar (HCC) 08/24/2012   Old, 2011 head mri    Past Surgical History:  Procedure Laterality Date   APPENDECTOMY  05/17/1954   CORONARY STENT INTERVENTION N/A 06/15/2022   Procedure:  CORONARY STENT INTERVENTION;  Surgeon: Lucendia Rusk, MD;  Location: [facility] INVASIVE CV LAB;  Service: Cardiovascular;  Laterality: N/A;   CORONARY STENT PLACEMENT  05/17/2006    Successful PCI of the lesion in the proximal LAD using a Promus    HERNIA REPAIR Right 05/18/2003   LEFT HEART CATH AND CORONARY ANGIOGRAPHY N/A 06/15/2022   Procedure: LEFT HEART CATH AND CORONARY ANGIOGRAPHY;  Surgeon: Lucendia Rusk, MD;  [facility]: [facility] INVASIVE CV LAB;  Service: Cardiovascular;  Laterality: N/A;   LEFT HEART CATH AND CORONARY ANGIOGRAPHY N/A 03/10/2023   Procedure: LEFT HEART CATH AND CORONARY ANGIOGRAPHY;  Surgeon: Arnoldo Lapping, MD;  Location: [facility] INVASIVE CV LAB;  Service: Cardiovascular;  Laterality: N/A;   NASAL SINUS SURGERY  05/17/1973   PERCUTANEOUS CORONARY STENT INTERVENTION (PCI-S) N/A 05/03/2012   Procedure: PERCUTANEOUS CORONARY STENT INTERVENTION (PCI-S);  Surgeon: Arnoldo Lapping, MD;  Location: [facility] CATH LAB;  Service: Cardiovascular;  Laterality: N/A;   Promus DES ok for 3T MRI  05/17/2006    Social History [name] [name]  reports that he has never smoked. He has never been exposed to tobacco smoke. He has never used smokeless tobacco. He reports current alcohol use. He reports that  he does not use drugs.  family history includes Cancer in his brother; Coronary artery disease in his father; Heart attack in his father; Rheum arthritis in his mother.  No Known Allergies     PHYSICAL EXAMINATION: Vital signs: BP 110/60 (BP Location: Left Arm, Patient Position: Sitting, Cuff Size: Normal)   Pulse 76   Ht 5' 8.25" (1.734 m)   Wt 146 lb 4 oz (66.3 kg)   BMI 22.07 kg/m   Constitutional: generally well-appearing, no acute distress Psychiatric: alert and oriented x3, cooperative Eyes: Anicteric Abdomen: Not examined Extremities: Relevant abnormalities on visible extremities  Skin: no lesions on visible extremities Neuro: No gross deficits  ASSESSMENT:  1.  Infrequent  bowel movements without symptoms.  This is not a clinical problem   PLAN:  1.  Reassurance 2.  It is okay if he wants to use laxative choice on demand for what he perceives constipation.  He understands that this may result in a side effect of diarrhea. 3.  Follow-up as needed

## 2023-06-01 NOTE — Patient Instructions (Signed)
 Please follow up as needed.  _______________________________________________________  If your blood pressure at your visit was 140/90 or greater, please contact your primary care physician to follow up on this.  _______________________________________________________  If you are age 88 or older, your body mass index should be between 23-30. Your Body mass index is 22.07 kg/m. If this is out of the aforementioned range listed, please consider follow up with your Primary Care Provider.  If you are age 34 or younger, your body mass index should be between 19-25. Your Body mass index is 22.07 kg/m. If this is out of the aformentioned range listed, please consider follow up with your Primary Care Provider.   ________________________________________________________  The [facility] providers would like to encourage you to use MYCHART to communicate with providers for non-urgent requests or questions.  Due to long hold times on the telephone, sending your provider a message by [facility] may be a faster and more efficient way to get a response.  Please allow 48 business hours for a response.  Please remember that this is for non-urgent requests.  _______________________________________________________

## 2023-06-26 ENCOUNTER — Other Ambulatory Visit: Payer: Self-pay | Admitting: Nurse Practitioner

## 2023-07-05 DIAGNOSIS — R351 Nocturia: Secondary | ICD-10-CM | POA: Diagnosis not present

## 2023-07-05 DIAGNOSIS — R3915 Urgency of urination: Secondary | ICD-10-CM | POA: Diagnosis not present

## 2023-07-11 DIAGNOSIS — H10501 Unspecified blepharoconjunctivitis, right eye: Secondary | ICD-10-CM | POA: Diagnosis not present

## 2023-07-11 DIAGNOSIS — H0100A Unspecified blepharitis right eye, upper and lower eyelids: Secondary | ICD-10-CM | POA: Diagnosis not present

## 2023-07-11 DIAGNOSIS — H0100B Unspecified blepharitis left eye, upper and lower eyelids: Secondary | ICD-10-CM | POA: Diagnosis not present

## 2023-07-11 DIAGNOSIS — H00012 Hordeolum externum right lower eyelid: Secondary | ICD-10-CM | POA: Diagnosis not present

## 2023-07-22 DIAGNOSIS — L814 Other melanin hyperpigmentation: Secondary | ICD-10-CM | POA: Diagnosis not present

## 2023-07-22 DIAGNOSIS — D485 Neoplasm of uncertain behavior of skin: Secondary | ICD-10-CM | POA: Diagnosis not present

## 2023-07-22 DIAGNOSIS — L821 Other seborrheic keratosis: Secondary | ICD-10-CM | POA: Diagnosis not present

## 2023-07-22 DIAGNOSIS — Z85820 Personal history of malignant melanoma of skin: Secondary | ICD-10-CM | POA: Diagnosis not present

## 2023-07-22 DIAGNOSIS — L570 Actinic keratosis: Secondary | ICD-10-CM | POA: Diagnosis not present

## 2023-07-22 DIAGNOSIS — Z85828 Personal history of other malignant neoplasm of skin: Secondary | ICD-10-CM | POA: Diagnosis not present

## 2023-07-27 ENCOUNTER — Encounter: Payer: Self-pay | Admitting: Cardiovascular Disease

## 2023-07-27 MED ORDER — ROSUVASTATIN CALCIUM 40 MG PO TABS: 40.0000 mg | ORAL_TABLET | Freq: Every day | ORAL | 3 refills | Status: AC

## 2023-08-10 ENCOUNTER — Other Ambulatory Visit: Payer: Self-pay | Admitting: Internal Medicine

## 2023-08-10 DIAGNOSIS — F5104 Psychophysiologic insomnia: Secondary | ICD-10-CM

## 2023-08-10 NOTE — Telephone Encounter (Signed)
 Copied from CRM [id number]. Topic: Clinical - Medication Refill >> Aug 10, 2023 11:58 AM Drema Balzarine wrote: Most Recent Primary Care Visit:  Provider: Etta Grandchild  Department: [facility]  Visit Type: OFFICE VISIT  Date: 05/12/2023  Medication: zolpidem   Has the patient contacted their pharmacy?   Yes, PATIENT IS GOING OUT OF TOWN 08/25/23-09/05/23 AND WANTS TO KNOW IF DR. Yetta Barre WOULD LIKE TO SEE HIM BEFORE REFILL?  (Agent: If no, request that the patient contact the pharmacy for the refill. If patient does not wish to contact the pharmacy document the reason why and proceed with request.) (Agent: If yes, when and what did the pharmacy advise?)  Is this the correct pharmacy for this prescription? Yes If no, delete pharmacy and type the correct one.  This is the patient's preferred pharmacy:    [id number] [place], [place] - [address] AT [facility] OF [facility] [facility] [address] & [facility] [address] [place] [address] Phone: [phone] Fax: [phone]   Has the prescription been filled recently? Yes  Is the patient out of the medication? Almost, but PATIENT IS GOING OUT OF TOWN 08/25/23-09/05/23 AND WANTS TO KNOW IF DR. Yetta Barre WOULD LIKE TO SEE HIM BEFORE REFILL?  Has the patient been seen for an appointment in the last year OR does the patient have an upcoming appointment? Yes  Can we respond through MyChart? Yes  Agent: Please be advised that Rx refills may take up to 3 business days. We ask that you follow-up with your pharmacy.

## 2023-08-11 DIAGNOSIS — H0100A Unspecified blepharitis right eye, upper and lower eyelids: Secondary | ICD-10-CM | POA: Diagnosis not present

## 2023-08-11 DIAGNOSIS — H0100B Unspecified blepharitis left eye, upper and lower eyelids: Secondary | ICD-10-CM | POA: Diagnosis not present

## 2023-08-11 MED ORDER — ZOLPIDEM TARTRATE 10 MG PO TABS: 10.0000 mg | ORAL_TABLET | Freq: Every evening | ORAL | 0 refills | Status: DC | PRN

## 2023-09-05 ENCOUNTER — Other Ambulatory Visit: Payer: Self-pay

## 2023-09-05 MED ORDER — CLOPIDOGREL BISULFATE 75 MG PO TABS: 75.0000 mg | ORAL_TABLET | Freq: Every day | ORAL | 3 refills | Status: AC

## 2023-09-05 NOTE — Progress Notes (Signed)
 Patient needs refill on Plavix  [facility]

## 2023-09-19 ENCOUNTER — Other Ambulatory Visit: Payer: Self-pay | Admitting: Cardiovascular Disease

## 2023-09-22 DIAGNOSIS — Z85820 Personal history of malignant melanoma of skin: Secondary | ICD-10-CM | POA: Diagnosis not present

## 2023-09-22 DIAGNOSIS — Z85828 Personal history of other malignant neoplasm of skin: Secondary | ICD-10-CM | POA: Diagnosis not present

## 2023-09-22 DIAGNOSIS — S01111A Laceration without foreign body of right eyelid and periocular area, initial encounter: Secondary | ICD-10-CM | POA: Diagnosis not present

## 2023-09-22 DIAGNOSIS — D1801 Hemangioma of skin and subcutaneous tissue: Secondary | ICD-10-CM | POA: Diagnosis not present

## 2023-10-03 ENCOUNTER — Telehealth: Payer: Self-pay | Admitting: Cardiovascular Disease

## 2023-10-03 DIAGNOSIS — I2510 Atherosclerotic heart disease of native coronary artery without angina pectoris: Secondary | ICD-10-CM

## 2023-10-03 MED ORDER — NITROGLYCERIN 0.4 MG SL SUBL: 0.4000 mg | SUBLINGUAL_TABLET | SUBLINGUAL | 3 refills | Status: AC | PRN

## 2023-10-03 MED ORDER — ISOSORBIDE MONONITRATE ER 30 MG PO TB24: 15.0000 mg | ORAL_TABLET | Freq: Every day | ORAL | 3 refills | Status: AC

## 2023-10-03 NOTE — Progress Notes (Signed)
 Patient ID: [name] [name], male   DOB: Jun 13, 1932, 89 y.o.   MRN: [id number]     89 y.o. history of CAD. Stent to LAD in 2008.  ETT in 2013 abnormal with NSVT. Cath 05/01/12 30% ISR LaD and distal RCA 60-75% with FFR CT .92 Rx medically Myovue done 04/27/17 reviewed and normal with no ischemia study not gated due to PAC;s EF normal echo 06/25/15 55-60% LA only mildly dilated Had another myovue 04/22/20 which was normal with no ischemia EF estimtated 53%  Has Occular migraines Been going on for over 3-4 years  Seen by neurology And MRI/MRA no abnormality and EEG no epileptic foci  01/11/14  1-39% bilateral carotid disease   Has lower lumbar disc issue and bilateral hip pain Has had repeat injection by Dr Felipe Horton Also with right [facility] joint arthritis most recent injection 09/06/21 Has had Platelet Rich Plasma injections for his hips as well   03/2020 Went to [place] to visit his cousin Carmell Chiquito who is a famous Horticulturist, commercial in [place] has a hypochondriac for husband They moved onto [address] at [facility] last year  Daughter in [place] not working as PA but he likes her husband better Son works with him and got married for first time at age 87 to pharmacist at [facility] [facility] [employer]  Had a syncopal episode July  4th. 2022 Started with visual changes then BP low  80's with pre syncope  Felt weak for a couple hours. Had two drinks prior Not very hot out Occurred around 6 o'clock No  Chest pain, dyspnea Went to sleep and felt fine with no recurrence   Monitor: 12/29/20   showed no PAF but 8% burden atrial ectopy with self limited runs of SVT   He is building a [employer] on [address] Then will do one with a [employer] [address] on [address]   06/11/22:   had ? Angina. Tight pain in shoulders and back of neck with exertion that dissipates with rest Reproducible with similar degrees of stress No rest pain Also has had lower BP and we told him to hold his Zestril    Discussed prudence of heart cath given known distant history of  CAD/Stent and moderate residual Dx over 10 years ago  Cath done . 06/15/22 Dr Jacquelynn Matter patent stent in LAD 75% PLB stenosis with new stent placed EF was normal  Aggravated by red tape in starting [employer] and [employer]. Has a new place in [place]  03/08/23 having more body aches and chest pain Was in [place] walking his dog and had exertional shoulder and neck pain. Similar to pain before RCA stent He did not take nitroglycerin  for it. No rest pain We both felt repeat heart cath indicated  Cath done 03/11/23 Dr Arlester Ladd. Patient PLB/LAD stents Tightest lesion only 40% distal LAD  Called office 10/03/23 concern with chest pains Pain is a bit atypical being base of neck and across shoulders. Not always exertional He has a lot of building projects going on but stress level manageable. He was encouraged to get new nitroglycerin  and see if it helps Shared decision making favor exercise myovue to risk stratify up front given recent cath with patent stents 02/2023     ROS: Denies fever, malais, weight loss, blurry vision, decreased visual acuity, cough, sputum, SOB, hemoptysis, pleuritic pain, palpitaitons, heartburn, abdominal pain, melena, lower extremity edema, claudication, or rash.  All other systems reviewed and negative  General: BP (!) 122/50   Pulse Aaron Aas)  59   Ht 5\' 9"  (1.753 m)   Wt 144 lb (65.3 kg)   SpO2 97%   BMI 21.27 kg/m  Affect appropriate Healthy:  appears stated age HEENT: normal Neck supple with no adenopathy JVP normal no bruits no thyromegaly Lungs clear with no wheezing and good diaphragmatic motion Heart:  S1/S2 no murmur, no rub, gallop or click PMI normal Abdomen: benighn, BS positve, no tenderness, no AAA no bruit.  No HSM or HJR Distal pulses intact with no bruits No edema Neuro non-focal Skin warm and dry No muscular weakness     Current Outpatient Medications  Medication Sig Dispense Refill   ALPRAZolam  (XANAX ) 0.25 MG tablet TAKE 1 TABLET(0.25  MG) BY MOUTH TWICE DAILY AS NEEDED FOR ANXIETY 60 tablet 1   bisacodyl (DULCOLAX) 5 MG EC tablet Take 5-10 mg by mouth as needed for moderate constipation.     Cholecalciferol (VITAMIN D3) 50 MCG (2000 UT) capsule Take 2,000 Units by mouth daily.     clopidogrel  (PLAVIX ) 75 MG tablet Take 1 tablet (75 mg total) by mouth daily. 90 tablet 3   isosorbide  mononitrate (IMDUR ) 30 MG 24 hr tablet Take 0.5 tablets (15 mg total) by mouth daily. 45 tablet 3   lisinopril  (ZESTRIL ) 10 MG tablet TAKE 1 TABLET DAILY 90 tablet 1   metoprolol  succinate (TOPROL -XL) 50 MG 24 hr tablet TAKE 1 TABLET DAILY 90 tablet 3   nitroGLYCERIN  (NITROSTAT ) 0.4 MG SL tablet Place 1 tablet (0.4 mg total) under the tongue every 5 (five) minutes as needed for chest pain. 25 tablet 3   Polyethyl Glycol-Propyl Glycol (SYSTANE ULTRA) 0.4-0.3 % SOLN Place 1 drop into both eyes at bedtime.     psyllium (METAMUCIL) 58.6 % packet Take 1 packet by mouth daily.     rosuvastatin  (CRESTOR ) 40 MG tablet Take 1 tablet (40 mg total) by mouth daily. 90 tablet 3   tamsulosin  (FLOMAX ) 0.4 MG CAPS capsule Take 0.8 mg by mouth every morning.     zolpidem  (AMBIEN ) 10 MG tablet Take 1 tablet (10 mg total) by mouth at bedtime as needed for sleep. 90 tablet 0   No current facility-administered medications for this visit.    Allergies  Patient has no known allergies.  Electrocardiogram:  10/04/2023  SR rate 70 RBBB  no changes PACls   Assessment and Plan  CAD: LAD stent in 2008 06/15/22 new stent to PLB of RCA Continue DAT Cath 03/11/23 patent stents only distal LAD 40% Shoulder/neck pain. PRN nitroglycerin . Ex myovue to risk stratify. He understands to go to ER for any prolonged/severe pains   HLD : on generic crestor  now  Lab Results  Component Value Date   LDLCALC 51 07/02/2022    GERD:with constipation not helped with Miralax, ducolox or Linzess f/u Dr Delbra Feeler:  Chronic on ambien  f/u Dr Rochelle Chu   Visual Disturbance:  MRI/MRA  normal EEG normal has been seen by neurology ? Occular migraines   Ortho:  f/u Dr Felipe Horton for bilateral hip pain post injections MRI L3,4 disc bulge as well as right AC joint arthritis   Syncope:   Normal myovue 04/22/20 Labs ok Monitor with mostly atrial ectopy continue beta blocker BP lower now again ? Related to CAD Weight stable not postural D/c Zestril    EX Myovue SL Nitro   F/U  4-6 weeks    Janelle Mediate

## 2023-10-03 NOTE — Telephone Encounter (Signed)
 Called patient about his message. Patient stated his DBP has been low 118/50, chest pain with activity, SOB, lightheadedness, and fatigue. Patient's last cath was 02/2023. Most recent BP 119/62 and  HR 60's. Patient is really concerned. Encouraged patient to take nitroglycerin  or his Imdur  to see if that helps. Sent in refills to patient's pharmacy. Will see if patient can be added into Dr. Francie Irani clinic tomorrow.

## 2023-10-03 NOTE — Telephone Encounter (Signed)
  Pt c/o BP issue:  1. What are your last 5 BP readings?117/50; 118/56  2. Are you having any other symptoms (ex. Dizziness, headache, blurred vision, passed out)? Light headedness , chest pain, fatigue   3. What is your medication issue?    Patient is looking to get an appt asap. States that he is having issues with his bp, advise patient we dont have any availability. Patient is willing sitting in the waiting area to be seen.

## 2023-10-04 ENCOUNTER — Encounter: Payer: Self-pay | Admitting: Cardiovascular Disease

## 2023-10-04 ENCOUNTER — Ambulatory Visit: Attending: Cardiovascular Disease | Admitting: Cardiovascular Disease

## 2023-10-04 VITALS — BP 122/50 | HR 59 | Ht 69.0 in | Wt 144.0 lb

## 2023-10-04 DIAGNOSIS — I25118 Atherosclerotic heart disease of native coronary artery with other forms of angina pectoris: Secondary | ICD-10-CM | POA: Diagnosis not present

## 2023-10-04 DIAGNOSIS — I493 Ventricular premature depolarization: Secondary | ICD-10-CM | POA: Diagnosis not present

## 2023-10-04 DIAGNOSIS — R002 Palpitations: Secondary | ICD-10-CM | POA: Insufficient documentation

## 2023-10-04 DIAGNOSIS — R55 Syncope and collapse: Secondary | ICD-10-CM | POA: Diagnosis not present

## 2023-10-04 DIAGNOSIS — R072 Precordial pain: Secondary | ICD-10-CM | POA: Insufficient documentation

## 2023-10-04 NOTE — Patient Instructions (Addendum)
 Medication Instructions:  Your physician recommends that you continue on your current medications as directed. Please refer to the Current Medication list given to you today.  *If you need a refill on your cardiac medications before your next appointment, please call your pharmacy*  Lab Work: If you have labs (blood work) drawn today and your tests are completely normal, you will receive your results only by: MyChart Message (if you have MyChart) OR A paper copy in the mail If you have any lab test that is abnormal or we need to change your treatment, we will call you to review the results.  Testing/Procedures: Your physician has requested that you have en exercise stress myoview . For further information please visit [email or web address] Please follow instruction sheet, as given.  Follow-Up: At [facility], you and your health needs are our priority.  As part of our continuing mission to provide you with exceptional heart care, our providers are all part of one team.  This team includes your primary Cardiologist (physician) and Advanced Practice Providers or APPs (Physician Assistants and Nurse Practitioners) who all work together to provide you with the care you need, when you need it.  Your next appointment:   4 to 6 weeks  Provider:   Janelle Mediate, MD    We recommend signing up for the patient portal called "MyChart".  Sign up information is provided on this After Visit Summary.  MyChart is used to connect with patients for Virtual Visits (Telemedicine).  Patients are able to view lab/test results, encounter notes, upcoming appointments, etc.  Non-urgent messages can be sent to your provider as well.   To learn more about what you can do with MyChart, go to [email or web address].

## 2023-10-05 ENCOUNTER — Other Ambulatory Visit: Payer: Self-pay | Admitting: Cardiovascular Disease

## 2023-10-05 DIAGNOSIS — R079 Chest pain, unspecified: Secondary | ICD-10-CM

## 2023-10-05 DIAGNOSIS — R072 Precordial pain: Secondary | ICD-10-CM

## 2023-10-07 ENCOUNTER — Telehealth (HOSPITAL_COMMUNITY): Payer: Self-pay | Admitting: *Deleted

## 2023-10-07 NOTE — Telephone Encounter (Signed)
 Patient given detailed instructions per Myocardial Perfusion Study Information Sheet for the test on 10/11/2023 at 10:30. Patient notified to arrive 15 minutes early and that it is imperative to arrive on time for appointment to keep from having the test rescheduled.  If you need to cancel or reschedule your appointment, please call the office within 24 hours of your appointment. . Patient verbalized understanding.[name] [name]

## 2023-10-11 ENCOUNTER — Ambulatory Visit: Payer: Self-pay | Admitting: Cardiovascular Disease

## 2023-10-11 ENCOUNTER — Ambulatory Visit (HOSPITAL_COMMUNITY)
Admission: RE | Admit: 2023-10-11 | Discharge: 2023-10-11 | Disposition: A | Discharge status: Home / Self Care | Source: Ambulatory Visit | Attending: Cardiovascular Disease | Admitting: Cardiovascular Disease

## 2023-10-11 DIAGNOSIS — I2510 Atherosclerotic heart disease of native coronary artery without angina pectoris: Secondary | ICD-10-CM | POA: Diagnosis not present

## 2023-10-11 DIAGNOSIS — R072 Precordial pain: Secondary | ICD-10-CM | POA: Insufficient documentation

## 2023-10-11 DIAGNOSIS — R079 Chest pain, unspecified: Secondary | ICD-10-CM | POA: Diagnosis not present

## 2023-10-11 LAB — MYOCARDIAL PERFUSION IMAGING
Angina Index: 0
Duke Treadmill Score: 5
Estimated workload: 4.6
Exercise duration (min): 4 min
Exercise duration (sec): 36 s
LV dias vol: 82 mL (ref 62–150)
LV sys vol: 25 mL
MPHR: 132 {beats}/min
Nuc Stress EF: 70 %
Peak HR: 127 {beats}/min
Percent HR: 96 %
Rest HR: 68 {beats}/min
Rest Nuclear Isotope Dose: 9.9 mCi
SDS: 0
SRS: 5
SSS: 1
ST Depression (mm): 0 mm
Stress Nuclear Isotope Dose: 32.2 mCi
TID: 0.95

## 2023-10-11 MED ORDER — TECHNETIUM TC 99M TETROFOSMIN IV KIT
9.9000 | PACK | Freq: Once | INTRAVENOUS | Status: AC | PRN
  Administered 2023-10-11: 9.9 via INTRAVENOUS

## 2023-10-11 MED ORDER — TECHNETIUM TC 99M TETROFOSMIN IV KIT
32.2000 | PACK | Freq: Once | INTRAVENOUS | Status: AC | PRN
Start: 2023-10-11 — End: 2023-10-11
  Administered 2023-10-11: 32.2 via INTRAVENOUS

## 2023-10-13 DIAGNOSIS — H532 Diplopia: Secondary | ICD-10-CM | POA: Diagnosis not present

## 2023-10-13 DIAGNOSIS — H01001 Unspecified blepharitis right upper eyelid: Secondary | ICD-10-CM | POA: Diagnosis not present

## 2023-10-17 ENCOUNTER — Ambulatory Visit: Payer: Self-pay | Admitting: Cardiovascular Disease

## 2023-10-17 ENCOUNTER — Encounter: Payer: Self-pay | Admitting: Family Medicine

## 2023-10-17 ENCOUNTER — Ambulatory Visit (INDEPENDENT_AMBULATORY_CARE_PROVIDER_SITE_OTHER): Admitting: Family Medicine

## 2023-10-17 ENCOUNTER — Other Ambulatory Visit: Payer: Self-pay

## 2023-10-17 VITALS — BP 100/52 | HR 79 | Temp 98.6°F | Resp 18 | Ht 69.0 in | Wt 143.0 lb

## 2023-10-17 DIAGNOSIS — N289 Disorder of kidney and ureter, unspecified: Secondary | ICD-10-CM

## 2023-10-17 DIAGNOSIS — M791 Myalgia, unspecified site: Secondary | ICD-10-CM

## 2023-10-17 DIAGNOSIS — I493 Ventricular premature depolarization: Secondary | ICD-10-CM

## 2023-10-17 DIAGNOSIS — R935 Abnormal findings on diagnostic imaging of other abdominal regions, including retroperitoneum: Secondary | ICD-10-CM

## 2023-10-17 DIAGNOSIS — F411 Generalized anxiety disorder: Secondary | ICD-10-CM

## 2023-10-17 DIAGNOSIS — I25118 Atherosclerotic heart disease of native coronary artery with other forms of angina pectoris: Secondary | ICD-10-CM

## 2023-10-17 DIAGNOSIS — H9192 Unspecified hearing loss, left ear: Secondary | ICD-10-CM | POA: Diagnosis not present

## 2023-10-17 NOTE — Progress Notes (Signed)
 Assessment & Plan:  1. Muscle pain (Primary) Neck exercises provided for patient to complete at home. Recommended heating pad, muscle rub, muscle patches and a massage. Patient declined a muscle relaxer.  2. Renal lesion Reassurance provided that the lesion was seen 7 years ago and has only grown 2 mm. Encouraged to keep appointment for ultrasound and nephrologist that cardiology ordered this morning.  3. Hearing loss of left ear, unspecified hearing loss type - Ambulatory referral to Audiology   Follow up plan: Return if symptoms worsen or fail to improve.  [name] Los, MSN, APRN, FNP-C  Subjective:  HPI: [name] [name] is a 89 y.o. male presenting on 10/17/2023 for Neck Pain (Pain in the center of his neck that radiates to left and right shoulders x 3 weeks)  Patient reports neck pain in the center of his neck that radiates a few inches on both sides towards his shoulders. Pain started three weeks ago. It occurs when he goes walking. It gets worse for the first 10 minutes of walking and by the time he has finished walking 30 minutes, it has resolved. Previously when he experienced neck pain he needed a heart stent, so he initial reached out to his cardiologist who ordered a cardiac CT. This resulted on Saturday and was normal.   The cardiac CT additionally identified a 2.8 cm renal lesion and recommended further evaluation with an ultrasound which was order this morning by his cardiologist as well as a referral to nephrology. [name] [name] did have a CT completed on 12/30/2016 at which time the renal lesion was present and measured 2.6 cm and was consistent with a renal cyst.   Additionally, patient is requesting a referral to an audiologist for left sided hearing loss. He states he saw someone 15 years ago and never did anything about it. He does not remember who he saw.     ROS: Negative unless specifically indicated above in HPI.   Relevant past medical history reviewed and updated as  indicated.   Allergies and medications reviewed and updated.   Current Outpatient Medications:    ALPRAZolam  (XANAX ) 0.25 MG tablet, TAKE 1 TABLET(0.25 MG) BY MOUTH TWICE DAILY AS NEEDED FOR ANXIETY, Disp: 60 tablet, Rfl: 1   Cholecalciferol (VITAMIN D3) 50 MCG (2000 UT) capsule, Take 2,000 Units by mouth daily., Disp: , Rfl:    clopidogrel  (PLAVIX ) 75 MG tablet, Take 1 tablet (75 mg total) by mouth daily., Disp: 90 tablet, Rfl: 3   lisinopril  (ZESTRIL ) 10 MG tablet, TAKE 1 TABLET DAILY, Disp: 90 tablet, Rfl: 1   metoprolol  succinate (TOPROL -XL) 50 MG 24 hr tablet, TAKE 1 TABLET DAILY, Disp: 90 tablet, Rfl: 3   Polyethyl Glycol-Propyl Glycol (SYSTANE ULTRA) 0.4-0.3 % SOLN, Place 1 drop into both eyes at bedtime., Disp: , Rfl:    psyllium (METAMUCIL) 58.6 % packet, Take 1 packet by mouth daily., Disp: , Rfl:    rosuvastatin  (CRESTOR ) 40 MG tablet, Take 1 tablet (40 mg total) by mouth daily., Disp: 90 tablet, Rfl: 3   tamsulosin  (FLOMAX ) 0.4 MG CAPS capsule, Take 0.8 mg by mouth every morning., Disp: , Rfl:    zolpidem  (AMBIEN ) 10 MG tablet, Take 1 tablet (10 mg total) by mouth at bedtime as needed for sleep., Disp: 90 tablet, Rfl: 0   isosorbide  mononitrate (IMDUR ) 30 MG 24 hr tablet, Take 0.5 tablets (15 mg total) by mouth daily. (Patient not taking: Reported on 10/17/2023), Disp: 45 tablet, Rfl: 3   nitroGLYCERIN  (NITROSTAT ) 0.4 MG  SL tablet, Place 1 tablet (0.4 mg total) under the tongue every 5 (five) minutes as needed for chest pain. (Patient not taking: Reported on 10/17/2023), Disp: 25 tablet, Rfl: 3  No Known Allergies  Objective:   BP (!) 100/52   Pulse 79   Temp 98.6 F (37 C)   Resp 18   Ht 5\' 9"  (1.753 m)   Wt 143 lb (64.9 kg)   SpO2 98%   BMI 21.12 kg/m    Physical Exam Vitals reviewed.  Constitutional:      General: He is not in acute distress.    Appearance: Normal appearance. He is not ill-appearing, toxic-appearing or diaphoretic.  HENT:     Head: Normocephalic and  atraumatic.  Eyes:     General: No scleral icterus.       Right eye: No discharge.        Left eye: No discharge.     Conjunctiva/sclera: Conjunctivae normal.  Cardiovascular:     Rate and Rhythm: Normal rate.  Pulmonary:     Effort: Pulmonary effort is normal. No respiratory distress.  Musculoskeletal:        General: Normal range of motion.     Cervical back: Normal range of motion. Tenderness (muscular in trapezius muscles) present. No bony tenderness.  Skin:    General: Skin is warm and dry.  Neurological:     Mental Status: He is alert and oriented to person, place, and time. Mental status is at baseline.  Psychiatric:        Mood and Affect: Mood normal.        Behavior: Behavior normal.        Thought Content: Thought content normal.        Judgment: Judgment normal.

## 2023-10-17 NOTE — Patient Instructions (Addendum)
 Heating pad Muscle rub - Biofreeze Lidoderm  patches Massage gun

## 2023-10-17 NOTE — Telephone Encounter (Signed)
-----   Message from Janelle Mediate sent at 10/17/2023  8:25 AM EDT ----- Order renal US  and refer to renal ASAP may need MRI of kidney.

## 2023-10-17 NOTE — Telephone Encounter (Signed)
 Left message for patient to call back

## 2023-10-18 NOTE — Progress Notes (Unsigned)
 [facility] [address] [place] [address] Phone: [phone] Subjective:   [name] [name], am serving as a scribe for Dr. Ronnell Coins.  I'm seeing this patient by the request  of:  Arcadio Knuckles, MD  CC: Right knee pain  QIH:[id number]  03/15/2023 Bilateral trigger points given today.  Tolerated the procedure well.  Think this is better than giving any medications with patient recently having the difficulty with his chest discomfort.  Workup had been benign no.  If continuing to have difficulty we do need to consider the possibility of advanced imaging of the cervical spine and may be even a CT of the chest.  Continue to be active.  Follow-up again in 6 to 8 weeks     Has had history of cervical radiculitis.  Could be potentially contributing.  Repeat x-rays noted today.  We will also did some trigger point injections.  Hopeful that this will make some improvement.  Patient feels and continuing to have some discomfort.  Recently checked up with a cardiac catheterization of the heart not showing any coronary artery disease.  Follow-up with me again in 6 to 8 weeks otherwise.      Update 10/19/2023 [name] [name] is a 89 y.o. male coming in with complaint of R shoulder and neck pain. Patient states that the shoulder is much better.   R knee pain is worsening. Being seated or lying down increases his pain. Pain throughout entire joint.       Past Medical History:  Diagnosis Date   Anxiety    CAD (coronary artery disease)    a. s/p stent to LAD 2008;  b. abnormal ETT 11/13 with VTach => LHC pLAD 30% ISR, mCFX 30%, dRCA 60-75% which had progressed from previous study in 2010 => FFR of RCA 0.92 => med Rx    Carotid stenosis    dopplers 1/14:  0-39% bilateral ICA stenosis   Colon polyps    Colon polyps    adenomatous   Diverticular disease of colon    Hemorrhoids    external and internal   History of BPH    HTN (hypertension)    Hx of  echocardiogram    a. Echo 9/11:  EF 55-60%, normal motion, grade 2 diastolic dysfunction, mild MR, mild LAE, mild RAE, PASP 33.   Hyperlipidemia    IBS (irritable bowel syndrome)    Rhinitis    Stroke, lacunar (HCC) 08/24/2012   Old, 2011 head mri   Past Surgical History:  Procedure Laterality Date   APPENDECTOMY  05/17/1954   CORONARY STENT INTERVENTION N/A 06/15/2022   Procedure: CORONARY STENT INTERVENTION;  Surgeon: Lucendia Rusk, MD;  Location: [facility] INVASIVE CV LAB;  Service: Cardiovascular;  Laterality: N/A;   CORONARY STENT PLACEMENT  05/17/2006    Successful PCI of the lesion in the proximal LAD using a Promus    HERNIA REPAIR Right 05/18/2003   LEFT HEART CATH AND CORONARY ANGIOGRAPHY N/A 06/15/2022   Procedure: LEFT HEART CATH AND CORONARY ANGIOGRAPHY;  Surgeon: Lucendia Rusk, MD;  Location: [facility] INVASIVE CV LAB;  Service: Cardiovascular;  Laterality: N/A;   LEFT HEART CATH AND CORONARY ANGIOGRAPHY N/A 03/10/2023   Procedure: LEFT HEART CATH AND CORONARY ANGIOGRAPHY;  Surgeon: Arnoldo Lapping, MD;  Location: [facility] INVASIVE CV LAB;  Service: Cardiovascular;  Laterality: N/A;   NASAL SINUS SURGERY  05/17/1973   PERCUTANEOUS CORONARY STENT INTERVENTION (PCI-S) N/A 05/03/2012   Procedure: PERCUTANEOUS CORONARY STENT  INTERVENTION (PCI-S);  Surgeon: Arnoldo Lapping, MD;  Location: [facility] CATH LAB;  Service: Cardiovascular;  Laterality: N/A;   Promus DES ok for 3T MRI  05/17/2006   Social History   Socioeconomic History   Marital status: Married    Spouse name: Not on file   Number of children: 3   Years of education: 16   Highest education level: Master's degree (e.g., MA, MS, MEng, MEd, MSW, MBA)  Occupational History   Occupation: businessman, Financial planner: RETIRED  Tobacco Use   Smoking status: Never    Passive exposure: Never   Smokeless tobacco: Never  Vaping Use   Vaping status: Never Used  Substance and Sexual Activity   Alcohol use: Yes     Comment: 3 per week   Drug use: No   Sexual activity: Yes    Partners: Female  Other Topics Concern   Not on file  Social History Narrative   HSG, Engineer, 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 He has two daughters, 1 in [place] 1 in [place] (Dec '12) and a son who works with him. Several grandchildren.Reports that he spends a good deal of time at his home in [place] which he greatly enjoys and his home in the [place]. Enjoys driving [place] BMW [place] when [place].      ACP - Yes CPR, yes for short-term mechanical ventilation for reversible disease. Recommended [email or web address] for consideration.   Social Drivers of Corporate investment banker Strain: Low Risk  (05/08/2023)   Overall Financial Resource Strain (CARDIA)    Difficulty of Paying Living Expenses: Not hard at all  Food Insecurity: No Food Insecurity (05/08/2023)   Hunger Vital Sign    Worried About Running Out of Food in the Last Year: Never true    Ran Out of Food in the Last Year: Never true  Transportation Needs: No Transportation Needs (05/08/2023)   PRAPARE - Administrator, Civil Service (Medical): No    Lack of Transportation (Non-Medical): No  Physical Activity: Insufficiently Active (05/08/2023)   Exercise Vital Sign    Days of Exercise per Week: 4 days    Minutes of Exercise per Session: 30 min  Stress: No Stress Concern Present (05/08/2023)   [name] of Occupational Health - Occupational Stress Questionnaire    Feeling of Stress : Not at all  Social Connections: Socially Integrated (05/08/2023)   Social Connection and Isolation Panel [NHANES]    Frequency of Communication with Friends and Family: More than three times a week    Frequency of Social Gatherings with Friends and Family: More than three times a week    Attends Religious Services: More than 4 times per year     Active Member of [employer] or Organizations: Yes    Attends Banker Meetings: 1 to 4 times per year    Marital Status: Married   No Known Allergies Family History  Problem Relation Age of Onset   Rheum arthritis Mother    Coronary artery disease Father    Heart attack Father    Cancer Brother        ? TYPE   Colon cancer Neg Hx    Stomach cancer Neg Hx    Pancreatic cancer Neg Hx      Current Outpatient Medications (Cardiovascular):    isosorbide  mononitrate (IMDUR ) 30 MG 24 hr tablet, Take 0.5  tablets (15 mg total) by mouth daily.   lisinopril  (ZESTRIL ) 10 MG tablet, TAKE 1 TABLET DAILY   metoprolol  succinate (TOPROL -XL) 50 MG 24 hr tablet, TAKE 1 TABLET DAILY   nitroGLYCERIN  (NITROSTAT ) 0.4 MG SL tablet, Place 1 tablet (0.4 mg total) under the tongue every 5 (five) minutes as needed for chest pain.   rosuvastatin  (CRESTOR ) 40 MG tablet, Take 1 tablet (40 mg total) by mouth daily.    Current Outpatient Medications (Hematological):    clopidogrel  (PLAVIX ) 75 MG tablet, Take 1 tablet (75 mg total) by mouth daily.  Current Outpatient Medications (Other):    ALPRAZolam  (XANAX ) 0.25 MG tablet, TAKE 1 TABLET(0.25 MG) BY MOUTH TWICE DAILY AS NEEDED FOR ANXIETY   Cholecalciferol (VITAMIN D3) 50 MCG (2000 UT) capsule, Take 2,000 Units by mouth daily.   Polyethyl Glycol-Propyl Glycol (SYSTANE ULTRA) 0.4-0.3 % SOLN, Place 1 drop into both eyes at bedtime.   psyllium (METAMUCIL) 58.6 % packet, Take 1 packet by mouth daily.   tamsulosin  (FLOMAX ) 0.4 MG CAPS capsule, Take 0.8 mg by mouth every morning.   zolpidem  (AMBIEN ) 10 MG tablet, Take 1 tablet (10 mg total) by mouth at bedtime as needed for sleep.   Reviewed prior external information including notes and imaging from  primary care provider As well as notes that were available from care everywhere and other healthcare systems.  Past medical history, social, surgical and family history all reviewed in electronic medical  record.  No pertanent information unless stated regarding to the chief complaint.   Review of Systems:  No headache, visual changes, nausea, vomiting, diarrhea, constipation, dizziness, abdominal pain, skin rash, fevers, chills, night sweats, weight loss, swollen lymph nodes, body aches, joint swelling, chest pain, shortness of breath, mood changes. POSITIVE muscle aches  Objective  Blood pressure 120/82, pulse 67, height 5\' 9"  (1.753 m), weight 143 lb (64.9 kg), SpO2 96%.   General: No apparent distress alert and oriented x3 mood and affect normal, dressed appropriately.  HEENT: Pupils equal, extraocular movements intact  Respiratory: Patient's speak in full sentences and does not appear short of breath  Cardiovascular: No lower extremity edema, non tender, no erythema  Right knee exam shows the patient does have some mild crepitus noted.  Some tenderness to palpation more over the medial joint line.  Positive McMurray's noted today.  Limited muscular skeletal ultrasound was performed and interpreted by Ronnell Coins, M  Limited ReSound shows what appears to be an acute displaced by approximately 25% medial meniscal tear.  Trace hypoechoic changes of the patellofemoral joint also noted.   After informed written and verbal consent, patient was seated on exam table. Right knee was prepped with alcohol swab and utilizing anterolateral approach, patient's right knee space was injected with 4:1  marcaine 0.5%: Kenalog  40mg /dL. Patient tolerated the procedure well without immediate complications.   Impression and Recommendations:    The above documentation has been reviewed and is accurate and complete Idania Desouza M Dayan Kreis, DO

## 2023-10-19 ENCOUNTER — Ambulatory Visit: Payer: Self-pay

## 2023-10-19 ENCOUNTER — Ambulatory Visit (INDEPENDENT_AMBULATORY_CARE_PROVIDER_SITE_OTHER): Admitting: Family Medicine

## 2023-10-19 ENCOUNTER — Encounter: Payer: Self-pay | Admitting: Family Medicine

## 2023-10-19 ENCOUNTER — Ambulatory Visit: Payer: Self-pay | Admitting: Cardiovascular Disease

## 2023-10-19 ENCOUNTER — Ambulatory Visit (HOSPITAL_COMMUNITY)
Admission: RE | Admit: 2023-10-19 | Discharge: 2023-10-19 | Disposition: A | Discharge status: Home / Self Care | Source: Ambulatory Visit | Attending: Cardiovascular Disease | Admitting: Cardiovascular Disease

## 2023-10-19 VITALS — BP 120/82 | HR 67 | Ht 69.0 in | Wt 143.0 lb

## 2023-10-19 DIAGNOSIS — M25511 Pain in right shoulder: Secondary | ICD-10-CM

## 2023-10-19 DIAGNOSIS — N289 Disorder of kidney and ureter, unspecified: Secondary | ICD-10-CM | POA: Insufficient documentation

## 2023-10-19 DIAGNOSIS — I25118 Atherosclerotic heart disease of native coronary artery with other forms of angina pectoris: Secondary | ICD-10-CM | POA: Insufficient documentation

## 2023-10-19 DIAGNOSIS — I493 Ventricular premature depolarization: Secondary | ICD-10-CM | POA: Insufficient documentation

## 2023-10-19 DIAGNOSIS — S83241A Other tear of medial meniscus, current injury, right knee, initial encounter: Secondary | ICD-10-CM | POA: Insufficient documentation

## 2023-10-19 DIAGNOSIS — N281 Cyst of kidney, acquired: Secondary | ICD-10-CM | POA: Diagnosis not present

## 2023-10-19 DIAGNOSIS — R935 Abnormal findings on diagnostic imaging of other abdominal regions, including retroperitoneum: Secondary | ICD-10-CM | POA: Insufficient documentation

## 2023-10-19 NOTE — Assessment & Plan Note (Signed)
 Acute problem with worsening discomfort and pain.  Discussed icing regimen and home exercises, discussed which activities to do and which ones to avoid.  We discussed avoiding twisting motions.  Given injection today which tolerated the procedure well.  No underlying arthritis that we will need to monitor as well.  Follow-up again in 6 to 8 weeks otherwise.

## 2023-10-19 NOTE — Patient Instructions (Signed)
Injected knee today  See me again in 6 weeks 

## 2023-11-02 ENCOUNTER — Ambulatory Visit: Attending: Family Medicine | Admitting: Audiologist

## 2023-11-02 DIAGNOSIS — H903 Sensorineural hearing loss, bilateral: Secondary | ICD-10-CM | POA: Insufficient documentation

## 2023-11-02 NOTE — Procedures (Signed)
  [facility] [address] [place], [place] [address] [phone]  AUDIOLOGICAL EVALUATION  Name: [name] [name]    Status: Outpatient DOB: Jul 12, 1932    Referent: Arcadio Knuckles, MD MRN: [id number] Date: 11/02/2023     Diagnosis: Sensorineural hearing loss, bilateral   HISTORY: [name] [name], 89 y.o., was seen for an audiological evaluation.   [name] [name] notices increased difficulty hearing. He feels that he depends on visual cues to understand those around him. He states that 15 or more years ago he had a sudden loss of hearing in the left ear. He remembers that his MRI was negative and he was fit with a hearing aid for the left ear, but did not find it helpful.  [name] [name] notes no ear pain, pressure, fullness, tinnitus, or dizziness.  [name] [name] reports significant history of noise exposure in the [employer]. There is no family history of hearing loss or history of ear surgery or ear infections.          EVALUATION: Otoscopic inspection reveals clear ear canals with visible tympanic membranes.   Tympanometry was completed to assess middle ear status. Normal, Type A tympanograms were obtained bilaterally.  Standard audiometric techniques were used to obtain thresholds under high frequency headphones and insert earphones. Speech reception thresholds are 20 dBHL on the right using recorded spondee word lists. Word recognition was 100% at 60 dBHL on the right using recorded NU-6 word lists, in quiet. Speech testing was not attempted in the left ear.  A mild sloping to severe predominantly sensorineural hearing loss is present in the right ear and a profound hearing loss in the left ear. A vibrotactile response was obtained at 500Hz  for bone conduction.    CONCLUSION:  [name] [name] has a mild sloping to severe hearing loss in the right ear and a profound hearing loss in the left ear.  Word recognition for the right ear is good in quiet at conversational speech levels. Findings were  reviewed with the patient. [facility] with [facility] and the [facility] are [facility]. Various options for single sided deafness were discussed, including CROS and cochlear implant.  RECOMMENDATIONS: Medical evaluation by an Ear, Nose and Throat physician due to asymmetric hearing loss. [facility] is recommended as they are a local cochlear implant center.  Pending medical clearance, amplification is recommended to assist in daily listening situations. A list of area hearing aid providers was given to the patient, however he should investigate the availability of [facility] benefits. Use of communication strategies to improve communication in difficult listening situations.  Audiogram scanned in under media tab.  Burgess Caroline, Au.D., CCC-A Audiologist 11/02/2023  cc: Arcadio Knuckles, MD

## 2023-11-04 ENCOUNTER — Encounter: Payer: Self-pay | Admitting: Cardiovascular Disease

## 2023-11-16 ENCOUNTER — Other Ambulatory Visit: Payer: Self-pay | Admitting: Medical Genetics

## 2023-11-16 ENCOUNTER — Other Ambulatory Visit: Payer: Self-pay | Admitting: Internal Medicine

## 2023-11-16 DIAGNOSIS — F5104 Psychophysiologic insomnia: Secondary | ICD-10-CM

## 2023-11-18 ENCOUNTER — Ambulatory Visit
Admission: EM | Admit: 2023-11-18 | Discharge: 2023-11-18 | Disposition: A | Discharge status: Home / Self Care | Attending: Family Medicine | Admitting: Family Medicine

## 2023-11-18 DIAGNOSIS — U071 COVID-19: Secondary | ICD-10-CM | POA: Diagnosis not present

## 2023-11-18 MED ORDER — NIRMATRELVIR&RITONAVIR 150/100 10 X 150 MG & 10 X 100MG PO TBPK: ORAL_TABLET | ORAL | 0 refills | Status: DC

## 2023-11-18 NOTE — ED Triage Notes (Signed)
 Pt reports cough, fatigue x 2 days. Testes  COVID positive today. Delsym gives no relief.

## 2023-11-18 NOTE — ED Provider Notes (Signed)
 [facility]  Note:  This document was prepared using Conservation officer, historic buildings and may include unintentional dictation errors.  MRN: [id number] DOB: January 30, 1933  Subjective:   [name] [name] is a 89 y.o. male presenting for 2 day history of malaise and fatigue. Tested positive for COVID 19. Would like Paxlovid . Has previously taken this without any issues. No fever, chest pain, shob, weakness, confusion, numbness or tingling, n/v, abdominal pain.  Regarding his blood pressure, generally runs low. Denies symptoms as above.   No current facility-administered medications for this encounter.  Current Outpatient Medications:    ALPRAZolam  (XANAX ) 0.25 MG tablet, TAKE 1 TABLET(0.25 MG) BY MOUTH TWICE DAILY AS NEEDED FOR ANXIETY, Disp: 60 tablet, Rfl: 1   Cholecalciferol (VITAMIN D3) 50 MCG (2000 UT) capsule, Take 2,000 Units by mouth daily., Disp: , Rfl:    clopidogrel  (PLAVIX ) 75 MG tablet, Take 1 tablet (75 mg total) by mouth daily., Disp: 90 tablet, Rfl: 3   isosorbide  mononitrate (IMDUR ) 30 MG 24 hr tablet, Take 0.5 tablets (15 mg total) by mouth daily., Disp: 45 tablet, Rfl: 3   lisinopril  (ZESTRIL ) 10 MG tablet, TAKE 1 TABLET DAILY, Disp: 90 tablet, Rfl: 1   metoprolol  succinate (TOPROL -XL) 50 MG 24 hr tablet, TAKE 1 TABLET DAILY, Disp: 90 tablet, Rfl: 3   nitroGLYCERIN  (NITROSTAT ) 0.4 MG SL tablet, Place 1 tablet (0.4 mg total) under the tongue every 5 (five) minutes as needed for chest pain., Disp: 25 tablet, Rfl: 3   Polyethyl Glycol-Propyl Glycol (SYSTANE ULTRA) 0.4-0.3 % SOLN, Place 1 drop into both eyes at bedtime., Disp: , Rfl:    psyllium (METAMUCIL) 58.6 % packet, Take 1 packet by mouth daily., Disp: , Rfl:    rosuvastatin  (CRESTOR ) 40 MG tablet, Take 1 tablet (40 mg total) by mouth daily., Disp: 90 tablet, Rfl: 3   tamsulosin  (FLOMAX ) 0.4 MG CAPS capsule, Take 0.8 mg by mouth every morning., Disp: , Rfl:    zolpidem  (AMBIEN ) 10 MG tablet, Take 1  tablet (10 mg total) by mouth at bedtime as needed for sleep., Disp: 90 tablet, Rfl: 0   No Known Allergies  Past Medical History:  Diagnosis Date   Anxiety    CAD (coronary artery disease)    a. s/p stent to LAD 2008;  b. abnormal ETT 11/13 with VTach => LHC pLAD 30% ISR, mCFX 30%, dRCA 60-75% which had progressed from previous study in 2010 => FFR of RCA 0.92 => med Rx    Carotid stenosis    dopplers 1/14:  0-39% bilateral ICA stenosis   Colon polyps    Colon polyps    adenomatous   Diverticular disease of colon    Hemorrhoids    external and internal   History of BPH    HTN (hypertension)    Hx of echocardiogram    a. Echo 9/11:  EF 55-60%, normal motion, grade 2 diastolic dysfunction, mild MR, mild LAE, mild RAE, PASP 33.   Hyperlipidemia    IBS (irritable bowel syndrome)    Rhinitis    Stroke, lacunar (HCC) 08/24/2012   Old, 2011 head mri     Past Surgical History:  Procedure Laterality Date   APPENDECTOMY  05/17/1954   CORONARY STENT INTERVENTION N/A 06/15/2022   Procedure: CORONARY STENT INTERVENTION;  Surgeon: Dann Candyce RAMAN, MD;  Location: [facility] INVASIVE CV LAB;  Service: Cardiovascular;  Laterality: N/A;   CORONARY STENT PLACEMENT  05/17/2006    Successful PCI of the lesion  in the proximal LAD using a Promus    HERNIA REPAIR Right 05/18/2003   LEFT HEART CATH AND CORONARY ANGIOGRAPHY N/A 06/15/2022   Procedure: LEFT HEART CATH AND CORONARY ANGIOGRAPHY;  Surgeon: Dann Candyce RAMAN, MD;  Location: [facility] INVASIVE CV LAB;  Service: Cardiovascular;  Laterality: N/A;   LEFT HEART CATH AND CORONARY ANGIOGRAPHY N/A 03/10/2023   Procedure: LEFT HEART CATH AND CORONARY ANGIOGRAPHY;  Surgeon: Wonda Sharper, MD;  Location: [facility] INVASIVE CV LAB;  Service: Cardiovascular;  Laterality: N/A;   NASAL SINUS SURGERY  05/17/1973   PERCUTANEOUS CORONARY STENT INTERVENTION (PCI-S) N/A 05/03/2012   Procedure: PERCUTANEOUS CORONARY STENT INTERVENTION (PCI-S);  Surgeon: Sharper Wonda,  MD;  Location: [facility] CATH LAB;  Service: Cardiovascular;  Laterality: N/A;   Promus DES ok for 3T MRI  05/17/2006    Family History  Problem Relation Age of Onset   Rheum arthritis Mother    Coronary artery disease Father    Heart attack Father    Cancer Brother        ? TYPE   Colon cancer Neg Hx    Stomach cancer Neg Hx    Pancreatic cancer Neg Hx     Social History   Tobacco Use   Smoking status: Never    Passive exposure: Never   Smokeless tobacco: Never  Vaping Use   Vaping status: Never Used  Substance Use Topics   Alcohol use: Yes    Comment: 3 per week   Drug use: No    ROS   Objective:   Vitals: BP (!) 87/52 (BP Location: Left Arm)   Pulse 64   Temp 98.2 F (36.8 C) (Oral)   Resp 18   SpO2 94%   BP Readings from Last 3 Encounters:  11/18/23 (!) 87/52  10/19/23 120/82  10/17/23 (!) 100/52   Physical Exam Constitutional:      General: He is not in acute distress.    Appearance: Normal appearance. He is well-developed. He is not ill-appearing, toxic-appearing or diaphoretic.  HENT:     Head: Normocephalic and atraumatic.     Right Ear: External ear normal.     Left Ear: External ear normal.     Nose: Nose normal.     Mouth/Throat:     Mouth: Mucous membranes are moist.  Eyes:     General: No scleral icterus.       Right eye: No discharge.        Left eye: No discharge.     Extraocular Movements: Extraocular movements intact.  Cardiovascular:     Rate and Rhythm: Normal rate and regular rhythm.     Heart sounds: Normal heart sounds. No murmur heard.    No friction rub. No gallop.  Pulmonary:     Effort: Pulmonary effort is normal. No respiratory distress.     Breath sounds: Normal breath sounds. No stridor. No wheezing, rhonchi or rales.  Neurological:     Mental Status: He is alert and oriented to person, place, and time.  Psychiatric:        Mood and Affect: Mood normal.        Behavior: Behavior normal.        Thought Content: Thought  content normal.     Assessment and Plan :   PDMP not reviewed this encounter.  1. COVID-19 virus infection    Will use renal dosing given most recent GFR from October was 63mL/min. Recommend supportive care otherwise. Will defer chest x-ray given clear pulmonary  exam. Counseled patient on potential for adverse effects with medications prescribed/recommended today, ER and return-to-clinic precautions discussed, patient verbalized understanding.    [name] Savannah, [place] 11/18/23 1737

## 2023-11-18 NOTE — Discharge Instructions (Addendum)
 Hold you cholesterol and Ambien  medication to prevent medication interactions with Paxlovid .

## 2023-11-21 ENCOUNTER — Ambulatory Visit (INDEPENDENT_AMBULATORY_CARE_PROVIDER_SITE_OTHER): Payer: Medicare Other

## 2023-11-21 VITALS — Ht 69.0 in | Wt 140.0 lb

## 2023-11-21 DIAGNOSIS — Z0000 Encounter for general adult medical examination without abnormal findings: Secondary | ICD-10-CM

## 2023-11-21 NOTE — Progress Notes (Signed)
 Subjective:   [name] [name] is a 89 y.o. who presents for a Medicare Wellness preventive visit.  As a reminder, Annual Wellness Visits don't include a physical exam, and some assessments may be limited, especially if this visit is performed virtually. We may recommend an in-person follow-up visit with your provider if needed.  Visit Complete: Virtual I connected with  [name] [name] Agent on 11/21/23 by a audio enabled telemedicine application and verified that I am speaking with the correct person using two identifiers.  Patient Location: Home  Provider Location: Office/Clinic  I discussed the limitations of evaluation and management by telemedicine. The patient expressed understanding and agreed to proceed.  Vital Signs: Because this visit was a virtual/telehealth visit, some criteria may be missing or patient reported. Any vitals not documented were not able to be obtained and vitals that have been documented are patient reported.  VideoDeclined- This patient declined Librarian, academic. Therefore the visit was completed with audio only.  Persons Participating in Visit: Patient.  AWV Questionnaire: No: Patient Medicare AWV questionnaire was not completed prior to this visit.  Cardiac Risk Factors include: advanced age (>24men, >17 women);dyslipidemia;hypertension;male gender     Objective:    Today's Vitals   11/21/23 1012  Weight: 140 lb (63.5 kg)  Height: 5' 9 (1.753 m)   Body mass index is 20.67 kg/m.     11/21/2023   10:12 AM 03/10/2023   11:35 AM 11/16/2022   10:51 AM 06/15/2022    5:53 AM 11/12/2021   11:57 AM 11/11/2020    8:48 AM 11/26/2019    3:20 PM  Advanced Directives  Does Patient Have a Medical Advance Directive? Yes Yes Yes Yes Yes Yes No  Type of Estate agent of [place];Living will Healthcare Power of [place];Living will Healthcare Power of [place];Living will Healthcare Power of [place];Living will  Healthcare Power of [place];Living will Living will;Healthcare Power of Attorney   Does patient want to make changes to medical advance directive?  No - Patient declined  No - Patient declined No - Patient declined No - Patient declined   Copy of Healthcare Power of Attorney in Chart? No - copy requested  No - copy requested No - copy requested No - copy requested No - copy requested   Would patient like information on creating a medical advance directive?       No - Patient declined    Current Medications (verified) Outpatient Encounter Medications as of 11/21/2023  Medication Sig   ALPRAZolam  (XANAX ) 0.25 MG tablet TAKE 1 TABLET(0.25 MG) BY MOUTH TWICE DAILY AS NEEDED FOR ANXIETY   Cholecalciferol (VITAMIN D3) 50 MCG (2000 UT) capsule Take 2,000 Units by mouth daily.   clopidogrel  (PLAVIX ) 75 MG tablet Take 1 tablet (75 mg total) by mouth daily.   isosorbide  mononitrate (IMDUR ) 30 MG 24 hr tablet Take 0.5 tablets (15 mg total) by mouth daily.   lisinopril  (ZESTRIL ) 10 MG tablet TAKE 1 TABLET DAILY   metoprolol  succinate (TOPROL -XL) 50 MG 24 hr tablet TAKE 1 TABLET DAILY   nirmatrelvir /ritonavir , renal dosing, (PAXLOVID ) 10 x 150 MG & 10 x 100MG  TBPK Take nirmatrelvir  (150mg ) one tablet twice daily for 5 days and ritonavir  (100 mg) one tablet twice daily for 5 days.   nitroGLYCERIN  (NITROSTAT ) 0.4 MG SL tablet Place 1 tablet (0.4 mg total) under the tongue every 5 (five) minutes as needed for chest pain.   Polyethyl Glycol-Propyl Glycol (SYSTANE ULTRA) 0.4-0.3 % SOLN Place 1 drop  into both eyes at bedtime.   psyllium (METAMUCIL) 58.6 % packet Take 1 packet by mouth daily.   rosuvastatin  (CRESTOR ) 40 MG tablet Take 1 tablet (40 mg total) by mouth daily.   tamsulosin  (FLOMAX ) 0.4 MG CAPS capsule Take 0.8 mg by mouth every morning.   zolpidem  (AMBIEN ) 10 MG tablet TAKE 1 TABLET(10 MG) BY MOUTH AT BEDTIME AS NEEDED FOR SLEEP   No facility-administered encounter medications on file as of 11/21/2023.     Allergies (verified) Patient has no known allergies.   History: Past Medical History:  Diagnosis Date   Anxiety    CAD (coronary artery disease)    a. s/p stent to LAD 2008;  b. abnormal ETT 11/13 with VTach => LHC pLAD 30% ISR, mCFX 30%, dRCA 60-75% which had progressed from previous study in 2010 => FFR of RCA 0.92 => med Rx    Carotid stenosis    dopplers 1/14:  0-39% bilateral ICA stenosis   Colon polyps    Colon polyps    adenomatous   Diverticular disease of colon    Hemorrhoids    external and internal   History of BPH    HTN (hypertension)    Hx of echocardiogram    a. Echo 9/11:  EF 55-60%, normal motion, grade 2 diastolic dysfunction, mild MR, mild LAE, mild RAE, PASP 33.   Hyperlipidemia    IBS (irritable bowel syndrome)    Rhinitis    Stroke, lacunar (HCC) 08/24/2012   Old, 2011 head mri   Past Surgical History:  Procedure Laterality Date   APPENDECTOMY  05/17/1954   CORONARY STENT INTERVENTION N/A 06/15/2022   Procedure: CORONARY STENT INTERVENTION;  Surgeon: Dann Candyce RAMAN, MD;  Location: [facility] INVASIVE CV LAB;  Service: Cardiovascular;  Laterality: N/A;   CORONARY STENT PLACEMENT  05/17/2006    Successful PCI of the lesion in the proximal LAD using a Promus    HERNIA REPAIR Right 05/18/2003   LEFT HEART CATH AND CORONARY ANGIOGRAPHY N/A 06/15/2022   Procedure: LEFT HEART CATH AND CORONARY ANGIOGRAPHY;  Surgeon: Dann Candyce RAMAN, MD;  Location: [facility] INVASIVE CV LAB;  Service: Cardiovascular;  Laterality: N/A;   LEFT HEART CATH AND CORONARY ANGIOGRAPHY N/A 03/10/2023   Procedure: LEFT HEART CATH AND CORONARY ANGIOGRAPHY;  Surgeon: Wonda Sharper, MD;  Location: [facility] INVASIVE CV LAB;  Service: Cardiovascular;  Laterality: N/A;   NASAL SINUS SURGERY  05/17/1973   PERCUTANEOUS CORONARY STENT INTERVENTION (PCI-S) N/A 05/03/2012   Procedure: PERCUTANEOUS CORONARY STENT INTERVENTION (PCI-S);  Surgeon: Sharper Wonda, MD;  Location: [facility] CATH LAB;  Service:  Cardiovascular;  Laterality: N/A;   Promus DES ok for 3T MRI  05/17/2006   Family History  Problem Relation Age of Onset   Rheum arthritis Mother    Coronary artery disease Father    Heart attack Father    Cancer Brother        ? TYPE   Colon cancer Neg Hx    Stomach cancer Neg Hx    Pancreatic cancer Neg Hx    Social History   Socioeconomic History   Marital status: Married    Spouse name: Not on file   Number of children: 3   Years of education: 16   Highest education level: Master's degree (e.g., MA, MS, MEng, MEd, MSW, MBA)  Occupational History   Occupation: businessman, Financial planner: RETIRED  Tobacco Use   Smoking status: Never    Passive exposure: Never   Smokeless  tobacco: Never  Vaping Use   Vaping status: Never Used  Substance and Sexual Activity   Alcohol use: Yes    Alcohol/week: 1.0 standard drink of alcohol    Types: 1 Shots of liquor per week    Comment: 3 per week   Drug use: No   Sexual activity: Yes    Partners: Female  Other Topics Concern   Not on file  Social History Narrative   HSG, Engineer, 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 He has two daughters, 1 in [place] 1 in [place] (Dec '12) and a son who works with him. Several grandchildren.Reports that he spends a good deal of time at his home in [facility] which he greatly enjoys and his home in the [place]. Enjoys driving [place].      ACP - Yes CPR, yes for short-term mechanical ventilation for reversible disease. Recommended [email or web address] for consideration.   Social Drivers of Corporate investment banker Strain: Low Risk  (11/21/2023)   Overall Financial Resource Strain (CARDIA)    Difficulty of Paying Living Expenses: Not hard at all  Food Insecurity: No Food Insecurity (11/21/2023)   Hunger Vital Sign    Worried About Running Out of Food  in the Last Year: Never true    Ran Out of Food in the Last Year: Never true  Transportation Needs: No Transportation Needs (11/21/2023)   PRAPARE - Administrator, Civil Service (Medical): No    Lack of Transportation (Non-Medical): No  Physical Activity: Insufficiently Active (11/21/2023)   Exercise Vital Sign    Days of Exercise per Week: 4 days    Minutes of Exercise per Session: 30 min  Stress: No Stress Concern Present (11/21/2023)   [name] of Occupational Health - Occupational Stress Questionnaire    Feeling of Stress: Not at all  Social Connections: Socially Integrated (11/21/2023)   Social Connection and Isolation Panel    Frequency of Communication with Friends and Family: More than three times a week    Frequency of Social Gatherings with Friends and Family: More than three times a week    Attends Religious Services: More than 4 times per year    Active Member of [employer] or Organizations: Yes    Attends Banker Meetings: 1 to 4 times per year    Marital Status: Married    Tobacco Counseling Counseling given: No    Clinical Intake:  Pre-visit preparation completed: Yes  Pain : No/denies pain     BMI - recorded: 20.67 Nutritional Status: BMI of 19-24  Normal Nutritional Risks: None Diabetes: No  No results found for: HGBA1C   How often do you need to have someone help you when you read instructions, pamphlets, or other written materials from your doctor or pharmacy?: 1 - Never  Interpreter Needed?: No  Information entered by :: Verdie Saba, CMA   Activities of Daily Living     11/21/2023   10:16 AM  In your present state of health, do you have any difficulty performing the following activities:  Hearing? 1  Comment in the process of getting Hearing Aids  Vision? 0  Difficulty concentrating or making decisions? 0  Walking or climbing stairs? 0  Dressing or bathing? 0  Doing errands, shopping? 0  Preparing Food and eating  ? N  Using the Toilet? N  In the past six months,  have you accidently leaked urine? N  Do you have problems with loss of bowel control? N  Managing your Medications? N  Managing your Finances? N  Housekeeping or managing your Housekeeping? N    Patient Care Team: Joshua [name] CROME, MD as PCP - General (Internal Medicine) Delford Maude BROCKS, MD as PCP - Cardiology (Cardiology) Gaston Hamilton, MD as Consulting Physician (Urology) [facility], [facility]  I have updated your Care Teams any recent Medical Services you may have received from other providers in the past year.     Assessment:   This is a routine wellness examination for [facility].  Hearing/Vision screen Hearing Screening - Comments:: In the process of getting hearing aids Vision Screening - Comments:: Wears rx glasses - up to date with routine eye exams with [facility]    Goals Addressed               This Visit's Progress     Patient Stated (pt-stated)        Patient stated he currently has COVID and plans to recover       Depression Screen     11/21/2023   10:18 AM 02/03/2023    3:17 PM 11/16/2022   10:53 AM 11/15/2022   11:14 AM 11/12/2021   11:55 AM 11/11/2020    8:47 AM 10/20/2020    8:04 AM  PHQ 2/9 Scores  PHQ - 2 Score 0 0 0 0 0 0 0  PHQ- 9 Score 0  0 0       Fall Risk     11/21/2023   10:17 AM 10/17/2023    8:59 AM 02/03/2023    3:17 PM 11/16/2022   10:52 AM 11/12/2022   10:29 AM  Fall Risk   Falls in the past year? 0 0 0 0 0  Number falls in past yr: 0 0 0 0 0  Injury with Fall? 0  0 0 0  Risk for fall due to : No Fall Risks No Fall Risks No Fall Risks No Fall Risks   Follow up Falls evaluation completed;Falls prevention discussed Falls evaluation completed Falls evaluation completed Falls prevention discussed     MEDICARE RISK AT HOME:  Medicare Risk at Home Any stairs in or around the home?: Yes If so, are there any without handrails?: No Home free of loose throw rugs in  walkways, pet beds, electrical cords, etc?: Yes Adequate lighting in your home to reduce risk of falls?: Yes Life alert?: No Use of a cane, walker or w/c?: No Grab bars in the bathroom?: Yes Shower chair or bench in shower?: Yes Elevated toilet seat or a handicapped toilet?: Yes  TIMED UP AND GO:  Was the test performed?  No  Cognitive Function: 6CIT completed    08/03/2017    3:37 PM  MMSE - Mini Mental State Exam  Orientation to time 5  Orientation to Place 5  Registration 3  Attention/ Calculation 5  Recall 3  Language- name 2 objects 2  Language- repeat 1  Language- follow 3 step command 3  Language- read & follow direction 1  Write a sentence 1  Copy design 1  Total score 30        11/21/2023   10:19 AM 11/16/2022   10:56 AM 11/12/2021   12:01 PM 09/17/2019    8:22 AM  6CIT Screen  What Year? 0 points 0 points 0 points 0 points  What month? 0 points 0 points 0 points 0 points  What time? 0 points 0 points 0 points 0 points  Count back from 20 0 points 0 points 0 points 0 points  Months in reverse 0 points 0 points 0 points 0 points  Repeat phrase 2 points 0 points 0 points 0 points  Total Score 2 points 0 points 0 points 0 points    Immunizations Immunization History  Administered Date(s) Administered   Fluad Quad(high Dose 65+) 02/02/2019   H1N1 04/24/2008   Influenza Whole 02/27/2008, 04/28/2009, 03/01/2011   Influenza, High Dose Seasonal PF 02/06/2013, 02/21/2014, 02/17/2015, 02/10/2017, 03/09/2018   Influenza, Quadrivalent, Recombinant, Inj, Pf 02/08/2023   Influenza,inj,Quad PF,6+ Mos 02/16/2016   Influenza-Unspecified 01/16/2012, 01/16/2020, 03/02/2022   PFIZER(Purple Top)SARS-COV-2 Vaccination 05/25/2019, 06/15/2019, 01/27/2020   Pneumococcal Conjugate-13 06/26/2013, 02/21/2014, 03/02/2022   Pneumococcal Polysaccharide-23 02/27/2008, 05/22/2019   Pneumococcal-Unspecified 02/06/2013   Tdap 12/15/2011   Zoster Recombinant(Shingrix) 01/24/2018,  05/22/2018   Zoster, Live 04/28/2009, 05/10/2011, 02/17/2015    Screening Tests Health Maintenance  Topic Date Due   DTaP/Tdap/Td (2 - Td or Tdap) 12/14/2021   COVID-19 Vaccine (4 - 2024-25 season) 01/16/2023   INFLUENZA VACCINE  12/16/2023   Medicare Annual Wellness (AWV)  11/20/2024   Pneumococcal Vaccine: 50+ Years  Completed   Zoster Vaccines- Shingrix  Completed   Hepatitis B Vaccines  Aged Out   HPV VACCINES  Aged Out   Meningococcal B Vaccine  Aged Out    Health Maintenance  Health Maintenance Due  Topic Date Due   DTaP/Tdap/Td (2 - Td or Tdap) 12/14/2021   COVID-19 Vaccine (4 - 2024-25 season) 01/16/2023   Health Maintenance Items Addressed:   Pt stated he's in the process of getting hearing aids via the [facility].  Has had a hearing exam w/an Audiologist in 2025.   Additional Screening:  Vision Screening: Recommended annual ophthalmology exams for early detection of glaucoma and other disorders of the eye. Would you like a referral to an eye doctor? No    Dental Screening: Recommended annual dental exams for proper oral hygiene  Community Resource Referral / Chronic Care Management: CRR required this visit?  No   CCM required this visit?  No   Plan:    I have personally reviewed and noted the following in the patient's chart:   Medical and social history Use of alcohol, tobacco or illicit drugs  Current medications and supplements including opioid prescriptions. Patient is not currently taking opioid prescriptions. Functional ability and status Nutritional status Physical activity Advanced directives List of other physicians Hospitalizations, surgeries, and ER visits in previous 12 months Vitals Screenings to include cognitive, depression, and falls Referrals and appointments  In addition, I have reviewed and discussed with patient certain preventive protocols, quality metrics, and best practice recommendations. A written personalized care plan  for preventive services as well as general preventive health recommendations were provided to patient.   Verdie CHRISTELLA Saba, CMA   11/21/2023   After Visit Summary: (MyChart) Due to this being a telephonic visit, the after visit summary with patients personalized plan was offered to patient via MyChart   Notes: Nothing significant to report at this time.

## 2023-11-21 NOTE — Patient Instructions (Addendum)
 [name] , Thank you for taking time out of your busy schedule to complete your Annual Wellness Visit with me. I enjoyed our conversation and look forward to speaking with you again next year. I, as well as your care team,  appreciate your ongoing commitment to your health goals. Please review the following plan we discussed and let me know if I can assist you in the future. Your Game plan/ To Do List    Follow up Visits: Next Medicare AWV with our clinical staff: 11/21/2024   Have you seen your provider in the last 6 months (3 months if uncontrolled diabetes)? Yes Next Office Visit with your provider: 11/29/2023  Clinician Recommendations:  Aim for 30 minutes of exercise or brisk walking, 6-8 glasses of water, and 5 servings of fruits and vegetables each day. Educated and advised on getting the Tdap (Tetenus) vaccine in 2025 at local pharmacy.      This is a list of the screening recommended for you and due dates:  Health Maintenance  Topic Date Due   DTaP/Tdap/Td vaccine (2 - Td or Tdap) 12/14/2021   COVID-19 Vaccine (4 - 2024-25 season) 01/16/2023   Flu Shot  12/16/2023   Medicare Annual Wellness Visit  11/20/2024   Pneumococcal Vaccine for age over 13  Completed   Zoster (Shingles) Vaccine  Completed   Hepatitis B Vaccine  Aged Out   HPV Vaccine  Aged Out   Meningitis B Vaccine  Aged Out    Advanced directives: (Copy Requested) Please bring a copy of your health care power of attorney and living will to the office to be added to your chart at your convenience. You can mail to [facility] [address] [place], [place] [address] or email to [email or web address] Advance Care Planning is important because it:  [x]  Makes sure you receive the medical care that is consistent with your values, goals, and preferences  [x]  It provides guidance to your family and loved ones and reduces their decisional burden about whether or not they are making the right decisions  based on your wishes.  Follow the link provided in your after visit summary or read over the paperwork we have mailed to you to help you started getting your Advance Directives in place. If you need assistance in completing these, please reach out to us  so that we can help you!

## 2023-11-25 NOTE — Progress Notes (Signed)
 Patient ID: [name] [name], male   DOB: [birth year], 89 y.o.   MRN: [id number]     89 y.o. history of CAD. Stent to LAD in 2008.  ETT in 2013 abnormal with NSVT. Cath 05/01/12 30% ISR LaD and distal RCA 60-75% with FFR CT .92 Rx medically Myovue done 04/27/17 reviewed and normal with no ischemia study not gated due to PAC;s EF normal echo 06/25/15 55-60% LA only mildly dilated Had another myovue 04/22/20 which was normal with no ischemia EF estimtated 53%  Has Occular migraines Been going on for over 3-4 years  Seen by neurology And MRI/MRA no abnormality and EEG no epileptic foci  01/11/14  1-39% bilateral carotid disease   Has lower lumbar disc issue and bilateral hip pain Has had repeat injection by Dr Claudene Also with right [facility] joint arthritis most recent injection 09/06/21 Has had Platelet Rich Plasma injections for his hips as well   03/2020 Went to [place] to visit his cousin [name] [name] who is a famous Horticulturist, commercial in [place] has a hypochondriac for husband They moved onto [address] at [facility] last year  Daughter in [place] not working as PA but he likes her husband better Son works with him and got married for first time at age 52 to pharmacist at [facility]  Had a syncopal episode July  4th. 2022 Started with visual changes then BP low  80's with pre syncope  Felt weak for a couple hours. Had two drinks prior Not very hot out Occurred around 6 o'clock No  Chest pain, dyspnea Went to sleep and felt fine with no recurrence   Monitor: 12/29/20   showed no PAF but 8% burden atrial ectopy with self limited runs of SVT   He is building a [employer] on [address] Then will do one with a [employer] on [address]   06/11/22:   had ? Angina. Tight pain in shoulders and back of neck with exertion that dissipates with rest Reproducible with similar degrees of stress No rest pain Also has had lower BP and we told him to hold his Zestril    Discussed prudence of heart cath given known distant history of  CAD/Stent and moderate residual Dx over 10 years ago  Cath done . 06/15/22 Dr Dann patent stent in LAD 75% PLB stenosis with new stent placed EF was normal  Aggravated by red tape in starting [employer] and [employer]. Has a new place in [place]  03/08/23 having more body aches and chest pain Was in [place] walking his dog and had exertional shoulder and neck pain. Similar to pain before RCA stent He did not take nitroglycerin  for it. No rest pain We both felt repeat heart cath indicated  Cath done 03/11/23 Dr Wonda. Patient PLB/LAD stents Tightest lesion only 40% distal LAD  Called office 10/03/23 concern with chest pains Pain is a bit atypical being base of neck and across shoulders. Not always exertional He has a lot of building projects going on but stress level manageable. He was encouraged to get new nitroglycerin  and see if it helps Myovue done 10/11/23 with no ischemia EF 70%   Seen in ED 11/18/23 with COVID Had malaise and fatigue Rx with Paxlovid   BP running a bit low Discussed decreasing Toprol  to 25 mg daily He has saltines in his car if needed. He has lost some weight  ROS: Denies fever, malais, weight loss, blurry vision, decreased visual acuity, cough, sputum, SOB, hemoptysis,  pleuritic pain, palpitaitons, heartburn, abdominal pain, melena, lower extremity edema, claudication, or rash.  All other systems reviewed and negative  General: BP (!) 98/53   Pulse 76   Ht 5' 9 (1.753 m)   Wt 139 lb (63 kg)   SpO2 96%   BMI 20.53 kg/m  Affect appropriate Healthy:  appears stated age HEENT: normal Neck supple with no adenopathy JVP normal no bruits no thyromegaly Lungs clear with no wheezing and good diaphragmatic motion Heart:  S1/S2 no murmur, no rub, gallop or click PMI normal Abdomen: benighn, BS positve, no tenderness, no AAA no bruit.  No HSM or HJR Distal pulses intact with no bruits No edema Neuro non-focal Skin warm and dry No muscular  weakness     Current Outpatient Medications  Medication Sig Dispense Refill   ALPRAZolam  (XANAX ) 0.25 MG tablet TAKE 1 TABLET(0.25 MG) BY MOUTH TWICE DAILY AS NEEDED FOR ANXIETY 60 tablet 1   Cholecalciferol (VITAMIN D3) 50 MCG (2000 UT) capsule Take 2,000 Units by mouth daily.     clopidogrel  (PLAVIX ) 75 MG tablet Take 1 tablet (75 mg total) by mouth daily. 90 tablet 3   isosorbide  mononitrate (IMDUR ) 30 MG 24 hr tablet Take 0.5 tablets (15 mg total) by mouth daily. 45 tablet 3   metoprolol  succinate (TOPROL -XL) 50 MG 24 hr tablet TAKE 1 TABLET DAILY 90 tablet 3   nitroGLYCERIN  (NITROSTAT ) 0.4 MG SL tablet Place 1 tablet (0.4 mg total) under the tongue every 5 (five) minutes as needed for chest pain. 25 tablet 3   Polyethyl Glycol-Propyl Glycol (SYSTANE ULTRA) 0.4-0.3 % SOLN Place 1 drop into both eyes at bedtime.     psyllium (METAMUCIL) 58.6 % packet Take 1 packet by mouth daily.     rosuvastatin  (CRESTOR ) 40 MG tablet Take 1 tablet (40 mg total) by mouth daily. 90 tablet 3   sulfamethoxazole -trimethoprim  (BACTRIM  DS) 800-160 MG tablet Take 1 tablet by mouth 2 (two) times daily for 14 days. 28 tablet 0   tamsulosin  (FLOMAX ) 0.4 MG CAPS capsule Take 0.8 mg by mouth every morning.     zolpidem  (AMBIEN ) 10 MG tablet TAKE 1 TABLET(10 MG) BY MOUTH AT BEDTIME AS NEEDED FOR SLEEP 90 tablet 0   No current facility-administered medications for this visit.    Allergies  Patient has no known allergies.  Electrocardiogram:  12/07/2023  SR rate 70 RBBB  no changes PACls   Assessment and Plan  CAD: LAD stent in 2008 06/15/22 new stent to PLB of RCA Continue DAT Cath 03/11/23 patent stents only distal LAD 40% Shoulder/neck pain. PRN nitroglycerin . EX Myovue normal 10/11/23 EF 70% no ischemia  HLD : on generic crestor  now  Lab Results  Component Value Date   LDLCALC 51 11/29/2023    GERD:with constipation not helped with Miralax, ducolox or Linzess f/u Dr Abran Rucks:  Chronic on ambien   f/u Dr Joshua   Visual Disturbance:  MRI/MRA normal EEG normal has been seen by neurology ? Occular migraines   Ortho:  f/u Dr Claudene for bilateral hip pain post injections MRI L3,4 disc bulge as well as right AC joint arthritis   Syncope:   Normal myovue 04/22/20 Labs ok Monitor with mostly atrial ectopy continue beta blocker BP lower now again ? Related to CAD Weight stable not postural D/c Zestril  Decrease Torpol to 25 mg F/U primary regarding his weight loss    COVID:  positive 11/18/23 Rx Paxlovid  No CXR done    F/U  6  months   Maude Emmer

## 2023-11-29 ENCOUNTER — Ambulatory Visit (INDEPENDENT_AMBULATORY_CARE_PROVIDER_SITE_OTHER): Admitting: Internal Medicine

## 2023-11-29 ENCOUNTER — Ambulatory Visit: Payer: Self-pay | Admitting: Internal Medicine

## 2023-11-29 ENCOUNTER — Encounter: Payer: Self-pay | Admitting: Internal Medicine

## 2023-11-29 VITALS — BP 118/60 | HR 73 | Temp 98.1°F | Ht 69.0 in | Wt 141.0 lb

## 2023-11-29 DIAGNOSIS — H903 Sensorineural hearing loss, bilateral: Secondary | ICD-10-CM | POA: Diagnosis not present

## 2023-11-29 DIAGNOSIS — N1832 Chronic kidney disease, stage 3b: Secondary | ICD-10-CM | POA: Diagnosis not present

## 2023-11-29 DIAGNOSIS — N400 Enlarged prostate without lower urinary tract symptoms: Secondary | ICD-10-CM | POA: Diagnosis not present

## 2023-11-29 DIAGNOSIS — N138 Other obstructive and reflux uropathy: Secondary | ICD-10-CM | POA: Insufficient documentation

## 2023-11-29 DIAGNOSIS — E785 Hyperlipidemia, unspecified: Secondary | ICD-10-CM | POA: Diagnosis not present

## 2023-11-29 DIAGNOSIS — N281 Cyst of kidney, acquired: Secondary | ICD-10-CM | POA: Diagnosis not present

## 2023-11-29 DIAGNOSIS — I10 Essential (primary) hypertension: Secondary | ICD-10-CM

## 2023-11-29 LAB — CBC WITH DIFFERENTIAL/PLATELET
Basophils Absolute: 0 K/uL (ref 0.0–0.1)
Basophils Relative: 0.4 % (ref 0.0–3.0)
Eosinophils Absolute: 0.1 K/uL (ref 0.0–0.7)
Eosinophils Relative: 1.8 % (ref 0.0–5.0)
HCT: 41 % (ref 39.0–52.0)
Hemoglobin: 13.8 g/dL (ref 13.0–17.0)
Lymphocytes Relative: 26.3 % (ref 12.0–46.0)
Lymphs Abs: 1.5 K/uL (ref 0.7–4.0)
MCHC: 33.6 g/dL (ref 30.0–36.0)
MCV: 90.2 fl (ref 78.0–100.0)
Monocytes Absolute: 0.4 K/uL (ref 0.1–1.0)
Monocytes Relative: 8 % (ref 3.0–12.0)
Neutro Abs: 3.5 K/uL (ref 1.4–7.7)
Neutrophils Relative %: 63.5 % (ref 43.0–77.0)
Platelets: 201 K/uL (ref 150.0–400.0)
RBC: 4.54 Mil/uL (ref 4.22–5.81)
RDW: 14.2 % (ref 11.5–15.5)
WBC: 5.5 K/uL (ref 4.0–10.5)

## 2023-11-29 LAB — URINALYSIS, ROUTINE W REFLEX MICROSCOPIC
Bilirubin Urine: NEGATIVE
Ketones, ur: NEGATIVE
Leukocytes,Ua: NEGATIVE
Nitrite: NEGATIVE
Specific Gravity, Urine: 1.02 (ref 1.000–1.030)
Total Protein, Urine: NEGATIVE
Urine Glucose: NEGATIVE
Urobilinogen, UA: 0.2 (ref 0.0–1.0)
pH: 6 (ref 5.0–8.0)

## 2023-11-29 LAB — TSH: TSH: 2.77 u[IU]/mL (ref 0.35–5.50)

## 2023-11-29 LAB — LIPID PANEL
Cholesterol: 131 mg/dL (ref 0–200)
HDL: 57.1 mg/dL (ref >39.00)
LDL Cholesterol: 51 mg/dL (ref 0–99)
NonHDL: 73.95
Total CHOL/HDL Ratio: 2
Triglycerides: 116 mg/dL (ref 0.0–149.0)
VLDL: 23.2 mg/dL (ref 0.0–40.0)

## 2023-11-29 LAB — BASIC METABOLIC PANEL WITH GFR
BUN: 24 mg/dL — ABNORMAL HIGH (ref 6–23)
CO2: 28 meq/L (ref 19–32)
Calcium: 9.4 mg/dL (ref 8.4–10.5)
Chloride: 106 meq/L (ref 96–112)
Creatinine, Ser: 1.07 mg/dL (ref 0.40–1.50)
GFR: 61.94 mL/min (ref >60.00)
Glucose, Bld: 95 mg/dL (ref 70–99)
Potassium: 4.3 meq/L (ref 3.5–5.1)
Sodium: 140 meq/L (ref 135–145)

## 2023-11-29 LAB — HEPATIC FUNCTION PANEL
ALT: 12 U/L (ref 0–53)
AST: 15 U/L (ref 0–37)
Albumin: 4.1 g/dL (ref 3.5–5.2)
Alkaline Phosphatase: 51 U/L (ref 39–117)
Bilirubin, Direct: 0.1 mg/dL (ref 0.0–0.3)
Total Bilirubin: 0.4 mg/dL (ref 0.2–1.2)
Total Protein: 6.2 g/dL (ref 6.0–8.3)

## 2023-11-29 LAB — PSA: PSA: 1.2 ng/mL (ref 0.10–4.00)

## 2023-11-29 MED ORDER — SULFAMETHOXAZOLE-TRIMETHOPRIM 800-160 MG PO TABS: 1.0000 | ORAL_TABLET | Freq: Two times a day (BID) | ORAL | 0 refills | Status: DC

## 2023-11-29 NOTE — Progress Notes (Signed)
 Subjective:  Patient ID: [name] [name], male    DOB: Aug 09, 1932  Age: 89 y.o. MRN: [id number]  CC: Hypertension   HPI [name] [name] presents for f/up ----  Discussed the use of AI scribe software for clinical note transcription with the patient, who gave verbal consent to proceed.  History of Present Illness   [name] is an 89 year old male who presents with concerns about kidney function and low blood pressure.  He has kidney cysts and experiences occasional mild right flank pain, which occurs sporadically. He takes Tylenol  for pain relief, which effectively manages the discomfort. He is scheduled to visit the [facility] for further evaluation of his kidney function.  He has a history of low blood pressure, with diastolic readings down to 44 mmHg. He experiences occasional lightheadedness, which he attributes to this low blood pressure. He is currently taking lisinopril . No cough or wheezing associated with lisinopril  use.  He recently recovered from COVID-19 without complications. He underwent a hearing test recently and was found to be totally deaf in one ear and 30% deaf in the other ear.   No nausea, vomiting, dizziness, or wheezing.       Outpatient Medications Prior to Visit  Medication Sig Dispense Refill   ALPRAZolam  (XANAX ) 0.25 MG tablet TAKE 1 TABLET(0.25 MG) BY MOUTH TWICE DAILY AS NEEDED FOR ANXIETY 60 tablet 1   Cholecalciferol (VITAMIN D3) 50 MCG (2000 UT) capsule Take 2,000 Units by mouth daily.     clopidogrel  (PLAVIX ) 75 MG tablet Take 1 tablet (75 mg total) by mouth daily. 90 tablet 3   isosorbide  mononitrate (IMDUR ) 30 MG 24 hr tablet Take 0.5 tablets (15 mg total) by mouth daily. 45 tablet 3   metoprolol  succinate (TOPROL -XL) 50 MG 24 hr tablet TAKE 1 TABLET DAILY 90 tablet 3   nitroGLYCERIN  (NITROSTAT ) 0.4 MG SL tablet Place 1 tablet (0.4 mg total) under the tongue every 5 (five) minutes as needed for chest pain. 25 tablet 3    Polyethyl Glycol-Propyl Glycol (SYSTANE ULTRA) 0.4-0.3 % SOLN Place 1 drop into both eyes at bedtime.     psyllium (METAMUCIL) 58.6 % packet Take 1 packet by mouth daily.     rosuvastatin  (CRESTOR ) 40 MG tablet Take 1 tablet (40 mg total) by mouth daily. 90 tablet 3   tamsulosin  (FLOMAX ) 0.4 MG CAPS capsule Take 0.8 mg by mouth every morning.     zolpidem  (AMBIEN ) 10 MG tablet TAKE 1 TABLET(10 MG) BY MOUTH AT BEDTIME AS NEEDED FOR SLEEP 90 tablet 0   lisinopril  (ZESTRIL ) 10 MG tablet TAKE 1 TABLET DAILY 90 tablet 1   nirmatrelvir /ritonavir , renal dosing, (PAXLOVID ) 10 x 150 MG & 10 x 100MG  TBPK Take nirmatrelvir  (150mg ) one tablet twice daily for 5 days and ritonavir  (100 mg) one tablet twice daily for 5 days. 20 tablet 0   No facility-administered medications prior to visit.    ROS Review of Systems  Constitutional:  Negative for appetite change, chills, diaphoresis and fatigue.  HENT:  Positive for hearing loss. Negative for ear pain.   Eyes: Negative.   Respiratory:  Negative for cough, chest tightness, shortness of breath and wheezing.   Cardiovascular:  Negative for chest pain, palpitations and leg swelling.  Gastrointestinal: Negative.  Negative for abdominal pain, constipation, diarrhea, nausea and vomiting.  Endocrine: Negative.   Genitourinary: Negative.  Negative for difficulty urinating, dysuria and hematuria.  Musculoskeletal: Negative.  Negative for arthralgias and myalgias.  Skin:  Negative.   Neurological: Negative.  Negative for dizziness and weakness.  Hematological:  Negative for adenopathy. Does not bruise/bleed easily.  Psychiatric/Behavioral: Negative.      Objective:  BP 118/60 (BP Location: Left Arm, Patient Position: Sitting, Cuff Size: Normal)   Pulse 73   Temp 98.1 F (36.7 C) (Oral)   Ht 5' 9 (1.753 m)   Wt 141 lb (64 kg)   SpO2 93%   BMI 20.82 kg/m   BP Readings from Last 3 Encounters:  11/29/23 118/60  11/18/23 (!) 87/52  10/19/23 120/82    Wt  Readings from Last 3 Encounters:  11/29/23 141 lb (64 kg)  11/21/23 140 lb (63.5 kg)  10/19/23 143 lb (64.9 kg)    Physical Exam Vitals reviewed.  HENT:     Nose: Nose normal.     Mouth/Throat:     Mouth: Mucous membranes are moist.  Eyes:     General: No scleral icterus.    Conjunctiva/sclera: Conjunctivae normal.  Cardiovascular:     Rate and Rhythm: Normal rate and regular rhythm.     Heart sounds: No murmur heard.    No friction rub. No gallop.  Pulmonary:     Effort: Pulmonary effort is normal.     Breath sounds: No stridor. No wheezing, rhonchi or rales.  Abdominal:     General: Abdomen is flat.     Palpations: There is no mass.     Tenderness: There is no abdominal tenderness. There is no guarding.     Hernia: No hernia is present.  Musculoskeletal:        General: Normal range of motion.     Cervical back: Neck supple.     Right lower leg: No edema.     Left lower leg: No edema.  Lymphadenopathy:     Cervical: No cervical adenopathy.  Skin:    General: Skin is warm and dry.  Neurological:     General: No focal deficit present.     Mental Status: He is alert.  Psychiatric:        Mood and Affect: Mood normal.        Behavior: Behavior normal.     Lab Results  Component Value Date   WBC 5.5 11/29/2023   HGB 13.8 11/29/2023   HCT 41.0 11/29/2023   PLT 201.0 11/29/2023   GLUCOSE 95 11/29/2023   CHOL 131 11/29/2023   TRIG 116.0 11/29/2023   HDL 57.10 11/29/2023   LDLDIRECT 132.2 03/10/2007   LDLCALC 51 11/29/2023   ALT 12 11/29/2023   AST 15 11/29/2023   NA 140 11/29/2023   K 4.3 11/29/2023   CL 106 11/29/2023   CREATININE 1.07 11/29/2023   BUN 24 (H) 11/29/2023   CO2 28 11/29/2023   TSH 2.77 11/29/2023   PSA 1.20 11/29/2023   INR 1.0 04/27/2012   US  LIMITED JOINT SPACE STRUCTURES UP RIGHT(NO LINKED CHARGES) Result Date: 10/26/2023 Limited muscular skeletal ultrasound was performed and interpreted by CLAUDENE HUSSAR, M Limited ReSound shows what  appears to be an acute displaced by approximately 25% medial meniscal tear.  Trace hypoechoic changes of the patellofemoral joint also noted.   US  Renal Result Date: 10/19/2023 CLINICAL DATA:  Right upper pole renal lesion. EXAM: RENAL / URINARY TRACT ULTRASOUND COMPLETE COMPARISON:  CT scan of December 30, 2016. FINDINGS: Right Kidney: Renal measurements: 10.5 x 4.9 x 4.7 cm = volume: 127 mL. Two simple cysts are noted, the largest measuring 3 cm in upper pole. Echogenicity  within normal limits. No mass or hydronephrosis visualized. Left Kidney: Renal measurements: 9.1 x 5.3 x 5.1 cm = volume: 129 mL. 1.7 cm simple cyst is noted in lower pole. Echogenicity within normal limits. No mass or hydronephrosis visualized. Bladder: Appears normal for degree of bladder distention. Other: Moderately enlarged prostate gland is noted. IMPRESSION: Bilateral simple renal cysts are noted. No hydronephrosis or renal obstruction is noted. Moderate prostatic enlargement. Electronically Signed   By: Lynwood Landy Raddle M.D.   On: 10/19/2023 16:18   MYOCARDIAL PERFUSION/CT RAD READ Result Date: 10/15/2023 CLINICAL DATA:  This over-read does not include interpretation of cardiac or coronary anatomy or pathology. The cardiac SPECT CT interpretation by the cardiologist is attached. COMPARISON:  None Available. FINDINGS: Vascular: Aortic atherosclerosis. Three-vessel coronary artery calcifications. Mediastinum/Nodes: No pathologically enlarged mediastinal, hilar or axillary lymph nodes. Small hiatal hernia. Lungs/Pleura: Hypoventilatory change in the dependent lungs. Motion degraded examination the lungs. Upper Abdomen: 2.8 cm right upper pole renal lesion measures Hounsfield units greater than that expected for a simple cyst. 19 mm left renal lesion measures Hounsfield units of fluid compatible with a cyst. Cholelithiasis. Musculoskeletal: Multilevel degenerative change of the spine. IMPRESSION: 1. 2.8 cm right upper pole renal lesion  measures Hounsfield units greater than that expected for a simple cyst. Recommend further evaluation with renal ultrasound. 2. Cholelithiasis. 3. Small hiatal hernia. 4.  Aortic Atherosclerosis (ICD10-I70.0). Electronically Signed   By: Reyes Holder M.D.   On: 10/15/2023 09:20   MYOCARDIAL PERFUSION IMAGING Result Date: 10/11/2023   The study is normal. The study is low risk.   LV perfusion is normal. There is no evidence of ischemia. There is no evidence of infarction.   Left ventricular function is normal. Nuclear stress EF: 70%. The left ventricular ejection fraction is hyperdynamic (>65%). End diastolic cavity size is normal.   Average exercise capacity (4:36 min:s; 4.6 METS). Normal HR/BP response to exercise.   Coronary calcium  assessment not performed due to prior revascularization.     Assessment & Plan:   Essential hypertension- BP is over-controlled. Will discontinue the ACEI. -     TSH; Future -     Urinalysis, Routine w reflex microscopic; Future -     CBC with Differential/Platelet; Future -     Basic metabolic panel with GFR; Future -     Hepatic function panel; Future -     AMB Referral [facility]I Care Management  Sensorineural hearing loss (SNHL) of both ears -     Ambulatory referral to Audiology  Benign prostatic hyperplasia without lower urinary tract symptoms -     PSA; Future -     Urinalysis, Routine w reflex microscopic; Future  Hyperlipidemia with target LDL less than 70 -     Lipid panel; Future -     Hepatic function panel; Future  Stage 3b chronic kidney disease (HCC)- Will avoid nephrotoxic agents   Infected kidney cyst -     AMB Referral [facility]BCI Care Management -     Sulfamethoxazole -Trimethoprim ; Take 1 tablet by mouth 2 (two) times daily for 14 days.  Dispense: 28 tablet; Refill: 0     Follow-up: Return in about 4 months (around 03/31/2024).  Debby Molt, MD

## 2023-11-29 NOTE — Progress Notes (Unsigned)
 [name] [address] [place] [address] Phone: [phone] Subjective:   [name], am serving as a scribe for Dr. Arthea Claudene.  I'm seeing this patient by the request  of:  Joshua Debby CROME, MD  CC: Right shoulder and right knee pain follow-up, new shoulder pain  YEP:Dlagzrupcz  6/4/20235 Acute problem with worsening discomfort and pain.  Discussed icing regimen and home exercises, discussed which activities to do and which ones to avoid.  We discussed avoiding twisting motions.  Given injection today which tolerated the procedure well.  No underlying arthritis that we will need to monitor as well.  Follow-up again in 6 to 8 weeks otherwise.      Update 11/30/2023 [name] [name] is a 89 y.o. male coming in with complaint of R shoulder and R knee pain. Patient states knee and shoulder doing fine. Now having some tightness and discomfort that is constant between the shoulder blades. Taking tylenol  seems to help a little.      Past Medical History:  Diagnosis Date   Anxiety    CAD (coronary artery disease)    a. s/p stent to LAD 2008;  b. abnormal ETT 11/13 with VTach => LHC pLAD 30% ISR, mCFX 30%, dRCA 60-75% which had progressed from previous study in 2010 => FFR of RCA 0.92 => med Rx    Carotid stenosis    dopplers 1/14:  0-39% bilateral ICA stenosis   Colon polyps    Colon polyps    adenomatous   Diverticular disease of colon    Hemorrhoids    external and internal   History of BPH    HTN (hypertension)    Hx of echocardiogram    a. Echo 9/11:  EF 55-60%, normal motion, grade 2 diastolic dysfunction, mild MR, mild LAE, mild RAE, PASP 33.   Hyperlipidemia    IBS (irritable bowel syndrome)    Rhinitis    Stroke, lacunar (HCC) 08/24/2012   Old, 2011 head mri   Past Surgical History:  Procedure Laterality Date   APPENDECTOMY  05/17/1954   CORONARY STENT INTERVENTION N/A 06/15/2022   Procedure: CORONARY STENT INTERVENTION;   Surgeon: Dann Candyce RAMAN, MD;  Location: [facility] INVASIVE CV LAB;  Service: Cardiovascular;  Laterality: N/A;   CORONARY STENT PLACEMENT  05/17/2006    Successful PCI of the lesion in the proximal LAD using a Promus    HERNIA REPAIR Right 05/18/2003   LEFT HEART CATH AND CORONARY ANGIOGRAPHY N/A 06/15/2022   Procedure: LEFT HEART CATH AND CORONARY ANGIOGRAPHY;  Surgeon: Dann Candyce RAMAN, MD;  Location: [facility] INVASIVE CV LAB;  Service: Cardiovascular;  Laterality: N/A;   LEFT HEART CATH AND CORONARY ANGIOGRAPHY N/A 03/10/2023   Procedure: LEFT HEART CATH AND CORONARY ANGIOGRAPHY;  Surgeon: Wonda Sharper, MD;  Location: [facility] INVASIVE CV LAB;  Service: Cardiovascular;  Laterality: N/A;   NASAL SINUS SURGERY  05/17/1973   PERCUTANEOUS CORONARY STENT INTERVENTION (PCI-S) N/A 05/03/2012   Procedure: PERCUTANEOUS CORONARY STENT INTERVENTION (PCI-S);  Surgeon: Sharper Wonda, MD;  Location[facility] CATH LAB;  Service: Cardiovascular;  Laterality: N/A;   Promus DES ok for 3T MRI  05/17/2006   Social History   Socioeconomic History   Marital status: Married    Spouse name: Not on file   Number of children: 3   Years of education: 16   Highest education level: Bachelor's degree (e.g., BA, AB, BS)  Occupational History   Occupation: businessman, Engineer, civil (consulting)  Employer: RETIRED  Tobacco Use   Smoking status: Never    Passive exposure: Never   Smokeless tobacco: Never  Vaping Use   Vaping status: Never Used  Substance and Sexual Activity   Alcohol use: Yes    Alcohol/week: 1.0 standard drink of alcohol    Types: 1 Shots of liquor per week    Comment: 3 per week   Drug use: No   Sexual activity: Yes    Partners: Female  Other Topics Concern   Not on file  Social History Narrative   HSG, Engineer, 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 He has two daughters, 1 in [place] 1 in [place] (Dec '12) and a son  who works with him. Several grandchildren.Reports that he spends a good deal of time at his home in [facility] [place] [facility] which he greatly enjoys and his home in the [place]. Enjoys driving his BMW [place] when [place].      ACP - Yes CPR, yes for short-term mechanical ventilation for reversible disease. Recommended [email or web address] for consideration.   Social Drivers of Corporate investment banker Strain: Low Risk  (11/28/2023)   Overall Financial Resource Strain (CARDIA)    Difficulty of Paying Living Expenses: Not hard at all  Food Insecurity: No Food Insecurity (11/28/2023)   Hunger Vital Sign    Worried About Running Out of Food in the Last Year: Never true    Ran Out of Food in the Last Year: Never true  Transportation Needs: No Transportation Needs (11/28/2023)   PRAPARE - Administrator, Civil Service (Medical): No    Lack of Transportation (Non-Medical): No  Physical Activity: Sufficiently Active (11/28/2023)   Exercise Vital Sign    Days of Exercise per Week: 4 days    Minutes of Exercise per Session: 60 min  Recent Concern: Physical Activity - Insufficiently Active (11/21/2023)   Exercise Vital Sign    Days of Exercise per Week: 4 days    Minutes of Exercise per Session: 30 min  Stress: No Stress Concern Present (11/28/2023)   [name] of Occupational Health - Occupational Stress Questionnaire    Feeling of Stress: Not at all  Social Connections: Socially Integrated (11/28/2023)   Social Connection and Isolation Panel    Frequency of Communication with Friends and Family: More than three times a week    Frequency of Social Gatherings with Friends and Family: Once a week    Attends Religious Services: More than 4 times per year    Active Member of [employer] or Organizations: Yes    Attends Banker Meetings: 1 to 4 times per year    Marital Status: Married   No Known Allergies Family History  Problem Relation Age of Onset   Rheum  arthritis Mother    Coronary artery disease Father    Heart attack Father    Cancer Brother        ? TYPE   Colon cancer Neg Hx    Stomach cancer Neg Hx    Pancreatic cancer Neg Hx      Current Outpatient Medications (Cardiovascular):    isosorbide  mononitrate (IMDUR ) 30 MG 24 hr tablet, Take 0.5 tablets (15 mg total) by mouth daily.   metoprolol  succinate (TOPROL -XL) 50 MG 24 hr tablet, TAKE 1 TABLET DAILY   nitroGLYCERIN  (NITROSTAT ) 0.4 MG SL tablet, Place 1 tablet (0.4 mg total) under the tongue every 5 (  five) minutes as needed for chest pain.   rosuvastatin  (CRESTOR ) 40 MG tablet, Take 1 tablet (40 mg total) by mouth daily.    Current Outpatient Medications (Hematological):    clopidogrel  (PLAVIX ) 75 MG tablet, Take 1 tablet (75 mg total) by mouth daily.  Current Outpatient Medications (Other):    ALPRAZolam  (XANAX ) 0.25 MG tablet, TAKE 1 TABLET(0.25 MG) BY MOUTH TWICE DAILY AS NEEDED FOR ANXIETY   Cholecalciferol (VITAMIN D3) 50 MCG (2000 UT) capsule, Take 2,000 Units by mouth daily.   Polyethyl Glycol-Propyl Glycol (SYSTANE ULTRA) 0.4-0.3 % SOLN, Place 1 drop into both eyes at bedtime.   psyllium (METAMUCIL) 58.6 % packet, Take 1 packet by mouth daily.   sulfamethoxazole -trimethoprim  (BACTRIM  DS) 800-160 MG tablet, Take 1 tablet by mouth 2 (two) times daily for 14 days.   tamsulosin  (FLOMAX ) 0.4 MG CAPS capsule, Take 0.8 mg by mouth every morning.   zolpidem  (AMBIEN ) 10 MG tablet, TAKE 1 TABLET(10 MG) BY MOUTH AT BEDTIME AS NEEDED FOR SLEEP   Reviewed prior external information including notes and imaging from  primary care provider As well as notes that were available from care everywhere and other healthcare systems.  Past medical history, social, surgical and family history all reviewed in electronic medical record.  No pertanent information unless stated regarding to the chief complaint.   Review of Systems:  No headache, visual changes, nausea, vomiting, diarrhea,  constipation, dizziness, abdominal pain, skin rash, fevers, chills, night sweats, weight loss, swollen lymph nodes, body aches, joint swelling, chest pain, shortness of breath, mood changes. POSITIVE muscle aches  Objective  Blood pressure 126/64, pulse 75, height 5' 9 (1.753 m), weight 143 lb (64.9 kg), SpO2 96%.   General: No apparent distress alert and oriented x3 mood and affect normal, dressed appropriately.  HEENT: Pupils equal, extraocular movements intact  Respiratory: Patient's speak in full sentences and does not appear short of breath  Cardiovascular: No lower extremity edema, non tender, no erythema  Arthritic changes of multiple joints been doing well overall.  Patient does have multiple trigger points in the rhomboid, trapezius and levator scapula on the left side.  After verbal consent patient was prepped with alcohol swab and with a 25-gauge half inch needle injected with 3 cc of 0.5% Marcaine and 1 cc of Kenalog  40 mg/mL and 3 distinct trigger points in the rhomboid, trapezius, and latissimus dorsi musculature on the left shoulder.  No blood loss, Band-Aid placed.  Postinjection instructions given.    Impression and Recommendations:

## 2023-11-29 NOTE — Patient Instructions (Signed)
 Chronic Kidney Disease in Adults: What to Know Chronic kidney disease (CKD) is when lasting damage happens to the kidneys slowly over time. The kidneys are two organs that do many important things in the body. These include: Taking waste and extra fluid out of the blood to make pee (urine). Making hormones. Keeping the right amount of fluids and chemicals in the body. A small amount of kidney damage may not cause problems. You must take steps to help keep the kidney damage from getting worse. A lot of damage may cause kidney failure. Kidney failure means the kidneys can no longer work right. What are the causes? Diabetes. High blood pressure. Diseases that affect the heart and blood vessels. Other kidney diseases. Diseases that affect the body's defense system (immune system). A problem with the flow of pee. This may be caused by: Kidney stones. Cancer. An enlarged prostate, in males. A kidney infection or urinary tract infection (UTI) that keeps coming back. What increases the risk? Getting older. The chances of having CKD increase with age. A family history of kidney disease or kidney failure. Having a disease caused by genes. Taking medicines that can harm the kidneys. Being near or having contact with harmful substances. Being very overweight. Using tobacco now or in the past. What are the signs or symptoms? Common symptoms of CKD include: Feeling very tired and having less energy. Swelling of the face, legs, ankles, or feet. Throwing up or feeling like you may throw up. Not wanting to eat as much as normal. Being confused or not able to focus. Twitches and cramps in the leg muscles or other muscles. Dry, itchy skin. Other symptoms may include: Shortness of breath. Trouble sleeping. Making less pee, or making more pee, especially at night. A taste of metal in your mouth. You may also become anemic. Anemia means there's not enough red blood cells in your blood. You may get  symptoms slowly. You may not notice them until the kidney damage gets very bad. How is this diagnosed? CKD may be diagnosed based on: Tests on your blood or pee. Imaging tests, like an ultrasound or a CT scan. A kidney biopsy. For this test, a sample of kidney tissue is removed to be looked at under a microscope. These tests will help to find out how serious the CKD is. How is this treated? Often, there's no cure for CKD. Treatment can help with symptoms and help keep the disease from getting worse. Treatment may include: Treating other problems that are causing your CKD or making it worse. Diet changes. You may need to: Avoid alcohol. Avoid foods that are high in salt, potassium, phosphorous, and protein. Taking medicines for symptoms and to help control other conditions. Dialysis. This treatment gets harmful waste out of your body. It may be needed if you have kidney failure. Follow these instructions at home: Medicines Take your medicines only as told. The amount of some medicines you take may need to be changed. Do not take any new medicines, vitamins, or supplements unless your health care provider says it's okay. These may make kidney damage worse. Lifestyle Do not smoke, vape, or use nicotine or tobacco. If you drink alcohol: Limit how much you have to: 0-1 drink a day if you're male. 0-2 drinks a day if you're male. Know how much alcohol is in your drink. In the [place] one drink is one 12 oz bottle of beer (355 mL), one 5 oz glass of wine (148 mL), or one 1 oz  glass of hard liquor (44 mL). Stay at a healthy weight. If you need help, ask your provider. General instructions  Eat and drink as told. Track your blood pressure at home. Tell your provider about any changes. If you have diabetes, track your blood sugar as told. Exercise at least 30 minutes a day, 5 days a week. Keep your shots (vaccinations) up to date. Keep all follow-up visits. Your provider may need to change  your treatments over time. Where to find support American Kidney Fund: [email or web address].[email or web address] Kidney School: kidneyschool.org American Association of Kidney Patients: https://www.miller-montoya.com/ Where to find more information National Kidney Foundation: kidney.org Centers for Disease Control and Prevention. To learn more: Go to DiningCalendar.de. Click "Search". Type "chronic kidney disease" in the search box. Contact a health care provider if: You have new symptoms. You get symptoms of end-stage kidney disease. These include: Headaches. Numbness in your hands or feet. Leg cramps. Easy bruising. Get help right away if: You have a fever. You make less pee than usual. You have pain or bleeding when you pee or poop. You have chest pain. You have shortness of breath. These symptoms may be an emergency. Call 911 right away. Do not wait to see if the symptoms will go away. Do not drive yourself to the hospital. This information is not intended to replace advice given to you by your health care provider. Make sure you discuss any questions you have with your health care provider. Document Revised: 03/15/2023 Document Reviewed: 11/05/2022 Elsevier Patient Education  2024 [employer].

## 2023-11-30 ENCOUNTER — Ambulatory Visit (INDEPENDENT_AMBULATORY_CARE_PROVIDER_SITE_OTHER)

## 2023-11-30 ENCOUNTER — Ambulatory Visit: Admitting: Family Medicine

## 2023-11-30 ENCOUNTER — Encounter: Payer: Self-pay | Admitting: Family Medicine

## 2023-11-30 VITALS — BP 126/64 | HR 75 | Ht 69.0 in | Wt 143.0 lb

## 2023-11-30 DIAGNOSIS — M546 Pain in thoracic spine: Secondary | ICD-10-CM

## 2023-11-30 DIAGNOSIS — M25512 Pain in left shoulder: Secondary | ICD-10-CM | POA: Diagnosis not present

## 2023-11-30 NOTE — Assessment & Plan Note (Signed)
 Patient given injections and tolerated the procedure well, discussed icing regimen of home exercises, discussed with patient if any chest pain, shortness of breath to seek medical attention immediately.  Increase activity slowly.  Continue to be active where possible.  Will follow-up again in 6 to 8 weeks

## 2023-11-30 NOTE — Patient Instructions (Addendum)
 Trigger point injections today See you again in 2-3 months Xray today Good to see you!

## 2023-12-01 DIAGNOSIS — N182 Chronic kidney disease, stage 2 (mild): Secondary | ICD-10-CM | POA: Diagnosis not present

## 2023-12-01 DIAGNOSIS — N400 Enlarged prostate without lower urinary tract symptoms: Secondary | ICD-10-CM | POA: Diagnosis not present

## 2023-12-01 DIAGNOSIS — I129 Hypertensive chronic kidney disease with stage 1 through stage 4 chronic kidney disease, or unspecified chronic kidney disease: Secondary | ICD-10-CM | POA: Diagnosis not present

## 2023-12-01 DIAGNOSIS — N1831 Chronic kidney disease, stage 3a: Secondary | ICD-10-CM | POA: Diagnosis not present

## 2023-12-01 DIAGNOSIS — I2510 Atherosclerotic heart disease of native coronary artery without angina pectoris: Secondary | ICD-10-CM | POA: Diagnosis not present

## 2023-12-02 ENCOUNTER — Telehealth: Payer: Self-pay

## 2023-12-02 NOTE — Progress Notes (Signed)
 Care Guide Pharmacy Note  12/02/2023 Name: [name] MRN: [id number] DOB: [birth year]  Referred By: Joshua Debby CROME, MD Reason for referral: Complex Care Management (Outreach to schedule with Pharm d )   [name] [name] is a 89 y.o. year old male who is a primary care patient of Avraj, Lindroth, MD.  [name] was referred to the pharmacist for assistance related to: HTN  Successful contact was made with the patient to discuss pharmacy services including being ready for the pharmacist to call at least 5 minutes before the scheduled appointment time and to have medication bottles and any blood pressure readings ready for review. The patient agreed to meet with the pharmacist via telephone visit on (date/time).12/16/2023  Jeoffrey Buffalo , RMA     [place]  [facility], [place] [facility] Guide  Direct Dial: [phone]  Website: .[email or web address]

## 2023-12-04 ENCOUNTER — Ambulatory Visit: Payer: Self-pay | Admitting: Family Medicine

## 2023-12-07 ENCOUNTER — Ambulatory Visit: Attending: Cardiovascular Disease | Admitting: Cardiovascular Disease

## 2023-12-07 ENCOUNTER — Encounter: Payer: Self-pay | Admitting: Cardiovascular Disease

## 2023-12-07 VITALS — BP 98/53 | HR 76 | Ht 69.0 in | Wt 139.0 lb

## 2023-12-07 DIAGNOSIS — E785 Hyperlipidemia, unspecified: Secondary | ICD-10-CM | POA: Insufficient documentation

## 2023-12-07 DIAGNOSIS — I25118 Atherosclerotic heart disease of native coronary artery with other forms of angina pectoris: Secondary | ICD-10-CM | POA: Diagnosis not present

## 2023-12-07 DIAGNOSIS — R55 Syncope and collapse: Secondary | ICD-10-CM | POA: Diagnosis not present

## 2023-12-07 DIAGNOSIS — Z955 Presence of coronary angioplasty implant and graft: Secondary | ICD-10-CM | POA: Diagnosis not present

## 2023-12-07 MED ORDER — METOPROLOL SUCCINATE ER 25 MG PO TB24: 25.0000 mg | ORAL_TABLET | Freq: Every day | ORAL | 3 refills | Status: AC

## 2023-12-07 NOTE — Patient Instructions (Addendum)
 Medication Instructions:  Your physician has recommended you make the following change in your medication:  1-Decrease Metoprolol  25 mg by mouth daily.   *If you need a refill on your cardiac medications before your next appointment, please call your pharmacy*  Lab Work: If you have labs (blood work) drawn today and your tests are completely normal, you will receive your results only by: MyChart Message (if you have MyChart) OR A paper copy in the mail If you have any lab test that is abnormal or we need to change your treatment, we will call you to review the results.  Testing/Procedures: None ordered today  Follow-Up: At [facility], you and your health needs are our priority.  As part of our continuing mission to provide you with exceptional heart care, our providers are all part of one team.  This team includes your primary Cardiologist (physician) and Advanced Practice Providers or APPs (Physician Assistants and Nurse Practitioners) who all work together to provide you with the care you need, when you need it.  Your next appointment:   6 month(s)  Provider:   Maude Emmer, MD    We recommend signing up for the patient portal called MyChart.  Sign up information is provided on this After Visit Summary.  MyChart is used to connect with patients for Virtual Visits (Telemedicine).  Patients are able to view lab/test results, encounter notes, upcoming appointments, etc.  Non-urgent messages can be sent to your provider as well.   To learn more about what you can do with MyChart, go to [email or web address].

## 2023-12-12 ENCOUNTER — Ambulatory Visit: Payer: Self-pay

## 2023-12-12 ENCOUNTER — Other Ambulatory Visit: Payer: Self-pay | Admitting: Internal Medicine

## 2023-12-12 DIAGNOSIS — N281 Cyst of kidney, acquired: Secondary | ICD-10-CM

## 2023-12-12 MED ORDER — CEFPODOXIME PROXETIL 200 MG PO TABS: 200.0000 mg | ORAL_TABLET | Freq: Two times a day (BID) | ORAL | 0 refills | Status: AC

## 2023-12-12 NOTE — Telephone Encounter (Signed)
 Pt was prescribed sulfamethoxazole -trimethoprim  (BACTRIM  DS) 800-160 MG tablet and has been experiencing lots of side effects like confusion, lightheadedness, dizziness, and is unable to pass urine, neck and back pain, stopped taking the antibiotic 5 days ago.    **requesting different antibiotics due to side effects of Bactrim 

## 2023-12-12 NOTE — Telephone Encounter (Signed)
 FYI Only or Action Required?: Action required by provider: requesting different antibiotics due to side effects of Bactrim .  Patient was last seen in primary care on 11/29/2023 by Joshua Debby CROME, MD.  Called Nurse Triage reporting Medication Reaction.  Symptoms began 12/02/23.  Interventions attempted: Other: Patient stopped taking the antibiotic 5 days ago.  Symptoms are: rapidly improving.  Triage Disposition: Call PCP Now  Patient/caregiver understands and will follow disposition?: Yes     Copied from CRM [id number]. Topic: Clinical - Red Word Triage >> Dec 12, 2023 11:19 AM Jasmin G wrote: [facility] that prompted transfer to Nurse Triage: Pt was prescribed sulfamethoxazole -trimethoprim  (BACTRIM  DS) 800-160 MG tablet and has been experiencing lots of side effects like confusion, lightheadedness, dizziness, and is unable to pass urine, neck and back pain,      Reason for Disposition  [1] Caller has URGENT medicine question about med that primary care doctor (or NP/PA) or specialist prescribed AND [2] triager unable to answer question  Answer Assessment - Initial Assessment Questions Patient stopped taking the medication 5 days ago and he states he is now feeling back to his baseline. Patient would like a different antibiotic prescribed if possible. Please advise.     1. NAME of MEDICINE: What medicine(s) are you calling about?     Bactrim  2. QUESTION: What is your question? (e.g., double dose of medicine, side effect)     Side effects  3. PRESCRIBER: Who prescribed the medicine? Reason: if prescribed by specialist, call should be referred to that group.     Dr. Joshua 4. SYMPTOMS: Do you have any symptoms? If Yes, ask: What symptoms are you having?  How bad are the symptoms (e.g., mild, moderate, severe)     Dizziness, confusion, back pain, neck pain, difficulty passing urine  Protocols used: Medication Question Call-A-AH

## 2023-12-13 DIAGNOSIS — H9192 Unspecified hearing loss, left ear: Secondary | ICD-10-CM | POA: Diagnosis not present

## 2023-12-16 ENCOUNTER — Encounter: Payer: Self-pay | Admitting: Internal Medicine

## 2023-12-16 ENCOUNTER — Other Ambulatory Visit: Admitting: Pharmacist

## 2023-12-16 DIAGNOSIS — I10 Essential (primary) hypertension: Secondary | ICD-10-CM

## 2023-12-16 DIAGNOSIS — N281 Cyst of kidney, acquired: Secondary | ICD-10-CM

## 2023-12-16 NOTE — Progress Notes (Signed)
 12/16/2023 Name: [name] [name] MRN: [id number] DOB: 08-04-1932  Chief Complaint  Patient presents with   Hypertension   Medication Management    [name] [name] is a 89 y.o. year old male who presented for a telephone visit.   They were referred to the pharmacist by their PCP for assistance in managing hypertension and kidney cyst infection.   Subjective:  Care Team: Primary Care Provider: Joshua Debby CROME, MD ; Next Scheduled Visit: not scheduled  Medication Access/Adherence  Current Pharmacy:  [id number] [place], [place] - [address] AT [facility] [address] [facility] [address] [place] [address] Phone: [phone] Fax: [phone]   Patient reports affordability concerns with their medications: No  Patient reports access/transportation concerns to their pharmacy: No  Patient reports adherence concerns with their medications:  No    Kidney cyst infection: Pt is currently taking cefpodoxime  200 mg BID which is he notes he is tolerating much better. Pt took 7 days of Bactrim  and discontinued due to side effects of confusion, dizziness, unable to pass urine.  Pt notes he is feeling much better with no reported issues  Hypertension:  Current medications: metoprolol  succinate 25 mg daily (decreased from 50 mg by Dr. Delford), lisinopril  10 mg daily  **PCP discontinued lisinopril  at 7/15 appt due to hypotension however pt has continued taking. He notes his BP improved after reducing metoprolol .  Patient has a validated, automated, upper arm home BP cuff Current blood pressure readings: Checks BP TID - diastolic 60s, systolic 120s, 120/62  Patient denies hypotensive s/sx including dizziness, lightheadedness.  Patient denies hypertensive symptoms including headache, chest pain, shortness of breath    Objective:  No results found for: HGBA1C  Lab Results  Component Value Date   CREATININE 1.07 11/29/2023   BUN 24 (H)  11/29/2023   NA 140 11/29/2023   K 4.3 11/29/2023   CL 106 11/29/2023   CO2 28 11/29/2023    Lab Results  Component Value Date   CHOL 131 11/29/2023   HDL 57.10 11/29/2023   LDLCALC 51 11/29/2023   LDLDIRECT 132.2 03/10/2007   TRIG 116.0 11/29/2023   CHOLHDL 2 11/29/2023    Medications Reviewed Today     Reviewed by Merceda Lela SAUNDERS, RPH (Pharmacist) on 12/16/23 at 1014  Med List Status: <None>   Medication Order Taking? Sig Documenting Provider Last Dose Status Informant  ALPRAZolam  (XANAX ) 0.25 MG tablet [id number]  TAKE 1 TABLET(0.25 MG) BY MOUTH TWICE DAILY AS NEEDED FOR ANXIETY Webb, Padonda B, FNP  Active   cefpodoxime  (VANTIN ) 200 MG tablet [id number] Yes Take 1 tablet (200 mg total) by mouth 2 (two) times daily for 14 days. Joshua Debby CROME, MD  Active   Cholecalciferol (VITAMIN D3) 50 MCG (2000 UT) capsule [id number]  Take 2,000 Units by mouth daily. [provider]  Active Self  clopidogrel  (PLAVIX ) 75 MG tablet [id number]  Take 1 tablet (75 mg total) by mouth daily. Nishan, Peter C, MD  Active   isosorbide  mononitrate (IMDUR ) 30 MG 24 hr tablet [id number]  Take 0.5 tablets (15 mg total) by mouth daily. Nishan, Peter C, MD  Active   lisinopril  (ZESTRIL ) 10 MG tablet [id number] Yes Take 10 mg by mouth daily. [provider]  Active   metoprolol  succinate (TOPROL -XL) 25 MG 24 hr tablet [id number] Yes Take 1 tablet (25 mg total) by mouth daily. Nishan, Peter C, MD  Active   nitroGLYCERIN  (NITROSTAT ) 0.4 MG SL tablet  [id number]  Place 1 tablet (0.4 mg total) under the tongue every 5 (five) minutes as needed for chest pain. Nishan, Peter C, MD  Active   Polyethyl Glycol-Propyl Glycol (SYSTANE ULTRA) 0.4-0.3 % SOLN [id number]  Place 1 drop into both eyes at bedtime. [provider]  Active Self  psyllium (METAMUCIL) 58.6 % packet [id number]  Take 1 packet by mouth daily. [provider]  Active Self  rosuvastatin  (CRESTOR ) 40 MG tablet [id number]  Take 1  tablet (40 mg total) by mouth daily. Nishan, Peter C, MD  Active   tamsulosin  (FLOMAX ) 0.4 MG CAPS capsule [id number]  Take 0.8 mg by mouth every morning. [provider]  Active Self  zolpidem  (AMBIEN ) 10 MG tablet [id number]  TAKE 1 TABLET(10 MG) BY MOUTH AT BEDTIME AS NEEDED FOR SLEEP Joshua Debby CROME, MD  Active               Assessment/Plan:   Kidney cyst infection: Counseled pt to continue cefpodoxime  until finishes supply  Hypertension: - Currently controlled, BP goal <130/80 without hypotension - Recommend to continue current regimen. Advised to contact us  if having hypotension symptoms or low BP on current regimen - Med list updated to include lisinopril      Follow Up Plan: PRN  Darrelyn Drum, PharmD, BCPS, CPP Clinical Pharmacist Practitioner [facility] [phone]

## 2023-12-16 NOTE — Patient Instructions (Signed)
 It was a pleasure speaking with you today!  Continue metoprolol  and lisinopril  - contact us  if you begin experiencing low blood pressure again.  Continue cefpodoxime  antibiotic until you complete the course. Contact us  if infection symptoms return.  Feel free to call with any questions or concerns!  Darrelyn Drum, PharmD, BCPS, CPP Clinical Pharmacist Practitioner [facility] [phone]

## 2023-12-22 ENCOUNTER — Encounter: Payer: Self-pay | Admitting: Internal Medicine

## 2023-12-26 ENCOUNTER — Other Ambulatory Visit: Payer: Self-pay | Admitting: Internal Medicine

## 2023-12-26 DIAGNOSIS — N1832 Chronic kidney disease, stage 3b: Secondary | ICD-10-CM

## 2023-12-29 DIAGNOSIS — L821 Other seborrheic keratosis: Secondary | ICD-10-CM | POA: Diagnosis not present

## 2023-12-29 DIAGNOSIS — Z85820 Personal history of malignant melanoma of skin: Secondary | ICD-10-CM | POA: Diagnosis not present

## 2023-12-29 DIAGNOSIS — D692 Other nonthrombocytopenic purpura: Secondary | ICD-10-CM | POA: Diagnosis not present

## 2023-12-29 DIAGNOSIS — C44612 Basal cell carcinoma of skin of right upper limb, including shoulder: Secondary | ICD-10-CM | POA: Diagnosis not present

## 2023-12-29 DIAGNOSIS — L570 Actinic keratosis: Secondary | ICD-10-CM | POA: Diagnosis not present

## 2023-12-29 DIAGNOSIS — Z85828 Personal history of other malignant neoplasm of skin: Secondary | ICD-10-CM | POA: Diagnosis not present

## 2023-12-29 DIAGNOSIS — D045 Carcinoma in situ of skin of trunk: Secondary | ICD-10-CM | POA: Diagnosis not present

## 2023-12-29 DIAGNOSIS — L814 Other melanin hyperpigmentation: Secondary | ICD-10-CM | POA: Diagnosis not present

## 2023-12-29 DIAGNOSIS — D1801 Hemangioma of skin and subcutaneous tissue: Secondary | ICD-10-CM | POA: Diagnosis not present

## 2024-01-24 DIAGNOSIS — H903 Sensorineural hearing loss, bilateral: Secondary | ICD-10-CM | POA: Diagnosis not present

## 2024-01-27 DIAGNOSIS — R351 Nocturia: Secondary | ICD-10-CM | POA: Diagnosis not present

## 2024-01-27 DIAGNOSIS — R3915 Urgency of urination: Secondary | ICD-10-CM | POA: Diagnosis not present

## 2024-01-31 ENCOUNTER — Encounter: Payer: Self-pay | Admitting: Internal Medicine

## 2024-01-31 DIAGNOSIS — M545 Low back pain, unspecified: Secondary | ICD-10-CM | POA: Insufficient documentation

## 2024-01-31 NOTE — Progress Notes (Unsigned)
    Subjective:    Patient ID: [name] [name], male    DOB: Oct 11, 1932, 89 y.o.   MRN: [id number]      HPI [name] [name] is here for No chief complaint on file.        Medications and allergies reviewed with patient and updated if appropriate.  Current Outpatient Medications on File Prior to Visit  Medication Sig Dispense Refill   ALPRAZolam  (XANAX ) 0.25 MG tablet TAKE 1 TABLET(0.25 MG) BY MOUTH TWICE DAILY AS NEEDED FOR ANXIETY 60 tablet 1   Cholecalciferol (VITAMIN D3) 50 MCG (2000 UT) capsule Take 2,000 Units by mouth daily.     clopidogrel  (PLAVIX ) 75 MG tablet Take 1 tablet (75 mg total) by mouth daily. 90 tablet 3   isosorbide  mononitrate (IMDUR ) 30 MG 24 hr tablet Take 0.5 tablets (15 mg total) by mouth daily. 45 tablet 3   lisinopril  (ZESTRIL ) 10 MG tablet Take 10 mg by mouth daily.     metoprolol  succinate (TOPROL -XL) 25 MG 24 hr tablet Take 1 tablet (25 mg total) by mouth daily. 90 tablet 3   nitroGLYCERIN  (NITROSTAT ) 0.4 MG SL tablet Place 1 tablet (0.4 mg total) under the tongue every 5 (five) minutes as needed for chest pain. 25 tablet 3   Polyethyl Glycol-Propyl Glycol (SYSTANE ULTRA) 0.4-0.3 % SOLN Place 1 drop into both eyes at bedtime.     psyllium (METAMUCIL) 58.6 % packet Take 1 packet by mouth daily.     rosuvastatin  (CRESTOR ) 40 MG tablet Take 1 tablet (40 mg total) by mouth daily. 90 tablet 3   tamsulosin  (FLOMAX ) 0.4 MG CAPS capsule Take 0.8 mg by mouth every morning.     zolpidem  (AMBIEN ) 10 MG tablet TAKE 1 TABLET(10 MG) BY MOUTH AT BEDTIME AS NEEDED FOR SLEEP 90 tablet 0   No current facility-administered medications on file prior to visit.    Review of Systems     Objective:  There were no vitals filed for this visit. BP Readings from Last 3 Encounters:  12/07/23 (!) 98/53  11/30/23 126/64  11/29/23 118/60   Wt Readings from Last 3 Encounters:  12/07/23 139 lb (63 kg)  11/30/23 143 lb (64.9 kg)  11/29/23 141 lb (64 kg)   There is no height or  weight on file to calculate BMI.    Physical Exam         Assessment & Plan:    See Problem List for Assessment and Plan of chronic medical problems.

## 2024-01-31 NOTE — Patient Instructions (Incomplete)
     Blood work ordered - have it done prior to your next appointment.    Medications changes include :   increase bupropion  to 300 mg daily

## 2024-01-31 NOTE — Progress Notes (Unsigned)
 [name] [address] [place] [address] Phone: [phone] Subjective:   [name] [name], am serving as a scribe for Dr. Arthea Claudene.  I'm seeing this patient by the request  of:  Joshua Debby CROME, MD  CC:   YEP:Dlagzrupcz  11/30/2023 Patient given injections and tolerated the procedure well, discussed icing regimen of home exercises, discussed with patient if any chest pain, shortness of breath to seek medical attention immediately.  Increase activity slowly.  Continue to be active where possible.  Will follow-up again in 6 to 8 weeks     Updated 02/01/2024 [name] [name] is a 89 y.o. male coming in with complaint of LBP.  Has had difficulty with low back as well as mid back pain.  Continues to stay extremely active. Pain in R side of lower back for past few months. Friday and Saturday he drove a lot and on Sunday he was in extreme pain. Unable to get up. Put heat on area and took Tylenol  which was helpful.      Labs showed UA had hgb repeat urinalysis today shows the lack of hematuria but the patient does have increased bacteriuria and continues to have white blood cells  Past Medical History:  Diagnosis Date   Anxiety    CAD (coronary artery disease)    a. s/p stent to LAD 2008;  b. abnormal ETT 11/13 with VTach => LHC pLAD 30% ISR, mCFX 30%, dRCA 60-75% which had progressed from previous study in 2010 => FFR of RCA 0.92 => med Rx    Carotid stenosis    dopplers 1/14:  0-39% bilateral ICA stenosis   Colon polyps    Colon polyps    adenomatous   Diverticular disease of colon    Hemorrhoids    external and internal   History of BPH    HTN (hypertension)    Hx of echocardiogram    a. Echo 9/11:  EF 55-60%, normal motion, grade 2 diastolic dysfunction, mild MR, mild LAE, mild RAE, PASP 33.   Hyperlipidemia    IBS (irritable bowel syndrome)    Rhinitis    Stroke, lacunar (HCC) 08/24/2012   Old, 2011 head mri   Past Surgical History:   Procedure Laterality Date   APPENDECTOMY  05/17/1954   CORONARY STENT INTERVENTION N/A 06/15/2022   Procedure: CORONARY STENT INTERVENTION;  Surgeon: Dann Candyce RAMAN, MD;  Location: [facility] INVASIVE CV LAB;  Service: Cardiovascular;  Laterality: N/A;   CORONARY STENT PLACEMENT  05/17/2006    Successful PCI of the lesion in the proximal LAD using a Promus    HERNIA REPAIR Right 05/18/2003   LEFT HEART CATH AND CORONARY ANGIOGRAPHY N/A 06/15/2022   Procedure: LEFT HEART CATH AND CORONARY ANGIOGRAPHY;  Surgeon: Dann Candyce RAMAN, MD;  Location: [facility] INVASIVE CV LAB;  Service: Cardiovascular;  Laterality: N/A;   LEFT HEART CATH AND CORONARY ANGIOGRAPHY N/A 03/10/2023   Procedure: LEFT HEART CATH AND CORONARY ANGIOGRAPHY;  Surgeon: Wonda Sharper, MD;  Location: [facility] INVASIVE CV LAB;  Service: Cardiovascular;  Laterality: N/A;   NASAL SINUS SURGERY  05/17/1973   PERCUTANEOUS CORONARY STENT INTERVENTION (PCI-S) N/A 05/03/2012   Procedure: PERCUTANEOUS CORONARY STENT INTERVENTION (PCI-S);  Surgeon: Sharper Wonda, MD;  Location: [facility] CATH LAB;  Service: Cardiovascular;  Laterality: N/A;   Promus DES ok for 3T MRI  05/17/2006   Social History   Socioeconomic History   Marital status: Married    Spouse name: Not on file  Number of children: 3   Years of education: 16   Highest education level: Bachelor's degree (e.g., BA, AB, BS)  Occupational History   Occupation: businessman, Financial planner: RETIRED  Tobacco Use   Smoking status: Never    Passive exposure: Never   Smokeless tobacco: Never  Vaping Use   Vaping status: Never Used  Substance and Sexual Activity   Alcohol use: Yes    Alcohol/week: 1.0 standard drink of alcohol    Types: 1 Shots of liquor per week    Comment: 3 per week   Drug use: No   Sexual activity: Yes    Partners: Female  Other Topics Concern   Not on file  Social History Narrative   HSG, Engineer, 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 He has two daughters, 1 in [place] 1 in [place] (Dec '12) and a son who works with him. Several grandchildren.Reports that he spends a good deal of time at his home in [place] which he greatly enjoys and his home in the [place]. Enjoys driving [place] BMW [place] when [place].      ACP - Yes CPR, yes for short-term mechanical ventilation for reversible disease. Recommended [email or web address] for consideration.   Social Drivers of Corporate investment banker Strain: Low Risk  (11/28/2023)   Overall Financial Resource Strain (CARDIA)    Difficulty of Paying Living Expenses: Not hard at all  Food Insecurity: No Food Insecurity (11/28/2023)   Hunger Vital Sign    Worried About Running Out of Food in the Last Year: Never true    Ran Out of Food in the Last Year: Never true  Transportation Needs: No Transportation Needs (11/28/2023)   PRAPARE - Administrator, Civil Service (Medical): No    Lack of Transportation (Non-Medical): No  Physical Activity: Sufficiently Active (11/28/2023)   Exercise Vital Sign    Days of Exercise per Week: 4 days    Minutes of Exercise per Session: 60 min  Recent Concern: Physical Activity - Insufficiently Active (11/21/2023)   Exercise Vital Sign    Days of Exercise per Week: 4 days    Minutes of Exercise per Session: 30 min  Stress: No Stress Concern Present (11/28/2023)   [name] of Occupational Health - Occupational Stress Questionnaire    Feeling of Stress: Not at all  Social Connections: Socially Integrated (11/28/2023)   Social Connection and Isolation Panel    Frequency of Communication with Friends and Family: More than three times a week    Frequency of Social Gatherings with Friends and Family: Once a week    Attends Religious Services: More than 4 times per year    Active Member of [employer] or Organizations: Yes    Attends Tax inspector Meetings: 1 to 4 times per year    Marital Status: Married   Allergies  Allergen Reactions   Bactrim  [Sulfamethoxazole -Trimethoprim ]     Confusion, dizziness   Family History  Problem Relation Age of Onset   Rheum arthritis Mother    Coronary artery disease Father    Heart attack Father    Cancer Brother        ? TYPE   Colon cancer Neg Hx    Stomach cancer Neg Hx    Pancreatic cancer Neg Hx      Current Outpatient Medications (Cardiovascular):    isosorbide  mononitrate (IMDUR )  30 MG 24 hr tablet, Take 0.5 tablets (15 mg total) by mouth daily.   lisinopril  (ZESTRIL ) 10 MG tablet, Take 10 mg by mouth daily.   metoprolol  succinate (TOPROL -XL) 25 MG 24 hr tablet, Take 1 tablet (25 mg total) by mouth daily.   nitroGLYCERIN  (NITROSTAT ) 0.4 MG SL tablet, Place 1 tablet (0.4 mg total) under the tongue every 5 (five) minutes as needed for chest pain.   rosuvastatin  (CRESTOR ) 40 MG tablet, Take 1 tablet (40 mg total) by mouth daily.    Current Outpatient Medications (Hematological):    clopidogrel  (PLAVIX ) 75 MG tablet, Take 1 tablet (75 mg total) by mouth daily.  Current Outpatient Medications (Other):    ciprofloxacin  (CIPRO ) 500 MG tablet, Take 1 tablet (500 mg total) by mouth 2 (two) times daily.   ALPRAZolam  (XANAX ) 0.25 MG tablet, TAKE 1 TABLET(0.25 MG) BY MOUTH TWICE DAILY AS NEEDED FOR ANXIETY   cephALEXin  (KEFLEX ) 500 MG capsule, Take 1 capsule (500 mg total) by mouth 3 (three) times daily for 7 days.   Cholecalciferol (VITAMIN D3) 50 MCG (2000 UT) capsule, Take 2,000 Units by mouth daily.   Polyethyl Glycol-Propyl Glycol (SYSTANE ULTRA) 0.4-0.3 % SOLN, Place 1 drop into both eyes at bedtime.   psyllium (METAMUCIL) 58.6 % packet, Take 1 packet by mouth daily.   tamsulosin  (FLOMAX ) 0.4 MG CAPS capsule, Take 0.8 mg by mouth every morning.   zolpidem  (AMBIEN ) 10 MG tablet, TAKE 1 TABLET(10 MG) BY MOUTH AT BEDTIME AS NEEDED FOR SLEEP   Reviewed prior external  information including notes and imaging from  primary care provider As well as notes that were available from care everywhere and other healthcare systems.  Past medical history, social, surgical and family history all reviewed in electronic medical record.  No pertanent information unless stated regarding to the chief complaint.   Review of Systems:  No headache, visual changes, nausea, vomiting, diarrhea, constipation, dizziness, abdominal pain, skin rash, fevers, chills, night sweats, weight loss, swollen lymph nodes, body aches, joint swelling, chest pain, shortness of breath, mood changes. POSITIVE muscle aches  Objective  Blood pressure 112/60, pulse 76, height 5' 9 (1.753 m), weight 143 lb (64.9 kg), SpO2 96%.   General: No apparent distress alert and oriented x3 mood and affect normal, dressed appropriately.  HEENT: Pupils equal, extraocular movements intact  Respiratory: Patient's speak in full sentences and does not appear short of breath  Cardiovascular: No lower extremity edema, non tender, no erythema  Patient back does have some mild loss lordosis.  Mild right-sided CVA tenderness.  No midline tenderness.  Some pain still seems to be more in the right lower back and in the musculature. Negative straight leg test.  Some different discomfort noted with some range of motion.   Impression and Recommendations:     The above documentation has been reviewed and is accurate and complete Depaul Arizpe M Ara Mano, DO

## 2024-02-01 ENCOUNTER — Ambulatory Visit (INDEPENDENT_AMBULATORY_CARE_PROVIDER_SITE_OTHER): Admitting: Family Medicine

## 2024-02-01 ENCOUNTER — Ambulatory Visit (INDEPENDENT_AMBULATORY_CARE_PROVIDER_SITE_OTHER): Admitting: Internal Medicine

## 2024-02-01 ENCOUNTER — Ambulatory Visit: Payer: Self-pay | Admitting: Internal Medicine

## 2024-02-01 ENCOUNTER — Encounter: Payer: Self-pay | Admitting: Family Medicine

## 2024-02-01 VITALS — BP 112/60 | HR 76 | Ht 69.0 in | Wt 143.0 lb

## 2024-02-01 VITALS — BP 112/60 | HR 76 | Temp 98.3°F | Wt 143.2 lb

## 2024-02-01 DIAGNOSIS — M545 Low back pain, unspecified: Secondary | ICD-10-CM

## 2024-02-01 DIAGNOSIS — R109 Unspecified abdominal pain: Secondary | ICD-10-CM | POA: Diagnosis not present

## 2024-02-01 DIAGNOSIS — N1832 Chronic kidney disease, stage 3b: Secondary | ICD-10-CM

## 2024-02-01 DIAGNOSIS — H01002 Unspecified blepharitis right lower eyelid: Secondary | ICD-10-CM | POA: Diagnosis not present

## 2024-02-01 DIAGNOSIS — H5213 Myopia, bilateral: Secondary | ICD-10-CM | POA: Diagnosis not present

## 2024-02-01 DIAGNOSIS — H01005 Unspecified blepharitis left lower eyelid: Secondary | ICD-10-CM | POA: Diagnosis not present

## 2024-02-01 DIAGNOSIS — H5000 Unspecified esotropia: Secondary | ICD-10-CM | POA: Diagnosis not present

## 2024-02-01 DIAGNOSIS — H2512 Age-related nuclear cataract, left eye: Secondary | ICD-10-CM | POA: Diagnosis not present

## 2024-02-01 LAB — URINALYSIS, ROUTINE W REFLEX MICROSCOPIC
Bilirubin Urine: NEGATIVE
Hgb urine dipstick: NEGATIVE
Ketones, ur: NEGATIVE
Nitrite: NEGATIVE
Specific Gravity, Urine: 1.02 (ref 1.000–1.030)
Urine Glucose: NEGATIVE
Urobilinogen, UA: 0.2 (ref 0.0–1.0)
pH: 5.5 (ref 5.0–8.0)

## 2024-02-01 MED ORDER — KETOROLAC TROMETHAMINE 30 MG/ML IJ SOLN
30.0000 mg | Freq: Once | INTRAMUSCULAR | Status: AC
  Administered 2024-02-01: 30 mg via INTRAMUSCULAR

## 2024-02-01 MED ORDER — CIPROFLOXACIN HCL 500 MG PO TABS
500.0000 mg | ORAL_TABLET | Freq: Two times a day (BID) | ORAL | 0 refills | Status: DC
Start: 2024-02-01 — End: 2024-02-09

## 2024-02-01 MED ORDER — CEPHALEXIN 500 MG PO CAPS: 500.0000 mg | ORAL_CAPSULE | Freq: Three times a day (TID) | ORAL | 0 refills | Status: AC

## 2024-02-01 MED ORDER — METHYLPREDNISOLONE ACETATE 40 MG/ML IJ SUSP
40.0000 mg | Freq: Once | INTRAMUSCULAR | Status: AC
  Administered 2024-02-01: 40 mg via INTRAMUSCULAR

## 2024-02-01 NOTE — Addendum Note (Signed)
 Addended by: TONNIE SHU D on: 02/01/2024 03:41 PM   Modules accepted: Orders

## 2024-02-01 NOTE — Assessment & Plan Note (Signed)
 Low back noted.  Seems to be multifactorial.  There is a good possibility that there is possible infectious etiology with patient's urinalysis.  Hematuria resolved but patient still unfortunately has white blood cells.  Patient given Keflex , will add Cipro  in case there is some prostatitis.  Warned of potential side effects.  I do feel some of it is muscular and giving 30 mg of Toradol  and 40 mg of Depo-Medrol , discussed icing.  Follow-up again in 1 month worsening pain would recommend highly CT abdomen pelvis.

## 2024-02-01 NOTE — Patient Instructions (Addendum)
 Injections in backside See me in 6 weeks  If pain comes back after antibiotics We can order CT scan

## 2024-02-02 ENCOUNTER — Encounter: Payer: Self-pay | Admitting: Family Medicine

## 2024-02-02 LAB — URINE CULTURE: Result:: NO GROWTH

## 2024-02-06 ENCOUNTER — Ambulatory Visit
Admission: RE | Admit: 2024-02-06 | Discharge: 2024-02-06 | Disposition: A | Discharge status: Home / Self Care | Source: Ambulatory Visit | Attending: Internal Medicine | Admitting: Internal Medicine

## 2024-02-06 DIAGNOSIS — K5730 Diverticulosis of large intestine without perforation or abscess without bleeding: Secondary | ICD-10-CM | POA: Diagnosis not present

## 2024-02-06 DIAGNOSIS — R109 Unspecified abdominal pain: Secondary | ICD-10-CM

## 2024-02-09 ENCOUNTER — Ambulatory Visit: Admitting: Internal Medicine

## 2024-02-09 ENCOUNTER — Encounter: Payer: Self-pay | Admitting: Internal Medicine

## 2024-02-09 VITALS — BP 132/68 | HR 69 | Temp 98.2°F | Resp 16 | Ht 69.0 in | Wt 143.0 lb

## 2024-02-09 DIAGNOSIS — N410 Acute prostatitis: Secondary | ICD-10-CM

## 2024-02-09 DIAGNOSIS — I4729 Other ventricular tachycardia: Secondary | ICD-10-CM | POA: Diagnosis not present

## 2024-02-09 DIAGNOSIS — Z23 Encounter for immunization: Secondary | ICD-10-CM | POA: Diagnosis not present

## 2024-02-09 DIAGNOSIS — I10 Essential (primary) hypertension: Secondary | ICD-10-CM | POA: Diagnosis not present

## 2024-02-09 DIAGNOSIS — F5104 Psychophysiologic insomnia: Secondary | ICD-10-CM

## 2024-02-09 MED ORDER — COVID-19 MRNA VAC-TRIS(PFIZER) 30 MCG/0.3ML IM SUSY
0.3000 mL | PREFILLED_SYRINGE | Freq: Once | INTRAMUSCULAR | 0 refills | Status: AC
Start: 2024-02-09 — End: 2024-02-09

## 2024-02-09 MED ORDER — AMOXICILLIN-POT CLAVULANATE 875-125 MG PO TABS: 1.0000 | ORAL_TABLET | Freq: Two times a day (BID) | ORAL | 0 refills | Status: AC

## 2024-02-09 MED ORDER — ZOLPIDEM TARTRATE 10 MG PO TABS: 10.0000 mg | ORAL_TABLET | Freq: Every evening | ORAL | 0 refills | Status: DC | PRN

## 2024-02-09 NOTE — Patient Instructions (Signed)

## 2024-02-09 NOTE — Progress Notes (Signed)
 Subjective:  Patient ID: [name] [name], male    DOB: 1933-04-26  Age: 89 y.o. MRN: [id number]  CC: Back Pain (Lower back pain for about 2 months and its getting worst and worst. (Lower right). Intermittent pain. Tylenol  has been helping. He seen Dr. Geofm about this last week. )   HPI [name] [name] presents for f/up ----  Discussed the use of AI scribe software for clinical note transcription with the patient, who gave verbal consent to proceed.  History of Present Illness [name] [name] is an 89 year old male who presents with persistent lower right back pain and urinary difficulties.  Approximately two months ago, he began experiencing intermittent pain in his lower right back, specifically in the right flank area. The pain radiated to the right groin and was particularly noticeable when sitting down to urinate. Initially, he found relief with Tylenol  and a heating pad, suspecting a muscular issue. However, the pain worsened, leading to an episode where he had to lie down due to its severity.  He also experienced difficulty urinating, which led to a urology test confirming an infection. He was prescribed an antibiotic, and concurrently, he was diagnosed with a kidney infection and prescribed Cipro , which he took for five days, twice daily. This treatment alleviated his symptoms, but occasional pain persists.  A recent scan showed an enlarged prostate pressing against the bladder, as reported to him by his doctor. He notes that the pain is mild, rated as a 'one' on a scale of zero to ten, but it seems to be progressing. No fever, chills, nausea, vomiting, or blood in the urine, although some blood in the urine was previously noted and deemed insignificant. He reports no issues with blood pressure and maintains his daily walking routine without dizziness or cognitive difficulties.  He has not experienced any adverse effects from Cipro  and has no known allergies to antibiotics,  except for a previous unspecified reaction to one antibiotic. No fever, chills, nausea, vomiting, diarrhea, dizziness, or cognitive issues. He experiences pain when sitting to urinate but not when standing.     Outpatient Medications Prior to Visit  Medication Sig Dispense Refill   ALPRAZolam  (XANAX ) 0.25 MG tablet TAKE 1 TABLET(0.25 MG) BY MOUTH TWICE DAILY AS NEEDED FOR ANXIETY 60 tablet 1   Cholecalciferol (VITAMIN D3) 50 MCG (2000 UT) capsule Take 2,000 Units by mouth daily.     ciprofloxacin  (CIPRO ) 500 MG tablet Take 1 tablet (500 mg total) by mouth 2 (two) times daily. 14 tablet 0   clopidogrel  (PLAVIX ) 75 MG tablet Take 1 tablet (75 mg total) by mouth daily. 90 tablet 3   isosorbide  mononitrate (IMDUR ) 30 MG 24 hr tablet Take 0.5 tablets (15 mg total) by mouth daily. 45 tablet 3   lisinopril  (ZESTRIL ) 10 MG tablet Take 10 mg by mouth daily.     metoprolol  succinate (TOPROL -XL) 25 MG 24 hr tablet Take 1 tablet (25 mg total) by mouth daily. 90 tablet 3   nitroGLYCERIN  (NITROSTAT ) 0.4 MG SL tablet Place 1 tablet (0.4 mg total) under the tongue every 5 (five) minutes as needed for chest pain. 25 tablet 3   Polyethyl Glycol-Propyl Glycol (SYSTANE ULTRA) 0.4-0.3 % SOLN Place 1 drop into both eyes at bedtime.     psyllium (METAMUCIL) 58.6 % packet Take 1 packet by mouth daily.     rosuvastatin  (CRESTOR ) 40 MG tablet Take 1 tablet (40 mg total) by mouth daily. 90 tablet 3   tamsulosin  (  FLOMAX ) 0.4 MG CAPS capsule Take 0.8 mg by mouth every morning.     zolpidem  (AMBIEN ) 10 MG tablet TAKE 1 TABLET(10 MG) BY MOUTH AT BEDTIME AS NEEDED FOR SLEEP 90 tablet 0   No facility-administered medications prior to visit.    ROS Review of Systems  Constitutional:  Negative for appetite change, chills, diaphoresis, fatigue and fever.  HENT: Negative.    Eyes: Negative.   Respiratory: Negative.  Negative for cough, chest tightness and wheezing.   Cardiovascular:  Negative for chest pain, palpitations  and leg swelling.  Gastrointestinal:  Negative for abdominal pain, constipation, diarrhea, nausea and vomiting.  Genitourinary:  Positive for difficulty urinating and flank pain. Negative for dysuria, hematuria, scrotal swelling and testicular pain.  Musculoskeletal:  Positive for arthralgias and back pain. Negative for joint swelling and myalgias.  Skin: Negative.   Neurological: Negative.  Negative for dizziness and light-headedness.  Hematological:  Negative for adenopathy. Does not bruise/bleed easily.  Psychiatric/Behavioral:  Positive for sleep disturbance. Negative for confusion, decreased concentration and dysphoric mood.     Objective:  BP 132/68 (BP Location: Left Arm, Patient Position: Sitting, Cuff Size: Normal)   Pulse 69   Temp 98.2 F (36.8 C) (Oral)   Resp 16   Ht 5' 9 (1.753 m)   Wt 143 lb (64.9 kg)   SpO2 97%   BMI 21.12 kg/m   BP Readings from Last 3 Encounters:  02/09/24 132/68  02/01/24 112/60  02/01/24 112/60    Wt Readings from Last 3 Encounters:  02/09/24 143 lb (64.9 kg)  02/01/24 143 lb (64.9 kg)  02/01/24 143 lb 3.2 oz (65 kg)    Physical Exam Vitals reviewed.  Constitutional:      Appearance: Normal appearance.  HENT:     Nose: Nose normal.     Mouth/Throat:     Mouth: Mucous membranes are moist.  Eyes:     General: No scleral icterus.    Conjunctiva/sclera: Conjunctivae normal.  Cardiovascular:     Rate and Rhythm: Normal rate and regular rhythm.     Heart sounds: No murmur heard.    No friction rub. No gallop.  Pulmonary:     Effort: Pulmonary effort is normal.     Breath sounds: No stridor. No wheezing, rhonchi or rales.  Abdominal:     General: Abdomen is flat.     Palpations: There is no mass.     Tenderness: There is no abdominal tenderness. There is no guarding.     Hernia: No hernia is present.  Musculoskeletal:        General: Normal range of motion.     Cervical back: Neck supple.     Right lower leg: No edema.      Left lower leg: No edema.  Lymphadenopathy:     Cervical: No cervical adenopathy.  Skin:    General: Skin is warm.  Neurological:     General: No focal deficit present.     Mental Status: He is alert.  Psychiatric:        Mood and Affect: Mood normal.        Behavior: Behavior normal.     Lab Results  Component Value Date   WBC 5.5 11/29/2023   HGB 13.8 11/29/2023   HCT 41.0 11/29/2023   PLT 201.0 11/29/2023   GLUCOSE 95 11/29/2023   CHOL 131 11/29/2023   TRIG 116.0 11/29/2023   HDL 57.10 11/29/2023   LDLDIRECT 132.2 03/10/2007   LDLCALC 51 11/29/2023  ALT 12 11/29/2023   AST 15 11/29/2023   NA 140 11/29/2023   K 4.3 11/29/2023   CL 106 11/29/2023   CREATININE 1.07 11/29/2023   BUN 24 (H) 11/29/2023   CO2 28 11/29/2023   TSH 2.77 11/29/2023   PSA 1.20 11/29/2023   INR 1.0 04/27/2012    CT RENAL STONE STUDY Result Date: 02/06/2024 CLINICAL DATA:  Right groin and flank pain for the past 2 months. Clinical concern for urolithiasis. EXAM: CT ABDOMEN AND PELVIS WITHOUT CONTRAST TECHNIQUE: Multidetector CT imaging of the abdomen and pelvis was performed following the standard protocol without IV contrast. RADIATION DOSE REDUCTION: This exam was performed according to the departmental dose-optimization program which includes automated exposure control, adjustment of the mA and/or kV according to patient size and/or use of iterative reconstruction technique. COMPARISON:  12/30/2016 FINDINGS: Lower chest: Interval coronary artery stents and/or dense atheromatous calcifications. Borderline enlarged heart. Clear lung bases. Hepatobiliary: Stable small medial segment left lobe liver cyst. This does not need imaging follow-up. Normal-appearing gallbladder. Pancreas: Unremarkable. No pancreatic ductal dilatation or surrounding inflammatory changes. Spleen: Normal in size without focal abnormality. Adrenals/Urinary Tract: Mild increase in size of a simple appearing right renal cyst  measuring 3 Hounsfield units in density, not requiring imaging follow-up. Interval additional multiple simple appearing cysts not requiring imaging follow-up. Unremarkable adrenal glands and ureters. Enlarged prostate gland protruding into the base of the urinary bladder. Mild diffuse bladder wall thickening. No urinary tract calculi. Stomach/Bowel: Small to moderate-sized hiatal hernia. Mild sigmoid colon diverticulosis without evidence of diverticulitis. Surgically absent appendix. Unremarkable small bowel. Vascular/Lymphatic: Atheromatous arterial calcifications without aneurysm. No enlarged lymph nodes. Reproductive: Moderately to markedly enlarged prostate gland protruding into the base of the urinary bladder. Other: No abdominal wall hernia or abnormality. No abdominopelvic ascites. Musculoskeletal: Lumbar and lower thoracic spine degenerative changes with mild-to-moderate scoliosis. Mild right and minimal left hip degenerative changes. IMPRESSION: 1. No acute abnormality. 2. No urinary tract calculi. 3. Moderately to markedly enlarged prostate gland protruding into the base of the urinary bladder. 4. Mild diffuse bladder wall thickening, most likely due to chronic bladder outlet obstruction by the enlarged prostate gland. 5. Small to moderate-sized hiatal hernia. 6. Mild sigmoid colon diverticulosis. Electronically Signed   By: Elspeth Bathe M.D.   On: 02/06/2024 14:33    Assessment & Plan:  Need for immunization against influenza -     Flu vaccine HIGH DOSE PF(Fluzone Trivalent)  Acute prostatitis with hematuria -     Amoxicillin -Pot Clavulanate; Take 1 tablet by mouth 2 (two) times daily for 21 days.  Dispense: 42 tablet; Refill: 0  Psychophysiological insomnia -     Zolpidem  Tartrate; Take 1 tablet (10 mg total) by mouth at bedtime as needed for sleep.  Dispense: 90 tablet; Refill: 0  NSVT (nonsustained ventricular tachycardia) (HCC)- He has good R/R control.  Essential hypertension- BP is  well controlled.  Other orders -     COVID-19 mRNA Vac-TriS(Pfizer); Inject 0.3 mLs into the muscle once for 1 dose.  Dispense: 0.3 mL; Refill: 0     Follow-up: Return if symptoms worsen or fail to improve.  Debby Molt, MD

## 2024-02-13 ENCOUNTER — Ambulatory Visit: Admitting: Internal Medicine

## 2024-02-17 ENCOUNTER — Encounter: Payer: Self-pay | Admitting: Internal Medicine

## 2024-02-19 ENCOUNTER — Other Ambulatory Visit: Payer: Self-pay | Admitting: Internal Medicine

## 2024-02-19 DIAGNOSIS — H903 Sensorineural hearing loss, bilateral: Secondary | ICD-10-CM

## 2024-02-27 ENCOUNTER — Ambulatory Visit: Admitting: Internal Medicine

## 2024-03-01 ENCOUNTER — Ambulatory Visit: Admitting: Family Medicine

## 2024-03-01 VITALS — BP 108/60 | HR 65 | Temp 97.8°F | Ht 69.0 in | Wt 143.0 lb

## 2024-03-01 DIAGNOSIS — M545 Low back pain, unspecified: Secondary | ICD-10-CM | POA: Diagnosis not present

## 2024-03-01 DIAGNOSIS — R3911 Hesitancy of micturition: Secondary | ICD-10-CM

## 2024-03-01 DIAGNOSIS — F411 Generalized anxiety disorder: Secondary | ICD-10-CM | POA: Diagnosis not present

## 2024-03-01 DIAGNOSIS — G8929 Other chronic pain: Secondary | ICD-10-CM

## 2024-03-01 DIAGNOSIS — N401 Enlarged prostate with lower urinary tract symptoms: Secondary | ICD-10-CM | POA: Diagnosis not present

## 2024-03-01 LAB — POC URINALSYSI DIPSTICK (AUTOMATED)
Bilirubin, UA: NEGATIVE
Blood, UA: NEGATIVE
Glucose, UA: NEGATIVE
Ketones, UA: NEGATIVE
Leukocytes, UA: NEGATIVE
Nitrite, UA: NEGATIVE
Protein, UA: NEGATIVE
Spec Grav, UA: 1.015 (ref 1.010–1.025)
Urobilinogen, UA: 0.2 U/dL
pH, UA: 6 (ref 5.0–8.0)

## 2024-03-01 MED ORDER — ALPRAZOLAM 0.25 MG PO TABS: 0.2500 mg | ORAL_TABLET | Freq: Every day | ORAL | 0 refills | Status: DC | PRN

## 2024-03-01 MED ORDER — LIDOCAINE 5 % EX PTCH: 1.0000 | MEDICATED_PATCH | CUTANEOUS | 0 refills | Status: AC

## 2024-03-01 NOTE — Patient Instructions (Addendum)
 Try the lidocaine  patches and see if these are helpful.  I ordered an MRI of your lumbar spine and you will receive a call to schedule this.  Please let us  know if you have any new or worsening symptoms.

## 2024-03-01 NOTE — Progress Notes (Signed)
 Subjective:     Patient ID: [name] [name], male    DOB: 13-Apr-1933, 89 y.o.   MRN: [id number]  Chief Complaint  Patient presents with   Back Pain    Right lower back pain, comes and goes. Normally takes ibuprofen and that helps but now getting worse. diagnosed with CKD. Dr joshua prescribed augmentin  and still taking it    HPI  Discussed the use of AI scribe software for clinical note transcription with the patient, who gave verbal consent to proceed.  History of Present Illness [name] [name] is an 89 year old male who presents with persistent right low back pain.  Right low back pain - Persistent for approximately two months - Localized to the right low back, non-radiating - Pain severity rated as 'barely a two' on a 0-10 scale - Aggravated by sitting or standing - Not aggravated by lying down - Partial relief with heat and topical treatments - Injections for pain management at [facility] provided only temporary relief  Lower urinary tract symptoms - Difficulty with complete bladder emptying persists - Currently on a 21-day course of Augmentin  for acute prostatitis, with four pills remaining after seeing PCP for same pain  - No significant improvement in symptoms despite 15 years of tamsulosin  use; dose increased to two pills daily two years ago without significant benefit - CT stone study on February 06, 2024, revealed enlarged prostate gland protruding into the base of the urinary bladder and mild diffuse bladder wall thickening - Recent visit with [facility] for same  - No fever, chills, nausea, or vomiting     Health Maintenance Due  Topic Date Due   DTaP/Tdap/Td (2 - Td or Tdap) 12/14/2021    Past Medical History:  Diagnosis Date   Anxiety    CAD (coronary artery disease)    a. s/p stent to LAD 2008;  b. abnormal ETT 11/13 with VTach => LHC pLAD 30% ISR, mCFX 30%, dRCA 60-75% which had progressed from previous study in 2010 => FFR of RCA  0.92 => med Rx    Carotid stenosis    dopplers 1/14:  0-39% bilateral ICA stenosis   Colon polyps    Colon polyps    adenomatous   Diverticular disease of colon    Hemorrhoids    external and internal   History of BPH    HTN (hypertension)    Hx of echocardiogram    a. Echo 9/11:  EF 55-60%, normal motion, grade 2 diastolic dysfunction, mild MR, mild LAE, mild RAE, PASP 33.   Hyperlipidemia    IBS (irritable bowel syndrome)    Rhinitis    Stroke, lacunar (HCC) 08/24/2012   Old, 2011 head mri    Past Surgical History:  Procedure Laterality Date   APPENDECTOMY  05/17/1954   CORONARY STENT INTERVENTION N/A 06/15/2022   Procedure: CORONARY STENT INTERVENTION;  Surgeon: Dann Candyce RAMAN, MD;  Location: [facility] INVASIVE CV LAB;  Service: Cardiovascular;  Laterality: N/A;   CORONARY STENT PLACEMENT  05/17/2006    Successful PCI of the lesion in the proximal LAD using a Promus    HERNIA REPAIR Right 05/18/2003   LEFT HEART CATH AND CORONARY ANGIOGRAPHY N/A 06/15/2022   Procedure: LEFT HEART CATH AND CORONARY ANGIOGRAPHY;  Surgeon: Dann Candyce RAMAN, MD;  Location: [facility] INVASIVE CV LAB;  Service: Cardiovascular;  Laterality: N/A;   LEFT HEART CATH AND CORONARY ANGIOGRAPHY N/A 03/10/2023   Procedure: LEFT HEART CATH AND CORONARY ANGIOGRAPHY;  Surgeon:  Wonda Sharper, MD;  Location[facility] INVASIVE CV LAB;  Service: Cardiovascular;  Laterality: N/A;   NASAL SINUS SURGERY  05/17/1973   PERCUTANEOUS CORONARY STENT INTERVENTION (PCI-S) N/A 05/03/2012   Procedure: PERCUTANEOUS CORONARY STENT INTERVENTION (PCI-S);  Surgeon: Sharper Wonda, MD;  Location: [facility] CATH LAB;  Service: Cardiovascular;  Laterality: N/A;   Promus DES ok for 3T MRI  05/17/2006    Family History  Problem Relation Age of Onset   Rheum arthritis Mother    Coronary artery disease Father    Heart attack Father    Cancer Brother        ? TYPE   Colon cancer Neg Hx    Stomach cancer Neg Hx    Pancreatic cancer Neg Hx      Social History   Socioeconomic History   Marital status: Married    Spouse name: Not on file   Number of children: 3   Years of education: 16   Highest education level: Bachelor's degree (e.g., BA, AB, BS)  Occupational History   Occupation: businessman, Financial planner: RETIRED  Tobacco Use   Smoking status: Never    Passive exposure: Never   Smokeless tobacco: Never  Vaping Use   Vaping status: Never Used  Substance and Sexual Activity   Alcohol use: Yes    Alcohol/week: 1.0 standard drink of alcohol    Types: 1 Shots of liquor per week    Comment: 3 per week   Drug use: No   Sexual activity: Yes    Partners: Female  Other Topics Concern   Not on file  Social History Narrative   HSG, Engineer, 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 He has two daughters, 1 in [place] 1 in [place] (Dec '12) and a son who works with him. Several grandchildren.Reports that he spends a good deal of time at his home in [facility] which he greatly enjoys and his home in the [place]. Enjoys driving his BMW [place] when [place].      ACP - Yes CPR, yes for short-term mechanical ventilation for reversible disease. Recommended [email or web address] for consideration.   Social Drivers of Corporate investment banker Strain: Low Risk  (03/01/2024)   Overall Financial Resource Strain (CARDIA)    Difficulty of Paying Living Expenses: Not hard at all  Food Insecurity: No Food Insecurity (03/01/2024)   Hunger Vital Sign    Worried About Running Out of Food in the Last Year: Never true    Ran Out of Food in the Last Year: Never true  Transportation Needs: No Transportation Needs (03/01/2024)   PRAPARE - Administrator, Civil Service (Medical): No    Lack of Transportation (Non-Medical): No  Physical Activity: Insufficiently Active (03/01/2024)   Exercise Vital Sign     Days of Exercise per Week: 3 days    Minutes of Exercise per Session: 40 min  Stress: No Stress Concern Present (03/01/2024)   [name] of Occupational Health - Occupational Stress Questionnaire    Feeling of Stress: Not at all  Social Connections: Unknown (03/01/2024)   Social Connection and Isolation Panel    Frequency of Communication with Friends and Family: Three times a week    Frequency of Social Gatherings with Friends and Family: Three times a week    Attends Religious Services: More than 4 times per year    Active Member of  Clubs or Organizations: Not on file    Attends Club or Organization Meetings: Not on file    Marital Status: Married  Intimate Partner Violence: Not At Risk (11/21/2023)   Humiliation, Afraid, Rape, and Kick questionnaire    Fear of Current or Ex-Partner: No    Emotionally Abused: No    Physically Abused: No    Sexually Abused: No    Outpatient Medications Prior to Visit  Medication Sig Dispense Refill   amoxicillin -clavulanate (AUGMENTIN ) 875-125 MG tablet Take 1 tablet by mouth 2 (two) times daily for 21 days. 42 tablet 0   Cholecalciferol (VITAMIN D3) 50 MCG (2000 UT) capsule Take 2,000 Units by mouth daily.     clopidogrel  (PLAVIX ) 75 MG tablet Take 1 tablet (75 mg total) by mouth daily. 90 tablet 3   isosorbide  mononitrate (IMDUR ) 30 MG 24 hr tablet Take 0.5 tablets (15 mg total) by mouth daily. 45 tablet 3   lisinopril  (ZESTRIL ) 10 MG tablet Take 10 mg by mouth daily.     metoprolol  succinate (TOPROL -XL) 25 MG 24 hr tablet Take 1 tablet (25 mg total) by mouth daily. 90 tablet 3   nitroGLYCERIN  (NITROSTAT ) 0.4 MG SL tablet Place 1 tablet (0.4 mg total) under the tongue every 5 (five) minutes as needed for chest pain. 25 tablet 3   Polyethyl Glycol-Propyl Glycol (SYSTANE ULTRA) 0.4-0.3 % SOLN Place 1 drop into both eyes at bedtime.     psyllium (METAMUCIL) 58.6 % packet Take 1 packet by mouth daily.     rosuvastatin  (CRESTOR ) 40 MG tablet  Take 1 tablet (40 mg total) by mouth daily. 90 tablet 3   tamsulosin  (FLOMAX ) 0.4 MG CAPS capsule Take 0.8 mg by mouth every morning.     zolpidem  (AMBIEN ) 10 MG tablet Take 1 tablet (10 mg total) by mouth at bedtime as needed for sleep. 90 tablet 0   ALPRAZolam  (XANAX ) 0.25 MG tablet TAKE 1 TABLET(0.25 MG) BY MOUTH TWICE DAILY AS NEEDED FOR ANXIETY 60 tablet 1   No facility-administered medications prior to visit.    Allergies  Allergen Reactions   Bactrim  [Sulfamethoxazole -Trimethoprim ]     Confusion, dizziness    Review of Systems  Constitutional:  Negative for chills and fever.  Respiratory:  Negative for shortness of breath.   Cardiovascular:  Negative for chest pain, palpitations and leg swelling.  Gastrointestinal:  Negative for abdominal pain, constipation, diarrhea, nausea and vomiting.  Genitourinary:  Negative for dysuria, frequency and urgency.       Hesitancy   Musculoskeletal:  Positive for back pain.  Skin:  Negative for rash.  Neurological:  Negative for dizziness, focal weakness and headaches.       Objective:    Physical Exam Constitutional:      General: He is not in acute distress.    Appearance: He is not ill-appearing.  Eyes:     Extraocular Movements: Extraocular movements intact.     Conjunctiva/sclera: Conjunctivae normal.  Cardiovascular:     Rate and Rhythm: Normal rate.  Pulmonary:     Effort: Pulmonary effort is normal.  Musculoskeletal:     Cervical back: Normal, normal range of motion and neck supple.     Thoracic back: Normal.     Lumbar back: No tenderness. Normal range of motion.       Back:     Right lower leg: No edema.     Left lower leg: No edema.     Comments: Focal pain to right lumbar paraspinal muscle  without radiculopathy   Skin:    General: Skin is warm and dry.  Neurological:     General: No focal deficit present.     Mental Status: He is alert and oriented to person, place, and time.     Motor: No weakness.      Coordination: Coordination normal.     Gait: Gait normal.  Psychiatric:        Mood and Affect: Mood normal.        Behavior: Behavior normal.        Thought Content: Thought content normal.      BP 108/60   Pulse 65   Temp 97.8 F (36.6 C) (Temporal)   Ht 5' 9 (1.753 m)   Wt 143 lb (64.9 kg)   SpO2 98%   BMI 21.12 kg/m  Wt Readings from Last 3 Encounters:  03/01/24 143 lb (64.9 kg)  02/09/24 143 lb (64.9 kg)  02/01/24 143 lb (64.9 kg)       Assessment & Plan:   Problem List Items Addressed This Visit     GAD (generalized anxiety disorder)   Relevant Medications   ALPRAZolam  (XANAX ) 0.25 MG tablet   Other Visit Diagnoses       Chronic right-sided low back pain without sciatica    -  Primary   Relevant Medications   lidocaine  (LIDODERM ) 5 %   Other Relevant Orders   POCT Urinalysis Dipstick (Automated) (Completed)   MR LUMBAR SPINE WO CONTRAST     Benign prostatic hyperplasia with urinary hesitancy       Relevant Orders   POCT Urinalysis Dipstick (Automated) (Completed)       Assessment and Plan Assessment & Plan Right low back pain Persistent right low back pain for two months, mild (2/10), worsens with sitting or standing, relieved by lying down, responsive to heat and topical treatments, suggesting muscular origin. Differential includes muscular pain or arthritis. Previous steroid injections provided temporary relief. No infection or systemic symptoms. - Order MRI of lumbar spine to evaluate for underlying causes, including muscular or arthritic changes. - Prescribe lidocaine  patches for topical pain relief. - no improvement with antibiotics   Benign prostatic hyperplasia with bladder outlet obstruction Enlarged prostate causing bladder outlet obstruction, symptoms include difficulty emptying bladder, exacerbated by sitting. Tamsulosin  used for 15 years, recently increased to two pills per day. May follow up with urology   Acute prostatitis, recently  treated Recently treated with 21-day course of Augmentin  for acute prostatitis diagnosed on February 09, 2024. Urinalysis normal, indicating resolution. No symptoms of infection such as fever, chills, or malaise. Four pills remaining to complete the course. - Urinalysis dipstick is negative     I have changed [name] [name]'s ALPRAZolam . I am also having him start on lidocaine . Additionally, I am having him maintain his tamsulosin , psyllium, Systane Ultra, Vitamin D3, rosuvastatin , clopidogrel , nitroGLYCERIN , isosorbide  mononitrate, metoprolol  succinate, lisinopril , amoxicillin -clavulanate, and zolpidem .  Meds ordered this encounter  Medications   lidocaine  (LIDODERM ) 5 %    Sig: Place 1 patch onto the skin daily. Remove & Discard patch within 12 hours or as directed by MD    Dispense:  30 patch    Refill:  0    Supervising Provider:   ROLLENE NORRIS A [[id number]]   ALPRAZolam  (XANAX ) 0.25 MG tablet    Sig: Take 1 tablet (0.25 mg total) by mouth daily as needed for anxiety (Take 1-2 tablets 30 minutes prior to MRI).    Dispense:  20  tablet    Refill:  0    Supervising Provider:   ROLLENE NORRIS A [[id number]]

## 2024-03-07 ENCOUNTER — Other Ambulatory Visit

## 2024-03-07 DIAGNOSIS — Z006 Encounter for examination for normal comparison and control in clinical research program: Secondary | ICD-10-CM

## 2024-03-08 NOTE — Progress Notes (Signed)
 [name] [address] [place] [address] Phone: [phone] Subjective:   [name] [name] am a scribe for Dr. Claudene.  I'm seeing this patient by the request  of:  Joshua Debby CROME, MD  CC: Low back pain follow-up  YEP:Dlagzrupcz  02/01/2024 Low back noted.  Seems to be multifactorial.  There is a good possibility that there is possible infectious etiology with patient's urinalysis.  Hematuria resolved but patient still unfortunately has white blood cells.  Patient given Keflex , will add Cipro  in case there is some prostatitis.  Warned of potential side effects.  I do feel some of it is muscular and giving 30 mg of Toradol  and 40 mg of Depo-Medrol , discussed icing.  Follow-up again in 1 month worsening pain would recommend highly CT abdomen pelvis.     Updated 03/14/2024 [name] [name] is a 89 y.o. male coming in with complaint of back pain. Patient states the back is doing fine today. Pain is non-existent. Been having constipation since he stopped taking the sulfur drug for 42 days. Need a referral to a urologist.  CT scan did show that there was some benign prostate hyperplasia. Has had some constipation recently but otherwise nothing severe.  Feels like his back is doing much better.      Past Medical History:  Diagnosis Date   Anxiety    CAD (coronary artery disease)    a. s/p stent to LAD 2008;  b. abnormal ETT 11/13 with VTach => LHC pLAD 30% ISR, mCFX 30%, dRCA 60-75% which had progressed from previous study in 2010 => FFR of RCA 0.92 => med Rx    Carotid stenosis    dopplers 1/14:  0-39% bilateral ICA stenosis   Colon polyps    Colon polyps    adenomatous   Diverticular disease of colon    Hemorrhoids    external and internal   History of BPH    HTN (hypertension)    Hx of echocardiogram    a. Echo 9/11:  EF 55-60%, normal motion, grade 2 diastolic dysfunction, mild MR, mild LAE, mild RAE, PASP 33.   Hyperlipidemia    IBS  (irritable bowel syndrome)    Rhinitis    Stroke, lacunar (HCC) 08/24/2012   Old, 2011 head mri   Past Surgical History:  Procedure Laterality Date   APPENDECTOMY  05/17/1954   CORONARY STENT INTERVENTION N/A 06/15/2022   Procedure: CORONARY STENT INTERVENTION;  Surgeon: Dann Candyce RAMAN, MD;  Location: [facility] INVASIVE CV LAB;  Service: Cardiovascular;  Laterality: N/A;   CORONARY STENT PLACEMENT  05/17/2006    Successful PCI of the lesion in the proximal LAD using a Promus    HERNIA REPAIR Right 05/18/2003   LEFT HEART CATH AND CORONARY ANGIOGRAPHY N/A 06/15/2022   Procedure: LEFT HEART CATH AND CORONARY ANGIOGRAPHY;  Surgeon: Dann Candyce RAMAN, MD;  Location: [facility] INVASIVE CV LAB;  Service: Cardiovascular;  Laterality: N/A;   LEFT HEART CATH AND CORONARY ANGIOGRAPHY N/A 03/10/2023   Procedure: LEFT HEART CATH AND CORONARY ANGIOGRAPHY;  Surgeon: Wonda Sharper, MD;  Location: [facility] INVASIVE CV LAB;  Service: Cardiovascular;  Laterality: N/A;   NASAL SINUS SURGERY  05/17/1973   PERCUTANEOUS CORONARY STENT INTERVENTION (PCI-S) N/A 05/03/2012   Procedure: PERCUTANEOUS CORONARY STENT INTERVENTION (PCI-S);  Surgeon: Sharper Wonda, MD;  Location: [facility] CATH LAB;  Service: Cardiovascular;  Laterality: N/A;   Promus DES ok for 3T MRI  05/17/2006   Social History   Socioeconomic History  Marital status: Married    Spouse name: Not on file   Number of children: 3   Years of education: 16   Highest education level: Bachelor's degree (e.g., BA, AB, BS)  Occupational History   Occupation: businessman, Financial Planner: RETIRED  Tobacco Use   Smoking status: Never    Passive exposure: Never   Smokeless tobacco: Never  Vaping Use   Vaping status: Never Used  Substance and Sexual Activity   Alcohol use: Yes    Alcohol/week: 1.0 standard drink of alcohol    Types: 1 Shots of liquor per week    Comment: 3 per week   Drug use: No   Sexual activity: Yes    Partners: Female   Other Topics Concern   Not on file  Social History Narrative   HSG, Engineer, 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 He has two daughters, 1 in [place] 1 in [place] (Dec '12) and a son who works with him. Several grandchildren.Reports that he spends a good deal of time at his home in [facility] [place] [facility] which he greatly enjoys and his home in the [place]. Enjoys driving his BMW convertible when in the mountains.      ACP - Yes CPR, yes for short-term mechanical ventilation for reversible disease. Recommended [email or web address] for consideration.   Social Drivers of Corporate Investment Banker Strain: Low Risk  (03/01/2024)   Overall Financial Resource Strain (CARDIA)    Difficulty of Paying Living Expenses: Not hard at all  Food Insecurity: No Food Insecurity (03/01/2024)   Hunger Vital Sign    Worried About Running Out of Food in the Last Year: Never true    Ran Out of Food in the Last Year: Never true  Transportation Needs: No Transportation Needs (03/01/2024)   PRAPARE - Administrator, Civil Service (Medical): No    Lack of Transportation (Non-Medical): No  Physical Activity: Insufficiently Active (03/01/2024)   Exercise Vital Sign    Days of Exercise per Week: 3 days    Minutes of Exercise per Session: 40 min  Stress: No Stress Concern Present (03/01/2024)   [name] of Occupational Health - Occupational Stress Questionnaire    Feeling of Stress: Not at all  Social Connections: Unknown (03/01/2024)   Social Connection and Isolation Panel    Frequency of Communication with Friends and Family: Three times a week    Frequency of Social Gatherings with Friends and Family: Three times a week    Attends Religious Services: More than 4 times per year    Active Member of Clubs or Organizations: Not on file    Attends Banker Meetings: Not on file    Marital Status:  Married   Allergies  Allergen Reactions   Bactrim  [Sulfamethoxazole -Trimethoprim ]     Confusion, dizziness   Family History  Problem Relation Age of Onset   Rheum arthritis Mother    Coronary artery disease Father    Heart attack Father    Cancer Brother        ? TYPE   Colon cancer Neg Hx    Stomach cancer Neg Hx    Pancreatic cancer Neg Hx      Current Outpatient Medications (Cardiovascular):    isosorbide  mononitrate (IMDUR ) 30 MG 24 hr tablet, Take 0.5 tablets (15 mg total) by mouth daily.   lisinopril  (ZESTRIL ) 10 MG tablet, Take 10 mg  by mouth daily.   metoprolol  succinate (TOPROL -XL) 25 MG 24 hr tablet, Take 1 tablet (25 mg total) by mouth daily.   nitroGLYCERIN  (NITROSTAT ) 0.4 MG SL tablet, Place 1 tablet (0.4 mg total) under the tongue every 5 (five) minutes as needed for chest pain.   rosuvastatin  (CRESTOR ) 40 MG tablet, Take 1 tablet (40 mg total) by mouth daily.    Current Outpatient Medications (Hematological):    clopidogrel  (PLAVIX ) 75 MG tablet, Take 1 tablet (75 mg total) by mouth daily.  Current Outpatient Medications (Other):    ALPRAZolam  (XANAX ) 0.25 MG tablet, Take 1 tablet (0.25 mg total) by mouth daily as needed for anxiety (Take 1-2 tablets 30 minutes prior to MRI).   Cholecalciferol (VITAMIN D3) 50 MCG (2000 UT) capsule, Take 2,000 Units by mouth daily.   lidocaine  (LIDODERM ) 5 %, Place 1 patch onto the skin daily. Remove & Discard patch within 12 hours or as directed by MD   Polyethyl Glycol-Propyl Glycol (SYSTANE ULTRA) 0.4-0.3 % SOLN, Place 1 drop into both eyes at bedtime.   psyllium (METAMUCIL) 58.6 % packet, Take 1 packet by mouth daily.   tamsulosin  (FLOMAX ) 0.4 MG CAPS capsule, Take 0.8 mg by mouth every morning.   zolpidem  (AMBIEN ) 10 MG tablet, Take 1 tablet (10 mg total) by mouth at bedtime as needed for sleep.   Reviewed prior external information including notes and imaging from  primary care provider As well as notes that were  available from care everywhere and other healthcare systems.    Objective  Blood pressure (!) 150/70, pulse 84, height 5' 9 (1.753 m), weight 150 lb 3.2 oz (68.1 kg), SpO2 96%.   General: No apparent distress alert and oriented x3 mood and affect normal, dressed appropriately.  HEENT: Pupils equal, extraocular movements intact  Respiratory: Patient's speak in full sentences and does not appear short of breath  Cardiovascular: No lower extremity edema, non tender, no erythema  Low back does have some loss of lordosis noted.  Tenderness to palpation noted. Negative straight leg test.  Able to get out of a chair without any significant difficulty.   Impression and Recommendations:     The above documentation has been reviewed and is accurate and complete Disney Ruggiero M Nima Kemppainen, DO

## 2024-03-14 ENCOUNTER — Ambulatory Visit: Admitting: Family Medicine

## 2024-03-14 VITALS — BP 150/70 | HR 84 | Ht 69.0 in | Wt 150.2 lb

## 2024-03-14 DIAGNOSIS — N400 Enlarged prostate without lower urinary tract symptoms: Secondary | ICD-10-CM | POA: Diagnosis not present

## 2024-03-14 NOTE — Assessment & Plan Note (Signed)
 CT scan did show some enlargement is noted.  Referred to urology for further evaluation.  Discussed icing regimen and home exercises, discussed which activities to do and which ones to avoid.  Increase activity slowly.  Discussed icing regimen.  Follow-up again in 3 months if needed with me.

## 2024-03-14 NOTE — Patient Instructions (Addendum)
 Good to see you. Miralax 17 g daily Colase 100 mg daily. See me again in 3 months.

## 2024-03-16 LAB — GENECONNECT MOLECULAR SCREEN: Genetic Analysis Overall Interpretation: NEGATIVE

## 2024-03-19 ENCOUNTER — Other Ambulatory Visit: Payer: Self-pay | Admitting: Cardiovascular Disease

## 2024-03-25 ENCOUNTER — Ambulatory Visit
Admission: RE | Admit: 2024-03-25 | Discharge: 2024-03-25 | Disposition: A | Discharge status: Home / Self Care | Source: Ambulatory Visit | Attending: Family Medicine | Admitting: Family Medicine

## 2024-03-25 DIAGNOSIS — M5127 Other intervertebral disc displacement, lumbosacral region: Secondary | ICD-10-CM | POA: Diagnosis not present

## 2024-03-25 DIAGNOSIS — M48061 Spinal stenosis, lumbar region without neurogenic claudication: Secondary | ICD-10-CM | POA: Diagnosis not present

## 2024-03-25 DIAGNOSIS — M4316 Spondylolisthesis, lumbar region: Secondary | ICD-10-CM | POA: Diagnosis not present

## 2024-03-25 DIAGNOSIS — G8929 Other chronic pain: Secondary | ICD-10-CM

## 2024-03-25 DIAGNOSIS — M47816 Spondylosis without myelopathy or radiculopathy, lumbar region: Secondary | ICD-10-CM | POA: Diagnosis not present

## 2024-03-28 ENCOUNTER — Ambulatory Visit: Payer: Self-pay | Admitting: Family Medicine

## 2024-03-28 NOTE — Progress Notes (Signed)
 His MRI shows some mild worsening at the L5-S1 level. How are his symptoms now? He should follow up with his sports medicine provider or orthopedist if not back to his baseline. Follow up with PCP also.

## 2024-03-28 NOTE — Telephone Encounter (Signed)
 Ok to place referral to ENT for hearing exam?

## 2024-04-05 ENCOUNTER — Ambulatory Visit: Admitting: Urology

## 2024-04-10 NOTE — Progress Notes (Unsigned)
 [name] [address] [place] [address] Phone: [phone] Subjective:   [name] [name], am serving as a scribe for Dr. Arthea [name].  I'm seeing this patient by the request  of:  [name] Debby CROME, MD  CC: Left hip and leg pain  YEP:[name] [name]  [name] [name] is a 89 y.o. male coming in with complaint of L hip pain. Patient states that IBU has been helping pain. Stiffness started this week. Pain in glute.    Recently did have imaging of the lumbar spine and central stenosis with a right foraminal 5 and moderate right foraminal stenosis at L5-S1.  Past Medical History:  Diagnosis Date   Anxiety    CAD (coronary artery disease)    a. s/p stent to LAD 2008;  b. abnormal ETT 11/13 with VTach => LHC pLAD 30% ISR, mCFX 30%, dRCA 60-75% which had progressed from previous study in 2010 => FFR of RCA 0.92 => med Rx    Carotid stenosis    dopplers 1/14:  0-39% bilateral ICA stenosis   Colon polyps    Colon polyps    adenomatous   Diverticular disease of colon    Hemorrhoids    external and internal   History of BPH    HTN (hypertension)    Hx of echocardiogram    a. Echo 9/11:  EF 55-60%, normal motion, grade 2 diastolic dysfunction, mild MR, mild LAE, mild RAE, PASP 33.   Hyperlipidemia    IBS (irritable bowel syndrome)    Rhinitis    Stroke, lacunar (HCC) 08/24/2012   Old, 2011 head mri   Past Surgical History:  Procedure Laterality Date   APPENDECTOMY  05/17/1954   CORONARY STENT INTERVENTION N/A 06/15/2022   Procedure: CORONARY STENT INTERVENTION;  Surgeon: [name] Candyce RAMAN, MD;  Location: [facility] INVASIVE CV LAB;  Service: Cardiovascular;  Laterality: N/A;   CORONARY STENT PLACEMENT  05/17/2006    Successful PCI of the lesion in the proximal LAD using a Promus    HERNIA REPAIR Right 05/18/2003   LEFT HEART CATH AND CORONARY ANGIOGRAPHY N/A 06/15/2022   Procedure: LEFT HEART CATH AND CORONARY ANGIOGRAPHY;  Surgeon: [name] Candyce RAMAN, MD;  Location: [facility] INVASIVE CV LAB;  Service: Cardiovascular;  Laterality: N/A;   LEFT HEART CATH AND CORONARY ANGIOGRAPHY N/A 03/10/2023   Procedure: LEFT HEART CATH AND CORONARY ANGIOGRAPHY;  Surgeon: [name] Sharper, MD;  Location: [facility] INVASIVE CV LAB;  Service: Cardiovascular;  Laterality: N/A;   NASAL SINUS SURGERY  05/17/1973   PERCUTANEOUS CORONARY STENT INTERVENTION (PCI-S) N/A 05/03/2012   Procedure: PERCUTANEOUS CORONARY STENT INTERVENTION (PCI-S);  Surgeon: [name] Wonda, MD;  Location: [facility] CATH LAB;  Service: Cardiovascular;  Laterality: N/A;   Promus DES ok for 3T MRI  05/17/2006   Social History   Socioeconomic History   Marital status: Married    Spouse name: Not on file   Number of children: 3   Years of education: 16   Highest education level: Bachelor's degree (e.g., BA, AB, BS)  Occupational History   Occupation: businessman, Financial Planner: RETIRED  Tobacco Use   Smoking status: Never    Passive exposure: Never   Smokeless tobacco: Never  Vaping Use   Vaping status: Never Used  Substance and Sexual Activity   Alcohol use: Yes    Alcohol/week: 1.0 standard drink of alcohol    Types: 1 Shots of liquor per week    Comment: 3 per  week   Drug use: No   Sexual activity: Yes    Partners: Female  Other Topics Concern   Not on file  Social History Narrative   HSG, Engineer, 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 He has two daughters, 1 in [place] 1 in [place] (Dec '12) and a son who works with him. Several grandchildren.Reports that he spends a good deal of time at his home in [place] [place] [facility] which he greatly enjoys and his home in the [place]. Enjoys driving his BMW convertible when in the mountains.      ACP - Yes CPR, yes for short-term mechanical ventilation for reversible disease. Recommended [email or web address] for consideration.   Social Drivers of  Corporate Investment Banker Strain: Low Risk  (03/01/2024)   Overall Financial Resource Strain (CARDIA)    Difficulty of Paying Living Expenses: Not hard at all  Food Insecurity: No Food Insecurity (03/01/2024)   Hunger Vital Sign    Worried About Running Out of Food in the Last Year: Never true    Ran Out of Food in the Last Year: Never true  Transportation Needs: No Transportation Needs (03/01/2024)   PRAPARE - Administrator, Civil Service (Medical): No    Lack of Transportation (Non-Medical): No  Physical Activity: Insufficiently Active (03/01/2024)   Exercise Vital Sign    Days of Exercise per Week: 3 days    Minutes of Exercise per Session: 40 min  Stress: No Stress Concern Present (03/01/2024)   [name] of Occupational Health - Occupational Stress Questionnaire    Feeling of Stress: Not at all  Social Connections: Unknown (03/01/2024)   Social Connection and Isolation Panel    Frequency of Communication with Friends and Family: Three times a week    Frequency of Social Gatherings with Friends and Family: Three times a week    Attends Religious Services: More than 4 times per year    Active Member of Clubs or Organizations: Not on file    Attends Banker Meetings: Not on file    Marital Status: Married   Allergies  Allergen Reactions   Bactrim  [Sulfamethoxazole -Trimethoprim ]     Confusion, dizziness   Family History  Problem Relation Age of Onset   Rheum arthritis Mother    Coronary artery disease Father    Heart attack Father    Cancer Brother        ? TYPE   Colon cancer Neg Hx    Stomach cancer Neg Hx    Pancreatic cancer Neg Hx      Current Outpatient Medications (Cardiovascular):    isosorbide  mononitrate (IMDUR ) 30 MG 24 hr tablet, Take 0.5 tablets (15 mg total) by mouth daily.   lisinopril  (ZESTRIL ) 10 MG tablet, TAKE 1 TABLET DAILY   metoprolol  succinate (TOPROL -XL) 25 MG 24 hr tablet, Take 1 tablet (25 mg total) by  mouth daily.   nitroGLYCERIN  (NITROSTAT ) 0.4 MG SL tablet, Place 1 tablet (0.4 mg total) under the tongue every 5 (five) minutes as needed for chest pain.   rosuvastatin  (CRESTOR ) 40 MG tablet, Take 1 tablet (40 mg total) by mouth daily.    Current Outpatient Medications (Hematological):    clopidogrel  (PLAVIX ) 75 MG tablet, Take 1 tablet (75 mg total) by mouth daily.  Current Outpatient Medications (Other):    ALPRAZolam  (XANAX ) 0.25 MG tablet, Take 1 tablet (0.25 mg total) by mouth daily as needed for anxiety (  Take 1-2 tablets 30 minutes prior to MRI).   Cholecalciferol (VITAMIN D3) 50 MCG (2000 UT) capsule, Take 2,000 Units by mouth daily.   lidocaine  (LIDODERM ) 5 %, Place 1 patch onto the skin daily. Remove & Discard patch within 12 hours or as directed by MD   Polyethyl Glycol-Propyl Glycol (SYSTANE ULTRA) 0.4-0.3 % SOLN, Place 1 drop into both eyes at bedtime.   psyllium (METAMUCIL) 58.6 % packet, Take 1 packet by mouth daily.   tamsulosin  (FLOMAX ) 0.4 MG CAPS capsule, Take 0.8 mg by mouth every morning.   zolpidem  (AMBIEN ) 10 MG tablet, Take 1 tablet (10 mg total) by mouth at bedtime as needed for sleep.   Reviewed prior external information including notes and imaging from  primary care provider As well as notes that were available from care everywhere and other healthcare systems.  Past medical history, social, surgical and family history all reviewed in electronic medical record.  No pertanent information unless stated regarding to the chief complaint.   Review of Systems:  No headache, visual changes, nausea, vomiting, diarrhea, constipation, dizziness, abdominal pain, skin rash, fevers, chills, night sweats, weight loss, swollen lymph nodes, body aches, joint swelling, chest pain, shortness of breath, mood changes. POSITIVE muscle aches  Objective  Blood pressure 106/60, pulse 76, height 5' 9 (1.753 m), weight 143 lb (64.9 kg), SpO2 96%.   General: No apparent distress  alert and oriented x3 mood and affect normal, dressed appropriately.  HEENT: Pupils equal, extraocular movements intact  Respiratory: Patient's speak in full sentences and does not appear short of breath  Cardiovascular: No lower extremity edema, non tender, no erythema  Low back exam does have mild loss of lordosis noted.  More tightness noted in the left side of the paraspinal musculature.  Hip exam severe tenderness to palpation over the greater trochanteric area.  Mild tightness noted of the gluteal tendon.   Procedure: Real-time Ultrasound Guided Injection of left  greater trochanteric bursitis secondary to patient's body habitus Device: GE Logiq Q7  Ultrasound guided injection is preferred based studies that show increased duration, increased effect, greater accuracy, decreased procedural pain, increased response rate, and decreased cost with ultrasound guided versus blind injection.  Verbal informed consent obtained.  Time-out conducted.  Noted no overlying erythema, induration, or other signs of local infection.  Skin prepped in a sterile fashion.  Local anesthesia: Topical Ethyl chloride.  With sterile technique and under real time ultrasound guidance:  Greater trochanteric area was visualized and patient's bursa was noted. A 22-gauge 3 inch needle was inserted and 4 cc of 0.5% Marcaine and 1 cc of Kenalog  40 mg/dL was injected. Pictures taken Completed without difficulty  Pain immediately resolved suggesting accurate placement of the medication.  Advised to call if fevers/chills, erythema, induration, drainage, or persistent bleeding.  Images permanently stored  Impression: Technically successful ultrasound guided injection.    Impression and Recommendations:    The above documentation has been reviewed and is accurate and complete Vivica Dobosz M Zuhayr Deeney, DO

## 2024-04-11 ENCOUNTER — Encounter: Payer: Self-pay | Admitting: Family Medicine

## 2024-04-11 ENCOUNTER — Other Ambulatory Visit: Payer: Self-pay

## 2024-04-11 ENCOUNTER — Ambulatory Visit: Admitting: Family Medicine

## 2024-04-11 VITALS — BP 106/60 | HR 76 | Ht 69.0 in | Wt 143.0 lb

## 2024-04-11 DIAGNOSIS — M25552 Pain in left hip: Secondary | ICD-10-CM

## 2024-04-11 DIAGNOSIS — M7062 Trochanteric bursitis, left hip: Secondary | ICD-10-CM

## 2024-04-11 DIAGNOSIS — M7061 Trochanteric bursitis, right hip: Secondary | ICD-10-CM

## 2024-04-11 NOTE — Patient Instructions (Addendum)
 Injected L hip  [facility]

## 2024-04-11 NOTE — Assessment & Plan Note (Addendum)
 Chronic problem but been 2 years since we have done an injection.  Hopeful that this will make a difference.  Does have an MRI of the lumbar spine showing that there is a potential.  Discussed icing regimen and home exercises.  Discussed which activities to do and which ones to avoid. If worsening need epidural in the back

## 2024-04-18 ENCOUNTER — Encounter: Payer: Self-pay | Admitting: Family Medicine

## 2024-04-23 ENCOUNTER — Ambulatory Visit: Admitting: Urology

## 2024-04-23 ENCOUNTER — Encounter: Payer: Self-pay | Admitting: Urology

## 2024-04-23 VITALS — BP 131/78 | HR 81 | Ht 69.0 in | Wt 136.0 lb

## 2024-04-23 DIAGNOSIS — R351 Nocturia: Secondary | ICD-10-CM | POA: Insufficient documentation

## 2024-04-23 DIAGNOSIS — R3129 Other microscopic hematuria: Secondary | ICD-10-CM | POA: Insufficient documentation

## 2024-04-23 DIAGNOSIS — N138 Other obstructive and reflux uropathy: Secondary | ICD-10-CM

## 2024-04-23 DIAGNOSIS — N401 Enlarged prostate with lower urinary tract symptoms: Secondary | ICD-10-CM | POA: Diagnosis not present

## 2024-04-23 DIAGNOSIS — R10A1 Flank pain, right side: Secondary | ICD-10-CM

## 2024-04-23 LAB — MICROSCOPIC EXAMINATION

## 2024-04-23 LAB — URINALYSIS, ROUTINE W REFLEX MICROSCOPIC
Bilirubin, UA: NEGATIVE
Glucose, UA: NEGATIVE
Ketones, UA: NEGATIVE
Leukocytes,UA: NEGATIVE
Nitrite, UA: NEGATIVE
Specific Gravity, UA: 1.025 (ref 1.005–1.030)
Urobilinogen, Ur: 0.2 mg/dL (ref 0.2–1.0)
pH, UA: 5.5 (ref 5.0–7.5)

## 2024-04-23 LAB — BLADDER SCAN AMB NON-IMAGING

## 2024-04-23 MED ORDER — GEMTESA 75 MG PO TABS: 75.0000 mg | ORAL_TABLET | Freq: Every day | ORAL | Status: AC

## 2024-04-23 NOTE — Progress Notes (Signed)
 Assessment: 1. BPH with obstruction/lower urinary tract symptoms   2. Nocturia   3. Right flank pain   4. Microscopic hematuria     Plan: I personally reviewed the patient's chart including provider notes, lab results. I personally reviewed the CT study from 9/25.  Results noted below.  I do not think that the right sided flank pain is urologic in nature.  This is likely musculoskeletal. Continue tamsulosin . Trial of Gemtesa  75 mg daily.  Samples provided.  Today I had a discussion with the patient regarding the findings of microscopic hematuria including the implications and differential diagnoses associated with it.  I also discussed recommendations for further evaluation including the rationale for upper tract imaging and cystoscopy.  I discussed the nature of these procedures including potential risk and complications.  The patient expressed an understanding of these issues. Scheduled for cystoscopy.     Chief Complaint:  Chief Complaint  Patient presents with   Benign Prostatic Hypertrophy    History of Present Illness:  [name] [name] is a 89 y.o. male who is seen in consultation from Arthea Sharps, DO for evaluation of BPH with lower urinary tract symptoms and right sided flank pain. He has previously been followed by Dr. Gaston with [facility] and was last seen in September 2025.  He has been managed with tamsulosin  0.8 mg.  He has also been treated with PTNS. He has failed a number of OAB medications and has previously been given desmopressin.  At his last visit in September 2025, InterStim trial was discussed. He reports that his lower urinary tract symptoms are relatively stable.  He continues with some frequency and nocturia 3-4 times.  No dysuria or incontinence.  He does report some discomfort in the right groin area with urination.  He reports this as a 2-3/10.  He has also had a 35-month history of some intermittent right flank and back pain. No history of  kidney stones.  CT renal stone study from 02/06/2024 showed a simple right renal cyst, no renal or ureteral calculi, no obstruction, enlarged prostate with protrusion into the bladder base.  PSA 7/25: 1.20  Past Medical History:  Past Medical History:  Diagnosis Date   Anxiety    CAD (coronary artery disease)    a. s/p stent to LAD 2008;  b. abnormal ETT 11/13 with VTach => LHC pLAD 30% ISR, mCFX 30%, dRCA 60-75% which had progressed from previous study in 2010 => FFR of RCA 0.92 => med Rx    Carotid stenosis    dopplers 1/14:  0-39% bilateral ICA stenosis   Colon polyps    Colon polyps    adenomatous   Diverticular disease of colon    Hemorrhoids    external and internal   History of BPH    HTN (hypertension)    Hx of echocardiogram    a. Echo 9/11:  EF 55-60%, normal motion, grade 2 diastolic dysfunction, mild MR, mild LAE, mild RAE, PASP 33.   Hyperlipidemia    IBS (irritable bowel syndrome)    Rhinitis    Stroke, lacunar (HCC) 08/24/2012   Old, 2011 head mri    Past Surgical History:  Past Surgical History:  Procedure Laterality Date   APPENDECTOMY  05/17/1954   CORONARY STENT INTERVENTION N/A 06/15/2022   Procedure: CORONARY STENT INTERVENTION;  Surgeon: Dann Candyce RAMAN, MD;  Location: [facility] INVASIVE CV LAB;  Service: Cardiovascular;  Laterality: N/A;   CORONARY STENT PLACEMENT  05/17/2006    Successful  PCI of the lesion in the proximal LAD using a Promus    HERNIA REPAIR Right 05/18/2003   LEFT HEART CATH AND CORONARY ANGIOGRAPHY N/A 06/15/2022   Procedure: LEFT HEART CATH AND CORONARY ANGIOGRAPHY;  Surgeon: Dann Candyce RAMAN, MD;  Location: [facility] INVASIVE CV LAB;  Service: Cardiovascular;  Laterality: N/A;   LEFT HEART CATH AND CORONARY ANGIOGRAPHY N/A 03/10/2023   Procedure: LEFT HEART CATH AND CORONARY ANGIOGRAPHY;  Surgeon: Wonda Sharper, MD;  Location: [facility] INVASIVE CV LAB;  Service: Cardiovascular;  Laterality: N/A;   NASAL SINUS SURGERY  05/17/1973    PERCUTANEOUS CORONARY STENT INTERVENTION (PCI-S) N/A 05/03/2012   Procedure: PERCUTANEOUS CORONARY STENT INTERVENTION (PCI-S);  Surgeon: Sharper Wonda, MD;  Location: [facility] CATH LAB;  Service: Cardiovascular;  Laterality: N/A;   Promus DES ok for 3T MRI  05/17/2006    Allergies:  Allergies  Allergen Reactions   Bactrim  [Sulfamethoxazole -Trimethoprim ]     Confusion, dizziness    Family History:  Family History  Problem Relation Age of Onset   Rheum arthritis Mother    Coronary artery disease Father    Heart attack Father    Cancer Brother        ? TYPE   Colon cancer Neg Hx    Stomach cancer Neg Hx    Pancreatic cancer Neg Hx     Social History:  Social History   Tobacco Use   Smoking status: Never    Passive exposure: Never   Smokeless tobacco: Never  Vaping Use   Vaping status: Never Used  Substance Use Topics   Alcohol use: Yes    Alcohol/week: 1.0 standard drink of alcohol    Types: 1 Shots of liquor per week    Comment: 3 per week   Drug use: No    Review of symptoms:  Constitutional:  Negative for unexplained weight loss, night sweats, fever, chills ENT:  Negative for nose bleeds, sinus pain, painful swallowing CV:  Negative for chest pain, shortness of breath, exercise intolerance, palpitations, loss of consciousness Resp:  Negative for cough, wheezing, shortness of breath GI:  Negative for nausea, vomiting, diarrhea, bloody stools GU:  Positives noted in HPI; otherwise negative for gross hematuria, dysuria, urinary incontinence Neuro:  Negative for seizures, poor balance, limb weakness, slurred speech Psych:  Negative for lack of energy, depression, anxiety Endocrine:  Negative for polydipsia, polyuria, symptoms of hypoglycemia (dizziness, hunger, sweating) Hematologic:  Negative for anemia, purpura, petechia, prolonged or excessive bleeding, use of anticoagulants  Allergic:  Negative for difficulty breathing or choking as a result of exposure to anything; no  shellfish allergy; no allergic response (rash/itch) to materials, foods  Physical exam: BP 131/78   Pulse 81   Ht 5' 9 (1.753 m)   Wt 136 lb (61.7 kg)   BMI 20.08 kg/m  GENERAL APPEARANCE:  Well appearing, well developed, well nourished, NAD HEENT: Atraumatic, Normocephalic, oropharynx clear. NECK: Supple without lymphadenopathy or thyromegaly. LUNGS: Clear to auscultation bilaterally. HEART: Regular Rate and Rhythm without murmurs, gallops, or rubs. ABDOMEN: Soft, non-tender, No Masses. EXTREMITIES: Moves all extremities well.  Without clubbing, cyanosis, or edema. NEUROLOGIC:  Alert and oriented x 3, normal gait, CN II-XII grossly intact.  MENTAL STATUS:  Appropriate. BACK:  Non-tender to palpation.  No CVAT SKIN:  Warm, dry and intact.   GU: Penis:  circumcised Meatus: Normal Scrotum: normal, no masses; no hernia palpated. Testis: normal without masses bilateral Prostate: 40 g, NT, no nodules Rectum: Normal tone,  no masses or tenderness  Results: U/A: 0-5 WBCs, 11-30 RBCs, few bacteria  PVR = 67 ml

## 2024-04-24 ENCOUNTER — Ambulatory Visit: Admitting: Family Medicine

## 2024-04-24 ENCOUNTER — Ambulatory Visit: Payer: Self-pay

## 2024-04-24 ENCOUNTER — Encounter: Payer: Self-pay | Admitting: Family Medicine

## 2024-04-24 VITALS — BP 110/64 | HR 71 | Temp 98.5°F | Ht 69.0 in | Wt 141.8 lb

## 2024-04-24 DIAGNOSIS — K529 Noninfective gastroenteritis and colitis, unspecified: Secondary | ICD-10-CM | POA: Diagnosis not present

## 2024-04-24 DIAGNOSIS — R197 Diarrhea, unspecified: Secondary | ICD-10-CM

## 2024-04-24 LAB — COMPREHENSIVE METABOLIC PANEL WITH GFR
ALT: 25 U/L (ref 0–53)
AST: 18 U/L (ref 0–37)
Albumin: 4.1 g/dL (ref 3.5–5.2)
Alkaline Phosphatase: 53 U/L (ref 39–117)
BUN: 17 mg/dL (ref 6–23)
CO2: 22 meq/L (ref 19–32)
Calcium: 9.5 mg/dL (ref 8.4–10.5)
Chloride: 109 meq/L (ref 96–112)
Creatinine, Ser: 1.01 mg/dL (ref 0.40–1.50)
GFR: 66.19 mL/min (ref >60.00)
Glucose, Bld: 95 mg/dL (ref 70–99)
Potassium: 3.9 meq/L (ref 3.5–5.1)
Sodium: 139 meq/L (ref 135–145)
Total Bilirubin: 0.4 mg/dL (ref 0.2–1.2)
Total Protein: 6.3 g/dL (ref 6.0–8.3)

## 2024-04-24 LAB — CBC WITH DIFFERENTIAL/PLATELET
Basophils Absolute: 0 K/uL (ref 0.0–0.1)
Basophils Relative: 0.5 % (ref 0.0–3.0)
Eosinophils Absolute: 0.1 K/uL (ref 0.0–0.7)
Eosinophils Relative: 1.8 % (ref 0.0–5.0)
HCT: 39.3 % (ref 39.0–52.0)
Hemoglobin: 13.3 g/dL (ref 13.0–17.0)
Lymphocytes Relative: 20 % (ref 12.0–46.0)
Lymphs Abs: 1.2 K/uL (ref 0.7–4.0)
MCHC: 33.8 g/dL (ref 30.0–36.0)
MCV: 91.1 fl (ref 78.0–100.0)
Monocytes Absolute: 0.6 K/uL (ref 0.1–1.0)
Monocytes Relative: 9.6 % (ref 3.0–12.0)
Neutro Abs: 4 K/uL (ref 1.4–7.7)
Neutrophils Relative %: 68.1 % (ref 43.0–77.0)
Platelets: 160 K/uL (ref 150.0–400.0)
RBC: 4.31 Mil/uL (ref 4.22–5.81)
RDW: 13.1 % (ref 11.5–15.5)
WBC: 5.8 K/uL (ref 4.0–10.5)

## 2024-04-24 LAB — LIPASE: Lipase: 33 U/L (ref 11.0–59.0)

## 2024-04-24 NOTE — Progress Notes (Signed)
 Acute Office Visit  Subjective:     Patient ID: [name] [name], male    DOB: Aug 30, 1932, 89 y.o.   MRN: [id number]  Chief Complaint  Patient presents with   Acute Visit    Off and on for mths has worsened and been consistent for past 3 wks has had 4-5 episodes today     HPI  Discussed the use of AI scribe software for clinical note transcription with the patient, who gave verbal consent to proceed.  History of Present Illness [name] [name] is an 89 year old male with chronic kidney disease who presents with worsening diarrhea.  Diarrhea and gastrointestinal symptoms - Worsening loose stools for three weeks, occurring both day and night, including at 2 AM and 5 AM - Stools are brown and unchanged in color - Frequent, uncomfortable bowel movements without pain, blood, nausea, dizziness, or reflux - No recent stool study performed - Temporary normalization of bowel movements while on a sulfa  medication for chronic kidney disease for approximately 22 days, with recurrence of loose stools after discontinuation - Previous improvement in gastrointestinal symptoms with Augmentin  - No benefit from daily Metamucil use for over 20 years for current symptoms - Previously used Miralax effectively for constipation  Weight loss - Associated weight loss with worsening diarrhea  Abdominal and musculoskeletal pain - No current abdominal pain - Chronic muscular back pain identified by his daughter  Hydration and fluid intake - Drinks plenty of fluids - Limits evening fluid intake to reduce nocturia     ROS Per HPI      Objective:    BP 110/64 (BP Location: Left Arm, Patient Position: Sitting)   Pulse 71   Temp 98.5 F (36.9 C) (Temporal)   Ht 5' 9 (1.753 m)   Wt 141 lb 12.8 oz (64.3 kg)   SpO2 97%   BMI 20.94 kg/m    Physical Exam Vitals and nursing note reviewed.  Constitutional:      General: He is not in acute distress.    Appearance: Normal appearance.   HENT:     Head: Normocephalic and atraumatic.     Right Ear: External ear normal.     Left Ear: External ear normal.     Nose: Nose normal.     Mouth/Throat:     Mouth: Mucous membranes are moist.     Pharynx: Oropharynx is clear.  Eyes:     Extraocular Movements: Extraocular movements intact.  Cardiovascular:     Rate and Rhythm: Normal rate and regular rhythm.     Pulses: Normal pulses.     Heart sounds: Normal heart sounds.  Pulmonary:     Effort: Pulmonary effort is normal. No respiratory distress.     Breath sounds: Normal breath sounds. No wheezing, rhonchi or rales.  Abdominal:     General: There is no distension.     Palpations: Abdomen is soft. There is no mass.     Tenderness: There is no abdominal tenderness. There is no guarding or rebound.     Hernia: No hernia is present.     Comments: Hyperactive bowel sounds  Musculoskeletal:        General: Normal range of motion.     Cervical back: Normal range of motion.     Right lower leg: No edema.     Left lower leg: No edema.  Lymphadenopathy:     Cervical: No cervical adenopathy.  Skin:    General: Skin is warm and dry.  Neurological:     General: No focal deficit present.     Mental Status: He is alert and oriented to person, place, and time.  Psychiatric:        Mood and Affect: Mood normal.        Behavior: Behavior normal.     Results for orders placed or performed in visit on 04/24/24  CBC with Differential/Platelet  Result Value Ref Range   WBC 5.8 4.0 - 10.5 K/uL   RBC 4.31 4.22 - 5.81 Mil/uL   Hemoglobin 13.3 13.0 - 17.0 g/dL   HCT 60.6 60.9 - 47.9 %   MCV 91.1 78.0 - 100.0 fl   MCHC 33.8 30.0 - 36.0 g/dL   RDW 86.8 88.4 - 84.4 %   Platelets 160.0 150.0 - 400.0 K/uL   Neutrophils Relative % 68.1 43.0 - 77.0 %   Lymphocytes Relative 20.0 12.0 - 46.0 %   Monocytes Relative 9.6 3.0 - 12.0 %   Eosinophils Relative 1.8 0.0 - 5.0 %   Basophils Relative 0.5 0.0 - 3.0 %   Neutro Abs 4.0 1.4 - 7.7  K/uL   Lymphs Abs 1.2 0.7 - 4.0 K/uL   Monocytes Absolute 0.6 0.1 - 1.0 K/uL   Eosinophils Absolute 0.1 0.0 - 0.7 K/uL   Basophils Absolute 0.0 0.0 - 0.1 K/uL  Comprehensive metabolic panel with GFR  Result Value Ref Range   Sodium 139 135 - 145 mEq/L   Potassium 3.9 3.5 - 5.1 mEq/L   Chloride 109 96 - 112 mEq/L   CO2 22 19 - 32 mEq/L   Glucose, Bld 95 70 - 99 mg/dL   BUN 17 6 - 23 mg/dL   Creatinine, Ser 8.98 0.40 - 1.50 mg/dL   Total Bilirubin 0.4 0.2 - 1.2 mg/dL   Alkaline Phosphatase 53 39 - 117 U/L   AST 18 0 - 37 U/L   ALT 25 0 - 53 U/L   Total Protein 6.3 6.0 - 8.3 g/dL   Albumin 4.1 3.5 - 5.2 g/dL   GFR 33.80 >39.99 mL/min   Calcium  9.5 8.4 - 10.5 mg/dL  Lipase  Result Value Ref Range   Lipase 33.0 11.0 - 59.0 U/L  GI Profile, Stool, PCR  Result Value Ref Range   Campylobacter Not Detected Not Detected   C difficile toxin A/B Detected (A) Not Detected   Plesiomonas shigelloides Not Detected Not Detected   Salmonella Not Detected Not Detected   Vibrio Not Detected Not Detected   Vibrio cholerae Not Detected Not Detected   Yersinia enterocolitica Not Detected Not Detected   Enteroaggregative E coli Not Detected Not Detected   Enteropathogenic E coli Not Detected Not Detected   Enterotoxigenic E coli Not Detected Not Detected   Shiga-toxin-producing E coli Not Detected Not Detected   E coli O157 Not applicable Not Detected   Shigella/Enteroinvasive E coli Not Detected Not Detected   Cryptosporidium Not Detected Not Detected   Cyclospora cayetanensis Not Detected Not Detected   Entamoeba histolytica Not Detected Not Detected   Giardia lamblia Not Detected Not Detected   Adenovirus F 40/41 Not Detected Not Detected   Astrovirus Not Detected Not Detected   Norovirus GI/GII Not Detected Not Detected   Rotavirus A Not Detected Not Detected   Sapovirus Not Detected Not Detected        Assessment & Plan:   Assessment and Plan Assessment & Plan Chronic  diarrhea Diarrhea worsened over three weeks, postprandial and nocturnal. No pain, blood, nausea,  dizziness, or reflux. Recent use of Augmentin  increases risk of C diff. Differential includes bacterial infection, antibiotic-associated diarrhea, or other GI disorders. No recent stool studies or GI consultation. No colonoscopy in ten years. Urologist ruled out kidney causes for back pain. - Ordered stool studies for bacterial infection. - Ordered blood tests for kidney and liver function, iron levels, and WBC count, GI pathogen panel - Referred to gastroenterology for evaluation and possible colonoscopy. - Advised on hydration and dietary modifications. - Discussed Imodium for symptomatic relief. - Discussed ER precautions and s/s dehydration     Orders Placed This Encounter  Procedures   CBC with Differential/Platelet    Release to patient:   Immediate [1]   Comprehensive metabolic panel with GFR    Release to patient:   Immediate [1]   Lipase   GI Profile, Stool, PCR     No orders of the defined types were placed in this encounter.   Return if symptoms worsen or fail to improve.  Corean LITTIE Ku, FNP

## 2024-04-24 NOTE — Patient Instructions (Signed)
 We are checking labs today, will be in contact with any results that require further attention.  Referral to GI for further evaluation and treatment.   Follow-up with me for new or worsening symptoms.

## 2024-04-24 NOTE — Telephone Encounter (Signed)
 FYI Only or Action Required?: FYI only for provider: appointment scheduled on 12/9.  Patient was last seen in primary care on 03/01/2024 by Lendia Boby CROME, NP-C.  Called Nurse Triage reporting Diarrhea.  Symptoms began several weeks ago.  Interventions attempted: OTC medications: Metamucil.  Symptoms are: unchanged.  Triage Disposition: See Physician Within 24 Hours  Patient/caregiver understands and will follow disposition?: Yes         Copied from CRM [id number]. Topic: Clinical - Red Word Triage >> Apr 24, 2024 11:10 AM [name] [name] wrote: Reason for CRM:bad case of diarrhea Reason for Disposition  [1] MODERATE diarrhea (e.g., 4-6 times / day more than normal) AND [2] present > 48 hours (2 days)  Answer Assessment - Initial Assessment Questions 1. DIARRHEA SEVERITY: How bad is the diarrhea? How many more stools have you had in the past 24 hours than normal?      X 4   2. ONSET: When did the diarrhea begin?      X 2 weeks getting worse, but ongoing x 1 month   3. STOOL DESCRIPTION:  How loose or watery is the diarrhea? What is the stool color? Is there any blood or mucous in the stool?     Loose liquid   4. VOMITING: Are you also vomiting? If Yes, ask: How many times in the past 24 hours?      No   5. ABDOMEN PAIN: Are you having any abdomen pain? If Yes, ask: What does it feel like? (e.g., crampy, dull, intermittent, constant)      No  7. ORAL INTAKE: If vomiting, Have you been able to drink liquids? How much liquids have you had in the past 24 hours?     N/A  8. HYDRATION: Any signs of dehydration? (e.g., dry mouth [not just dry lips], too weak to stand, dizziness, new weight loss) When did you last urinate?     No    9. EXPOSURE: Have you traveled to a foreign country recently? Have you been exposed to anyone with diarrhea? Could you have eaten any food that was spoiled?     No    10. ANTIBIOTIC USE: Are you taking antibiotics  now or have you taken antibiotics in the past 2 months?       No   11. OTHER SYMPTOMS: Do you have any other symptoms? (e.g., fever, blood in stool)  No     Patient called in to triage with complaints of diarrhea.  This has been ongoing for a month, however in the past 2 weeks it has been getting progressively worse.  The patient stated he has lost 8 lbs in the past 2 weeks. He stated after each meal he has to have a BM right after. The stool is watery. No dizziness, etc noted.  He is taking Metamucil.   Appointment scheduled for further evaluation; and agrees with the plan of care, and will reach out if symptoms worsen or persist.  Protocols used: [facility]

## 2024-04-25 DIAGNOSIS — R197 Diarrhea, unspecified: Secondary | ICD-10-CM | POA: Diagnosis not present

## 2024-04-25 NOTE — Progress Notes (Signed)
 [name] [address] [place] [address] Phone: [phone] Subjective:   [name] [name], am serving as a scribe for Dr. Arthea Claudene.  I'm seeing this patient by the request  of:  Joshua Debby CROME, MD  CC: Back and hip pain follow-up  YEP:Dlagzrupcz  04/11/2024 Chronic problem but been 2 years since we have done an injection.  Hopeful that this will make a difference.  Does have an MRI of the lumbar spine showing that there is a potential.  Discussed icing regimen and home exercises.  Discussed which activities to do and which ones to avoid. If worsening need epidural in the back     Update 04/26/2024 [name] [name] is a 89 y.o. male coming in with complaint of B hip pain. Patient states that he has pain in the L proximal hamstring. Feels like he is dropping his foot when he walks at the park.    Past Medical History:  Diagnosis Date   Anxiety    CAD (coronary artery disease)    a. s/p stent to LAD 2008;  b. abnormal ETT 11/13 with VTach => LHC pLAD 30% ISR, mCFX 30%, dRCA 60-75% which had progressed from previous study in 2010 => FFR of RCA 0.92 => med Rx    Carotid stenosis    dopplers 1/14:  0-39% bilateral ICA stenosis   Colon polyps    Colon polyps    adenomatous   Diverticular disease of colon    Hemorrhoids    external and internal   History of BPH    HTN (hypertension)    Hx of echocardiogram    a. Echo 9/11:  EF 55-60%, normal motion, grade 2 diastolic dysfunction, mild MR, mild LAE, mild RAE, PASP 33.   Hyperlipidemia    IBS (irritable bowel syndrome)    Rhinitis    Stroke, lacunar (HCC) 08/24/2012   Old, 2011 head mri   Past Surgical History:  Procedure Laterality Date   APPENDECTOMY  05/17/1954   CORONARY STENT INTERVENTION N/A 06/15/2022   Procedure: CORONARY STENT INTERVENTION;  Surgeon: Dann Candyce RAMAN, MD;  Location: [facility] INVASIVE CV LAB;  Service: Cardiovascular;  Laterality: N/A;   CORONARY STENT PLACEMENT   05/17/2006    Successful PCI of the lesion in the proximal LAD using a Promus    HERNIA REPAIR Right 05/18/2003   LEFT HEART CATH AND CORONARY ANGIOGRAPHY N/A 06/15/2022   Procedure: LEFT HEART CATH AND CORONARY ANGIOGRAPHY;  Surgeon: Dann Candyce RAMAN, MD;  Location: [facility] INVASIVE CV LAB;  Service: Cardiovascular;  Laterality: N/A;   LEFT HEART CATH AND CORONARY ANGIOGRAPHY N/A 03/10/2023   Procedure: LEFT HEART CATH AND CORONARY ANGIOGRAPHY;  Surgeon: Wonda Sharper, MD;  Location: [facility] INVASIVE CV LAB;  Service: Cardiovascular;  Laterality: N/A;   NASAL SINUS SURGERY  05/17/1973   PERCUTANEOUS CORONARY STENT INTERVENTION (PCI-S) N/A 05/03/2012   Procedure: PERCUTANEOUS CORONARY STENT INTERVENTION (PCI-S);  Surgeon: Sharper Wonda, MD;  Location: [facility] CATH LAB;  Service: Cardiovascular;  Laterality: N/A;   Promus DES ok for 3T MRI  05/17/2006   Social History   Socioeconomic History   Marital status: Married    Spouse name: Not on file   Number of children: 3   Years of education: 16   Highest education level: Bachelor's degree (e.g., BA, AB, BS)  Occupational History   Occupation: businessman, Financial Planner: RETIRED  Tobacco Use   Smoking status: Never  Passive exposure: Never   Smokeless tobacco: Never  Vaping Use   Vaping status: Never Used  Substance and Sexual Activity   Alcohol use: Yes    Alcohol/week: 1.0 standard drink of alcohol    Types: 1 Shots of liquor per week    Comment: 3 per week   Drug use: No   Sexual activity: Yes    Partners: Female  Other Topics Concern   Not on file  Social History Narrative   HSG, Engineer, 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 He has two daughters, 1 in [place] 1 in [place] (Dec '12) and a son who works with him. Several grandchildren.Reports that he spends a good deal of time at his home in [place] which he greatly enjoys  and his home in the [place]. Enjoys driving his BMW convertible when in the mountains.      ACP - Yes CPR, yes for short-term mechanical ventilation for reversible disease. Recommended [email or web address] for consideration.   Social Drivers of Health   Tobacco Use: Low Risk (04/24/2024)   Patient History    Smoking Tobacco Use: Never    Smokeless Tobacco Use: Never    Passive Exposure: Never  Financial Resource Strain: Low Risk (03/01/2024)   Overall Financial Resource Strain (CARDIA)    Difficulty of Paying Living Expenses: Not hard at all  Food Insecurity: No Food Insecurity (03/01/2024)   Epic    Worried About Programme Researcher, Broadcasting/film/video in the Last Year: Never true    Ran Out of Food in the Last Year: Never true  Transportation Needs: No Transportation Needs (03/01/2024)   Epic    Lack of Transportation (Medical): No    Lack of Transportation (Non-Medical): No  Physical Activity: Insufficiently Active (03/01/2024)   Exercise Vital Sign    Days of Exercise per Week: 3 days    Minutes of Exercise per Session: 40 min  Stress: No Stress Concern Present (03/01/2024)   [name] of Occupational Health - Occupational Stress Questionnaire    Feeling of Stress: Not at all  Social Connections: Unknown (03/01/2024)   Social Connection and Isolation Panel    Frequency of Communication with Friends and Family: Three times a week    Frequency of Social Gatherings with Friends and Family: Three times a week    Attends Religious Services: More than 4 times per year    Active Member of Clubs or Organizations: Not on file    Attends Club or Organization Meetings: Not on file    Marital Status: Married  Depression [phone]): Low Risk (02/01/2024)   Depression (PHQ2-9)    PHQ-2 Score: 2  Alcohol Screen: Low Risk (03/01/2024)   Alcohol Screen    Last Alcohol Screening Score (AUDIT): 3  Housing: Unknown (03/01/2024)   Epic    Unable to Pay for Housing in the Last Year: No    Number  of Times Moved in the Last Year: Not on file    Homeless in the Last Year: Not on file  Utilities: Not At Risk (11/21/2023)   Epic    Threatened with loss of utilities: No  Health Literacy: Adequate Health Literacy (11/21/2023)   B1300 Health Literacy    Frequency of need for help with medical instructions: Never   Allergies  Allergen Reactions   Bactrim  [Sulfamethoxazole -Trimethoprim ]     Confusion, dizziness   Family History  Problem Relation Age of Onset   Rheum arthritis  Mother    Coronary artery disease Father    Heart attack Father    Cancer Brother        ? TYPE   Colon cancer Neg Hx    Stomach cancer Neg Hx    Pancreatic cancer Neg Hx     Current Outpatient Medications (Cardiovascular):    isosorbide  mononitrate (IMDUR ) 30 MG 24 hr tablet, Take 0.5 tablets (15 mg total) by mouth daily.   lisinopril  (ZESTRIL ) 10 MG tablet, TAKE 1 TABLET DAILY   metoprolol  succinate (TOPROL -XL) 25 MG 24 hr tablet, Take 1 tablet (25 mg total) by mouth daily.   nitroGLYCERIN  (NITROSTAT ) 0.4 MG SL tablet, Place 1 tablet (0.4 mg total) under the tongue every 5 (five) minutes as needed for chest pain.   rosuvastatin  (CRESTOR ) 40 MG tablet, Take 1 tablet (40 mg total) by mouth daily.  Current Outpatient Medications (Hematological):    clopidogrel  (PLAVIX ) 75 MG tablet, Take 1 tablet (75 mg total) by mouth daily.  Current Outpatient Medications (Other):    ALPRAZolam  (XANAX ) 0.25 MG tablet, Take 1 tablet (0.25 mg total) by mouth daily as needed for anxiety (Take 1-2 tablets 30 minutes prior to MRI).   Cholecalciferol (VITAMIN D3) 50 MCG (2000 UT) capsule, Take 2,000 Units by mouth daily.   hydrocortisone 2.5 % cream, SMARTSIG:sparingly Topical Twice Daily   lidocaine  (LIDODERM ) 5 %, Place 1 patch onto the skin daily. Remove & Discard patch within 12 hours or as directed by MD   Polyethyl Glycol-Propyl Glycol (SYSTANE ULTRA) 0.4-0.3 % SOLN, Place 1 drop into both eyes at bedtime.   psyllium  (METAMUCIL) 58.6 % packet, Take 1 packet by mouth daily. (Patient taking differently: Take 1 packet by mouth daily. 2 tbsp a day)   tamsulosin  (FLOMAX ) 0.4 MG CAPS capsule, Take 0.8 mg by mouth every morning.   Vibegron  (GEMTESA ) 75 MG TABS, Take 1 tablet (75 mg total) by mouth daily.   zolpidem  (AMBIEN ) 10 MG tablet, Take 1 tablet (10 mg total) by mouth at bedtime as needed for sleep.   Reviewed prior external information including notes and imaging from  primary care provider As well as notes that were available from care everywhere and other healthcare systems.  Past medical history, social, surgical and family history all reviewed in electronic medical record.  No pertanent information unless stated regarding to the chief complaint.   Review of Systems:  No headache, visual changes, nausea, vomiting, diarrhea, constipation, dizziness, abdominal pain, skin rash, fevers, chills, night sweats, weight loss, swollen lymph nodes, body aches, joint swelling, chest pain, shortness of breath, mood changes. POSITIVE muscle aches  Objective  Blood pressure 128/78, pulse 69, height 5' 9 (1.753 m), weight 142 lb (64.4 kg), SpO2 96%.   General: No apparent distress alert and oriented x3 mood and affect normal, dressed appropriately.  HEENT: Pupils equal, extraocular movements intact  Respiratory: Patient's speak in full sentences and does not appear short of breath  Cardiovascular: No lower extremity edema, non tender, no erythema  Low back does have loss of lordosis.  Tenderness to palpation in the paraspinal musculature.  Patient does have tightness with straight leg test on the left side.  Neurovascularly intact distally.    Impression and Recommendations:    The above documentation has been reviewed and is accurate and complete Broderick Fonseca M Thena Devora, DO

## 2024-04-26 ENCOUNTER — Ambulatory Visit: Admitting: Family Medicine

## 2024-04-26 ENCOUNTER — Encounter: Payer: Self-pay | Admitting: Family Medicine

## 2024-04-26 ENCOUNTER — Ambulatory Visit: Payer: Self-pay | Admitting: Family Medicine

## 2024-04-26 ENCOUNTER — Other Ambulatory Visit: Payer: Self-pay

## 2024-04-26 VITALS — BP 128/78 | HR 69 | Ht 69.0 in | Wt 142.0 lb

## 2024-04-26 DIAGNOSIS — M25552 Pain in left hip: Secondary | ICD-10-CM | POA: Diagnosis not present

## 2024-04-26 DIAGNOSIS — M5416 Radiculopathy, lumbar region: Secondary | ICD-10-CM | POA: Diagnosis not present

## 2024-04-26 DIAGNOSIS — M25551 Pain in right hip: Secondary | ICD-10-CM

## 2024-04-26 DIAGNOSIS — M48062 Spinal stenosis, lumbar region with neurogenic claudication: Secondary | ICD-10-CM | POA: Diagnosis not present

## 2024-04-26 DIAGNOSIS — A0472 Enterocolitis due to Clostridium difficile, not specified as recurrent: Secondary | ICD-10-CM

## 2024-04-26 NOTE — Assessment & Plan Note (Signed)
 I believe the patient is having more neurogenic claudication secondary to more of the spinal stenosis noted on the L4-L5 area with patient having the radicular symptoms in the buttocks area.  We discussed with patient that an L4-L5 epidural could be potentially beneficial with diagnostic as well as therapeutic.  Would like to avoid too many medications if possible.  Discussed icing regimen and home exercises.  Follow-up again 6 weeks after the injection.

## 2024-04-26 NOTE — Patient Instructions (Addendum)
 Epidural L4/L5 [id number]

## 2024-04-27 ENCOUNTER — Telehealth: Payer: Self-pay

## 2024-04-27 ENCOUNTER — Encounter: Payer: Self-pay | Admitting: Cardiovascular Disease

## 2024-04-27 LAB — GI PROFILE, STOOL, PCR

## 2024-04-27 MED ORDER — FIDAXOMICIN 200 MG PO TABS: 200.0000 mg | ORAL_TABLET | Freq: Two times a day (BID) | ORAL | 0 refills | Status: AC

## 2024-04-27 NOTE — Telephone Encounter (Signed)
° °  Pre-operative Risk Assessment    Patient Name: [name] [name]  DOB: January 08, 1933 MRN: [id number]   Date of last office visit: 12/07/23 Date of next office visit: Not scheduled   Request for Surgical Clearance    Procedure:  Lumbar epidural  Date of Surgery:  Clearance TBD                                Surgeon:  Not provided Surgeon's Group or Practice Name:  [facility] imaging Phone number:  [phone] Fax number:  [phone]   Type of Clearance Requested:   - Medical  - Pharmacy:  Hold Clopidogrel  (Plavix ) x5 days   Type of Anesthesia:  Not Indicated   Additional requests/questions:    [name] [name]   04/27/2024, 5:03 PM

## 2024-04-30 ENCOUNTER — Telehealth (HOSPITAL_BASED_OUTPATIENT_CLINIC_OR_DEPARTMENT_OTHER): Payer: Self-pay | Admitting: *Deleted

## 2024-04-30 NOTE — Telephone Encounter (Signed)
 Pt has been scheduled tele preop appt. Pt states procedure not scheduled until he has been cleared. Med rec and consent are done.      Patient Consent for Virtual Visit        [name] [name] has provided verbal consent on 04/30/2024 for a virtual visit (video or telephone).   CONSENT FOR VIRTUAL VISIT FOR:  [name] [name]  By participating in this virtual visit I agree to the following:  I hereby voluntarily request, consent and authorize [facility] and its employed or contracted physicians, physician assistants, nurse practitioners or other licensed health care professionals (the Practitioner), to provide me with telemedicine health care services (the Services) as deemed necessary by the treating Practitioner. I acknowledge and consent to receive the Services by the Practitioner via telemedicine. I understand that the telemedicine visit will involve communicating with the Practitioner through live audiovisual communication technology and the disclosure of certain medical information by electronic transmission. I acknowledge that I have been given the opportunity to request an in-person assessment or other available alternative prior to the telemedicine visit and am voluntarily participating in the telemedicine visit.  I understand that I have the right to withhold or withdraw my consent to the use of telemedicine in the course of my care at any time, without affecting my right to future care or treatment, and that the Practitioner or I may terminate the telemedicine visit at any time. I understand that I have the right to inspect all information obtained and/or recorded in the course of the telemedicine visit and may receive copies of available information for a reasonable fee.  I understand that some of the potential risks of receiving the Services via telemedicine include:  Delay or interruption in medical evaluation due to technological equipment failure or disruption; Information  transmitted may not be sufficient (e.g. poor resolution of images) to allow for appropriate medical decision making by the Practitioner; and/or  In rare instances, security protocols could fail, causing a breach of personal health information.  Furthermore, I acknowledge that it is my responsibility to provide information about my medical history, conditions and care that is complete and accurate to the best of my ability. I acknowledge that Practitioner's advice, recommendations, and/or decision may be based on factors not within their control, such as incomplete or inaccurate data provided by me or distortions of diagnostic images or specimens that may result from electronic transmissions. I understand that the practice of medicine is not an exact science and that Practitioner makes no warranties or guarantees regarding treatment outcomes. I acknowledge that a copy of this consent can be made available to me via my patient portal [facility] MyChart), or I can request a printed copy by calling the office of [facility].    I understand that my insurance will be billed for this visit.   I have read or had this consent read to me. I understand the contents of this consent, which adequately explains the benefits and risks of the Services being provided via telemedicine.  I have been provided ample opportunity to ask questions regarding this consent and the Services and have had my questions answered to my satisfaction. I give my informed consent for the services to be provided through the use of telemedicine in my medical care

## 2024-04-30 NOTE — Telephone Encounter (Signed)
° °  Name: [name] [name]  DOB: [birth year]  MRN: [id number]  Primary Cardiologist: Maude Emmer, MD   Preoperative team, please contact this patient and set up a phone call appointment for further preoperative risk assessment. Please obtain consent and complete medication review. Thank you for your help.  I confirm that guidance regarding antiplatelet and oral anticoagulation therapy has been completed and, if necessary, noted below.  Per office protocol, if patient is without any new symptoms or concerns at the time of their virtual visit, he may hold Plavix  for 5 days prior to procedure. Please resume Plavix  as soon as possible postprocedure, at the discretion of the surgeon.    I also confirmed the patient resides in the state of  . As per [facility] Medical Board telemedicine laws, the patient must reside in the state in which the provider is licensed.   Lamarr Satterfield, NP 04/30/2024, 8:08 AM [facility]

## 2024-04-30 NOTE — Telephone Encounter (Signed)
 Pt has been scheduled tele preop appt. Pt states procedure not scheduled until he has been cleared. Med rec and consent are done.

## 2024-05-03 ENCOUNTER — Ambulatory Visit: Attending: Internal Medicine

## 2024-05-03 DIAGNOSIS — Z01810 Encounter for preprocedural cardiovascular examination: Secondary | ICD-10-CM

## 2024-05-03 NOTE — Progress Notes (Signed)
 Virtual Visit via Telephone Note   Because of [name] [name] co-morbid illnesses, he is at least at moderate risk for complications without adequate follow up.  This format is felt to be most appropriate for this patient at this time.  Due to technical limitations with video connection (technology), today's appointment will be conducted as an audio only telehealth visit, and [name] [name] verbally agreed to proceed in this manner.   All issues noted in this document were discussed and addressed.  No physical exam could be performed with this format.  Evaluation Performed:  Preoperative cardiovascular risk assessment _____________   Date:  05/03/2024   Patient ID:  [name] [name], [name] [name] 05/12/1933, MRN [id number] Patient Location:  Home Provider location:   Office  Primary Care Provider:  Joshua Debby CROME, MD Primary Cardiologist:  Maude Emmer, MD  Chief Complaint / Patient Profile   89 y.o. y/o male with a h/o CVA, CAD, HTN, HLD who is pending lumbar epidural and presents today for telephonic preoperative cardiovascular risk assessment.  History of Present Illness    [name] is a 89 y.o. male who presents via audio/video conferencing for a telehealth visit today.  Pt was last seen in cardiology clinic on 12/07/2023 by Dr. Emmer.  At that time [name] [name] was doing well, negative stress testing in May 2025.  The patient is now pending procedure as outlined above. Since his last visit, he denies chest pain, palpitations, dyspnea, orthopnea, n, v,  dark/tarry/bloody stools, hematuria, dizziness, syncope, edema, weight gain. He has no difficulties with performing ADLs, ambulating, taking stairs, grocery shopping.   Past Medical History    Past Medical History:  Diagnosis Date   Anxiety    CAD (coronary artery disease)    a. s/p stent to LAD 2008;  b. abnormal ETT 11/13 with VTach => LHC pLAD 30% ISR, mCFX 30%, dRCA 60-75% which had progressed from previous study in 2010 =>  FFR of RCA 0.92 => med Rx    Carotid stenosis    dopplers 1/14:  0-39% bilateral ICA stenosis   Colon polyps    Colon polyps    adenomatous   Diverticular disease of colon    Hemorrhoids    external and internal   History of BPH    HTN (hypertension)    Hx of echocardiogram    a. Echo 9/11:  EF 55-60%, normal motion, grade 2 diastolic dysfunction, mild MR, mild LAE, mild RAE, PASP 33.   Hyperlipidemia    IBS (irritable bowel syndrome)    Rhinitis    Stroke, lacunar (HCC) 08/24/2012   Old, 2011 head mri   Past Surgical History:  Procedure Laterality Date   APPENDECTOMY  05/17/1954   CORONARY STENT INTERVENTION N/A 06/15/2022   Procedure: CORONARY STENT INTERVENTION;  Surgeon: Dann Candyce RAMAN, MD;  Location: [facility] INVASIVE CV LAB;  Service: Cardiovascular;  Laterality: N/A;   CORONARY STENT PLACEMENT  05/17/2006    Successful PCI of the lesion in the proximal LAD using a Promus    HERNIA REPAIR Right 05/18/2003   LEFT HEART CATH AND CORONARY ANGIOGRAPHY N/A 06/15/2022   Procedure: LEFT HEART CATH AND CORONARY ANGIOGRAPHY;  Surgeon: Dann Candyce RAMAN, MD;  Location: [facility] INVASIVE CV LAB;  Service: Cardiovascular;  Laterality: N/A;   LEFT HEART CATH AND CORONARY ANGIOGRAPHY N/A 03/10/2023   Procedure: LEFT HEART CATH AND CORONARY ANGIOGRAPHY;  Surgeon: Wonda Sharper, MD;  Location: [facility] INVASIVE CV LAB;  Service: Cardiovascular;  Laterality:  N/A;   NASAL SINUS SURGERY  05/17/1973   PERCUTANEOUS CORONARY STENT INTERVENTION (PCI-S) N/A 05/03/2012   Procedure: PERCUTANEOUS CORONARY STENT INTERVENTION (PCI-S);  Surgeon: Ozell Fell, MD;  Location: [facility] CATH LAB;  Service: Cardiovascular;  Laterality: N/A;   Promus DES ok for 3T MRI  05/17/2006    Allergies  Allergies[1]  Home Medications    Prior to Admission medications  Medication Sig Start Date End Date Taking? Authorizing Provider  ALPRAZolam  (XANAX ) 0.25 MG tablet Take 1 tablet (0.25 mg total) by mouth daily as needed for  anxiety (Take 1-2 tablets 30 minutes prior to MRI). 03/01/24   Henson, Vickie L, NP-C  Cholecalciferol (VITAMIN D3) 50 MCG (2000 UT) capsule Take 2,000 Units by mouth daily.    [provider]  clopidogrel  (PLAVIX ) 75 MG tablet Take 1 tablet (75 mg total) by mouth daily. 09/05/23   Delford Maude BROCKS, MD  fidaxomicin  (DIFICID ) 200 MG TABS tablet Take 1 tablet (200 mg total) by mouth 2 (two) times daily for 10 days. 04/27/24 05/07/24  Alvia Corean CROME, FNP  hydrocortisone 2.5 % cream SMARTSIG:sparingly Topical Twice Daily 03/31/24   [provider]  isosorbide  mononitrate (IMDUR ) 30 MG 24 hr tablet Take 0.5 tablets (15 mg total) by mouth daily. 10/03/23   Delford Maude BROCKS, MD  lidocaine  (LIDODERM ) 5 % Place 1 patch onto the skin daily. Remove & Discard patch within 12 hours or as directed by MD 03/01/24   Lendia Boby CROME, NP-C  lisinopril  (ZESTRIL ) 10 MG tablet TAKE 1 TABLET DAILY 03/21/24   Nishan, Peter C, MD  metoprolol  succinate (TOPROL -XL) 25 MG 24 hr tablet Take 1 tablet (25 mg total) by mouth daily. 12/07/23   Nishan, Peter C, MD  nitroGLYCERIN  (NITROSTAT ) 0.4 MG SL tablet Place 1 tablet (0.4 mg total) under the tongue every 5 (five) minutes as needed for chest pain. 10/03/23   Nishan, Peter C, MD  Polyethyl Glycol-Propyl Glycol (SYSTANE ULTRA) 0.4-0.3 % SOLN Place 1 drop into both eyes at bedtime.    [provider]  psyllium (METAMUCIL) 58.6 % packet Take 1 packet by mouth daily. Patient taking differently: Take 1 packet by mouth daily. 2 tbsp a day    [provider]  rosuvastatin  (CRESTOR ) 40 MG tablet Take 1 tablet (40 mg total) by mouth daily. 07/27/23   Delford Maude BROCKS, MD  tamsulosin  (FLOMAX ) 0.4 MG CAPS capsule Take 0.8 mg by mouth every morning.    [provider]  Vibegron  (GEMTESA ) 75 MG TABS Take 1 tablet (75 mg total) by mouth daily. 04/23/24   Stoneking, Adine PARAS., MD  zolpidem  (AMBIEN ) 10 MG tablet Take 1 tablet (10 mg total) by mouth at  bedtime as needed for sleep. 02/09/24   Joshua Debby CROME, MD    Physical Exam    Vital Signs:  Debby BROCKS Agent does not have vital signs available for review today.  Given telephonic nature of communication, physical exam is limited. AAOx3. NAD. Normal affect.  Speech and respirations are unlabored.  Accessory Clinical Findings    None  Assessment & Plan    1.  Preoperative Cardiovascular Risk Assessment: According to the Revised Cardiac Risk Index (RCRI), his Perioperative Risk of Major Cardiac Event is (%): 0.9  His Functional Capacity in METs is: 5.07 according to the Duke Activity Status Index (DASI). Therefore, based on ACC/AHA guidelines, patient would be at acceptable risk for the planned procedure without further cardiovascular testing.   The patient was advised that if  he develops new symptoms prior to surgery to contact our office to arrange for a follow-up visit, and he verbalized understanding.   Per office protocol, if patient is without any new symptoms or concerns at the time of their virtual visit, he may hold Plavix  for 5 days prior to procedure. Please resume Plavix  as soon as possible postprocedure, at the discretion of the surgeon.   A copy of this note will be routed to requesting surgeon.  Time:   Today, I have spent 10 minutes with the patient with telehealth technology discussing medical history, symptoms, and management plan.     Reya Aurich E Kendryck Lacroix, NP  05/03/2024, 10:31 AM     [1]  Allergies Allergen Reactions   Bactrim  [Sulfamethoxazole -Trimethoprim ]     Confusion, dizziness

## 2024-05-04 ENCOUNTER — Other Ambulatory Visit: Payer: Self-pay | Admitting: Family Medicine

## 2024-05-04 ENCOUNTER — Other Ambulatory Visit: Payer: Self-pay | Admitting: Internal Medicine

## 2024-05-04 DIAGNOSIS — F5104 Psychophysiologic insomnia: Secondary | ICD-10-CM

## 2024-05-04 DIAGNOSIS — F411 Generalized anxiety disorder: Secondary | ICD-10-CM

## 2024-05-23 NOTE — Progress Notes (Signed)
 Patient ID: [name] [name], male   DOB: [birth year], 89 y.o.   MRN: [id number]     89 y.o. history of CAD. Stent to LAD in 2008.  ETT in 2013 abnormal with NSVT. Cath 05/01/12 30% ISR LaD and distal RCA 60-75% with FFR CT .92 Rx medically Myovue done 04/27/17 reviewed and normal with no ischemia study not gated due to PAC;s EF normal echo 06/25/15 55-60% LA only mildly dilated Had another myovue 04/22/20 which was normal with no ischemia EF estimtated 53%  Has Occular migraines Been going on for over 3-4 years  Seen by neurology And MRI/MRA no abnormality and EEG no epileptic foci  01/11/14  1-39% bilateral carotid disease   Has lower lumbar disc issue and bilateral hip pain Has had repeat injection by Dr Claudene Also with right [facility] joint arthritis most recent injection 09/06/21 Has had Platelet Rich Plasma injections for his hips as well   03/2020 Went to [place] to visit his cousin [name] who is a famous Horticulturist, Commercial in [place] has a hypochondriac for husband They moved onto my [address] at [facility] last year  Daughter in [place] not working as PA but he likes her husband better Son works with him and got married for first time at age 57 to pharmacist at [employer]  Had a syncopal episode July  4th. 2022 Started with visual changes then BP low  80's with pre syncope  Felt weak for a couple hours. Had two drinks prior Not very hot out Occurred around 6 o'clock No  Chest pain, dyspnea Went to sleep and felt fine with no recurrence   Monitor: 12/29/20   showed no PAF but 8% burden atrial ectopy with self limited runs of SVT   He is building a [employer] on [address] Then will do one with a [place] on [address]   06/11/22:   had ? Angina. Tight pain in shoulders and back of neck with exertion that dissipates with rest Reproducible with similar degrees of stress No rest pain Also has had lower BP and we told him to hold his Zestril    Discussed prudence of heart cath given known distant history of  CAD/Stent and moderate residual Dx over 10 years ago  Cath done . 06/15/22 Dr Dann patent stent in LAD 75% PLB stenosis with new stent placed EF was normal  Aggravated by red tape in starting [employer] and [employer]. Has a new place in [place]  03/08/23 having more body aches and chest pain Was in [place] walking his dog and had exertional shoulder and neck pain. Similar to pain before RCA stent He did not take nitroglycerin  for it. No rest pain We both felt repeat heart cath indicated  Cath done 03/11/23 Dr Wonda. Patient PLB/LAD stents Tightest lesion only 40% distal LAD  Called office 10/03/23 concern with chest pains Pain is a bit atypical being base of neck and across shoulders. Not always exertional He has a lot of building projects going on but stress level manageable. He was encouraged to get new nitroglycerin  and see if it helps Myovue done 10/11/23 with no ischemia EF 70%   Seen in ED 11/18/23 with COVID Had malaise and fatigue Rx with Paxlovid   BP running a bit low Discussed decreasing Toprol  to 25 mg daily He has saltines in his car if needed. He has lost some weight  Needs lumbar epidural injection. Ok to hold plavix  for 5 days This is to be  done by [facility] IR imaging He has decided he does not need it now  Was in [place] for Thanksgiving and wife fell and broke her hip. Doing better now after rehab  ROS: Denies fever, malais, weight loss, blurry vision, decreased visual acuity, cough, sputum, SOB, hemoptysis, pleuritic pain, palpitaitons, heartburn, abdominal pain, melena, lower extremity edema, claudication, or rash.  All other systems reviewed and negative  General: There were no vitals taken for this visit. Affect appropriate Healthy:  appears stated age HEENT: normal Neck supple with no adenopathy JVP normal no bruits no thyromegaly Lungs clear with no wheezing and good diaphragmatic motion Heart:  S1/S2 no murmur, no rub, gallop or click PMI  normal Abdomen: benighn, BS positve, no tenderness, no AAA no bruit.  No HSM or HJR Distal pulses intact with no bruits No edema Neuro non-focal Skin warm and dry No muscular weakness     Current Outpatient Medications  Medication Sig Dispense Refill   ALPRAZolam  (XANAX ) 0.25 MG tablet TAKE ONE TABLET BY MOUTH DAILY AS NEEDED FOR ANXIETY( TAKE ONE TO TWO TABLETS 30 MINUTES PRIOR TO MRI) 20 tablet 2   Cholecalciferol (VITAMIN D3) 50 MCG (2000 UT) capsule Take 2,000 Units by mouth daily.     clopidogrel  (PLAVIX ) 75 MG tablet Take 1 tablet (75 mg total) by mouth daily. 90 tablet 3   hydrocortisone 2.5 % cream SMARTSIG:sparingly Topical Twice Daily     isosorbide  mononitrate (IMDUR ) 30 MG 24 hr tablet Take 0.5 tablets (15 mg total) by mouth daily. 45 tablet 3   lidocaine  (LIDODERM ) 5 % Place 1 patch onto the skin daily. Remove & Discard patch within 12 hours or as directed by MD 30 patch 0   lisinopril  (ZESTRIL ) 10 MG tablet TAKE 1 TABLET DAILY 90 tablet 2   metoprolol  succinate (TOPROL -XL) 25 MG 24 hr tablet Take 1 tablet (25 mg total) by mouth daily. 90 tablet 3   nitroGLYCERIN  (NITROSTAT ) 0.4 MG SL tablet Place 1 tablet (0.4 mg total) under the tongue every 5 (five) minutes as needed for chest pain. 25 tablet 3   Polyethyl Glycol-Propyl Glycol (SYSTANE ULTRA) 0.4-0.3 % SOLN Place 1 drop into both eyes at bedtime.     psyllium (METAMUCIL) 58.6 % packet Take 1 packet by mouth daily. (Patient taking differently: Take 1 packet by mouth daily. 2 tbsp a day)     rosuvastatin  (CRESTOR ) 40 MG tablet Take 1 tablet (40 mg total) by mouth daily. 90 tablet 3   tamsulosin  (FLOMAX ) 0.4 MG CAPS capsule Take 0.8 mg by mouth every morning.     Vibegron  (GEMTESA ) 75 MG TABS Take 1 tablet (75 mg total) by mouth daily.     zolpidem  (AMBIEN ) 10 MG tablet TAKE 1 TABLET(10 MG) BY MOUTH AT BEDTIME AS NEEDED FOR SLEEP. 90 tablet 0   No current facility-administered medications for this visit.     Allergies  Bactrim  [sulfamethoxazole -trimethoprim ]  Electrocardiogram:  05/24/2024  SR rate 70 RBBB  no changes PACls   Assessment and Plan  CAD: LAD stent in 2008 06/15/22 new stent to PLB of RCA Continue DAT Cath 03/11/23 patent stents only distal LAD 40% Shoulder/neck pain. PRN nitroglycerin . EX Myovue normal 10/11/23 EF 70% no ischemia  HLD : on generic crestor  now  at goal Lab Results  Component Value Date   LDLCALC 51 11/29/2023    GERD:with constipation not helped with Miralax, ducolox or Linzess f/u Dr Abran Rucks:  Chronic on ambien  f/u Dr Joshua  Visual Disturbance:  MRI/MRA normal EEG normal has been seen by neurology ? Occular migraines   Ortho:  f/u Dr Claudene for bilateral hip pain post injections MRI L3,4 disc bulge as well as right AC joint arthritis   Syncope:   Normal myovue 04/22/20 Labs ok Monitor with mostly atrial ectopy continue beta blocker BP lower now again ? Related to CAD Weight stable not postural D/c Zestril  Decrease Torpol to 25 mg F/U primary regarding his weight loss    COVID:  positive 11/18/23 Rx Paxlovid  No CXR done   Preoperative:  ok to hold plavix  5 days for lumbar epidural injection   F/U  6 months   Maude Emmer

## 2024-05-24 ENCOUNTER — Ambulatory Visit: Attending: Cardiovascular Disease | Admitting: Cardiovascular Disease

## 2024-05-24 VITALS — BP 120/65 | HR 64 | Ht 69.5 in | Wt 142.0 lb

## 2024-05-24 DIAGNOSIS — Z955 Presence of coronary angioplasty implant and graft: Secondary | ICD-10-CM | POA: Insufficient documentation

## 2024-05-24 DIAGNOSIS — I10 Essential (primary) hypertension: Secondary | ICD-10-CM | POA: Insufficient documentation

## 2024-05-24 DIAGNOSIS — E782 Mixed hyperlipidemia: Secondary | ICD-10-CM | POA: Diagnosis present

## 2024-05-24 NOTE — Patient Instructions (Signed)
 Medication Instructions:  No Changes *If you need a refill on your cardiac medications before your next appointment, please call your pharmacy*  Lab Work: None  Follow-Up: At [facility], you and your health needs are our priority.  As part of our continuing mission to provide you with exceptional heart care, our providers are all part of one team.  This team includes your primary Cardiologist (physician) and Advanced Practice Providers or APPs (Physician Assistants and Nurse Practitioners) who all work together to provide you with the care you need, when you need it.  Your next appointment:   6 month(s) (Call in April for a July 2026 appointment)  Provider:   Maude Emmer, MD

## 2024-05-31 ENCOUNTER — Ambulatory Visit: Admitting: Urology

## 2024-05-31 ENCOUNTER — Encounter: Payer: Self-pay | Admitting: Urology

## 2024-05-31 VITALS — BP 131/70 | HR 76 | Ht 69.0 in | Wt 138.0 lb

## 2024-05-31 DIAGNOSIS — N401 Enlarged prostate with lower urinary tract symptoms: Secondary | ICD-10-CM

## 2024-05-31 DIAGNOSIS — R3129 Other microscopic hematuria: Secondary | ICD-10-CM

## 2024-05-31 DIAGNOSIS — R351 Nocturia: Secondary | ICD-10-CM

## 2024-05-31 LAB — URINALYSIS, ROUTINE W REFLEX MICROSCOPIC
Bilirubin, UA: NEGATIVE
Glucose, UA: NEGATIVE
Ketones, UA: NEGATIVE
Leukocytes,UA: NEGATIVE
Nitrite, UA: NEGATIVE
Protein,UA: NEGATIVE
Specific Gravity, UA: 1.01 (ref 1.005–1.030)
Urobilinogen, Ur: 0.2 mg/dL (ref 0.2–1.0)
pH, UA: 5.5 (ref 5.0–7.5)

## 2024-05-31 LAB — MICROSCOPIC EXAMINATION: Bacteria, UA: NONE SEEN

## 2024-05-31 MED ORDER — CIPROFLOXACIN HCL 500 MG PO TABS
500.0000 mg | ORAL_TABLET | Freq: Once | ORAL | Status: AC
  Administered 2024-05-31: 500 mg via ORAL

## 2024-05-31 MED ORDER — ALFUZOSIN HCL ER 10 MG PO TB24: 10.0000 mg | ORAL_TABLET | Freq: Every day | ORAL | 11 refills | Status: AC

## 2024-05-31 NOTE — Progress Notes (Signed)
 "  Assessment: 1. Microscopic hematuria   2. BPH with obstruction/lower urinary tract symptoms   3. Nocturia     Plan: Cipro  x 1 following cystoscopy Cystoscopy results discussed with the patient. He does have some cystoscopic findings to suggest bladder outlet obstruction.  I discussed that this could be contributing to his lower urinary tract symptoms although his primary symptoms are more irritative than obstructive.  I discussed options for management including alternative medical therapy and surgical therapy.  Given his prostate volume, he is not a good candidate for minimally invasive procedures.  I discussed TURP, HoLEP, and aqua ablation. Trial of alfuzosin  10 mg daily in place of tamsulosin .  Prescription sent. Return to this in 6 weeks.  Chief Complaint:  Chief Complaint  Patient presents with   Hematuria    History of Present Illness:  [name] [name] is a 89 y.o. male who is seen for further evaluation of microscopic hematuria, BPH with lower urinary tract symptoms and right sided flank pain. He has previously been followed by Dr. Gaston with [facility] and was last seen in September 2025.  He has been managed with tamsulosin  0.8 mg.  He has also been treated with PTNS. He has failed a number of OAB medications and has previously been given desmopressin.  At his last visit in September 2025, InterStim trial was discussed. He reported that his lower urinary tract symptoms were relatively stable.  He continued with some frequency and nocturia 3-4 times.  No dysuria or incontinence.  He reported some discomfort in the right groin area with urination.  He also had a 37-month history of some intermittent right flank and back pain. No history of kidney stones.  CT renal stone study from 02/06/2024 showed a simple right renal cyst, no renal or ureteral calculi, no obstruction, enlarged prostate with protrusion into the bladder base. Prostate volume calculated around 85  ml.  PSA 7/25: 1.20  PVR = 67 mL He was given a trial of Gemtesa  at his visit in 12/25.  He continued on tamsulosin . Urinalysis showed 11-30 RBCs.  He presents today for further evaluation with cystoscopy. He continues on tamsulosin .  He ran out of the Gemtesa  samples. He did not see a significant change in his urinary symptoms with the Gemtesa .  He continues to report symptoms of frequency and nocturia.  He does have some occasional discomfort in the right groin area with urination.  No dysuria, gross hematuria, or incontinence.  Portions of the above documentation were copied from a prior visit for review purposes only.   Past Medical History:  Past Medical History:  Diagnosis Date   Anxiety    CAD (coronary artery disease)    a. s/p stent to LAD 2008;  b. abnormal ETT 11/13 with VTach => LHC pLAD 30% ISR, mCFX 30%, dRCA 60-75% which had progressed from previous study in 2010 => FFR of RCA 0.92 => med Rx    Carotid stenosis    dopplers 1/14:  0-39% bilateral ICA stenosis   Colon polyps    Colon polyps    adenomatous   Diverticular disease of colon    Hemorrhoids    external and internal   History of BPH    HTN (hypertension)    Hx of echocardiogram    a. Echo 9/11:  EF 55-60%, normal motion, grade 2 diastolic dysfunction, mild MR, mild LAE, mild RAE, PASP 33.   Hyperlipidemia    IBS (irritable bowel syndrome)    Rhinitis  Stroke, lacunar (HCC) 08/24/2012   Old, 2011 head mri    Past Surgical History:  Past Surgical History:  Procedure Laterality Date   APPENDECTOMY  05/17/1954   CORONARY STENT INTERVENTION N/A 06/15/2022   Procedure: CORONARY STENT INTERVENTION;  Surgeon: Dann Candyce RAMAN, MD;  Location: [facility] INVASIVE CV LAB;  Service: Cardiovascular;  Laterality: N/A;   CORONARY STENT PLACEMENT  05/17/2006    Successful PCI of the lesion in the proximal LAD using a Promus    HERNIA REPAIR Right 05/18/2003   LEFT HEART CATH AND CORONARY ANGIOGRAPHY N/A 06/15/2022    Procedure: LEFT HEART CATH AND CORONARY ANGIOGRAPHY;  Surgeon: Dann Candyce RAMAN, MD;  Location[facility] INVASIVE CV LAB;  Service: Cardiovascular;  Laterality: N/A;   LEFT HEART CATH AND CORONARY ANGIOGRAPHY N/A 03/10/2023   Procedure: LEFT HEART CATH AND CORONARY ANGIOGRAPHY;  Surgeon: Wonda Sharper, MD;  Location: [facility] INVASIVE CV LAB;  Service: Cardiovascular;  Laterality: N/A;   NASAL SINUS SURGERY  05/17/1973   PERCUTANEOUS CORONARY STENT INTERVENTION (PCI-S) N/A 05/03/2012   Procedure: PERCUTANEOUS CORONARY STENT INTERVENTION (PCI-S);  Surgeon: Sharper Wonda, MD;  Location: [facility] CATH LAB;  Service: Cardiovascular;  Laterality: N/A;   Promus DES ok for 3T MRI  05/17/2006    Allergies:  Allergies  Allergen Reactions   Bactrim  [Sulfamethoxazole -Trimethoprim ]     Confusion, dizziness    Family History:  Family History  Problem Relation Age of Onset   Rheum arthritis Mother    Coronary artery disease Father    Heart attack Father    Cancer Brother        ? TYPE   Colon cancer Neg Hx    Stomach cancer Neg Hx    Pancreatic cancer Neg Hx     Social History:  Social History   Tobacco Use   Smoking status: Never    Passive exposure: Never   Smokeless tobacco: Never  Vaping Use   Vaping status: Never Used  Substance Use Topics   Alcohol use: Yes    Alcohol/week: 1.0 standard drink of alcohol    Types: 1 Shots of liquor per week    Comment: 3 per week   Drug use: No    ROS: Constitutional:  Negative for fever, chills, weight loss CV: Negative for chest pain, previous MI, hypertension Respiratory:  Negative for shortness of breath, wheezing, sleep apnea, frequent cough GI:  Negative for nausea, vomiting, bloody stool, GERD  Physical exam: BP 131/70   Pulse 76   Ht 5' 9 (1.753 m)   Wt 138 lb (62.6 kg)   BMI 20.38 kg/m  GENERAL APPEARANCE:  Well appearing, well developed, well nourished, NAD HEENT:  Atraumatic, normocephalic, oropharynx clear NECK:  Supple  without lymphadenopathy or thyromegaly ABDOMEN:  Soft, non-tender, no masses EXTREMITIES:  Moves all extremities well, without clubbing, cyanosis, or edema NEUROLOGIC:  Alert and oriented x 3, normal gait, CN II-XII grossly intact MENTAL STATUS:  appropriate BACK:  Non-tender to palpation, No CVAT SKIN:  Warm, dry, and intact   Results: U/A:0-5 WBCs, 3-10 RBCs  Procedure:  Flexible Cystourethroscopy  Pre-operative Diagnosis: Microscopic hematuria, LUTS  Post-operative Diagnosis: Microscopic hematuria, LUTS  Anesthesia:  local with lidocaine  jelly  Surgical Narrative:  After appropriate informed consent was obtained, the patient was prepped and draped in the usual sterile fashion in the supine position.  The patient was correctly identified and the proper procedure delineated prior to proceeding.  Sterile lidocaine  gel was instilled in the urethra. The flexible cystoscope was  introduced without difficulty.  Findings:  Anterior urethra: Normal  Posterior urethra: Lateral lobe hypertrophy, no significant median lobe  Bladder: trabeculations and cellules; no papillary lesions seen  Ureteral orifices: normal  Additional findings: none  Saline bladder wash for cytology was not performed.    The cystoscope was then removed.  The patient tolerated the procedure well.  "

## 2024-06-06 ENCOUNTER — Other Ambulatory Visit: Admitting: Urology

## 2024-06-08 NOTE — Progress Notes (Unsigned)
 " [name] [address] [place] [address] Phone: [phone] Subjective:    I'm seeing this patient by the request  of:  Joshua Debby CROME, MD  CC:   YEP:Dlagzrupcz  04/26/2024 I believe the patient is having more neurogenic claudication secondary to more of the spinal stenosis noted on the L4-L5 area with patient having the radicular symptoms in the buttocks area.  We discussed with patient that an L4-L5 epidural could be potentially beneficial with diagnostic as well as therapeutic.  Would like to avoid too many medications if possible.  Discussed icing regimen and home exercises.  Follow-up again 6 weeks after the injection.     Updated 06/11/2024 [name] [name] is a 89 y.o. male coming in with complaint of back pain       Past Medical History:  Diagnosis Date   Anxiety    CAD (coronary artery disease)    a. s/p stent to LAD 2008;  b. abnormal ETT 11/13 with VTach => LHC pLAD 30% ISR, mCFX 30%, dRCA 60-75% which had progressed from previous study in 2010 => FFR of RCA 0.92 => med Rx    Carotid stenosis    dopplers 1/14:  0-39% bilateral ICA stenosis   Colon polyps    Colon polyps    adenomatous   Diverticular disease of colon    Hemorrhoids    external and internal   History of BPH    HTN (hypertension)    Hx of echocardiogram    a. Echo 9/11:  EF 55-60%, normal motion, grade 2 diastolic dysfunction, mild MR, mild LAE, mild RAE, PASP 33.   Hyperlipidemia    IBS (irritable bowel syndrome)    Rhinitis    Stroke, lacunar (HCC) 08/24/2012   Old, 2011 head mri   Past Surgical History:  Procedure Laterality Date   APPENDECTOMY  05/17/1954   CORONARY STENT INTERVENTION N/A 06/15/2022   Procedure: CORONARY STENT INTERVENTION;  Surgeon: Dann Candyce RAMAN, MD;  Location: [facility] INVASIVE CV LAB;  Service: Cardiovascular;  Laterality: N/A;   CORONARY STENT PLACEMENT  05/17/2006    Successful PCI of the lesion in the proximal LAD using a  Promus    HERNIA REPAIR Right 05/18/2003   LEFT HEART CATH AND CORONARY ANGIOGRAPHY N/A 06/15/2022   Procedure: LEFT HEART CATH AND CORONARY ANGIOGRAPHY;  Surgeon: Dann Candyce RAMAN, MD;  Location: [facility] INVASIVE CV LAB;  Service: Cardiovascular;  Laterality: N/A;   LEFT HEART CATH AND CORONARY ANGIOGRAPHY N/A 03/10/2023   Procedure: LEFT HEART CATH AND CORONARY ANGIOGRAPHY;  Surgeon: Wonda Sharper, MD;  Location: [facility] INVASIVE CV LAB;  Service: Cardiovascular;  Laterality: N/A;   NASAL SINUS SURGERY  05/17/1973   PERCUTANEOUS CORONARY STENT INTERVENTION (PCI-S) N/A 05/03/2012   Procedure: PERCUTANEOUS CORONARY STENT INTERVENTION (PCI-S);  Surgeon: Sharper Wonda, MD;  Location: [facility] CATH LAB;  Service: Cardiovascular;  Laterality: N/A;   Promus DES ok for 3T MRI  05/17/2006   Social History   Socioeconomic History   Marital status: Married    Spouse name: Not on file   Number of children: 3   Years of education: 16   Highest education level: Bachelor's degree (e.g., BA, AB, BS)  Occupational History   Occupation: businessman, Financial Planner: RETIRED  Tobacco Use   Smoking status: Never    Passive exposure: Never   Smokeless tobacco: Never  Vaping Use   Vaping status: Never Used  Substance and Sexual Activity  Alcohol use: Yes    Alcohol/week: 1.0 standard drink of alcohol    Types: 1 Shots of liquor per week    Comment: 3 per week   Drug use: No   Sexual activity: Yes    Partners: Female  Other Topics Concern   Not on file  Social History Narrative   HSG, Engineer, 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 He has two daughters, 1 in [place] 1 in [place] (Dec '12) and a son who works with him. Several grandchildren.Reports that he spends a good deal of time at his home in [facility] which he greatly enjoys and his home in the [place]. Enjoys driving his BMW convertible when in  the mountains.      ACP - Yes CPR, yes for short-term mechanical ventilation for reversible disease. Recommended [email or web address] for consideration.   Social Drivers of Health   Tobacco Use: Low Risk (05/31/2024)   Patient History    Smoking Tobacco Use: Never    Smokeless Tobacco Use: Never    Passive Exposure: Never  Financial Resource Strain: Low Risk (03/01/2024)   Overall Financial Resource Strain (CARDIA)    Difficulty of Paying Living Expenses: Not hard at all  Food Insecurity: No Food Insecurity (03/01/2024)   Epic    Worried About Programme Researcher, Broadcasting/film/video in the Last Year: Never true    Ran Out of Food in the Last Year: Never true  Transportation Needs: No Transportation Needs (03/01/2024)   Epic    Lack of Transportation (Medical): No    Lack of Transportation (Non-Medical): No  Physical Activity: Insufficiently Active (03/01/2024)   Exercise Vital Sign    Days of Exercise per Week: 3 days    Minutes of Exercise per Session: 40 min  Stress: No Stress Concern Present (03/01/2024)   [name] of Occupational Health - Occupational Stress Questionnaire    Feeling of Stress: Not at all  Social Connections: Unknown (03/01/2024)   Social Connection and Isolation Panel    Frequency of Communication with Friends and Family: Three times a week    Frequency of Social Gatherings with Friends and Family: Three times a week    Attends Religious Services: More than 4 times per year    Active Member of Clubs or Organizations: Not on file    Attends Club or Organization Meetings: Not on file    Marital Status: Married  Depression [phone]): Low Risk (02/01/2024)   Depression (PHQ2-9)    PHQ-2 Score: 2  Alcohol Screen: Low Risk (03/01/2024)   Alcohol Screen    Last Alcohol Screening Score (AUDIT): 3  Housing: Unknown (03/01/2024)   Epic    Unable to Pay for Housing in the Last Year: No    Number of Times Moved in the Last Year: Not on file    Homeless in the Last  Year: Not on file  Utilities: Not At Risk (11/21/2023)   Epic    Threatened with loss of utilities: No  Health Literacy: Adequate Health Literacy (11/21/2023)   B1300 Health Literacy    Frequency of need for help with medical instructions: Never   Allergies[1] Family History  Problem Relation Age of Onset   Rheum arthritis Mother    Coronary artery disease Father    Heart attack Father    Cancer Brother        ? TYPE   Colon cancer Neg Hx    Stomach cancer  Neg Hx    Pancreatic cancer Neg Hx     Current Outpatient Medications (Cardiovascular):    isosorbide  mononitrate (IMDUR ) 30 MG 24 hr tablet, Take 0.5 tablets (15 mg total) by mouth daily.   lisinopril  (ZESTRIL ) 10 MG tablet, TAKE 1 TABLET DAILY   metoprolol  succinate (TOPROL -XL) 25 MG 24 hr tablet, Take 1 tablet (25 mg total) by mouth daily.   nitroGLYCERIN  (NITROSTAT ) 0.4 MG SL tablet, Place 1 tablet (0.4 mg total) under the tongue every 5 (five) minutes as needed for chest pain.   rosuvastatin  (CRESTOR ) 40 MG tablet, Take 1 tablet (40 mg total) by mouth daily.  Current Outpatient Medications (Hematological):    clopidogrel  (PLAVIX ) 75 MG tablet, Take 1 tablet (75 mg total) by mouth daily.  Current Outpatient Medications (Other):    alfuzosin  (UROXATRAL ) 10 MG 24 hr tablet, Take 1 tablet (10 mg total) by mouth daily.   ALPRAZolam  (XANAX ) 0.25 MG tablet, TAKE ONE TABLET BY MOUTH DAILY AS NEEDED FOR ANXIETY( TAKE ONE TO TWO TABLETS 30 MINUTES PRIOR TO MRI)   Cholecalciferol (VITAMIN D3) 50 MCG (2000 UT) capsule, Take 2,000 Units by mouth daily.   hydrocortisone 2.5 % cream, SMARTSIG:sparingly Topical Twice Daily   lidocaine  (LIDODERM ) 5 %, Place 1 patch onto the skin daily. Remove & Discard patch within 12 hours or as directed by MD   Polyethyl Glycol-Propyl Glycol (SYSTANE ULTRA) 0.4-0.3 % SOLN, Place 1 drop into both eyes at bedtime.   psyllium (METAMUCIL) 58.6 % packet, Take 1 packet by mouth daily. (Patient taking differently:  Take 1 packet by mouth daily. 2 tbsp a day)   tamsulosin  (FLOMAX ) 0.4 MG CAPS capsule, Take 0.8 mg by mouth every morning.   triamcinolone  cream (KENALOG ) 0.1 %, as needed (rash).   Vibegron  (GEMTESA ) 75 MG TABS, Take 1 tablet (75 mg total) by mouth daily.   zolpidem  (AMBIEN ) 10 MG tablet, TAKE 1 TABLET(10 MG) BY MOUTH AT BEDTIME AS NEEDED FOR SLEEP.   Reviewed prior external information including notes and imaging from  primary care provider As well as notes that were available from care everywhere and other healthcare systems.  Past medical history, social, surgical and family history all reviewed in electronic medical record.  No pertanent information unless stated regarding to the chief complaint.   Review of Systems:  No headache, visual changes, nausea, vomiting, diarrhea, constipation, dizziness, abdominal pain, skin rash, fevers, chills, night sweats, weight loss, swollen lymph nodes, body aches, joint swelling, chest pain, shortness of breath, mood changes. POSITIVE muscle aches  Objective  There were no vitals taken for this visit.   General: No apparent distress alert and oriented x3 mood and affect normal, dressed appropriately.  HEENT: Pupils equal, extraocular movements intact  Respiratory: Patient's speak in full sentences and does not appear short of breath  Cardiovascular: No lower extremity edema, non tender, no erythema      Impression and Recommendations:           [1]  Allergies Allergen Reactions   Bactrim  [Sulfamethoxazole -Trimethoprim ]     Confusion, dizziness   "

## 2024-06-11 ENCOUNTER — Ambulatory Visit: Admitting: Family Medicine

## 2024-06-15 ENCOUNTER — Ambulatory Visit

## 2024-06-15 ENCOUNTER — Ambulatory Visit: Admitting: Family Medicine

## 2024-06-15 VITALS — BP 106/62 | HR 77 | Ht 69.0 in | Wt 142.0 lb

## 2024-06-15 DIAGNOSIS — M25551 Pain in right hip: Secondary | ICD-10-CM | POA: Diagnosis not present

## 2024-06-15 DIAGNOSIS — M545 Low back pain, unspecified: Secondary | ICD-10-CM

## 2024-06-15 DIAGNOSIS — M25512 Pain in left shoulder: Secondary | ICD-10-CM | POA: Diagnosis not present

## 2024-06-15 DIAGNOSIS — M48062 Spinal stenosis, lumbar region with neurogenic claudication: Secondary | ICD-10-CM

## 2024-06-15 DIAGNOSIS — S42292A Other displaced fracture of upper end of left humerus, initial encounter for closed fracture: Secondary | ICD-10-CM | POA: Insufficient documentation

## 2024-06-15 MED ORDER — TRAMADOL HCL 50 MG PO TABS: 50.0000 mg | ORAL_TABLET | Freq: Three times a day (TID) | ORAL | 0 refills | Status: AC | PRN

## 2024-06-15 NOTE — Patient Instructions (Addendum)
 Sling or coat See me in 2 weeks Ok to double book: xray first

## 2024-06-15 NOTE — Progress Notes (Signed)
 " [name] [address] [place] [address] Phone: [phone] Subjective:    I'm seeing this patient by the request  of:  Joshua Debby CROME, MD  CC: Low back pain, left shoulder pain  YEP:Dlagzrupcz  [name] [name] is a 89 y.o. male coming in with complaint of lower back pain over the R side. Fell last night and his L scapula is also painful.     Past Medical History:  Diagnosis Date   Anxiety    CAD (coronary artery disease)    a. s/p stent to LAD 2008;  b. abnormal ETT 11/13 with VTach => LHC pLAD 30% ISR, mCFX 30%, dRCA 60-75% which had progressed from previous study in 2010 => FFR of RCA 0.92 => med Rx    Carotid stenosis    dopplers 1/14:  0-39% bilateral ICA stenosis   Colon polyps    Colon polyps    adenomatous   Diverticular disease of colon    Hemorrhoids    external and internal   History of BPH    HTN (hypertension)    Hx of echocardiogram    a. Echo 9/11:  EF 55-60%, normal motion, grade 2 diastolic dysfunction, mild MR, mild LAE, mild RAE, PASP 33.   Hyperlipidemia    IBS (irritable bowel syndrome)    Rhinitis    Stroke, lacunar (HCC) 08/24/2012   Old, 2011 head mri   Past Surgical History:  Procedure Laterality Date   APPENDECTOMY  05/17/1954   CORONARY STENT INTERVENTION N/A 06/15/2022   Procedure: CORONARY STENT INTERVENTION;  Surgeon: Dann Candyce RAMAN, MD;  Location: [facility] INVASIVE CV LAB;  Service: Cardiovascular;  Laterality: N/A;   CORONARY STENT PLACEMENT  05/17/2006    Successful PCI of the lesion in the proximal LAD using a Promus    HERNIA REPAIR Right 05/18/2003   LEFT HEART CATH AND CORONARY ANGIOGRAPHY N/A 06/15/2022   Procedure: LEFT HEART CATH AND CORONARY ANGIOGRAPHY;  Surgeon: Dann Candyce RAMAN, MD;  Location: [facility] INVASIVE CV LAB;  Service: Cardiovascular;  Laterality: N/A;   LEFT HEART CATH AND CORONARY ANGIOGRAPHY N/A 03/10/2023   Procedure: LEFT HEART CATH AND CORONARY ANGIOGRAPHY;  Surgeon:  Wonda Sharper, MD;  Location: [facility] INVASIVE CV LAB;  Service: Cardiovascular;  Laterality: N/A;   NASAL SINUS SURGERY  05/17/1973   PERCUTANEOUS CORONARY STENT INTERVENTION (PCI-S) N/A 05/03/2012   Procedure: PERCUTANEOUS CORONARY STENT INTERVENTION (PCI-S);  Surgeon: Sharper Wonda, MD;  Location: [facility] CATH LAB;  Service: Cardiovascular;  Laterality: N/A;   Promus DES ok for 3T MRI  05/17/2006   Social History   Socioeconomic History   Marital status: Married    Spouse name: Not on file   Number of children: 3   Years of education: 16   Highest education level: Bachelor's degree (e.g., BA, AB, BS)  Occupational History   Occupation: businessman, Financial Planner: RETIRED  Tobacco Use   Smoking status: Never    Passive exposure: Never   Smokeless tobacco: Never  Vaping Use   Vaping status: Never Used  Substance and Sexual Activity   Alcohol use: Yes    Alcohol/week: 1.0 standard drink of alcohol    Types: 1 Shots of liquor per week    Comment: 3 per week   Drug use: No   Sexual activity: Yes    Partners: Female  Other Topics Concern   Not on file  Social History Narrative   HSG, Engineer, 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 He has two daughters, 1 in [place] 1 in [place] (Dec '12) and a son who works with him. Several grandchildren.Reports that he spends a good deal of time at his home in [facility] which he greatly enjoys and his home in the [place]. Enjoys driving his BMW convertible when in the mountains.      ACP - Yes CPR, yes for short-term mechanical ventilation for reversible disease. Recommended [email or web address] for consideration.   Social Drivers of Health   Tobacco Use: Low Risk (05/31/2024)   Patient History    Smoking Tobacco Use: Never    Smokeless Tobacco Use: Never    Passive Exposure: Never  Financial Resource Strain: Low Risk (03/01/2024)    Overall Financial Resource Strain (CARDIA)    Difficulty of Paying Living Expenses: Not hard at all  Food Insecurity: No Food Insecurity (03/01/2024)   Epic    Worried About Programme Researcher, Broadcasting/film/video in the Last Year: Never true    Ran Out of Food in the Last Year: Never true  Transportation Needs: No Transportation Needs (03/01/2024)   Epic    Lack of Transportation (Medical): No    Lack of Transportation (Non-Medical): No  Physical Activity: Insufficiently Active (03/01/2024)   Exercise Vital Sign    Days of Exercise per Week: 3 days    Minutes of Exercise per Session: 40 min  Stress: No Stress Concern Present (03/01/2024)   [name] of Occupational Health - Occupational Stress Questionnaire    Feeling of Stress: Not at all  Social Connections: Unknown (03/01/2024)   Social Connection and Isolation Panel    Frequency of Communication with Friends and Family: Three times a week    Frequency of Social Gatherings with Friends and Family: Three times a week    Attends Religious Services: More than 4 times per year    Active Member of Clubs or Organizations: Not on file    Attends Club or Organization Meetings: Not on file    Marital Status: Married  Depression [phone]): Low Risk (02/01/2024)   Depression (PHQ2-9)    PHQ-2 Score: 2  Alcohol Screen: Low Risk (03/01/2024)   Alcohol Screen    Last Alcohol Screening Score (AUDIT): 3  Housing: Unknown (03/01/2024)   Epic    Unable to Pay for Housing in the Last Year: No    Number of Times Moved in the Last Year: Not on file    Homeless in the Last Year: Not on file  Utilities: Not At Risk (11/21/2023)   Epic    Threatened with loss of utilities: No  Health Literacy: Adequate Health Literacy (11/21/2023)   B1300 Health Literacy    Frequency of need for help with medical instructions: Never   Allergies[1] Family History  Problem Relation Age of Onset   Rheum arthritis Mother    Coronary artery disease Father    Heart attack Father     Cancer Brother        ? TYPE   Colon cancer Neg Hx    Stomach cancer Neg Hx    Pancreatic cancer Neg Hx     Current Outpatient Medications (Cardiovascular):    isosorbide  mononitrate (IMDUR ) 30 MG 24 hr tablet, Take 0.5 tablets (15 mg total) by mouth daily.   lisinopril  (ZESTRIL ) 10 MG tablet, TAKE 1 TABLET DAILY   metoprolol  succinate (TOPROL -XL) 25 MG 24 hr tablet, Take 1 tablet (25  mg total) by mouth daily.   nitroGLYCERIN  (NITROSTAT ) 0.4 MG SL tablet, Place 1 tablet (0.4 mg total) under the tongue every 5 (five) minutes as needed for chest pain.   rosuvastatin  (CRESTOR ) 40 MG tablet, Take 1 tablet (40 mg total) by mouth daily.  Current Outpatient Medications (Analgesics):    traMADol  (ULTRAM ) 50 MG tablet, Take 1 tablet (50 mg total) by mouth every 8 (eight) hours as needed for up to 5 days.  Current Outpatient Medications (Hematological):    clopidogrel  (PLAVIX ) 75 MG tablet, Take 1 tablet (75 mg total) by mouth daily.  Current Outpatient Medications (Other):    alfuzosin  (UROXATRAL ) 10 MG 24 hr tablet, Take 1 tablet (10 mg total) by mouth daily.   ALPRAZolam  (XANAX ) 0.25 MG tablet, TAKE ONE TABLET BY MOUTH DAILY AS NEEDED FOR ANXIETY( TAKE ONE TO TWO TABLETS 30 MINUTES PRIOR TO MRI)   Cholecalciferol (VITAMIN D3) 50 MCG (2000 UT) capsule, Take 2,000 Units by mouth daily.   hydrocortisone 2.5 % cream, SMARTSIG:sparingly Topical Twice Daily   lidocaine  (LIDODERM ) 5 %, Place 1 patch onto the skin daily. Remove & Discard patch within 12 hours or as directed by MD   Polyethyl Glycol-Propyl Glycol (SYSTANE ULTRA) 0.4-0.3 % SOLN, Place 1 drop into both eyes at bedtime.   psyllium (METAMUCIL) 58.6 % packet, Take 1 packet by mouth daily. (Patient taking differently: Take 1 packet by mouth daily. 2 tbsp a day)   tamsulosin  (FLOMAX ) 0.4 MG CAPS capsule, Take 0.8 mg by mouth every morning.   triamcinolone  cream (KENALOG ) 0.1 %, as needed (rash).   Vibegron  (GEMTESA ) 75 MG TABS, Take 1  tablet (75 mg total) by mouth daily.   zolpidem  (AMBIEN ) 10 MG tablet, TAKE 1 TABLET(10 MG) BY MOUTH AT BEDTIME AS NEEDED FOR SLEEP.   Reviewed prior external information including notes and imaging from  primary care provider As well as notes that were available from care everywhere and other healthcare systems.  Past medical history, social, surgical and family history all reviewed in electronic medical record.  No pertanent information unless stated regarding to the chief complaint.   Review of Systems:  No headache, visual changes, nausea, vomiting, diarrhea, constipation, dizziness, abdominal pain, skin rash, fevers, chills, night sweats, weight loss, swollen lymph nodes, body aches, joint swelling, chest pain, shortness of breath, mood changes. POSITIVE muscle aches  Objective  Blood pressure 106/62, pulse 77, height 5' 9 (1.753 m), weight 142 lb (64.4 kg), SpO2 98%.   General: No apparent distress alert and oriented x3 mood and affect normal, dressed appropriately.  HEENT: Pupils equal, extraocular movements intact  Respiratory: Patient's speak in full sentences and does not appear short of breath  Cardiovascular: No lower extremity edema, non tender, no erythema  Patient does have on initial exam and anterior subluxation of the shoulder.  Did have manipulation with an audible pop and patient noted improvement in range of motion but still weakness.  Severe tenderness to palpation over the humeral head.  Neurovascular intact distally.  Good grip strength.  Low back mild contusion noted on the soft tissue on the right side.  No midline tenderness.    Impression and Recommendations:      The above documentation has been reviewed and is accurate and complete Arthea CHRISTELLA Sharps, DO      [1]  Allergies Allergen Reactions   Bactrim  [Sulfamethoxazole -Trimethoprim ]     Confusion, dizziness   "

## 2024-06-15 NOTE — Assessment & Plan Note (Signed)
 History of spinal stenosis.  No midline tenderness.  Contusion on the right side.  No abdominal pain.  Will monitor.  I think patient will do well with the low back but unfortunately the shoulder will be the bigger concern.

## 2024-06-18 ENCOUNTER — Ambulatory Visit: Payer: Self-pay | Admitting: Family Medicine

## 2024-06-19 ENCOUNTER — Ambulatory Visit: Admitting: Family Medicine

## 2024-06-19 ENCOUNTER — Ambulatory Visit

## 2024-06-19 ENCOUNTER — Encounter: Payer: Self-pay | Admitting: Family Medicine

## 2024-06-19 VITALS — BP 126/62 | HR 75 | Ht 69.0 in | Wt 142.0 lb

## 2024-06-19 DIAGNOSIS — S42292A Other displaced fracture of upper end of left humerus, initial encounter for closed fracture: Secondary | ICD-10-CM | POA: Diagnosis not present

## 2024-06-19 DIAGNOSIS — M25512 Pain in left shoulder: Secondary | ICD-10-CM | POA: Diagnosis not present

## 2024-06-19 NOTE — Patient Instructions (Addendum)
 [facility]  When we receive your results we will contact you.

## 2024-06-19 NOTE — Progress Notes (Signed)
 " [name] [name] JEN[name] [address] [place] [address] Phone: [phone] Subjective:   [name] [name], am serving as a scribe for Dr. Arthea [name].  I'm seeing this patient by the request  of:  [name] Debby CROME, MD  CC: Worsening pain  YEP:Dlagzrupcz  [name] [name] is a 89 y.o. male coming in with complaint of arm pain.  He did have a what appeared to be a proximal humerus fracture. He was reaching up for towel and felt crunch in front of shoulder.       Past Medical History:  Diagnosis Date   Anxiety    CAD (coronary artery disease)    a. s/p stent to LAD 2008;  b. abnormal ETT 11/13 with VTach => LHC pLAD 30% ISR, mCFX 30%, dRCA 60-75% which had progressed from previous study in 2010 => FFR of RCA 0.92 => med Rx    Carotid stenosis    dopplers 1/14:  0-39% bilateral ICA stenosis   Colon polyps    Colon polyps    adenomatous   Diverticular disease of colon    Hemorrhoids    external and internal   History of BPH    HTN (hypertension)    Hx of echocardiogram    a. Echo 9/11:  EF 55-60%, normal motion, grade 2 diastolic dysfunction, mild MR, mild LAE, mild RAE, PASP 33.   Hyperlipidemia    IBS (irritable bowel syndrome)    Rhinitis    Stroke, lacunar (HCC) 08/24/2012   Old, 2011 head mri   Past Surgical History:  Procedure Laterality Date   APPENDECTOMY  05/17/1954   CORONARY STENT INTERVENTION N/A 06/15/2022   Procedure: CORONARY STENT INTERVENTION;  Surgeon: [name] Candyce RAMAN, MD;  Location: [facility] INVASIVE CV LAB;  Service: Cardiovascular;  Laterality: N/A;   CORONARY STENT PLACEMENT  05/17/2006    Successful PCI of the lesion in the proximal LAD using a Promus    HERNIA REPAIR Right 05/18/2003   LEFT HEART CATH AND CORONARY ANGIOGRAPHY N/A 06/15/2022   Procedure: LEFT HEART CATH AND CORONARY ANGIOGRAPHY;  Surgeon: [name] Candyce RAMAN, MD;  Location: [facility] INVASIVE CV LAB;  Service: Cardiovascular;  Laterality: N/A;   LEFT HEART CATH AND  CORONARY ANGIOGRAPHY N/A 03/10/2023   Procedure: LEFT HEART CATH AND CORONARY ANGIOGRAPHY;  Surgeon: [name] Sharper, MD;  [facility] INVAS[facility] CV LAB;  Service: Cardiovascular;  Laterality: N/A;   NASAL SINUS SURGERY  05/17/1973   PERCUTANEOUS CORONARY STENT INTERVENTION (PCI-S) N/A 05/03/2012   Procedure: PERCUTANEOUS CORONARY STENT INTERVENTION (PCI-S);  Surgeon: [name] Wonda, MD;  Location: [facility] CATH LAB;  Service: Cardiovascular;  Laterality: N/A;   Promus DES ok for 3T MRI  05/17/2006   Social History   Socioeconomic History   Marital status: Married    Spouse name: Not on file   Number of children: 3   Years of education: 16   Highest education level: Bachelor's degree (e.g., BA, AB, BS)  Occupational History   Occupation: businessman, Financial Planner: RETIRED  Tobacco Use   Smoking status: Never    Passive exposure: Never   Smokeless tobacco: Never  Vaping Use   Vaping status: Never Used  Substance and Sexual Activity   Alcohol use: Yes    Alcohol/week: 1.0 standard drink of alcohol    Types: 1 Shots of liquor per week    Comment: 3 per week   Drug use: No   Sexual activity: Yes  Partners: Female  Other Topics Concern   Not on file  Social History Narrative   HSG, Engineer, 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 He has two daughters, 1 in [place] 1 in [place] (Dec '12) and a son who works with him. Several grandchildren.Reports that he spends a good deal of time at his home in [facility] which he greatly enjoys and his home in the [place]. Enjoys driving his BMW [place] when [place].      ACP - Yes CPR, yes for short-term mechanical ventilation for reversible disease. Recommended [email or web address] for consideration.   Social Drivers of Health   Tobacco Use: Low Risk (05/31/2024)   Patient History    Smoking Tobacco Use: Never     Smokeless Tobacco Use: Never    Passive Exposure: Never  Financial Resource Strain: Low Risk (03/01/2024)   Overall Financial Resource Strain (CARDIA)    Difficulty of Paying Living Expenses: Not hard at all  Food Insecurity: No Food Insecurity (03/01/2024)   Epic    Worried About Programme Researcher, Broadcasting/film/video in the Last Year: Never true    Ran Out of Food in the Last Year: Never true  Transportation Needs: No Transportation Needs (03/01/2024)   Epic    Lack of Transportation (Medical): No    Lack of Transportation (Non-Medical): No  Physical Activity: Insufficiently Active (03/01/2024)   Exercise Vital Sign    Days of Exercise per Week: 3 days    Minutes of Exercise per Session: 40 min  Stress: No Stress Concern Present (03/01/2024)   [name] of Occupational Health - Occupational Stress Questionnaire    Feeling of Stress: Not at all  Social Connections: Unknown (03/01/2024)   Social Connection and Isolation Panel    Frequency of Communication with Friends and Family: Three times a week    Frequency of Social Gatherings with Friends and Family: Three times a week    Attends Religious Services: More than 4 times per year    Active Member of Clubs or Organizations: Not on file    Attends Club or Organization Meetings: Not on file    Marital Status: Married  Depression [phone]): Low Risk (02/01/2024)   Depression (PHQ2-9)    PHQ-2 Score: 2  Alcohol Screen: Low Risk (03/01/2024)   Alcohol Screen    Last Alcohol Screening Score (AUDIT): 3  Housing: Unknown (03/01/2024)   Epic    Unable to Pay for Housing in the Last Year: No    Number of Times Moved in the Last Year: Not on file    Homeless in the Last Year: Not on file  Utilities: Not At Risk (11/21/2023)   Epic    Threatened with loss of utilities: No  Health Literacy: Adequate Health Literacy (11/21/2023)   B1300 Health Literacy    Frequency of need for help with medical instructions: Never   Allergies[1] Family History   Problem Relation Age of Onset   Rheum arthritis Mother    Coronary artery disease Father    Heart attack Father    Cancer Brother        ? TYPE   Colon cancer Neg Hx    Stomach cancer Neg Hx    Pancreatic cancer Neg Hx     Current Outpatient Medications (Cardiovascular):    isosorbide  mononitrate (IMDUR ) 30 MG 24 hr tablet, Take 0.5 tablets (15 mg total) by mouth daily.   lisinopril  (ZESTRIL ) 10  MG tablet, TAKE 1 TABLET DAILY   metoprolol  succinate (TOPROL -XL) 25 MG 24 hr tablet, Take 1 tablet (25 mg total) by mouth daily.   nitroGLYCERIN  (NITROSTAT ) 0.4 MG SL tablet, Place 1 tablet (0.4 mg total) under the tongue every 5 (five) minutes as needed for chest pain.   rosuvastatin  (CRESTOR ) 40 MG tablet, Take 1 tablet (40 mg total) by mouth daily.  Current Outpatient Medications (Analgesics):    traMADol  (ULTRAM ) 50 MG tablet, Take 1 tablet (50 mg total) by mouth every 8 (eight) hours as needed for up to 5 days.  Current Outpatient Medications (Hematological):    clopidogrel  (PLAVIX ) 75 MG tablet, Take 1 tablet (75 mg total) by mouth daily.  Current Outpatient Medications (Other):    alfuzosin  (UROXATRAL ) 10 MG 24 hr tablet, Take 1 tablet (10 mg total) by mouth daily.   ALPRAZolam  (XANAX ) 0.25 MG tablet, TAKE ONE TABLET BY MOUTH DAILY AS NEEDED FOR ANXIETY( TAKE ONE TO TWO TABLETS 30 MINUTES PRIOR TO MRI)   Cholecalciferol (VITAMIN D3) 50 MCG (2000 UT) capsule, Take 2,000 Units by mouth daily.   hydrocortisone 2.5 % cream, SMARTSIG:sparingly Topical Twice Daily   lidocaine  (LIDODERM ) 5 %, Place 1 patch onto the skin daily. Remove & Discard patch within 12 hours or as directed by MD   Polyethyl Glycol-Propyl Glycol (SYSTANE ULTRA) 0.4-0.3 % SOLN, Place 1 drop into both eyes at bedtime.   psyllium (METAMUCIL) 58.6 % packet, Take 1 packet by mouth daily. (Patient taking differently: Take 1 packet by mouth daily. 2 tbsp a day)   tamsulosin  (FLOMAX ) 0.4 MG CAPS capsule, Take 0.8 mg by mouth  every morning.   triamcinolone  cream (KENALOG ) 0.1 %, as needed (rash).   Vibegron  (GEMTESA ) 75 MG TABS, Take 1 tablet (75 mg total) by mouth daily.   zolpidem  (AMBIEN ) 10 MG tablet, TAKE 1 TABLET(10 MG) BY MOUTH AT BEDTIME AS NEEDED FOR SLEEP.   Reviewed prior external information including notes and imaging from  primary care provider As well as notes that were available from care everywhere and other healthcare systems.  Past medical history, social, surgical and family history all reviewed in electronic medical record.  No pertanent information unless stated regarding to the chief complaint.   Review of Systems:  No headache, visual changes, nausea, vomiting, diarrhea, constipation, dizziness, abdominal pain, skin rash, fevers, chills, night sweats, weight loss, swollen lymph nodes, body aches, joint swelling, chest pain, shortness of breath, mood changes. POSITIVE muscle aches  Objective  Blood pressure 126/62, pulse 75, height 5' 9 (1.753 m), weight 142 lb (64.4 kg), SpO2 95%.   General: No apparent distress alert and oriented x3 mood and affect normal, dressed appropriately.  HEENT: Pupils equal, extraocular movements intact  Respiratory: Patient's speak in full sentences and does not appear short of breath  Cardiovascular: No lower extremity edema, non tender, no erythema  Left shoulder shows patient is holding it close to his body.  Still having discomfort and pain with any type of movement.  Worse than previous exam.  Rotator cuff now is weak compared to what it was previously.  2 out of 5 strength.  Difficult to do range of motion exercises secondary to severity of pain.  Limited muscular skeletal ultrasound was performed and interpreted by [name] HUSSAR, M  Limited ultrasound shows some hypoechoic changes of the left shoulder noted. Patient supraspinatus does have some hypoechoic changes at its insertion that is consistent with a potential high-grade tear noted.  Patient does  have  some underlying acromioclavicular arthritis also noted.  Still cortical irregularity noted of the lateral aspect of the humeral head.   Impression and Recommendations:    The above documentation has been reviewed and is accurate and complete [name] CHRISTELLA Sharps, DO        [1]  Allergies Allergen Reactions   Bactrim  [Sulfamethoxazole -Trimethoprim ]     Confusion, dizziness   "

## 2024-06-24 ENCOUNTER — Other Ambulatory Visit

## 2024-07-02 ENCOUNTER — Ambulatory Visit: Admitting: Family Medicine

## 2024-07-02 ENCOUNTER — Ambulatory Visit: Admitting: Cardiovascular Disease

## 2024-07-13 ENCOUNTER — Ambulatory Visit: Admitting: Urology

## 2024-07-25 ENCOUNTER — Ambulatory Visit: Admitting: Family Medicine

## 2024-11-21 ENCOUNTER — Ambulatory Visit
